# Patient Record
Sex: Female | Born: 1935 | Race: White | Hispanic: No | Smoking: Never smoker
Health system: Southern US, Community
[De-identification: ages and names within clinical notes are randomized; demographics above are authoritative.]

## PROBLEM LIST (undated history)

## (undated) DIAGNOSIS — I10 Essential (primary) hypertension: Secondary | ICD-10-CM

## (undated) DIAGNOSIS — M1990 Unspecified osteoarthritis, unspecified site: Secondary | ICD-10-CM

## (undated) DIAGNOSIS — K279 Peptic ulcer, site unspecified, unspecified as acute or chronic, without hemorrhage or perforation: Secondary | ICD-10-CM

## (undated) DIAGNOSIS — J302 Other seasonal allergic rhinitis: Secondary | ICD-10-CM

## (undated) DIAGNOSIS — M810 Age-related osteoporosis without current pathological fracture: Secondary | ICD-10-CM

## (undated) DIAGNOSIS — R569 Unspecified convulsions: Secondary | ICD-10-CM

## (undated) DIAGNOSIS — N10 Acute tubulo-interstitial nephritis: Secondary | ICD-10-CM

## (undated) DIAGNOSIS — R112 Nausea with vomiting, unspecified: Secondary | ICD-10-CM

## (undated) DIAGNOSIS — Z9889 Other specified postprocedural states: Secondary | ICD-10-CM

## (undated) DIAGNOSIS — E785 Hyperlipidemia, unspecified: Secondary | ICD-10-CM

## (undated) DIAGNOSIS — R252 Cramp and spasm: Secondary | ICD-10-CM

## (undated) DIAGNOSIS — D509 Iron deficiency anemia, unspecified: Secondary | ICD-10-CM

## (undated) DIAGNOSIS — R51 Headache: Secondary | ICD-10-CM

## (undated) DIAGNOSIS — Z9289 Personal history of other medical treatment: Secondary | ICD-10-CM

## (undated) DIAGNOSIS — D519 Vitamin B12 deficiency anemia, unspecified: Secondary | ICD-10-CM

## (undated) DIAGNOSIS — I639 Cerebral infarction, unspecified: Secondary | ICD-10-CM

## (undated) DIAGNOSIS — R519 Headache, unspecified: Secondary | ICD-10-CM

## (undated) DIAGNOSIS — E538 Deficiency of other specified B group vitamins: Secondary | ICD-10-CM

## (undated) HISTORY — PX: JOINT REPLACEMENT: SHX530

## (undated) HISTORY — PX: CATARACT EXTRACTION, BILATERAL: SHX1313

## (undated) HISTORY — DX: Hyperlipidemia, unspecified: E78.5

## (undated) HISTORY — PX: DILATION AND CURETTAGE OF UTERUS: SHX78

## (undated) HISTORY — DX: Peptic ulcer, site unspecified, unspecified as acute or chronic, without hemorrhage or perforation: K27.9

## (undated) HISTORY — DX: Deficiency of other specified B group vitamins: E53.8

## (undated) HISTORY — PX: THYROIDECTOMY, PARTIAL: SHX18

## (undated) HISTORY — DX: Unspecified osteoarthritis, unspecified site: M19.90

---

## 1997-09-30 ENCOUNTER — Other Ambulatory Visit: Admission: RE | Admit: 1997-09-30 | Discharge: 1997-09-30 | Payer: Self-pay | Admitting: Obstetrics and Gynecology

## 1999-01-11 ENCOUNTER — Other Ambulatory Visit: Admission: RE | Admit: 1999-01-11 | Discharge: 1999-01-11 | Payer: Self-pay | Admitting: Obstetrics and Gynecology

## 1999-12-26 ENCOUNTER — Encounter: Payer: Self-pay | Admitting: Obstetrics and Gynecology

## 1999-12-26 ENCOUNTER — Encounter: Admission: RE | Admit: 1999-12-26 | Discharge: 1999-12-26 | Payer: Self-pay | Admitting: Obstetrics and Gynecology

## 2000-02-21 ENCOUNTER — Other Ambulatory Visit: Admission: RE | Admit: 2000-02-21 | Discharge: 2000-02-21 | Payer: Self-pay | Admitting: Obstetrics and Gynecology

## 2001-03-05 ENCOUNTER — Other Ambulatory Visit: Admission: RE | Admit: 2001-03-05 | Discharge: 2001-03-05 | Payer: Self-pay | Admitting: Internal Medicine

## 2001-05-08 DIAGNOSIS — K279 Peptic ulcer, site unspecified, unspecified as acute or chronic, without hemorrhage or perforation: Secondary | ICD-10-CM

## 2001-05-08 HISTORY — DX: Peptic ulcer, site unspecified, unspecified as acute or chronic, without hemorrhage or perforation: K27.9

## 2002-01-16 ENCOUNTER — Inpatient Hospital Stay (HOSPITAL_COMMUNITY): Admission: EM | Admit: 2002-01-16 | Discharge: 2002-01-18 | Payer: Self-pay | Admitting: Emergency Medicine

## 2002-01-16 ENCOUNTER — Encounter: Payer: Self-pay | Admitting: Internal Medicine

## 2002-05-08 DIAGNOSIS — Z9289 Personal history of other medical treatment: Secondary | ICD-10-CM

## 2002-05-08 HISTORY — DX: Personal history of other medical treatment: Z92.89

## 2002-05-08 HISTORY — PX: TOTAL HIP ARTHROPLASTY: SHX124

## 2003-01-20 ENCOUNTER — Encounter: Payer: Self-pay | Admitting: Orthopedic Surgery

## 2003-01-26 ENCOUNTER — Inpatient Hospital Stay (HOSPITAL_COMMUNITY): Admission: RE | Admit: 2003-01-26 | Discharge: 2003-01-30 | Payer: Self-pay | Admitting: Orthopedic Surgery

## 2003-01-26 ENCOUNTER — Encounter: Payer: Self-pay | Admitting: Orthopedic Surgery

## 2004-01-18 ENCOUNTER — Other Ambulatory Visit: Admission: RE | Admit: 2004-01-18 | Discharge: 2004-01-18 | Payer: Self-pay | Admitting: Gynecology

## 2004-03-17 ENCOUNTER — Ambulatory Visit: Payer: Self-pay | Admitting: Gastroenterology

## 2004-03-18 ENCOUNTER — Ambulatory Visit: Payer: Self-pay | Admitting: Gastroenterology

## 2004-08-23 ENCOUNTER — Ambulatory Visit: Payer: Self-pay | Admitting: Internal Medicine

## 2004-08-30 ENCOUNTER — Ambulatory Visit: Payer: Self-pay | Admitting: Internal Medicine

## 2004-09-06 ENCOUNTER — Ambulatory Visit: Payer: Self-pay | Admitting: Internal Medicine

## 2005-01-26 ENCOUNTER — Encounter: Admission: RE | Admit: 2005-01-26 | Discharge: 2005-01-26 | Payer: Self-pay | Admitting: Orthopedic Surgery

## 2005-02-21 ENCOUNTER — Ambulatory Visit: Payer: Self-pay | Admitting: Internal Medicine

## 2005-11-17 ENCOUNTER — Ambulatory Visit: Payer: Self-pay | Admitting: Internal Medicine

## 2006-05-08 HISTORY — PX: ENDOMETRIAL ABLATION: SHX621

## 2006-08-09 ENCOUNTER — Other Ambulatory Visit: Admission: RE | Admit: 2006-08-09 | Discharge: 2006-08-09 | Payer: Self-pay | Admitting: Gynecology

## 2007-01-11 ENCOUNTER — Encounter: Payer: Self-pay | Admitting: Internal Medicine

## 2007-01-11 DIAGNOSIS — K250 Acute gastric ulcer with hemorrhage: Secondary | ICD-10-CM | POA: Insufficient documentation

## 2007-01-11 DIAGNOSIS — E785 Hyperlipidemia, unspecified: Secondary | ICD-10-CM

## 2007-01-11 DIAGNOSIS — D509 Iron deficiency anemia, unspecified: Secondary | ICD-10-CM | POA: Insufficient documentation

## 2007-01-11 DIAGNOSIS — K2980 Duodenitis without bleeding: Secondary | ICD-10-CM | POA: Insufficient documentation

## 2007-03-18 ENCOUNTER — Encounter: Payer: Self-pay | Admitting: Internal Medicine

## 2007-04-26 ENCOUNTER — Ambulatory Visit: Payer: Self-pay | Admitting: Internal Medicine

## 2007-04-26 DIAGNOSIS — K279 Peptic ulcer, site unspecified, unspecified as acute or chronic, without hemorrhage or perforation: Secondary | ICD-10-CM

## 2007-04-26 DIAGNOSIS — M1990 Unspecified osteoarthritis, unspecified site: Secondary | ICD-10-CM

## 2007-04-26 DIAGNOSIS — R51 Headache: Secondary | ICD-10-CM

## 2007-04-26 DIAGNOSIS — E559 Vitamin D deficiency, unspecified: Secondary | ICD-10-CM | POA: Insufficient documentation

## 2007-04-30 ENCOUNTER — Ambulatory Visit: Payer: Self-pay | Admitting: Cardiology

## 2007-04-30 LAB — CONVERTED CEMR LAB
Albumin: 3.7 g/dL (ref 3.5–5.2)
Alkaline Phosphatase: 55 units/L (ref 39–117)
BUN: 11 mg/dL (ref 6–23)
Basophils Absolute: 0 10*3/uL (ref 0.0–0.1)
Bilirubin Urine: NEGATIVE
Cholesterol: 195 mg/dL (ref 0–200)
Eosinophils Absolute: 0.3 10*3/uL (ref 0.0–0.6)
GFR calc non Af Amer: 85 mL/min
HCT: 36.9 % (ref 36.0–46.0)
HDL: 70 mg/dL (ref >39.0)
Hemoglobin, Urine: NEGATIVE
Hemoglobin: 12.8 g/dL (ref 12.0–15.0)
Ketones, ur: NEGATIVE mg/dL
LDL Cholesterol: 113 mg/dL — ABNORMAL HIGH (ref 0–99)
MCHC: 34.6 g/dL (ref 30.0–36.0)
MCV: 90.4 fL (ref 78.0–100.0)
Monocytes Absolute: 0.6 10*3/uL (ref 0.2–0.7)
Monocytes Relative: 10.5 % (ref 3.0–11.0)
Neutrophils Relative %: 63.7 % (ref 43.0–77.0)
Potassium: 4.4 meq/L (ref 3.5–5.1)
RBC: 4.08 M/uL (ref 3.87–5.11)
RDW: 12 % (ref 11.5–14.6)
Sodium: 140 meq/L (ref 135–145)
TSH: 1.89 microintl units/mL (ref 0.35–5.50)
Total CHOL/HDL Ratio: 2.8
Triglycerides: 62 mg/dL (ref 0–149)
Urobilinogen, UA: 0.2 (ref 0.0–1.0)
VLDL: 12 mg/dL (ref 0–40)
pH: 6.5 (ref 5.0–8.0)

## 2007-05-01 ENCOUNTER — Encounter: Payer: Self-pay | Admitting: Internal Medicine

## 2007-08-06 ENCOUNTER — Ambulatory Visit: Payer: Self-pay | Admitting: Internal Medicine

## 2007-08-06 DIAGNOSIS — J309 Allergic rhinitis, unspecified: Secondary | ICD-10-CM | POA: Insufficient documentation

## 2007-08-06 DIAGNOSIS — R0989 Other specified symptoms and signs involving the circulatory and respiratory systems: Secondary | ICD-10-CM | POA: Insufficient documentation

## 2007-08-22 ENCOUNTER — Ambulatory Visit: Payer: Self-pay

## 2007-10-24 ENCOUNTER — Encounter: Payer: Self-pay | Admitting: Internal Medicine

## 2007-11-07 ENCOUNTER — Ambulatory Visit (HOSPITAL_BASED_OUTPATIENT_CLINIC_OR_DEPARTMENT_OTHER): Admission: RE | Admit: 2007-11-07 | Discharge: 2007-11-07 | Payer: Self-pay | Admitting: Gynecology

## 2007-11-07 ENCOUNTER — Encounter (INDEPENDENT_AMBULATORY_CARE_PROVIDER_SITE_OTHER): Payer: Self-pay | Admitting: Gynecology

## 2007-12-18 ENCOUNTER — Telehealth: Payer: Self-pay | Admitting: Internal Medicine

## 2008-02-07 ENCOUNTER — Ambulatory Visit: Payer: Self-pay | Admitting: Internal Medicine

## 2008-02-07 DIAGNOSIS — H5710 Ocular pain, unspecified eye: Secondary | ICD-10-CM | POA: Insufficient documentation

## 2008-02-07 DIAGNOSIS — J329 Chronic sinusitis, unspecified: Secondary | ICD-10-CM

## 2008-02-25 ENCOUNTER — Encounter: Payer: Self-pay | Admitting: Internal Medicine

## 2008-04-07 ENCOUNTER — Ambulatory Visit: Payer: Self-pay | Admitting: Internal Medicine

## 2008-04-07 LAB — CONVERTED CEMR LAB
Basophils Relative: 0.4 % (ref 0.0–3.0)
Eosinophils Relative: 5.7 % — ABNORMAL HIGH (ref 0.0–5.0)
HCT: 35.7 % — ABNORMAL LOW (ref 36.0–46.0)
Hemoglobin: 12.4 g/dL (ref 12.0–15.0)
MCV: 89.7 fL (ref 78.0–100.0)
Monocytes Absolute: 0.6 10*3/uL (ref 0.1–1.0)
Monocytes Relative: 10.3 % (ref 3.0–12.0)
Neutro Abs: 3.9 10*3/uL (ref 1.4–7.7)
RBC: 3.99 M/uL (ref 3.87–5.11)
WBC: 5.9 10*3/uL (ref 4.5–10.5)

## 2008-04-08 ENCOUNTER — Ambulatory Visit: Payer: Self-pay | Admitting: Internal Medicine

## 2008-04-09 ENCOUNTER — Telehealth (INDEPENDENT_AMBULATORY_CARE_PROVIDER_SITE_OTHER): Payer: Self-pay | Admitting: *Deleted

## 2008-07-13 ENCOUNTER — Ambulatory Visit: Payer: Self-pay | Admitting: Internal Medicine

## 2008-08-06 ENCOUNTER — Encounter: Payer: Self-pay | Admitting: Internal Medicine

## 2008-09-11 ENCOUNTER — Ambulatory Visit: Payer: Self-pay | Admitting: Internal Medicine

## 2008-09-11 LAB — CONVERTED CEMR LAB
Albumin: 3.5 g/dL (ref 3.5–5.2)
Alkaline Phosphatase: 65 units/L (ref 39–117)
BUN: 15 mg/dL (ref 6–23)
Basophils Absolute: 0 10*3/uL (ref 0.0–0.1)
Basophils Relative: 0.7 % (ref 0.0–3.0)
CO2: 29 meq/L (ref 19–32)
Calcium: 9.3 mg/dL (ref 8.4–10.5)
Creatinine, Ser: 0.7 mg/dL (ref 0.4–1.2)
Eosinophils Absolute: 0.4 10*3/uL (ref 0.0–0.7)
Glucose, Bld: 87 mg/dL (ref 70–99)
Lymphocytes Relative: 17.3 % (ref 12.0–46.0)
MCHC: 34 g/dL (ref 30.0–36.0)
Neutrophils Relative %: 65.3 % (ref 43.0–77.0)
RBC: 4.14 M/uL (ref 3.87–5.11)
Sodium: 143 meq/L (ref 135–145)
TSH: 2.4 microintl units/mL (ref 0.35–5.50)
Total Protein: 6.9 g/dL (ref 6.0–8.3)
Vit D, 25-Hydroxy: 30 ng/mL (ref 30–89)
Vitamin B-12: 276 pg/mL (ref 211–911)

## 2008-09-16 ENCOUNTER — Ambulatory Visit: Payer: Self-pay | Admitting: Internal Medicine

## 2008-09-16 DIAGNOSIS — E538 Deficiency of other specified B group vitamins: Secondary | ICD-10-CM | POA: Insufficient documentation

## 2009-01-05 ENCOUNTER — Ambulatory Visit: Payer: Self-pay | Admitting: Internal Medicine

## 2009-02-08 ENCOUNTER — Ambulatory Visit: Payer: Self-pay | Admitting: Internal Medicine

## 2009-03-26 ENCOUNTER — Ambulatory Visit: Payer: Self-pay | Admitting: Internal Medicine

## 2009-03-26 LAB — CONVERTED CEMR LAB
Bilirubin, Direct: 0.1 mg/dL (ref 0.0–0.3)
Calcium: 9.4 mg/dL (ref 8.4–10.5)
Creatinine, Ser: 0.7 mg/dL (ref 0.4–1.2)
Total Bilirubin: 0.9 mg/dL (ref 0.3–1.2)
Vit D, 25-Hydroxy: 35 ng/mL (ref 30–89)

## 2009-04-07 ENCOUNTER — Ambulatory Visit: Payer: Self-pay | Admitting: Internal Medicine

## 2009-07-09 ENCOUNTER — Ambulatory Visit: Payer: Self-pay | Admitting: Internal Medicine

## 2009-07-09 DIAGNOSIS — R252 Cramp and spasm: Secondary | ICD-10-CM

## 2009-09-23 ENCOUNTER — Telehealth: Payer: Self-pay | Admitting: Internal Medicine

## 2009-10-08 ENCOUNTER — Ambulatory Visit: Payer: Self-pay | Admitting: Internal Medicine

## 2009-10-08 LAB — CONVERTED CEMR LAB
AST: 18 units/L (ref 0–37)
Albumin: 3.6 g/dL (ref 3.5–5.2)
Basophils Absolute: 0 10*3/uL (ref 0.0–0.1)
CO2: 28 meq/L (ref 19–32)
Chloride: 107 meq/L (ref 96–112)
HDL: 71 mg/dL (ref >39.00)
Hemoglobin: 11.9 g/dL — ABNORMAL LOW (ref 12.0–15.0)
LDL Cholesterol: 81 mg/dL (ref 0–99)
Lymphocytes Relative: 19.3 % (ref 12.0–46.0)
Monocytes Relative: 10.4 % (ref 3.0–12.0)
Neutro Abs: 3.2 10*3/uL (ref 1.4–7.7)
Potassium: 4.3 meq/L (ref 3.5–5.1)
RBC: 3.94 M/uL (ref 3.87–5.11)
RDW: 13.4 % (ref 11.5–14.6)
Sodium: 142 meq/L (ref 135–145)
TSH: 1.97 microintl units/mL (ref 0.35–5.50)
Total CHOL/HDL Ratio: 2
Total CK: 62 units/L (ref 7–177)
Triglycerides: 68 mg/dL (ref 0.0–149.0)

## 2009-11-09 ENCOUNTER — Ambulatory Visit: Payer: Self-pay | Admitting: Internal Medicine

## 2010-01-06 ENCOUNTER — Encounter: Payer: Self-pay | Admitting: Internal Medicine

## 2010-03-22 ENCOUNTER — Ambulatory Visit: Payer: Self-pay | Admitting: Internal Medicine

## 2010-03-22 LAB — CONVERTED CEMR LAB
Basophils Relative: 3.2 % — ABNORMAL HIGH (ref 0.0–3.0)
Eosinophils Relative: 6.5 % — ABNORMAL HIGH (ref 0.0–5.0)
GFR calc non Af Amer: 71.96 mL/min (ref >60)
Glucose, Bld: 93 mg/dL (ref 70–99)
HCT: 36.6 % (ref 36.0–46.0)
Hemoglobin: 12.4 g/dL (ref 12.0–15.0)
Lymphs Abs: 1.4 10*3/uL (ref 0.7–4.0)
Monocytes Relative: 8.9 % (ref 3.0–12.0)
Neutro Abs: 3.1 10*3/uL (ref 1.4–7.7)
Potassium: 4.4 meq/L (ref 3.5–5.1)
RBC: 4.06 M/uL (ref 3.87–5.11)
RDW: 12.8 % (ref 11.5–14.6)
Saturation Ratios: 25.5 % (ref 20.0–50.0)
Sodium: 138 meq/L (ref 135–145)
TSH: 2.37 microintl units/mL (ref 0.35–5.50)
WBC: 5.5 10*3/uL (ref 4.5–10.5)

## 2010-03-24 ENCOUNTER — Ambulatory Visit: Payer: Self-pay | Admitting: Internal Medicine

## 2010-03-25 ENCOUNTER — Ambulatory Visit: Payer: Self-pay | Admitting: Internal Medicine

## 2010-06-07 NOTE — Progress Notes (Signed)
Summary: Refill--Tramadol  Phone Note Refill Request Message from:  Fax from Pharmacy on Sep 23, 2009 10:43 AM  Refills Requested: Medication #1:  TRAMADOL HCL 50 MG  TABS 1 or2 two times a day  as needed  for pain Initial call taken by: Lucious Groves,  Sep 23, 2009 10:43 AM  Follow-up for Phone Call        ok 3 ref Follow-up by: Tresa Garter MD,  Sep 23, 2009 12:47 PM  Additional Follow-up for Phone Call Additional follow up Details #1::        done. Additional Follow-up by: Lucious Groves,  Sep 23, 2009 2:11 PM    Prescriptions: TRAMADOL HCL 50 MG  TABS (TRAMADOL HCL) 1 or2 two times a day  as needed  for pain  #60 x 3   Entered by:   Lucious Groves   Authorized by:   Tresa Garter MD   Signed by:   Lucious Groves on 09/23/2009   Method used:   Telephoned to ...       [facility] [address] (retail)       [address]       [place], [place]  [address]       Ph: [phone]       Fax: [phone]   RxID:   [id number]

## 2010-06-07 NOTE — Assessment & Plan Note (Signed)
Summary: 4 mo fu-oyu   Vital Signs:  Patient profile:   77 year old female Height:      65 inches Weight:      117 pounds BMI:     19.54 Temp:     97.6 degrees F oral Pulse rate:   76 / minute Pulse rhythm:   regular Resp:     16 per minute BP sitting:   130 / 74  (left arm) Cuff size:   regular  Vitals Entered By: Lanier Prude, CMA(AAMA) (March 24, 2010 10:14 AM) CC: 4 mo f/u Is Patient Diabetic? No   CC:  4 mo f/u.  History of Present Illness: The patient presents for a follow up of hypertension, OA, hyperlipidemia.. C/o LBP and leg pain at night   Current Medications (verified): 1)  Celebrex 100 Mg  Caps (Celecoxib) .Marland Kitchen.. 1 Once Daily Pc Prn 2)  Tramadol Hcl 50 Mg  Tabs (Tramadol Hcl) .Marland Kitchen.. 1 Or2 Two Times A Day  As Needed  For Pain 3)  Hydrocodone-Ibuprofen 7.5-200 Mg  Tabs (Hydrocodone-Ibuprofen) .... As Needed 4)  Zocor 40 Mg  Tabs (Simvastatin) .... Take 1 By Mouth Once Daily 6 Month Follow-Up Is Due in Sept. No Addtioanl Refills Until Appt 5)  Aspirin 325 Mg Tabs (Aspirin) .... 1/2    Once Daily 6)  Loratadine 10 Mg  Tabs (Loratadine) .... Once Daily 7)  Fluticasone Propionate 50 Mcg/act  Susp (Fluticasone Propionate) .... 2 Sprays Each Nostril Once Daily 8)  B-12 500 Mcg Tabs (Cyanocobalamin) .Marland Kitchen.. 1 By Mouth Qod 9)  Vitamin D3 1000 Unit  Tabs (Cholecalciferol) .... 2 Once Daily By Mouth 10)  Prevacid 30 Mg Cpdr (Lansoprazole) .... Once Daily 11)  Omega-3 1000 Mg Caps (Omega-3 Fatty Acids) .... Once Daily  Allergies (verified): No Known Drug Allergies  Past History:  Past Medical History: Last updated: 09/16/2008 Anemia-iron deficiency Hyperlipidemia Osteoarthritis   Dr Despina Hick Peptic ulcer disease 2003 Vit D def gyn Dr Nicholas Lose Allergic rhinitis Chronic sinusitis Vit B12 def  Social History: Last updated: 11/09/2009 Retired Married Never Smoked  Review of Systems  The patient denies vision loss, chest pain, syncope, and abdominal pain.     Physical Exam  General:  Well-developed,well-nourished,in no acute distress; alert,appropriate and cooperative throughout examination Nose:  External nasal examination shows no deformity or inflammation. Nasal mucosa are pink and moist without lesions or exudates. Mouth:  Oral mucosa and oropharynx without lesions or exudates.  Teeth in good repair. Neck:  No deformities, masses, or tenderness noted. Lungs:  Normal respiratory effort, chest expands symmetrically. Lungs are clear to auscultation, no crackles or wheezes. Heart:  Normal rate and regular rhythm. S1 and S2 normal without gallop, murmur, click, rub or other extra sounds. Abdomen:  Bowel sounds positive,abdomen soft and non-tender without masses, organomegaly or hernias noted. Msk:  No deformity or scoliosis noted of thoracic or lumbar spine.   Neurologic:  alert & oriented X3.   Skin:  Intact without suspicious lesions or rashes Psych:  Cognition and judgment appear intact. Alert and cooperative with normal attention span and concentration. No apparent delusions, illusions, hallucinations   Impression & Recommendations:  Problem # 1:  OSTEOARTHRITIS (ICD-715.90) Assessment Deteriorated Hold Simvastatin Her updated medication list for this problem includes:    Celebrex 100 Mg Caps (Celecoxib) .Marland Kitchen... 1 once daily pc prn    Tramadol Hcl 50 Mg Tabs (Tramadol hcl) .Marland Kitchen... 1 or2 two times a day  as needed  for pain    Hydrocodone-ibuprofen  7.5-200 Mg Tabs (Hydrocodone-ibuprofen) .Marland Kitchen... As needed    Aspirin 325 Mg Tabs (Aspirin) .Marland Kitchen... 1/2    once daily  Problem # 2:  VITAMIN B12 DEFICIENCY (ICD-266.2)  Problem # 3:  HYPERLIPIDEMIA (ICD-272.4) Assessment: Unchanged  Her updated medication list for this problem includes:    Zocor 40 Mg Tabs (Simvastatin) .Marland Kitchen... Take 1 by mouth once daily 6 month follow-up is due in sept. no addtioanl refills until appt  Problem # 4:  ANEMIA-IRON DEFICIENCY (ICD-280.9)  Her updated medication list  for this problem includes:    B-12 500 Mcg Tabs (Cyanocobalamin) .Marland Kitchen... 1 by mouth qod  Complete Medication List: 1)  Celebrex 100 Mg Caps (Celecoxib) .Marland Kitchen.. 1 once daily pc prn 2)  Tramadol Hcl 50 Mg Tabs (Tramadol hcl) .Marland Kitchen.. 1 or2 two times a day  as needed  for pain 3)  Hydrocodone-ibuprofen 7.5-200 Mg Tabs (Hydrocodone-ibuprofen) .... As needed 4)  Zocor 40 Mg Tabs (Simvastatin) .... Take 1 by mouth once daily 6 month follow-up is due in sept. no addtioanl refills until appt 5)  Aspirin 325 Mg Tabs (Aspirin) .... 1/2    once daily 6)  Loratadine 10 Mg Tabs (Loratadine) .... Once daily 7)  Fluticasone Propionate 50 Mcg/act Susp (Fluticasone propionate) .... 2 sprays each nostril once daily 8)  B-12 500 Mcg Tabs (Cyanocobalamin) .Marland Kitchen.. 1 by mouth qod 9)  Vitamin D3 1000 Unit Tabs (Cholecalciferol) .... 2 once daily by mouth 10)  Prevacid 30 Mg Cpdr (Lansoprazole) .... Once daily 11)  Omega-3 1000 Mg Caps (Omega-3 fatty acids) .... Once daily  Patient Instructions: 1)  Please schedule a follow-up appointment in 4 months well w/labs.   Orders Added: 1)  Est. Patient Level IV [91478]

## 2010-06-07 NOTE — Assessment & Plan Note (Signed)
Summary: 3 MO ROV /NWS  #   Vital Signs:  Patient profile:   77 year old female Weight:      123 pounds Temp:     97.4 degrees F oral Pulse rate:   97 / minute BP sitting:   142 / 68  (left arm)  Vitals Entered By: Tora Perches (July 09, 2009 1:13 PM) CC: f/u Is Patient Diabetic? No   CC:  f/u.  History of Present Illness: The patient presents for a follow up of hypertension, allergies, hyperlipidemia   Preventive Screening-Counseling & Management  Alcohol-Tobacco     Smoking Status: never  Current Medications (verified): 1)  Celebrex 100 Mg  Caps (Celecoxib) .Marland Kitchen.. 1 Once Daily Pc Prn 2)  Tramadol Hcl 50 Mg  Tabs (Tramadol Hcl) .Marland Kitchen.. 1 Or2 Two Times A Day  As Needed  For Pain 3)  Hydrocodone-Ibuprofen 7.5-200 Mg  Tabs (Hydrocodone-Ibuprofen) .... As Needed 4)  Zocor 40 Mg  Tabs (Simvastatin) .... Take 1 By Mouth Once Daily 6 Month Follow-Up Is Due in Sept. No Addtioanl Refills Until Appt 5)  Aspirin 325 Mg Tabs (Aspirin) .... 1/2    Once Daily 6)  Loratadine 10 Mg  Tabs (Loratadine) .... Once Daily 7)  Fluticasone Propionate 50 Mcg/act  Susp (Fluticasone Propionate) .... 2 Sprays Each Nostril Once Daily 8)  B-12 500 Mcg Tabs (Cyanocobalamin) .Marland Kitchen.. 1 By Mouth Qod 9)  Vitamin D3 1000 Unit  Tabs (Cholecalciferol) .... 2 Once Daily By Mouth 10)  Prevacid 30 Mg Cpdr (Lansoprazole) .... Once Daily 11)  Omega-3 1000 Mg Caps (Omega-3 Fatty Acids) .... Once Daily  Allergies (verified): No Known Drug Allergies  Past History:  Past Medical History: Last updated: 09/16/2008 Anemia-iron deficiency Hyperlipidemia Osteoarthritis   Dr Despina Hick Peptic ulcer disease 2003 Vit D def gyn Dr Nicholas Lose Allergic rhinitis Chronic sinusitis Vit B12 def  Family History: Last updated: 04/26/2007 Family History Hypertension  Social History: Last updated: 04/26/2007 Retired Married Never Smoked  Physical Exam  General:  Well-developed,well-nourished,in no acute distress;  alert,appropriate and cooperative throughout examination Nose:  External nasal examination shows no deformity or inflammation. Nasal mucosa are pink and moist without lesions or exudates. Mouth:  Oral mucosa and oropharynx without lesions or exudates.  Teeth in good repair. Lungs:  Normal respiratory effort, chest expands symmetrically. Lungs are clear to auscultation, no crackles or wheezes. Heart:  Normal rate and regular rhythm. S1 and S2 normal without gallop, murmur, click, rub or other extra sounds. Abdomen:  Bowel sounds positive,abdomen soft and non-tender without masses, organomegaly or hernias noted. Msk:  No deformity or scoliosis noted of thoracic or lumbar spine.   Neurologic:  alert & oriented X3.   Skin:  Intact without suspicious lesions or rashes Psych:  Cognition and judgment appear intact. Alert and cooperative with normal attention span and concentration. No apparent delusions, illusions, hallucinations   Impression & Recommendations:  Problem # 1:  VITAMIN B12 DEFICIENCY (ICD-266.2) Assessment Improved On prescription therapy   Problem # 2:  VITAMIN D DEFICIENCY (ICD-268.9) Assessment: Improved On prescription therapy   Problem # 3:  CRAMP OF LIMB (ICD-729.82) Assessment: New Hold Simvastatin  Problem # 4:  HYPERLIPIDEMIA (ICD-272.4) Assessment: Improved  Her updated medication list for this problem includes:    Zocor 40 Mg Tabs (Simvastatin) .Marland Kitchen... Take 1 by mouth once daily 6 month follow-up is due in sept. no addtioanl refills until appt  Complete Medication List: 1)  Celebrex 100 Mg Caps (Celecoxib) .Marland Kitchen.. 1 once daily  pc prn 2)  Tramadol Hcl 50 Mg Tabs (Tramadol hcl) .Marland Kitchen.. 1 or2 two times a day  as needed  for pain 3)  Hydrocodone-ibuprofen 7.5-200 Mg Tabs (Hydrocodone-ibuprofen) .... As needed 4)  Zocor 40 Mg Tabs (Simvastatin) .... Take 1 by mouth once daily 6 month follow-up is due in sept. no addtioanl refills until appt 5)  Aspirin 325 Mg Tabs (Aspirin)  .... 1/2    once daily 6)  Loratadine 10 Mg Tabs (Loratadine) .... Once daily 7)  Fluticasone Propionate 50 Mcg/act Susp (Fluticasone propionate) .... 2 sprays each nostril once daily 8)  B-12 500 Mcg Tabs (Cyanocobalamin) .Marland Kitchen.. 1 by mouth qod 9)  Vitamin D3 1000 Unit Tabs (Cholecalciferol) .... 2 once daily by mouth 10)  Prevacid 30 Mg Cpdr (Lansoprazole) .... Once daily 11)  Omega-3 1000 Mg Caps (Omega-3 fatty acids) .... Once daily  Patient Instructions: 1)  Please schedule a follow-up appointment in 3 months. 2)  BMP prior to visit, ICD-9: 3)  Hepatic Panel prior to visit, ICD-9: 4)  Lipid Panel prior to visit, ICD-9: 5)  TSH prior to visit, ICD-9: 6)  CBC w/ Diff prior to visit, ICD-9: 995.20 272.0 729.5 7)  B12 266.20  8)  CK

## 2010-06-07 NOTE — Assessment & Plan Note (Signed)
Summary: 3 MO ROV /NWS  #   Vital Signs:  Patient profile:   77 year old female Weight:      116 pounds (52.73 kg) Temp:     97.7 degrees F (36.50 degrees C) oral Pulse rate:   72 / minute Pulse rhythm:   regular Resp:     16 per minute BP sitting:   140 / 90  (left arm) Cuff size:   regular  Vitals Entered By: Lanier Prude, CMA(AAMA) (November 09, 2009 11:18 AM) CC: 3 mo Is Patient Diabetic? No Pain Assessment Patient in pain? no        CC:  3 mo.  History of Present Illness: The patient presents for a follow up of OA, hyperlipidemia, allergy, B12 def   Current Medications (verified): 1)  Celebrex 100 Mg  Caps (Celecoxib) .Marland Kitchen.. 1 Once Daily Pc Prn 2)  Tramadol Hcl 50 Mg  Tabs (Tramadol Hcl) .Marland Kitchen.. 1 Or2 Two Times A Day  As Needed  For Pain 3)  Hydrocodone-Ibuprofen 7.5-200 Mg  Tabs (Hydrocodone-Ibuprofen) .... As Needed 4)  Zocor 40 Mg  Tabs (Simvastatin) .... Take 1 By Mouth Once Daily 6 Month Follow-Up Is Due in Sept. No Addtioanl Refills Until Appt 5)  Aspirin 325 Mg Tabs (Aspirin) .... 1/2    Once Daily 6)  Loratadine 10 Mg  Tabs (Loratadine) .... Once Daily 7)  Fluticasone Propionate 50 Mcg/act  Susp (Fluticasone Propionate) .... 2 Sprays Each Nostril Once Daily 8)  B-12 500 Mcg Tabs (Cyanocobalamin) .Marland Kitchen.. 1 By Mouth Qod 9)  Vitamin D3 1000 Unit  Tabs (Cholecalciferol) .... 2 Once Daily By Mouth 10)  Prevacid 30 Mg Cpdr (Lansoprazole) .... Once Daily 11)  Omega-3 1000 Mg Caps (Omega-3 Fatty Acids) .... Once Daily  Allergies (verified): No Known Drug Allergies  Past History:  Past Medical History: Last updated: 09/16/2008 Anemia-iron deficiency Hyperlipidemia Osteoarthritis   Dr Despina Hick Peptic ulcer disease 2003 Vit D def gyn Dr Nicholas Lose Allergic rhinitis Chronic sinusitis Vit B12 def  Social History: Last updated: 11/09/2009 Retired Married Never Smoked  Social History: Reviewed history from 04/26/2007 and no changes required. Retired Married Never  Smoked  Review of Systems  The patient denies fever, weight gain, dyspnea on exertion, hemoptysis, and abdominal pain.    Physical Exam  General:  Well-developed,well-nourished,in no acute distress; alert,appropriate and cooperative throughout examination Eyes:  No corneal or conjunctival inflammation noted. EOMI. Perrla. Mouth:  Oral mucosa and oropharynx without lesions or exudates.  Teeth in good repair. Neck:  No deformities, masses, or tenderness noted. Lungs:  Normal respiratory effort, chest expands symmetrically. Lungs are clear to auscultation, no crackles or wheezes. Heart:  Normal rate and regular rhythm. S1 and S2 normal without gallop, murmur, click, rub or other extra sounds. Abdomen:  Bowel sounds positive,abdomen soft and non-tender without masses, organomegaly or hernias noted. Msk:  No deformity or scoliosis noted of thoracic or lumbar spine.   Neurologic:  alert & oriented X3.   Skin:  Intact without suspicious lesions or rashes Psych:  Cognition and judgment appear intact. Alert and cooperative with normal attention span and concentration. No apparent delusions, illusions, hallucinations   Impression & Recommendations:  Problem # 1:  HYPERLIPIDEMIA (ICD-272.4) Assessment Unchanged  Her updated medication list for this problem includes:    Zocor 40 Mg Tabs (Simvastatin) .Marland Kitchen... Take 1 by mouth once daily 6 month follow-up is due in sept. no addtioanl refills until appt  Labs Reviewed: SGOT: 18 (10/08/2009)   SGPT:  13 (10/08/2009)   HDL:71.00 (10/08/2009), 70.0 (04/26/2007)  LDL:81 (10/08/2009), 113 (04/54/0981)  Chol:166 (10/08/2009), 195 (04/26/2007)  Trig:68.0 (10/08/2009), 62 (04/26/2007)  Problem # 2:  ALLERGIC RHINITIS (ICD-477.9) Assessment: Deteriorated  Her updated medication list for this problem includes:    Loratadine 10 Mg Tabs (Loratadine) ..... Once daily    Fluticasone Propionate 50 Mcg/act Susp (Fluticasone propionate) .Marland Kitchen... 2 sprays each nostril  once daily - restart  Orders: Prescription Created Electronically [phone])  Problem # 3:  VITAMIN B12 DEFICIENCY (ICD-266.2) Assessment: Comment Only On prescription drug  therapy   Problem # 4:  VITAMIN D DEFICIENCY (ICD-268.9) Assessment: Comment Only On prescription drug  therapy   Problem # 5:  ANEMIA-IRON DEFICIENCY (ICD-280.9) Assessment: Unchanged  Her updated medication list for this problem includes:    B-12 500 Mcg Tabs (Cyanocobalamin) .Marland Kitchen... 1 by mouth qod  Complete Medication List: 1)  Celebrex 100 Mg Caps (Celecoxib) .Marland Kitchen.. 1 once daily pc prn 2)  Tramadol Hcl 50 Mg Tabs (Tramadol hcl) .Marland Kitchen.. 1 or2 two times a day  as needed  for pain 3)  Hydrocodone-ibuprofen 7.5-200 Mg Tabs (Hydrocodone-ibuprofen) .... As needed 4)  Zocor 40 Mg Tabs (Simvastatin) .... Take 1 by mouth once daily 6 month follow-up is due in sept. no addtioanl refills until appt 5)  Aspirin 325 Mg Tabs (Aspirin) .... 1/2    once daily 6)  Loratadine 10 Mg Tabs (Loratadine) .... Once daily 7)  Fluticasone Propionate 50 Mcg/act Susp (Fluticasone propionate) .... 2 sprays each nostril once daily 8)  B-12 500 Mcg Tabs (Cyanocobalamin) .Marland Kitchen.. 1 by mouth qod 9)  Vitamin D3 1000 Unit Tabs (Cholecalciferol) .... 2 once daily by mouth 10)  Prevacid 30 Mg Cpdr (Lansoprazole) .... Once daily 11)  Omega-3 1000 Mg Caps (Omega-3 fatty acids) .... Once daily  Patient Instructions: 1)  Please schedule a follow-up appointment in 4 months. 2)  BMP prior to visit, ICD-9: 3)  CBC w/ Diff prior to visit, ICD-9: 4)  Iron/TIBc 280.9 Prescriptions: FLUTICASONE PROPIONATE 50 MCG/ACT  SUSP (FLUTICASONE PROPIONATE) 2 sprays each nostril once daily  #16 Gram x 6   Entered and Authorized by:   Tresa Garter MD   Signed by:   Tresa Garter MD on 11/09/2009   Method used:   Print then Give to Patient   RxID:   [id number]

## 2010-06-07 NOTE — Assessment & Plan Note (Signed)
Summary: per stacey flu vac avp  stc  Nurse Visit   Vitals Entered By: Lamar Sprinkles, CMA (March 25, 2010 11:35 AM)  Allergies: No Known Drug Allergies  Immunizations Administered:  Influenza Vaccine # 1:    Vaccine Type: Fluvax MCR    Site: right deltoid    Mfr: Sanofi Pasteur    Dose: 0.5 ml    Route: IM    Given by: Lamar Sprinkles, CMA    Exp. Date: 11/05/2010    Lot #: [id number]    VIS given: 11/30/09 version given March 25, 2010.  Orders Added: 1)  Flu Vaccine 58yrs + MEDICARE PATIENTS [Q2039] 2)  Administration Flu vaccine - MCR [G0008]

## 2010-06-07 NOTE — Letter (Signed)
Summary: Beather Arbour MD  Beather Arbour MD   Imported By: Lennie Odor 01/18/2010 11:35:18  _____________________________________________________________________  External Attachment:    Type:   Image     Comment:   External Document

## 2010-07-20 ENCOUNTER — Other Ambulatory Visit: Payer: Self-pay

## 2010-07-22 ENCOUNTER — Encounter (INDEPENDENT_AMBULATORY_CARE_PROVIDER_SITE_OTHER): Payer: Self-pay | Admitting: *Deleted

## 2010-07-22 ENCOUNTER — Other Ambulatory Visit: Payer: Medicare Other

## 2010-07-22 ENCOUNTER — Other Ambulatory Visit: Payer: Self-pay | Admitting: Internal Medicine

## 2010-07-22 DIAGNOSIS — E785 Hyperlipidemia, unspecified: Secondary | ICD-10-CM

## 2010-07-22 DIAGNOSIS — N3000 Acute cystitis without hematuria: Secondary | ICD-10-CM

## 2010-07-22 DIAGNOSIS — Z0000 Encounter for general adult medical examination without abnormal findings: Secondary | ICD-10-CM

## 2010-07-22 DIAGNOSIS — K279 Peptic ulcer, site unspecified, unspecified as acute or chronic, without hemorrhage or perforation: Secondary | ICD-10-CM

## 2010-07-22 DIAGNOSIS — Z136 Encounter for screening for cardiovascular disorders: Secondary | ICD-10-CM

## 2010-07-22 LAB — CBC WITH DIFFERENTIAL/PLATELET
Eosinophils Relative: 8.2 % — ABNORMAL HIGH (ref 0.0–5.0)
HCT: 36 % (ref 36.0–46.0)
Hemoglobin: 12.2 g/dL (ref 12.0–15.0)
Lymphocytes Relative: 22.9 % (ref 12.0–46.0)
Lymphs Abs: 1.4 10*3/uL (ref 0.7–4.0)
Monocytes Relative: 10.8 % (ref 3.0–12.0)
Neutro Abs: 3.6 10*3/uL (ref 1.4–7.7)
WBC: 6.2 10*3/uL (ref 4.5–10.5)

## 2010-07-22 LAB — URINALYSIS, ROUTINE W REFLEX MICROSCOPIC
Bilirubin Urine: NEGATIVE
Nitrite: NEGATIVE
Specific Gravity, Urine: 1.025 (ref 1.000–1.030)
Total Protein, Urine: NEGATIVE
pH: 5.5 (ref 5.0–8.0)

## 2010-07-22 LAB — LIPID PANEL
Total CHOL/HDL Ratio: 3
Triglycerides: 63 mg/dL (ref 0.0–149.0)

## 2010-07-22 LAB — BASIC METABOLIC PANEL
CO2: 30 mEq/L (ref 19–32)
Calcium: 9.2 mg/dL (ref 8.4–10.5)
Chloride: 105 mEq/L (ref 96–112)
Creatinine, Ser: 0.7 mg/dL (ref 0.4–1.2)
Glucose, Bld: 79 mg/dL (ref 70–99)
Sodium: 139 mEq/L (ref 135–145)

## 2010-07-22 LAB — HEPATIC FUNCTION PANEL
ALT: 9 U/L (ref 0–35)
Albumin: 3.5 g/dL (ref 3.5–5.2)
Alkaline Phosphatase: 47 U/L (ref 39–117)
Bilirubin, Direct: 0.1 mg/dL (ref 0.0–0.3)
Total Protein: 6.4 g/dL (ref 6.0–8.3)

## 2010-07-27 ENCOUNTER — Ambulatory Visit (INDEPENDENT_AMBULATORY_CARE_PROVIDER_SITE_OTHER): Payer: Medicare Other | Admitting: Internal Medicine

## 2010-07-27 ENCOUNTER — Encounter: Payer: Self-pay | Admitting: Internal Medicine

## 2010-07-27 DIAGNOSIS — M1990 Unspecified osteoarthritis, unspecified site: Secondary | ICD-10-CM

## 2010-07-27 DIAGNOSIS — N3000 Acute cystitis without hematuria: Secondary | ICD-10-CM

## 2010-07-27 DIAGNOSIS — E785 Hyperlipidemia, unspecified: Secondary | ICD-10-CM

## 2010-07-27 DIAGNOSIS — E538 Deficiency of other specified B group vitamins: Secondary | ICD-10-CM

## 2010-07-27 DIAGNOSIS — K279 Peptic ulcer, site unspecified, unspecified as acute or chronic, without hemorrhage or perforation: Secondary | ICD-10-CM

## 2010-07-27 DIAGNOSIS — E782 Mixed hyperlipidemia: Secondary | ICD-10-CM

## 2010-07-27 DIAGNOSIS — R252 Cramp and spasm: Secondary | ICD-10-CM

## 2010-07-27 MED ORDER — TRAMADOL HCL 50 MG PO TABS: 50.0000 mg | ORAL_TABLET | Freq: Two times a day (BID) | ORAL | Status: DC | PRN

## 2010-07-27 MED ORDER — CIPROFLOXACIN HCL 250 MG PO TABS: 250.0000 mg | ORAL_TABLET | Freq: Two times a day (BID) | ORAL | Status: DC

## 2010-07-28 NOTE — Assessment & Plan Note (Signed)
Cont Rx 

## 2010-07-28 NOTE — Progress Notes (Signed)
  Subjective:    Patient ID: Theresa Hampton, female    DOB: [birth year], 76 y.o.   MRN: [id number]  HPI  The patient presents for a follow-up visit to check on  OA, hyperlipidemia, PUD   Review of Systems  Constitutional: Negative for appetite change and fatigue.  HENT: Negative for congestion.   Respiratory: Negative for cough.   Gastrointestinal: Negative for blood in stool.  Genitourinary: Negative for dysuria.  Neurological: Negative for facial asymmetry.  Psychiatric/Behavioral: The patient is not hyperactive.        Objective:   Physical Exam  Constitutional: She appears well-developed and well-nourished.  HENT:  Head: Normocephalic.  Eyes: Pupils are equal, round, and reactive to light. Left eye exhibits no discharge.  Pulmonary/Chest: No respiratory distress. She has no rales.  Abdominal: Soft. She exhibits no distension.  Lymphadenopathy:    She has no cervical adenopathy.  Skin: No rash noted.  Psychiatric: Her behavior is normal. Judgment normal.          Assessment & Plan:  VITAMIN B12 DEFICIENCY Cont Rx  PEPTIC ULCER DISEASE Cont Rx  OSTEOARTHRITIS Cont Rx.  HYPERLIPIDEMIA Cont Rx

## 2010-07-30 ENCOUNTER — Other Ambulatory Visit: Payer: Self-pay | Admitting: Internal Medicine

## 2010-08-01 NOTE — Telephone Encounter (Signed)
Please advise, ok for rf?

## 2010-08-03 NOTE — Telephone Encounter (Signed)
OK to fill this prescription with additional refills x3 Thank you!  

## 2010-09-20 NOTE — Op Note (Signed)
NAMECORENA, TILSON              ACCOUNT NO.:  [id number]   MEDICAL RECORD NO.:  [id number]          PATIENT TYPE:  AMB   LOCATION:  [facility]                         FACILITY:  [facility]   PHYSICIAN:  Gretta Cool, M.D. DATE OF BIRTH:  10/04/1919   DATE OF PROCEDURE:  11/07/2007  DATE OF DISCHARGE:                               OPERATIVE REPORT   PREOPERATIVE DIAGNOSIS:  Simple endometrial hyperplasia with atypia.   POSTOPERATIVE DIAGNOSIS:  Simple endometrial hyperplasia with atypia.   PROCEDURE:  1. Hysteroscopy resection of the endometrium.  2. Total plus VapoTrode ablation.   DESCRIPTION OF PROCEDURE:  Under excellent IV sedation with paracervical  block anesthesia, the cervix was grasped and dilated to accommodate the  7 mm resectoscope.  The cavity was then examined and there was no  evidence of polyp formation.  There was slight thickening of endometrium  for the patient's age on both anterior and posterior endometrial uterine  walls.  The cornual areas were first resected, then the entire  endometrial cavity was resected down into the muscle area of the uterus  -- for a depth of approximately 5 mm.  Once the entire endometrium was  excised, the cavity was then treated with the VapoTrode electrode so as  to eliminate any islands of superficial myometrium.  The cornual areas  were treated by touch technique.   At this point, the pressure was reduced and the bleeding points were  treated by cautery.  The patient was then returned to the recovery room  in excellent condition.   FLUID DEFICIT:  Approximately 200 mL.   COMPLICATIONS:  None.           ______________________________  Gretta Cool, M.D.     CWL/MEDQ  D:  11/07/2007  T:  11/07/2007  Job:  [id number]

## 2010-09-23 NOTE — H&P (Signed)
Theresa Hampton, Theresa Hampton                         ACCOUNT NO.:  [id number]   MEDICAL RECORD NO.:  [id number]                   PATIENT TYPE:   LOCATION:                                       FACILITY:   PHYSICIAN:  Georgina Quint. Plotnikov, M.D. [facility]      DATE OF BIRTH:  03-13-1920   DATE OF ADMISSION:  DATE OF DISCHARGE:                                HISTORY & PHYSICAL   CHIEF COMPLAINT:  Weak, passed out once, had black stool.   HISTORY OF PRESENT ILLNESS:  The patient is a 77 year old female who  presents with the above symptoms that developed on September 9.  She passed  out in the bathroom, hit her head a little bit on the right side.  She felt  dizzy and light-headed prior to it.  She has been nauseated for several days  without abdominal pain.  Has been feeling weak.  Admits to one black stool  two days ago.  No recurrence.  Denies chest pain.  Admits to some  palpitations.   PAST MEDICAL HISTORY:  Arthritis, osteoporosis, menopause.   ALLERGIES:  None.   PAST SURGICAL HISTORY:  Thyroidectomy, polypectomy, colonoscopy.   CURRENT MEDICATIONS:  She was on a hormone patch recently which she  discontinued.  She takes Celebrex occasionally.  Fosamax weekly.   SOCIAL HISTORY:  She is married.  Has grown children.  Does not smoke.   FAMILY HISTORY:  Father has a mild age of 23 and colon cancer.  Mother died  age of 5 with old age.   REVIEW OF SYMPTOMS:  Palpitations as above.  Negative for chest pain.  No  abdominal complaints except for one black stool.  No syncope previously.  The rest is negative.   PHYSICAL EXAMINATION:  GENERAL:  She appears mildly ill.  She is in no acute  distress.  She is pale.  VITAL SIGNS:  Blood pressure 110/74, pulse 128, temperature 99.  HEENT:  Moist mucosa.  NECK:  Supple.  No thyromegaly or bruit.  LUNGS:  Clear breath sounds.  No wheezes or rales.  HEART:  S1, S2.  No ___________ to percussion.  Tachycardic.  No gallop.  ABDOMEN:  Soft,  nontender.  No organomegaly or mass.  EXTREMITIES:  Lower extremities without edema.  NEUROLOGIC:  Cranial nerves II-XII normal.  __________ strength normal.  She  is alert and cooperative and has been depressed.  SKIN:  She has mild ecchymosis from the right cheekbone.   LABORATORIES:  EKG with sinus tachycardia, heart rate 120.   PROCEDURE:  Anoscopy.   INDICATIONS:  Melena.   PROCEDURE:  She was placed in the right decubitus position.  Digital  examination reveals dark stool which tested guaiac positive.  No masses,  some external hemorrhoids.  Anoscope was introduced without difficulty.  Upon withdrawal careful look at the  mucosa was obtained.  Some internal  hemorrhoids were observed.  No bleeding.   COMPLICATIONS:  None.  IMPRESSION:  Internal hemorrhoids, guaiac positive black stool.   ASSESSMENT/PLAN:  1. Syncope likely due to orthostatic hypotension due to probable     gastrointestinal bleeding.  Admit to telemetry.  Obtain CPKs x3, troponin     x3, EKG in the morning.  2. Melena likely due to upper gastrointestinal bleed.  She had a colonoscopy     a couple years ago.  Obtain gastrointestinal consult today.  Start on     Protonix 40 mg q.d.  Obtain CBC __________.  3. Weakness.  Plan as above.  Intravenous fluids starting with normal     saline.  4. Tachycardia.  Plan as above.                                               Georgina Quint. Plotnikov, M.D. [facility]    AVP/MEDQ  D:  01/16/2002  T:  01/16/2002  Job:  [id number]   cc:   Ulyess Mort, M.D. [facility]   Gretta Cool, M.D.  [address] [place]  [place] [address]  Fax: [phone]

## 2010-09-23 NOTE — Op Note (Signed)
NAME:  Theresa Hampton, Theresa Hampton                        ACCOUNT NO.:  [id number]   MEDICAL RECORD NO.:  [id number]                   PATIENT TYPE:  INP   LOCATION:  0008                                 FACILITY:  [facility]   PHYSICIAN:  Ollen Gross, M.D.                 DATE OF BIRTH:  [birth year]   DATE OF PROCEDURE:  01/26/2003  DATE OF DISCHARGE:                                 OPERATIVE REPORT   PREOPERATIVE DIAGNOSIS:  Osteoarthritis, right hip.   POSTOPERATIVE DIAGNOSIS:  Osteoarthritis, right hip.   PROCEDURE:  Right total hip arthroplasty.   SURGEON:  Ollen Gross, M.D.   ASSISTANT:  Alexzandrew L. Julien Girt, P.A.   ANESTHESIA:  Spinal.   ESTIMATED BLOOD LOSS:  600.   DRAINS:  Hemovac x1.   COMPLICATIONS:  None.   CONDITION:  Stable to recovery.   CLINICAL NOTE:  Theresa Hampton is an 77 year old female with end-stage  osteoarthritis of the right hip with pain refractory to nonoperative  management.  She presents now for a right total hip arthroplasty.   PROCEDURE IN DETAIL:  After the successful administration of spinal  anesthetic, the patient is placed in the left lateral decubitus position  with the right side up and held with the hip positioner.  The right lower  extremity was isolated from the perineum with plastic drapes and prepped and  draped in the usual sterile fashion.  A mini-posterolateral incision was  made with a 10 blade through subcutaneous tissue to the level of the fascia  lata, which is incised in line with the skin incision.  The sciatic nerve is  palpated and protected and the short rotators isolated off the femur.  Capsulectomy is performed and the hip is dislocated.  The center of the  femoral head is marked and a trial prosthesis is placed such that the center  of the trial head corresponds to the center of her native femoral head.  Osteotomy lines are marked on the femoral neck and osteotomy is made with an  oscillating saw.   The femur is  retracted anteriorly and an acetabular exposure obtained.  The  anterior labrum and capsule were removed and then osteophytes removed.  Reaming started with a 47, coursing in increments to the 51, then a 52 mm  Pinnacle acetabular shell is placed in anatomic position and transfixed with  two domed screws.  The trial 32 neutral liner is placed.   Femoral preparation is initiated with the canal finder and irrigation.  I  broached up to a size 1, then placed a size 2 high-offset neck on the broach  with a 3 mm +1 head.  The hip is reduced with outstanding stability, full  extension, flexion, and rotation, 70 degrees flexion and 40 degrees  adduction, 90 degrees internal rotation, and 90 degrees flexion and 70  degrees internal rotation.  I placed the right leg over the left and it  felt  as though leg lengths were equal.  The hip is dislocated, all trials  removed, and the permanent apex hole eliminator and permanent 32 mm neutral  Marathon liner were placed.  I trialled the cement restrictor size, and a 3  was most appropriate.  I placed a size 3 cement restrictor at the  appropriate depth in the femoral canal.  The canal was then thoroughly  irrigated with the pulsatile lavage and cement mixed.  Once ready for  implantation, the cement is injected into the canal and pressurized.  A size  2 Endurance Luster high-offset stem is placed matching her native  anteversion.  Once the cement is fully hardened, then we placed a 32 +1 head  and reduced the hip.  She had outstanding stability throughout just like we  had on the trial.  The wound was then copiously irrigated with antibiotic  solution and short rotators reattached to the femur with drill holes.  The  fascia lata is closed over a Hemovac drain with interrupted #1 Vicryl, the  subcu closed with #1 and 2-0 Vicryl, and  subcuticular running 4-0 Monocryl.  Incision was cleaned and dried and Steri-Strips and a bulky sterile dressing  applied.   The drain is hooked to suction.  She is placed into a knee  immobilizer, awakened, and transported to recovery in stable condition.                                               Ollen Gross, M.D.    FA/MEDQ  D:  01/26/2003  T:  01/26/2003  Job:  [id number]

## 2010-09-23 NOTE — Discharge Summary (Signed)
NAME:  Theresa Hampton, Theresa Hampton                        ACCOUNT NO.:  [id number]   MEDICAL RECORD NO.:  [id number]                   PATIENT TYPE:  INP   LOCATION:  0469                                 FACILITY:  [facility]   PHYSICIAN:  Ollen Gross, M.D.                 DATE OF BIRTH:  1919-09-07   DATE OF ADMISSION:  01/26/2003  DATE OF DISCHARGE:  01/30/2003                                 DISCHARGE SUMMARY   ADMITTING DIAGNOSES:  1. Osteoarthritis right hip.  2. History of gastrointestinal bleed secondary to ulcers.   DISCHARGE DIAGNOSES:  1. Osteoarthritis right hip status post right total hip arthroplasty.  2. Mild hypokalemia, improved.  3. Mild hyponatremia, improved.  4. Past history of gastrointestinal bleed secondary to ulcers.   PROCEDURE:  Date of surgery January 26, 2003.  Right total hip  arthroplasty.  Surgeon Ollen Gross, M.D.  Assistant Alexzandrew L.  Julien Girt, P.A.  Spinal anesthesia.  Estimated blood loss 600 mL.  Hemovac  drain x1.   BRIEF HISTORY:  An 77 year old female with end-stage osteoarthritis of the  right hip.  She has been seen and treated by Ollen Gross, M.D.  She has  been treated with nonoperative management, however, has continued to have  pain.  She is known to have end-stage arthritis.  Felt she would benefit  from undergoing surgery.  Risks and benefits discussed.  The patient  subsequently admitted to the hospital.   CONSULTS:  Rehabilitation services, Ranelle Oyster, M.D.   LABORATORY DATA:  CBC on admission:  Hemoglobin 11.5, hematocrit 33.6, white  cell count 6.1, red cell count 3.71.  Differential all within normal limits.  Postoperative H&H 11.0 and 31.9.  Last noted H&H 9.4 and 26.8.  PT/PTT on  admission were 12.9 and 37, respectively with an INR of 1.0.  Serial pro  times followed.  Last noted PT/INR 24.5 and 3.1.  Chemistry panel on  admission:  Mildly elevated glucose of 127.  Remaining chemistry panel all  within normal limits.   Serial BMETs were followed.  Sodium and potassium  dropped to 134 and 3.4, respectively.  Came back up to 136 and 4.3,  respectively.  Calcium dropped from 9.0 to 7.6.  Urinalysis on admission  cloudy, otherwise negative.  Blood group type AB+.   EKG January 20, 2003:  Normal sinus rhythm, left axis deviation.  No old  tracing to compare.  Confirmed by Francisca December, M.D.  Right hip films  January 20, 2003:  Severe right hip osteoarthritis, severe degenerative  joint disease space narrowing left hip, degenerative disk disease and  vertebral changes L5-S1, vascular calcification.  Chest x-ray two views  January 20, 2003:  No active disease.  Postoperative hip/pelvis films:  Good AP position right total hip prosthesis.   HOSPITAL COURSE:  The patient was admitted to [facility], taken to  the OR, underwent above stated procedure without  complications.  The patient  tolerated procedure well.  Later transferred to recovery room, then to the  orthopedic floor for continued postoperative care.  Vital signs were  followed.  The patient was given 24 hours of postoperative IV antibiotics in  the form of Ancef.  Placed on Coumadin for protocol.  Placed partial  weightbearing 50% to the right lower extremity.  PT and OT were consulted to  assist with gait training, ambulation, and ADLs.  Hemovac drain placed at  time of surgery was pulled on postoperative day one.  The patient underwent  a rehabilitation consult by Ranelle Oyster, M.D.  Seen and evaluated and  felt as long as she progressed well she may be able to go home.  She was  initially placed on PC analgesics for pain control, weaned over to p.o.  medications by day two.  Dressing change initiated postoperative day two.  Incision was healing well.  Laboratories were followed very closely.  The  patient actually did progress very well with physical therapy up ambulating  approximately 6 feet by day two, 20 feet later that  afternoon, 80 feet by  the following day, and 90 feet by postoperative day four.  She continued to  do well and by day four she was tolerating p.o. medications, up ambulating.  Arrangements have been made for home care.  Was discharged home.   DISCHARGE PLAN:  The patient is discharged home on January 30, 2003.   DISCHARGE DIAGNOSES:  Please see above.   DISCHARGE MEDICATIONS:  1. Colace.  2. Trinsicon.  3. Coumadin.  4. Percocet.  5. Robaxin.   DIET:  As tolerated.   FOLLOWUP:  Follow up two weeks from surgery.  Call the office for an  appointment [phone].   ACTIVITY:  Partial weightbearing 50% right lower extremity.  Total hip  arthroplasty hip precautions.  Home health PT, home health nursing through  [facility].  May start showering.   DISPOSITION:  Home.   CONDITION ON DISCHARGE:  Improved.     Alexzandrew L. Julien Girt, P.A.              Ollen Gross, M.D.    ALP/MEDQ  D:  03/04/2003  T:  03/04/2003  Job:  [id number]   cc:   Georgina Quint. Plotnikov, M.D. [facility]

## 2010-09-23 NOTE — Discharge Summary (Signed)
NAME:  Theresa Hampton, Theresa Hampton                        ACCOUNT NO.:  [id number]   MEDICAL RECORD NO.:  [id number]                   PATIENT TYPE:  INP   LOCATION:  0351                                 FACILITY:  [facility]   PHYSICIAN:  Georgina Quint. Plotnikov, M.D. [facility]      DATE OF BIRTH:  [birth year]   DATE OF ADMISSION:  01/16/2002  DATE OF DISCHARGE:  01/18/2002                                 DISCHARGE SUMMARY   FINAL DIAGNOSES:  1. Upper gastrointestinal bleeding due to problem #2.  2. Gastric ulcers plus duodenal ulcers/Helicobacter pylori positive.  3. Anemia due to problem #1.  4. Syncope due to problem #1.  5. Slightly abnormal chest x-ray, followup PA and lateral recommended.   PROCEDURE:  1. Upper endoscopy, Dr. Russella Dar, January 16, 2002.  2. Blood transfusion.   CONSULTATIONS:  GI consultation, Dr. Russella Dar.   DISCHARGE MEDICATIONS:  1. Prevpac for 10 days as directed.  2. Prevacid 30 mg one a day in the morning to start after Prevpac.  3. Chromagen start in three weeks.  4. Fosamax restart in one month.  5. Darvocet-N 100 q.i.d. p.r.n. arthritis pain.   SPECIAL INSTRUCTIONS:  Stop Celebrex.   FOLLOW UP PLANS:  1. Followup with Dr. Russella Dar in two months.  2. Dr. Posey Rea in two weeks.   HISTORY:  The patient is a 77 year old female who presents with weakness,  syncope, and melena on January 16, 2002 to the office and was admitted  from there.  For the details, please address the admission History and  Physical.   HOSPITAL COURSE:  During the course of hospitalization, the patient was seen  by Dr. Russella Dar.  She underwent endoscopy and was found to have multiple ulcers  in the stomach and duodenum.  She was started on IV Protonix.  The biopsy  came back with H. pylori positive results.  She was found to have a low  hemoglobin and she was transfused with packed red blood cells.  On the day  of discharge, her temp was 97.1, respirations 20, heart rate 83, blood  pressure 132/63.   She was feeling much better. HEENT with moist mucosa, no  pallor.  Neck supple.  Lungs clear, no wheeze or rales.  Heart with S1 & S2,  normal gallop, no murmur.  Abdomen soft, nontender, no organomegaly, no mass  felt.  Lower extremities without edema.  She is alert, oriented, and  cooperative.   LABORATORY AND ACCESSORY DATA:  H. pylori biopsy positive.  Hemoglobin 11.1  (was 8.1).  BMET normal.   Endoscopy on January 16, 2002 showed acute gastric ulcers varying 4-6 mm  in size, three prepyloric ulcers, clean-based duodenal ulcers 3 mm with  clear base, no active hemorrhage.  EKG in the office revealed sinus  tachycardia.  CT head with __________ .  Chest x-ray:  Probable bony density  overlying left anterior rib with recommended followup x-ray.  Georgina Quint. Plotnikov, M.D. [facility]    AVP/MEDQ  D:  01/18/2002  T:  01/18/2002  Job:  [id number]   cc:   Venita Lick. Pleas Koch., M.D. [facility]

## 2010-09-23 NOTE — H&P (Signed)
NAME:  Theresa Hampton, Theresa Hampton                        ACCOUNT NO.:  [id number]   MEDICAL RECORD NO.:  [id number]                   PATIENT TYPE:  INP   LOCATION:  NA                                   FACILITY:  [facility]   PHYSICIAN:  Ollen Gross, M.D.                 DATE OF BIRTH:  11/11/1919   DATE OF ADMISSION:  01/26/2003  DATE OF DISCHARGE:                                HISTORY & PHYSICAL   CHIEF COMPLAINT:  Right hip pain.   HISTORY OF PRESENT ILLNESS:  The patient is an 77 year old female whose been  seen by Dr. Lequita Halt for right hip pain. She is a previous patient of Dr. Worthy Rancher and follows up with Dr. Lequita Halt for severe pain in the right hip and  leg. It has been ongoing for several months now and progressively gotten  worse. The pain is throughout the thigh and even radiates down to the knee.  She is seen in the office and found to have some degenerative disk disease  in the low back and some spondylosis; however, on her AP and right hip  films, they do show severe end-stage arthritis with a slight protrusio  deformity. She was recommended to Dr. Lequita Halt by several friends. She is  seen in the office and felt that at this point due to her level of pain and  the significant findings on her x-rays that she would be an appropriate  candidate for hip replacement. The risks and benefits of this procedure have  been discussed with the patient and her husband and she elected to proceed  with surgery. The patient states that she has an appointment with Dr. Sonda Primes for preop evaluation, that evaluation is pending at the time of  dictation.   ALLERGIES:  No known drug allergies.   CURRENT MEDICATIONS:  1. Feagin daily.  2. Nexium daily.  3. Celebrex daily stopped prior to surgery.  4. Zocor daily.  5. Fosamax weekly.  6. Climara hormone patch weekly.  7. Aspirin daily, stop prior to surgery.  8. Complications essential women's daily vitamins, stop prior to surgery.   PAST MEDICAL HISTORY:  1. Osteoarthritis.  2. 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  3. Past history of gastrointestinal ulcers with bleed.   PAST SURGICAL HISTORY:  Partial thyroidectomy in 1958 and also she has  undergone EGD scopes x3   SOCIAL HISTORY:  Married, retired, nonsmoker, no alcohol, has two children.  Her husband will be assisting with her care after surgery. Two story home.  She states her bedroom is on the first floor.   FAMILY HISTORY:  Both parents with arthritis. She has one sister with colon  cancer and one sister with vaginal cancer, one of her sisters age 22 with a  history of stroke.   REVIEW OF SYMPTOMS:  GENERAL:  No fever, chills or night sweats. NEUROLOGIC:  No seizure,  syncope, paralysis. RESPIRATORY:  No shortness of breath,  productive cough or hemoptysis. CARDIOVASCULAR:  No chest pain, angina,  orthopnea. GI:  Past history of gastrointestinal bleed, no recent nausea,  vomiting, diarrhea or constipation. No recent blood or mucus in the stools.  GU:  No dysuria, hematuria or discharge. MUSCULOSKELETAL:  Pertinent to that  of the right hip found in the history of present illness.   PHYSICAL EXAMINATION:  VITAL SIGNS:  Pulse 84, respirations 12, blood  pressure 122/78.  GENERAL:  The patient is a 77 year old, thin female, well-nourished, well-  developed, in no acute distress. She is alert, oriented and cooperative,  very pleasant at the time of exam. Appears to be a good historian. She is  accompanied by her husband.  HEENT:  Normocephalic, atraumatic. Pupils are round and reactive. EOM's are  intact. Oropharynx is clear and neck is supple. No carotid bruits are  appreciated. She does have a thyroid scar noted at the base of the neck.  CHEST:  Clear to auscultation anterior and posterior chest walls.  HEART:  Regular rate and rhythm, no murmur, S1, S2 noted.  ABDOMEN:  Soft, flat, nontender, bowel sounds are present.  RECTAL/BREASTS/GENITALIA:  Not  done not pertinent to present illness.  EXTREMITIES:  Significant to that of the right hip. She has hip flexion of  about 85 degrees, internal rotation and external rotation are only 5 and 10  respectively. Abduction of about 20 degrees. There is pain with rotation and  flexion of the hip. Motor function is intact.   IMPRESSION:  1. Osteoarthritis right hip.  2. Past history of gastrointestinal bleed secondary to ulcers.   PLAN:  The patient will be admitted to [facility] to undergo right  total hip replacement arthroplasty. Surgery will be performed by Ollen Gross, M.D. The patient's medical physician is Georgina Quint. Plotnikov, M.D.  Dr. Posey Rea will be notified of the room number and admission and will be  consulted if needed for any medical assistance with this patient throughout  the hospital course.     Alexzandrew L. Julien Girt, P.A.              Ollen Gross, M.D.    ALP/MEDQ  D:  01/21/2003  T:  01/21/2003  Job:  [id number]   cc:   Georgina Quint. Plotnikov, M.D. [facility]

## 2010-09-23 NOTE — Assessment & Plan Note (Signed)
[facility]                             PRIMARY CARE OFFICE NOTE   NAME:Cordon, NEKAYLA HEIDER                     MRN:          [id number]  DATE:11/17/2005                            DOB:          1919-09-01    PROCEDURE:  Skin biopsy.   INDICATION:  Changing moles with irregular color and irritation.   Risks including incomplete procedure, scar formation, bleeding, infection,  others, as well as benefits were explained to the patient in detail.  She  was placed in decubitus position.  The area at the base of the right neck  was prepped with Betadine and alcohol.  Mole #1, measuring 11 mm, was  injected 0.5 mL of 2% lidocaine with epinephrine.  Shave biopsy was  obtained.  The specimen was sent to the lab (not complete).  The rest of the  lesion was destroyed with a Hyfrecator.  Tolerated well, complications none.   Mole #2 measuring 11 mm at the base of the right neck was removed in an  identical fashion.   Mole #3 measuring 12 mm at the base of the right neck was removed in  identical fashion.   Several other moles were removed from both sides.  Biopsy specimens not  sent.                                   Sonda Primes, MD   AP/MedQ  DD:  11/20/2005  DT:  11/20/2005  Job #:  [id number]

## 2010-10-26 ENCOUNTER — Ambulatory Visit: Payer: PRIVATE HEALTH INSURANCE | Admitting: Internal Medicine

## 2010-10-27 ENCOUNTER — Other Ambulatory Visit (INDEPENDENT_AMBULATORY_CARE_PROVIDER_SITE_OTHER): Payer: Medicare Other | Admitting: Internal Medicine

## 2010-10-27 ENCOUNTER — Ambulatory Visit (INDEPENDENT_AMBULATORY_CARE_PROVIDER_SITE_OTHER): Payer: Medicare Other | Admitting: Internal Medicine

## 2010-10-27 ENCOUNTER — Other Ambulatory Visit (INDEPENDENT_AMBULATORY_CARE_PROVIDER_SITE_OTHER): Payer: Medicare Other

## 2010-10-27 ENCOUNTER — Encounter: Payer: Self-pay | Admitting: Internal Medicine

## 2010-10-27 ENCOUNTER — Telehealth: Payer: Self-pay | Admitting: Internal Medicine

## 2010-10-27 DIAGNOSIS — R0989 Other specified symptoms and signs involving the circulatory and respiratory systems: Secondary | ICD-10-CM

## 2010-10-27 DIAGNOSIS — M6281 Muscle weakness (generalized): Secondary | ICD-10-CM

## 2010-10-27 DIAGNOSIS — K250 Acute gastric ulcer with hemorrhage: Secondary | ICD-10-CM

## 2010-10-27 DIAGNOSIS — E559 Vitamin D deficiency, unspecified: Secondary | ICD-10-CM

## 2010-10-27 DIAGNOSIS — M25539 Pain in unspecified wrist: Secondary | ICD-10-CM

## 2010-10-27 DIAGNOSIS — R29898 Other symptoms and signs involving the musculoskeletal system: Secondary | ICD-10-CM

## 2010-10-27 DIAGNOSIS — M1990 Unspecified osteoarthritis, unspecified site: Secondary | ICD-10-CM

## 2010-10-27 DIAGNOSIS — E782 Mixed hyperlipidemia: Secondary | ICD-10-CM

## 2010-10-27 DIAGNOSIS — M25531 Pain in right wrist: Secondary | ICD-10-CM

## 2010-10-27 DIAGNOSIS — Z0000 Encounter for general adult medical examination without abnormal findings: Secondary | ICD-10-CM

## 2010-10-27 LAB — URINALYSIS, ROUTINE W REFLEX MICROSCOPIC
Bilirubin Urine: NEGATIVE
Hgb urine dipstick: NEGATIVE
Ketones, ur: NEGATIVE
Total Protein, Urine: NEGATIVE
Urine Glucose: NEGATIVE

## 2010-10-27 LAB — HEPATIC FUNCTION PANEL
ALT: 13 U/L (ref 0–35)
AST: 17 U/L (ref 0–37)
Alkaline Phosphatase: 52 U/L (ref 39–117)
Total Bilirubin: 0.5 mg/dL (ref 0.3–1.2)

## 2010-10-27 LAB — URIC ACID: Uric Acid, Serum: 4.6 mg/dL (ref 2.4–7.0)

## 2010-10-27 LAB — LIPID PANEL
LDL Cholesterol: 103 mg/dL — ABNORMAL HIGH (ref 0–99)
Total CHOL/HDL Ratio: 3
VLDL: 10.2 mg/dL (ref 0.0–40.0)

## 2010-10-27 LAB — BASIC METABOLIC PANEL
BUN: 13 mg/dL (ref 6–23)
CO2: 28 mEq/L (ref 19–32)
Chloride: 105 mEq/L (ref 96–112)
Creatinine, Ser: 0.6 mg/dL (ref 0.4–1.2)
Glucose, Bld: 84 mg/dL (ref 70–99)

## 2010-10-27 NOTE — Progress Notes (Signed)
  Subjective:    Patient ID: Theresa Hampton, female    DOB: 1919-10-18, 77 y.o.   MRN: [id number]  HPI  The patient presents for a follow-up of  chronic OA, chronic dyslipidemia, GERD controlled with medicines C/o R wrist pain x wks over R thumb base C/o weak legs - hard to get up from chair    Review of Systems  Constitutional: Positive for fatigue. Negative for chills, activity change, appetite change and unexpected weight change.  HENT: Negative for congestion, mouth sores and sinus pressure.   Eyes: Negative for visual disturbance.  Respiratory: Negative for cough and chest tightness.   Gastrointestinal: Negative for nausea and abdominal pain.  Genitourinary: Negative for frequency, difficulty urinating and vaginal pain.  Musculoskeletal: Positive for arthralgias. Negative for back pain and gait problem.  Skin: Negative for pallor and rash.  Neurological: Negative for dizziness, tremors, weakness, numbness and headaches.  Psychiatric/Behavioral: Negative for confusion and sleep disturbance.       Objective:   Physical Exam  Constitutional: She appears well-developed and well-nourished. No distress.  HENT:  Head: Normocephalic.  Right Ear: External ear normal.  Left Ear: External ear normal.  Nose: Nose normal.  Mouth/Throat: Oropharynx is clear and moist.  Eyes: Conjunctivae are normal. Pupils are equal, round, and reactive to light. Right eye exhibits no discharge. Left eye exhibits no discharge.  Neck: Normal range of motion. Neck supple. No JVD present. No tracheal deviation present. No thyromegaly present.  Cardiovascular: Normal rate, regular rhythm and normal heart sounds.   Pulmonary/Chest: No stridor. No respiratory distress. She has no wheezes.  Abdominal: Soft. Bowel sounds are normal. She exhibits no distension and no mass. There is no tenderness. There is no rebound and no guarding.  Musculoskeletal: She exhibits no edema and no tenderness.  Lymphadenopathy:      She has no cervical adenopathy.  Neurological: She displays normal reflexes. No cranial nerve deficit. She exhibits normal muscle tone. Coordination normal.  Skin: No rash noted. No erythema.  Psychiatric: She has a normal mood and affect. Her behavior is normal. Judgment and thought content normal.          Assessment & Plan:

## 2010-10-27 NOTE — Telephone Encounter (Signed)
Stacey , please, inform the patient: labs are OK   Please, keep  next office visit appointment.   Thank you !   

## 2010-10-27 NOTE — Patient Instructions (Signed)
Hold Simvastatin 

## 2010-10-28 NOTE — Telephone Encounter (Signed)
Pt informed

## 2010-10-31 NOTE — Assessment & Plan Note (Signed)
-   Hold statin 

## 2010-10-31 NOTE — Assessment & Plan Note (Signed)
On Rx 

## 2011-01-31 ENCOUNTER — Encounter: Payer: Self-pay | Admitting: Internal Medicine

## 2011-01-31 ENCOUNTER — Ambulatory Visit (INDEPENDENT_AMBULATORY_CARE_PROVIDER_SITE_OTHER): Payer: Medicare Other | Admitting: Internal Medicine

## 2011-01-31 VITALS — BP 160/90 | HR 80 | Temp 97.5°F | Resp 16 | Wt >= 6400 oz

## 2011-01-31 DIAGNOSIS — Z0000 Encounter for general adult medical examination without abnormal findings: Secondary | ICD-10-CM

## 2011-01-31 DIAGNOSIS — Z23 Encounter for immunization: Secondary | ICD-10-CM

## 2011-01-31 DIAGNOSIS — E538 Deficiency of other specified B group vitamins: Secondary | ICD-10-CM

## 2011-01-31 DIAGNOSIS — M1990 Unspecified osteoarthritis, unspecified site: Secondary | ICD-10-CM

## 2011-01-31 DIAGNOSIS — E785 Hyperlipidemia, unspecified: Secondary | ICD-10-CM

## 2011-01-31 DIAGNOSIS — E559 Vitamin D deficiency, unspecified: Secondary | ICD-10-CM

## 2011-01-31 MED ORDER — CYANOCOBALAMIN 1000 MCG/ML IJ SOLN
1000.0000 ug | Freq: Once | INTRAMUSCULAR | Status: AC
  Administered 2011-01-31: 1000 ug via INTRAMUSCULAR

## 2011-01-31 NOTE — Assessment & Plan Note (Signed)
Re-start prescription therapy as reflected on the Med list. Risks associated with treatment noncompliance were discussed. Compliance was encouraged.   

## 2011-01-31 NOTE — Assessment & Plan Note (Signed)
Continue with current prescription therapy as reflected on the Med list.  

## 2011-01-31 NOTE — Progress Notes (Signed)
Addended by: Merrilyn Puma on: 01/31/2011 05:05 PM   Modules accepted: Orders

## 2011-01-31 NOTE — Progress Notes (Signed)
  Subjective:    Patient ID: Theresa Hampton, female    DOB: 15-May-1919, 77 y.o.   MRN: [id number]  HPI  The patient presents for a follow-up of  chronic hypertension, chronic dyslipidemia, OA controlled with medicines    Review of Systems  Constitutional: Negative for chills, activity change, appetite change, fatigue and unexpected weight change.  HENT: Negative for congestion, mouth sores and sinus pressure.   Eyes: Negative for visual disturbance.  Respiratory: Negative for cough and chest tightness.   Gastrointestinal: Negative for nausea and abdominal pain.  Genitourinary: Negative for frequency, difficulty urinating and vaginal pain.  Musculoskeletal: Positive for back pain and arthralgias. Negative for gait problem.  Skin: Negative for pallor and rash.  Neurological: Negative for dizziness, tremors, weakness, numbness and headaches.  Psychiatric/Behavioral: Negative for confusion and sleep disturbance. The patient is nervous/anxious.        Objective:   Physical Exam  Constitutional: She appears well-developed and well-nourished. No distress.  HENT:  Head: Normocephalic.  Right Ear: External ear normal.  Left Ear: External ear normal.  Nose: Nose normal.  Mouth/Throat: Oropharynx is clear and moist.  Eyes: Conjunctivae are normal. Pupils are equal, round, and reactive to light. Right eye exhibits no discharge. Left eye exhibits no discharge.  Neck: Normal range of motion. Neck supple. No JVD present. No tracheal deviation present. No thyromegaly present.  Cardiovascular: Normal rate, regular rhythm and normal heart sounds.   Pulmonary/Chest: No stridor. No respiratory distress. She has no wheezes.  Abdominal: Soft. Bowel sounds are normal. She exhibits no distension and no mass. There is no tenderness. There is no rebound and no guarding.  Musculoskeletal: She exhibits tenderness (LS spine). She exhibits no edema.  Lymphadenopathy:    She has no cervical adenopathy.    Neurological: She displays normal reflexes. No cranial nerve deficit. She exhibits normal muscle tone. Coordination normal.  Skin: No rash noted. No erythema.  Psychiatric: She has a normal mood and affect. Her behavior is normal. Judgment and thought content normal.          Assessment & Plan:

## 2011-02-02 LAB — POCT HEMOGLOBIN-HEMACUE
Hemoglobin: 12.9
Operator id: 268271

## 2011-02-08 ENCOUNTER — Other Ambulatory Visit: Payer: Self-pay | Admitting: Internal Medicine

## 2011-03-08 ENCOUNTER — Telehealth: Payer: Self-pay | Admitting: *Deleted

## 2011-03-08 NOTE — Telephone Encounter (Signed)
Pt called with c/o nausea, she is wanting something called in for nausea to [facility]. Pt states she is unable to come in for an appointment . Please Advise

## 2011-03-08 NOTE — Telephone Encounter (Signed)
Pt called again req RX to help with nausea.

## 2011-03-09 MED ORDER — PROMETHAZINE HCL 12.5 MG PO TABS: 12.5000 mg | ORAL_TABLET | Freq: Four times a day (QID) | ORAL | Status: DC | PRN

## 2011-03-09 NOTE — Telephone Encounter (Signed)
Pt.notified

## 2011-03-09 NOTE — Telephone Encounter (Signed)
Ok Promethazine prn - done Thx

## 2011-05-05 ENCOUNTER — Other Ambulatory Visit: Payer: Self-pay | Admitting: Internal Medicine

## 2011-05-10 ENCOUNTER — Other Ambulatory Visit: Payer: Self-pay | Admitting: Internal Medicine

## 2011-05-10 ENCOUNTER — Other Ambulatory Visit (INDEPENDENT_AMBULATORY_CARE_PROVIDER_SITE_OTHER): Payer: Medicare Other

## 2011-05-10 ENCOUNTER — Ambulatory Visit (INDEPENDENT_AMBULATORY_CARE_PROVIDER_SITE_OTHER): Payer: Medicare Other | Admitting: Internal Medicine

## 2011-05-10 ENCOUNTER — Encounter: Payer: Self-pay | Admitting: Internal Medicine

## 2011-05-10 VITALS — BP 130/82 | HR 92 | Temp 97.7°F | Resp 16 | Wt 115.0 lb

## 2011-05-10 DIAGNOSIS — M1990 Unspecified osteoarthritis, unspecified site: Secondary | ICD-10-CM

## 2011-05-10 DIAGNOSIS — Z23 Encounter for immunization: Secondary | ICD-10-CM

## 2011-05-10 DIAGNOSIS — E785 Hyperlipidemia, unspecified: Secondary | ICD-10-CM

## 2011-05-10 DIAGNOSIS — E559 Vitamin D deficiency, unspecified: Secondary | ICD-10-CM

## 2011-05-10 DIAGNOSIS — R29898 Other symptoms and signs involving the musculoskeletal system: Secondary | ICD-10-CM

## 2011-05-10 DIAGNOSIS — K279 Peptic ulcer, site unspecified, unspecified as acute or chronic, without hemorrhage or perforation: Secondary | ICD-10-CM | POA: Diagnosis not present

## 2011-05-10 DIAGNOSIS — M6281 Muscle weakness (generalized): Secondary | ICD-10-CM

## 2011-05-10 DIAGNOSIS — E538 Deficiency of other specified B group vitamins: Secondary | ICD-10-CM

## 2011-05-10 DIAGNOSIS — Z0000 Encounter for general adult medical examination without abnormal findings: Secondary | ICD-10-CM | POA: Diagnosis not present

## 2011-05-10 DIAGNOSIS — M25531 Pain in right wrist: Secondary | ICD-10-CM

## 2011-05-10 LAB — URINALYSIS, ROUTINE W REFLEX MICROSCOPIC
Bilirubin Urine: NEGATIVE
Hgb urine dipstick: NEGATIVE
Urine Glucose: NEGATIVE
Urobilinogen, UA: 0.2 (ref 0.0–1.0)

## 2011-05-10 LAB — TSH: TSH: 2.96 u[IU]/mL (ref 0.35–5.50)

## 2011-05-10 LAB — CBC WITH DIFFERENTIAL/PLATELET
Basophils Absolute: 0.1 10*3/uL (ref 0.0–0.1)
Eosinophils Absolute: 0.2 10*3/uL (ref 0.0–0.7)
MCHC: 33.4 g/dL (ref 30.0–36.0)
MCV: 92.6 fl (ref 78.0–100.0)
Monocytes Absolute: 0.8 10*3/uL (ref 0.1–1.0)
Neutrophils Relative %: 66.3 % (ref 43.0–77.0)
Platelets: 251 10*3/uL (ref 150.0–400.0)
RDW: 12.8 % (ref 11.5–14.6)
WBC: 7.6 10*3/uL (ref 4.5–10.5)

## 2011-05-10 LAB — LIPID PANEL
Total CHOL/HDL Ratio: 3
Triglycerides: 75 mg/dL (ref 0.0–149.0)
VLDL: 15 mg/dL (ref 0.0–40.0)

## 2011-05-10 LAB — COMPREHENSIVE METABOLIC PANEL
AST: 19 U/L (ref 0–37)
Albumin: 3.8 g/dL (ref 3.5–5.2)
Alkaline Phosphatase: 53 U/L (ref 39–117)
Potassium: 4.1 mEq/L (ref 3.5–5.1)
Sodium: 142 mEq/L (ref 135–145)
Total Bilirubin: 0.5 mg/dL (ref 0.3–1.2)
Total Protein: 7.2 g/dL (ref 6.0–8.3)

## 2011-05-10 NOTE — Assessment & Plan Note (Signed)
Continue with current prescription therapy as reflected on the Med list.  

## 2011-05-10 NOTE — Patient Instructions (Signed)
Hold simvastatin x 1-3 weeks

## 2011-05-10 NOTE — Progress Notes (Signed)
  Subjective:    Patient ID: Theresa Hampton, female    DOB: November 16, 1919, 78 y.o.   MRN: [id number]  HPI  The patient presents for a follow-up of  chronic hypertension, chronic dyslipidemia, GERD controlled with medicines    Review of Systems  Constitutional: Negative for chills, activity change, appetite change, fatigue and unexpected weight change.  HENT: Negative for congestion, mouth sores and sinus pressure.   Eyes: Negative for visual disturbance.  Respiratory: Negative for cough and chest tightness.   Gastrointestinal: Negative for nausea and abdominal pain.  Genitourinary: Negative for frequency, difficulty urinating and vaginal pain.  Musculoskeletal: Negative for back pain and gait problem.  Skin: Negative for pallor and rash.  Neurological: Negative for dizziness, tremors, weakness, numbness and headaches.  Psychiatric/Behavioral: Negative for confusion and sleep disturbance.   Wt Readings from Last 3 Encounters:  05/10/11 115 lb (52.164 kg)  01/31/11 1119 lb (507.575 kg)  10/27/10 119 lb (53.978 kg)       Objective:   Physical Exam  Constitutional: She appears well-developed. No distress.  HENT:  Head: Normocephalic.  Right Ear: External ear normal.  Left Ear: External ear normal.  Nose: Nose normal.  Mouth/Throat: Oropharynx is clear and moist.  Eyes: Conjunctivae are normal. Pupils are equal, round, and reactive to light. Right eye exhibits no discharge. Left eye exhibits no discharge.  Neck: Normal range of motion. Neck supple. No JVD present. No tracheal deviation present. No thyromegaly present.  Cardiovascular: Normal rate, regular rhythm and normal heart sounds.   Pulmonary/Chest: No stridor. No respiratory distress. She has no wheezes.  Abdominal: Soft. Bowel sounds are normal. She exhibits no distension and no mass. There is no tenderness. There is no rebound and no guarding.  Musculoskeletal: She exhibits no edema and no tenderness.  Lymphadenopathy:      She has no cervical adenopathy.  Neurological: She displays normal reflexes. No cranial nerve deficit. She exhibits normal muscle tone. Coordination normal.  Skin: No rash noted. No erythema.  Psychiatric: She has a normal mood and affect. Her behavior is normal. Judgment and thought content normal.          Assessment & Plan:

## 2011-05-10 NOTE — Assessment & Plan Note (Signed)
Hold zocor

## 2011-07-24 DIAGNOSIS — H40019 Open angle with borderline findings, low risk, unspecified eye: Secondary | ICD-10-CM | POA: Diagnosis not present

## 2011-08-03 ENCOUNTER — Ambulatory Visit (INDEPENDENT_AMBULATORY_CARE_PROVIDER_SITE_OTHER): Payer: Medicare Other | Admitting: Family Medicine

## 2011-08-03 VITALS — BP 124/71 | HR 108 | Temp 98.2°F | Resp 16 | Ht 65.5 in | Wt 116.0 lb

## 2011-08-03 DIAGNOSIS — R197 Diarrhea, unspecified: Secondary | ICD-10-CM | POA: Diagnosis not present

## 2011-08-03 DIAGNOSIS — R110 Nausea: Secondary | ICD-10-CM | POA: Diagnosis not present

## 2011-08-03 MED ORDER — SODIUM CHLORIDE 0.9 % IV SOLN
4.0000 mg | Freq: Once | INTRAVENOUS | Status: AC
  Administered 2011-08-03: 4 mg via INTRAVENOUS

## 2011-08-03 MED ORDER — PROMETHAZINE HCL 12.5 MG PO TABS: 6.2500 mg | ORAL_TABLET | Freq: Three times a day (TID) | ORAL | Status: DC | PRN

## 2011-08-03 NOTE — Progress Notes (Signed)
  Subjective:    Patient ID: Theresa Hampton, female    DOB: January 13, 1920, 78 y.o.   MRN: [id number]  HPI 78 yo female with long problem list, though primarily OA and GERD/Peptic Ulcer disease, here with GI complaints.  Very nauseated and having diarrhea since Tuesday (3 days).  Worsened last night - "unbearable".  Sips fluids and come right back up.   Chronic sinusitis - flaring again right now.  Draining down throat and worsening nausea.   No history of heart problems.     Friday husband was vomitting and weak and called 911, taken to ED.  Given nausea shots (zofran) and sent home.  Better in about 3 days.     Review of Systems Negative except as per HPI     Objective:   Physical Exam  Constitutional: Vital signs are normal. She appears well-developed and well-nourished. She is active.  Cardiovascular: Normal rate, regular rhythm, normal heart sounds and normal pulses.   Pulmonary/Chest: Effort normal and breath sounds normal.  Abdominal: Soft. Normal appearance and bowel sounds are normal. She exhibits no distension and no mass. There is no hepatosplenomegaly. There is no tenderness. There is no rigidity, no rebound, no guarding, no CVA tenderness, no tenderness at McBurney's point and negative Murphy's sign. No hernia.  Neurological: She is alert.   Minimal improvement after 1liter NS and 4 IV zofran.  Lungs clear after infusion.  Repeat 4mg  IV zofran and normal saline. of additional bag given then stopped.  Phenergan 6.25 mg po given.  Started to feel improvement         Assessment & Plan:  Viral gastro - nontoxic and stable.  Given 1.5 liters.  Can treat with phenergan 12.5, 1/2 every 8 hrs as needed.  If not improved by SUn/Mon or worsens, return

## 2011-08-15 ENCOUNTER — Encounter: Payer: Self-pay | Admitting: Internal Medicine

## 2011-08-15 ENCOUNTER — Ambulatory Visit (INDEPENDENT_AMBULATORY_CARE_PROVIDER_SITE_OTHER): Payer: Medicare Other | Admitting: Internal Medicine

## 2011-08-15 VITALS — BP 120/80 | HR 96 | Temp 98.0°F | Resp 16 | Wt 114.0 lb

## 2011-08-15 DIAGNOSIS — E538 Deficiency of other specified B group vitamins: Secondary | ICD-10-CM

## 2011-08-15 DIAGNOSIS — R5381 Other malaise: Secondary | ICD-10-CM | POA: Diagnosis not present

## 2011-08-15 DIAGNOSIS — R51 Headache: Secondary | ICD-10-CM | POA: Diagnosis not present

## 2011-08-15 DIAGNOSIS — E559 Vitamin D deficiency, unspecified: Secondary | ICD-10-CM | POA: Diagnosis not present

## 2011-08-15 DIAGNOSIS — R5383 Other fatigue: Secondary | ICD-10-CM | POA: Insufficient documentation

## 2011-08-15 NOTE — Assessment & Plan Note (Signed)
4/13 post GI virus - better

## 2011-08-15 NOTE — Assessment & Plan Note (Signed)
Continue with current prescription therapy as reflected on the Med list.  

## 2011-08-15 NOTE — Progress Notes (Signed)
Patient ID: ROSLYN ELSE, female   DOB: [birth year], 78 y.o.   MRN: [id number]  Subjective:    Patient ID: SVETLANA BAGBY, female    DOB: 1920-04-09, 78 y.o.   MRN: [id number]  HPI  The patient presents for a follow-up of  chronic hypertension, chronic dyslipidemia, GERD controlled with medicines C/o fatigue - she had a stomach virus 2 wk ago; better   Review of Systems  Constitutional: Negative for chills, activity change, appetite change, fatigue and unexpected weight change.  HENT: Negative for congestion, mouth sores and sinus pressure.   Eyes: Negative for visual disturbance.  Respiratory: Negative for cough and chest tightness.   Gastrointestinal: Negative for nausea and abdominal pain.  Genitourinary: Negative for frequency, difficulty urinating and vaginal pain.  Musculoskeletal: Negative for back pain and gait problem.  Skin: Negative for pallor and rash.  Neurological: Negative for dizziness, tremors, weakness, numbness and headaches.  Psychiatric/Behavioral: Negative for confusion and sleep disturbance.   Wt Readings from Last 3 Encounters:  08/15/11 114 lb (51.71 kg)  08/03/11 116 lb (52.617 kg)  05/10/11 115 lb (52.164 kg)   BP Readings from Last 3 Encounters:  08/15/11 120/80  08/03/11 124/71  05/10/11 130/82       Objective:   Physical Exam  Constitutional: She appears well-developed. No distress.  HENT:  Head: Normocephalic.  Right Ear: External ear normal.  Left Ear: External ear normal.  Nose: Nose normal.  Mouth/Throat: Oropharynx is clear and moist.  Eyes: Conjunctivae are normal. Pupils are equal, round, and reactive to light. Right eye exhibits no discharge. Left eye exhibits no discharge.  Neck: Normal range of motion. Neck supple. No JVD present. No tracheal deviation present. No thyromegaly present.  Cardiovascular: Normal rate, regular rhythm and normal heart sounds.   Pulmonary/Chest: No stridor. No respiratory distress. She has no  wheezes.  Abdominal: Soft. Bowel sounds are normal. She exhibits no distension and no mass. There is no tenderness. There is no rebound and no guarding.  Musculoskeletal: She exhibits no edema and no tenderness.  Lymphadenopathy:    She has no cervical adenopathy.  Neurological: She displays normal reflexes. No cranial nerve deficit. She exhibits normal muscle tone. Coordination normal.  Skin: No rash noted. No erythema.  Psychiatric: She has a normal mood and affect. Her behavior is normal. Judgment and thought content normal.   Lab Results  Component Value Date   WBC 7.6 05/10/2011   HGB 13.1 05/10/2011   HCT 39.1 05/10/2011   PLT 251.0 05/10/2011   GLUCOSE 88 05/10/2011   CHOL 229* 05/10/2011   TRIG 75.0 05/10/2011   HDL 87.60 05/10/2011   LDLDIRECT 125.4 05/10/2011   LDLCALC 103* 10/27/2010   ALT 12 05/10/2011   AST 19 05/10/2011   NA 142 05/10/2011   K 4.1 05/10/2011   CL 106 05/10/2011   CREATININE 0.6 05/10/2011   BUN 15 05/10/2011   CO2 31 05/10/2011   TSH 2.96 05/10/2011   HGBA1C 5.6 09/11/2008          Assessment & Plan:

## 2011-08-16 DIAGNOSIS — M169 Osteoarthritis of hip, unspecified: Secondary | ICD-10-CM | POA: Diagnosis not present

## 2011-08-23 DIAGNOSIS — M169 Osteoarthritis of hip, unspecified: Secondary | ICD-10-CM | POA: Diagnosis not present

## 2011-10-10 DIAGNOSIS — M169 Osteoarthritis of hip, unspecified: Secondary | ICD-10-CM | POA: Diagnosis not present

## 2011-11-15 ENCOUNTER — Ambulatory Visit: Payer: Medicare Other | Admitting: Internal Medicine

## 2011-11-17 ENCOUNTER — Encounter: Payer: Self-pay | Admitting: Internal Medicine

## 2011-11-17 ENCOUNTER — Ambulatory Visit (INDEPENDENT_AMBULATORY_CARE_PROVIDER_SITE_OTHER): Payer: Medicare Other | Admitting: Internal Medicine

## 2011-11-17 VITALS — BP 150/80 | HR 76 | Temp 97.6°F | Resp 16 | Wt 114.0 lb

## 2011-11-17 DIAGNOSIS — R5383 Other fatigue: Secondary | ICD-10-CM

## 2011-11-17 DIAGNOSIS — E785 Hyperlipidemia, unspecified: Secondary | ICD-10-CM

## 2011-11-17 DIAGNOSIS — E559 Vitamin D deficiency, unspecified: Secondary | ICD-10-CM | POA: Diagnosis not present

## 2011-11-17 DIAGNOSIS — M25552 Pain in left hip: Secondary | ICD-10-CM | POA: Insufficient documentation

## 2011-11-17 DIAGNOSIS — M25559 Pain in unspecified hip: Secondary | ICD-10-CM

## 2011-11-17 DIAGNOSIS — E538 Deficiency of other specified B group vitamins: Secondary | ICD-10-CM

## 2011-11-17 DIAGNOSIS — R5381 Other malaise: Secondary | ICD-10-CM | POA: Diagnosis not present

## 2011-11-17 NOTE — Assessment & Plan Note (Signed)
Continue with current prescription therapy as reflected on the Med list.  

## 2011-11-17 NOTE — Progress Notes (Signed)
  Subjective:    Patient ID: Theresa Hampton, female    DOB: 11/01/1919, 78 y.o.   MRN: [id number]  HPI  The patient presents for a follow-up of  chronic hypertension, chronic dyslipidemia, GERD controlled with medicines C/o L groin/hip pain   Review of Systems  Constitutional: Negative for chills, activity change, appetite change, fatigue and unexpected weight change.  HENT: Negative for congestion, mouth sores and sinus pressure.   Eyes: Negative for visual disturbance.  Respiratory: Negative for cough and chest tightness.   Gastrointestinal: Negative for nausea and abdominal pain.  Genitourinary: Negative for frequency, difficulty urinating and vaginal pain.  Musculoskeletal: Negative for back pain and gait problem.  Skin: Negative for pallor and rash.  Neurological: Negative for dizziness, tremors, weakness, numbness and headaches.  Psychiatric/Behavioral: Negative for confusion and disturbed wake/sleep cycle.   Wt Readings from Last 3 Encounters:  11/17/11 114 lb (51.71 kg)  08/15/11 114 lb (51.71 kg)  08/03/11 116 lb (52.617 kg)   BP Readings from Last 3 Encounters:  11/17/11 150/80  08/15/11 120/80  08/03/11 124/71       Objective:   Physical Exam  Constitutional: She appears well-developed. No distress.  HENT:  Head: Normocephalic.  Right Ear: External ear normal.  Left Ear: External ear normal.  Nose: Nose normal.  Mouth/Throat: Oropharynx is clear and moist.  Eyes: Conjunctivae are normal. Pupils are equal, round, and reactive to light. Right eye exhibits no discharge. Left eye exhibits no discharge.  Neck: Normal range of motion. Neck supple. No JVD present. No tracheal deviation present. No thyromegaly present.  Cardiovascular: Normal rate, regular rhythm and normal heart sounds.   Pulmonary/Chest: No stridor. No respiratory distress. She has no wheezes.  Abdominal: Soft. Bowel sounds are normal. She exhibits no distension and no mass. There is no  tenderness. There is no rebound and no guarding.  Musculoskeletal: She exhibits no edema and no tenderness.  Lymphadenopathy:    She has no cervical adenopathy.  Neurological: She displays normal reflexes. No cranial nerve deficit. She exhibits normal muscle tone. Coordination normal.  Skin: No rash noted. No erythema.  Psychiatric: She has a normal mood and affect. Her behavior is normal. Judgment and thought content normal.   Lab Results  Component Value Date   WBC 7.6 05/10/2011   HGB 13.1 05/10/2011   HCT 39.1 05/10/2011   PLT 251.0 05/10/2011   GLUCOSE 88 05/10/2011   CHOL 229* 05/10/2011   TRIG 75.0 05/10/2011   HDL 87.60 05/10/2011   LDLDIRECT 125.4 05/10/2011   LDLCALC 103* 10/27/2010   ALT 12 05/10/2011   AST 19 05/10/2011   NA 142 05/10/2011   K 4.1 05/10/2011   CL 106 05/10/2011   CREATININE 0.6 05/10/2011   BUN 15 05/10/2011   CO2 31 05/10/2011   TSH 2.96 05/10/2011   HGBA1C 5.6 09/11/2008          Assessment & Plan:

## 2011-11-17 NOTE — Assessment & Plan Note (Signed)
Resolved

## 2011-11-17 NOTE — Assessment & Plan Note (Signed)
May need a THR Getting shots

## 2011-11-21 DIAGNOSIS — M169 Osteoarthritis of hip, unspecified: Secondary | ICD-10-CM | POA: Diagnosis not present

## 2012-01-03 ENCOUNTER — Other Ambulatory Visit: Payer: Self-pay | Admitting: Internal Medicine

## 2012-01-03 NOTE — Telephone Encounter (Signed)
Please advise-last written 05/05/2011 #60 with 3 refills.

## 2012-01-23 ENCOUNTER — Other Ambulatory Visit: Payer: Self-pay | Admitting: Internal Medicine

## 2012-02-14 DIAGNOSIS — E2839 Other primary ovarian failure: Secondary | ICD-10-CM | POA: Diagnosis not present

## 2012-02-15 DIAGNOSIS — H40019 Open angle with borderline findings, low risk, unspecified eye: Secondary | ICD-10-CM | POA: Diagnosis not present

## 2012-02-16 ENCOUNTER — Encounter: Payer: Self-pay | Admitting: Internal Medicine

## 2012-02-16 ENCOUNTER — Ambulatory Visit (INDEPENDENT_AMBULATORY_CARE_PROVIDER_SITE_OTHER): Payer: Medicare Other | Admitting: Internal Medicine

## 2012-02-16 VITALS — BP 118/78 | HR 80 | Temp 97.5°F | Resp 16 | Wt 114.0 lb

## 2012-02-16 DIAGNOSIS — R252 Cramp and spasm: Secondary | ICD-10-CM

## 2012-02-16 DIAGNOSIS — D649 Anemia, unspecified: Secondary | ICD-10-CM

## 2012-02-16 DIAGNOSIS — E538 Deficiency of other specified B group vitamins: Secondary | ICD-10-CM

## 2012-02-16 DIAGNOSIS — Z23 Encounter for immunization: Secondary | ICD-10-CM

## 2012-02-16 DIAGNOSIS — E785 Hyperlipidemia, unspecified: Secondary | ICD-10-CM

## 2012-02-16 DIAGNOSIS — M1990 Unspecified osteoarthritis, unspecified site: Secondary | ICD-10-CM | POA: Diagnosis not present

## 2012-02-16 DIAGNOSIS — Z0000 Encounter for general adult medical examination without abnormal findings: Secondary | ICD-10-CM

## 2012-02-16 DIAGNOSIS — R202 Paresthesia of skin: Secondary | ICD-10-CM

## 2012-02-16 DIAGNOSIS — R209 Unspecified disturbances of skin sensation: Secondary | ICD-10-CM

## 2012-02-16 DIAGNOSIS — E559 Vitamin D deficiency, unspecified: Secondary | ICD-10-CM

## 2012-02-16 DIAGNOSIS — D509 Iron deficiency anemia, unspecified: Secondary | ICD-10-CM

## 2012-02-16 NOTE — Assessment & Plan Note (Signed)
Continue with current prescription therapy as reflected on the Med list.  

## 2012-02-16 NOTE — Assessment & Plan Note (Signed)
Monitor CBC 

## 2012-02-16 NOTE — Progress Notes (Signed)
   Subjective:    Patient ID: Theresa Hampton, female    DOB: 08/12/1919, 78 y.o.   MRN: [id number]  HPI  The patient presents for a follow-up of  chronic hypertension, chronic dyslipidemia, GERD controlled with medicines C/o L groin/hip pain - she is getting injections now   Review of Systems  Constitutional: Negative for chills, activity change, appetite change, fatigue and unexpected weight change.  HENT: Negative for congestion, mouth sores and sinus pressure.   Eyes: Negative for visual disturbance.  Respiratory: Negative for cough and chest tightness.   Gastrointestinal: Negative for nausea and abdominal pain.  Genitourinary: Negative for frequency, difficulty urinating and vaginal pain.  Musculoskeletal: Negative for back pain and gait problem.  Skin: Negative for pallor and rash.  Neurological: Negative for dizziness, tremors, weakness, numbness and headaches.  Psychiatric/Behavioral: Negative for confusion and disturbed wake/sleep cycle.   Wt Readings from Last 3 Encounters:  02/16/12 114 lb (51.71 kg)  11/17/11 114 lb (51.71 kg)  08/15/11 114 lb (51.71 kg)   BP Readings from Last 3 Encounters:  02/16/12 118/78  11/17/11 150/80  08/15/11 120/80       Objective:   Physical Exam  Constitutional: She appears well-developed. No distress.  HENT:  Head: Normocephalic.  Right Ear: External ear normal.  Left Ear: External ear normal.  Nose: Nose normal.  Mouth/Throat: Oropharynx is clear and moist.  Eyes: Conjunctivae normal are normal. Pupils are equal, round, and reactive to light. Right eye exhibits no discharge. Left eye exhibits no discharge.  Neck: Normal range of motion. Neck supple. No JVD present. No tracheal deviation present. No thyromegaly present.  Cardiovascular: Normal rate, regular rhythm and normal heart sounds.   Pulmonary/Chest: No stridor. No respiratory distress. She has no wheezes.  Abdominal: Soft. Bowel sounds are normal. She exhibits no  distension and no mass. There is no tenderness. There is no rebound and no guarding.  Musculoskeletal: She exhibits no edema and no tenderness.  Lymphadenopathy:    She has no cervical adenopathy.  Neurological: She displays normal reflexes. No cranial nerve deficit. She exhibits normal muscle tone. Coordination normal.  Skin: No rash noted. No erythema.  Psychiatric: She has a normal mood and affect. Her behavior is normal. Judgment and thought content normal.   Lab Results  Component Value Date   WBC 7.6 05/10/2011   HGB 13.1 05/10/2011   HCT 39.1 05/10/2011   PLT 251.0 05/10/2011   GLUCOSE 88 05/10/2011   CHOL 229* 05/10/2011   TRIG 75.0 05/10/2011   HDL 87.60 05/10/2011   LDLDIRECT 125.4 05/10/2011   LDLCALC 103* 10/27/2010   ALT 12 05/10/2011   AST 19 05/10/2011   NA 142 05/10/2011   K 4.1 05/10/2011   CL 106 05/10/2011   CREATININE 0.6 05/10/2011   BUN 15 05/10/2011   CO2 31 05/10/2011   TSH 2.96 05/10/2011   HGBA1C 5.6 09/11/2008          Assessment & Plan:

## 2012-02-23 ENCOUNTER — Other Ambulatory Visit: Payer: Self-pay | Admitting: Internal Medicine

## 2012-03-21 DIAGNOSIS — H409 Unspecified glaucoma: Secondary | ICD-10-CM | POA: Diagnosis not present

## 2012-03-21 DIAGNOSIS — H4011X0 Primary open-angle glaucoma, stage unspecified: Secondary | ICD-10-CM | POA: Diagnosis not present

## 2012-03-27 ENCOUNTER — Other Ambulatory Visit: Payer: Self-pay | Admitting: Internal Medicine

## 2012-05-08 DIAGNOSIS — N10 Acute tubulo-interstitial nephritis: Secondary | ICD-10-CM

## 2012-05-08 HISTORY — DX: Acute pyelonephritis: N10

## 2012-05-13 ENCOUNTER — Other Ambulatory Visit: Payer: Self-pay | Admitting: Internal Medicine

## 2012-05-20 ENCOUNTER — Other Ambulatory Visit (INDEPENDENT_AMBULATORY_CARE_PROVIDER_SITE_OTHER): Payer: Medicare Other

## 2012-05-20 DIAGNOSIS — E538 Deficiency of other specified B group vitamins: Secondary | ICD-10-CM | POA: Diagnosis not present

## 2012-05-20 DIAGNOSIS — K279 Peptic ulcer, site unspecified, unspecified as acute or chronic, without hemorrhage or perforation: Secondary | ICD-10-CM | POA: Diagnosis not present

## 2012-05-20 DIAGNOSIS — M1990 Unspecified osteoarthritis, unspecified site: Secondary | ICD-10-CM | POA: Diagnosis not present

## 2012-05-20 DIAGNOSIS — Z23 Encounter for immunization: Secondary | ICD-10-CM

## 2012-05-20 DIAGNOSIS — E559 Vitamin D deficiency, unspecified: Secondary | ICD-10-CM

## 2012-05-20 DIAGNOSIS — R202 Paresthesia of skin: Secondary | ICD-10-CM

## 2012-05-20 DIAGNOSIS — R252 Cramp and spasm: Secondary | ICD-10-CM

## 2012-05-20 DIAGNOSIS — D649 Anemia, unspecified: Secondary | ICD-10-CM

## 2012-05-20 DIAGNOSIS — E785 Hyperlipidemia, unspecified: Secondary | ICD-10-CM

## 2012-05-20 DIAGNOSIS — Z0000 Encounter for general adult medical examination without abnormal findings: Secondary | ICD-10-CM

## 2012-05-20 DIAGNOSIS — R209 Unspecified disturbances of skin sensation: Secondary | ICD-10-CM

## 2012-05-20 LAB — CBC WITH DIFFERENTIAL/PLATELET
Basophils Absolute: 0.1 10*3/uL (ref 0.0–0.1)
Basophils Relative: 0.9 % (ref 0.0–3.0)
HCT: 39.9 % (ref 36.0–46.0)
Hemoglobin: 13.2 g/dL (ref 12.0–15.0)
MCHC: 33 g/dL (ref 30.0–36.0)
MCV: 87.5 fl (ref 78.0–100.0)
RBC: 4.56 Mil/uL (ref 3.87–5.11)

## 2012-05-20 LAB — LIPID PANEL
Cholesterol: 169 mg/dL (ref 0–200)
LDL Cholesterol: 87 mg/dL (ref 0–99)

## 2012-05-20 LAB — URINALYSIS
Ketones, ur: NEGATIVE
Specific Gravity, Urine: 1.01 (ref 1.000–1.030)
Total Protein, Urine: NEGATIVE
Urine Glucose: NEGATIVE

## 2012-05-20 LAB — HEPATIC FUNCTION PANEL
ALT: 16 U/L (ref 0–35)
Alkaline Phosphatase: 55 U/L (ref 39–117)
Bilirubin, Direct: 0.1 mg/dL (ref 0.0–0.3)
Total Bilirubin: 0.6 mg/dL (ref 0.3–1.2)
Total Protein: 7 g/dL (ref 6.0–8.3)

## 2012-05-20 LAB — BASIC METABOLIC PANEL
CO2: 31 mEq/L (ref 19–32)
Chloride: 105 mEq/L (ref 96–112)
Sodium: 140 mEq/L (ref 135–145)

## 2012-05-20 LAB — IBC PANEL
Iron: 98 ug/dL (ref 42–145)
Saturation Ratios: 25.1 % (ref 20.0–50.0)

## 2012-05-20 LAB — TSH: TSH: 1.8 u[IU]/mL (ref 0.35–5.50)

## 2012-05-24 ENCOUNTER — Encounter: Payer: Self-pay | Admitting: Internal Medicine

## 2012-05-24 ENCOUNTER — Ambulatory Visit (INDEPENDENT_AMBULATORY_CARE_PROVIDER_SITE_OTHER): Payer: Medicare Other | Admitting: Internal Medicine

## 2012-05-24 VITALS — BP 138/80 | HR 80 | Temp 98.0°F | Resp 16 | Wt 115.0 lb

## 2012-05-24 DIAGNOSIS — M25552 Pain in left hip: Secondary | ICD-10-CM

## 2012-05-24 DIAGNOSIS — M25559 Pain in unspecified hip: Secondary | ICD-10-CM

## 2012-05-24 DIAGNOSIS — E559 Vitamin D deficiency, unspecified: Secondary | ICD-10-CM

## 2012-05-24 DIAGNOSIS — E785 Hyperlipidemia, unspecified: Secondary | ICD-10-CM | POA: Diagnosis not present

## 2012-05-24 DIAGNOSIS — E538 Deficiency of other specified B group vitamins: Secondary | ICD-10-CM | POA: Diagnosis not present

## 2012-05-24 DIAGNOSIS — D509 Iron deficiency anemia, unspecified: Secondary | ICD-10-CM

## 2012-05-24 NOTE — Assessment & Plan Note (Signed)
Cont w/injections 

## 2012-05-24 NOTE — Assessment & Plan Note (Signed)
Continue with current prescription therapy as reflected on the Med list.  

## 2012-05-24 NOTE — Assessment & Plan Note (Signed)
Resolved

## 2012-05-24 NOTE — Progress Notes (Signed)
   Subjective:    HPI  The patient presents for a follow-up of  chronic hypertension, chronic dyslipidemia, GERD controlled with medicines C/o L groin/hip pain - she is getting injections now - they help   Review of Systems  Constitutional: Negative for chills, activity change, appetite change, fatigue and unexpected weight change.  HENT: Negative for congestion, mouth sores and sinus pressure.   Eyes: Negative for visual disturbance.  Respiratory: Negative for cough and chest tightness.   Gastrointestinal: Negative for nausea and abdominal pain.  Genitourinary: Negative for frequency, difficulty urinating and vaginal pain.  Musculoskeletal: Negative for back pain and gait problem.  Skin: Negative for pallor and rash.  Neurological: Negative for dizziness, tremors, weakness, numbness and headaches.  Psychiatric/Behavioral: Negative for confusion and sleep disturbance.   Wt Readings from Last 3 Encounters:  05/24/12 115 lb (52.164 kg)  02/16/12 114 lb (51.71 kg)  11/17/11 114 lb (51.71 kg)   BP Readings from Last 3 Encounters:  05/24/12 138/80  02/16/12 118/78  11/17/11 150/80       Objective:   Physical Exam  Constitutional: She appears well-developed. No distress.  HENT:  Head: Normocephalic.  Right Ear: External ear normal.  Left Ear: External ear normal.  Nose: Nose normal.  Mouth/Throat: Oropharynx is clear and moist.  Eyes: Conjunctivae normal are normal. Pupils are equal, round, and reactive to light. Right eye exhibits no discharge. Left eye exhibits no discharge.  Neck: Normal range of motion. Neck supple. No JVD present. No tracheal deviation present. No thyromegaly present.  Cardiovascular: Normal rate, regular rhythm and normal heart sounds.   Pulmonary/Chest: No stridor. No respiratory distress. She has no wheezes.  Abdominal: Soft. Bowel sounds are normal. She exhibits no distension and no mass. There is no tenderness. There is no rebound and no guarding.    Musculoskeletal: She exhibits no edema and no tenderness.  Lymphadenopathy:    She has no cervical adenopathy.  Neurological: She displays normal reflexes. No cranial nerve deficit. She exhibits normal muscle tone. Coordination normal.  Skin: No rash noted. No erythema.  Psychiatric: She has a normal mood and affect. Her behavior is normal. Judgment and thought content normal.  L hip is painful w/ROM Lab Results  Component Value Date   WBC 6.9 05/20/2012   HGB 13.2 05/20/2012   HCT 39.9 05/20/2012   PLT 233.0 05/20/2012   GLUCOSE 97 05/20/2012   CHOL 169 05/20/2012   TRIG 75.0 05/20/2012   HDL 66.60 05/20/2012   LDLDIRECT 125.4 05/10/2011   LDLCALC 87 05/20/2012   ALT 16 05/20/2012   AST 23 05/20/2012   NA 140 05/20/2012   K 4.1 05/20/2012   CL 105 05/20/2012   CREATININE 0.7 05/20/2012   BUN 15 05/20/2012   CO2 31 05/20/2012   TSH 1.80 05/20/2012   HGBA1C 5.6 09/11/2008          Assessment & Plan:

## 2012-08-21 ENCOUNTER — Encounter: Payer: Self-pay | Admitting: Internal Medicine

## 2012-08-21 ENCOUNTER — Ambulatory Visit (INDEPENDENT_AMBULATORY_CARE_PROVIDER_SITE_OTHER): Payer: Medicare Other | Admitting: Internal Medicine

## 2012-08-21 VITALS — BP 142/74 | HR 72 | Temp 97.3°F | Resp 16 | Wt 117.0 lb

## 2012-08-21 DIAGNOSIS — J309 Allergic rhinitis, unspecified: Secondary | ICD-10-CM | POA: Diagnosis not present

## 2012-08-21 DIAGNOSIS — E538 Deficiency of other specified B group vitamins: Secondary | ICD-10-CM

## 2012-08-21 DIAGNOSIS — E559 Vitamin D deficiency, unspecified: Secondary | ICD-10-CM | POA: Diagnosis not present

## 2012-08-21 DIAGNOSIS — J069 Acute upper respiratory infection, unspecified: Secondary | ICD-10-CM

## 2012-08-21 DIAGNOSIS — M25552 Pain in left hip: Secondary | ICD-10-CM

## 2012-08-21 DIAGNOSIS — M25559 Pain in unspecified hip: Secondary | ICD-10-CM

## 2012-08-21 MED ORDER — HYDROCODONE-ACETAMINOPHEN 5-325 MG PO TABS: 1.0000 | ORAL_TABLET | Freq: Two times a day (BID) | ORAL | Status: DC | PRN

## 2012-08-21 MED ORDER — TRAMADOL HCL 50 MG PO TABS: 50.0000 mg | ORAL_TABLET | Freq: Two times a day (BID) | ORAL | Status: DC | PRN

## 2012-08-21 MED ORDER — FLUTICASONE PROPIONATE 50 MCG/ACT NA SUSP: 1.0000 | Freq: Every day | NASAL | Status: DC

## 2012-08-21 MED ORDER — AMOXICILLIN 500 MG PO CAPS: 500.0000 mg | ORAL_CAPSULE | Freq: Three times a day (TID) | ORAL | Status: DC

## 2012-08-21 NOTE — Assessment & Plan Note (Signed)
Continue with current prescription therapy as reflected on the Med list.  

## 2012-08-21 NOTE — Progress Notes (Signed)
   Subjective:    HPI  The patient presents for a follow-up of  chronic hypertension, chronic dyslipidemia, GERD controlled with medicines C/o L groin/hip pain - she is getting injections now - they help C/o URI x's x 2 wks  Review of Systems  Constitutional: Negative for chills, activity change, appetite change, fatigue and unexpected weight change.  HENT: Negative for congestion, mouth sores and sinus pressure.   Eyes: Negative for visual disturbance.  Respiratory: Negative for cough and chest tightness.   Gastrointestinal: Negative for nausea and abdominal pain.  Genitourinary: Negative for frequency, difficulty urinating and vaginal pain.  Musculoskeletal: Negative for back pain and gait problem.  Skin: Negative for pallor and rash.  Neurological: Negative for dizziness, tremors, weakness, numbness and headaches.  Psychiatric/Behavioral: Negative for confusion and sleep disturbance.   Wt Readings from Last 3 Encounters:  08/21/12 117 lb (53.071 kg)  05/24/12 115 lb (52.164 kg)  02/16/12 114 lb (51.71 kg)   BP Readings from Last 3 Encounters:  08/21/12 142/74  05/24/12 138/80  02/16/12 118/78       Objective:   Physical Exam  Constitutional: She appears well-developed. No distress.  HENT:  Head: Normocephalic.  Right Ear: External ear normal.  Left Ear: External ear normal.  Nose: Nose normal.  Mouth/Throat: Oropharynx is clear and moist.  Eyes: Conjunctivae are normal. Pupils are equal, round, and reactive to light. Right eye exhibits no discharge. Left eye exhibits no discharge.  Neck: Normal range of motion. Neck supple. No JVD present. No tracheal deviation present. No thyromegaly present.  Cardiovascular: Normal rate, regular rhythm and normal heart sounds.   Pulmonary/Chest: No stridor. No respiratory distress. She has no wheezes.  Abdominal: Soft. Bowel sounds are normal. She exhibits no distension and no mass. There is no tenderness. There is no rebound and  no guarding.  Musculoskeletal: She exhibits no edema and no tenderness.  Lymphadenopathy:    She has no cervical adenopathy.  Neurological: She displays normal reflexes. No cranial nerve deficit. She exhibits normal muscle tone. Coordination normal.  Skin: No rash noted. No erythema.  Psychiatric: She has a normal mood and affect. Her behavior is normal. Judgment and thought content normal.  L hip is painful w/ROM  Swollen cerv LNs, submand nodes   Lab Results  Component Value Date   WBC 6.9 05/20/2012   HGB 13.2 05/20/2012   HCT 39.9 05/20/2012   PLT 233.0 05/20/2012   GLUCOSE 97 05/20/2012   CHOL 169 05/20/2012   TRIG 75.0 05/20/2012   HDL 66.60 05/20/2012   LDLDIRECT 125.4 05/10/2011   LDLCALC 87 05/20/2012   ALT 16 05/20/2012   AST 23 05/20/2012   NA 140 05/20/2012   K 4.1 05/20/2012   CL 105 05/20/2012   CREATININE 0.7 05/20/2012   BUN 15 05/20/2012   CO2 31 05/20/2012   TSH 1.80 05/20/2012   HGBA1C 5.6 09/11/2008          Assessment & Plan:

## 2012-08-21 NOTE — Assessment & Plan Note (Signed)
Amoxicillin x 10 d Come back in a month if the glands are still swollen

## 2012-08-21 NOTE — Assessment & Plan Note (Signed)
Continue with current prescription therapy as reflected on the Med list. Inj - Dr Ethelene Hal

## 2012-08-21 NOTE — Patient Instructions (Signed)
Come back in a month if the glands are still swollen

## 2012-08-22 DIAGNOSIS — H4011X0 Primary open-angle glaucoma, stage unspecified: Secondary | ICD-10-CM | POA: Diagnosis not present

## 2012-08-22 DIAGNOSIS — H409 Unspecified glaucoma: Secondary | ICD-10-CM | POA: Diagnosis not present

## 2012-09-04 DIAGNOSIS — M169 Osteoarthritis of hip, unspecified: Secondary | ICD-10-CM | POA: Diagnosis not present

## 2012-09-23 ENCOUNTER — Telehealth: Payer: Self-pay | Admitting: Internal Medicine

## 2012-09-23 MED ORDER — CELECOXIB 100 MG PO CAPS: 100.0000 mg | ORAL_CAPSULE | Freq: Every day | ORAL | Status: DC

## 2012-09-23 NOTE — Telephone Encounter (Signed)
Done

## 2012-09-23 NOTE — Telephone Encounter (Signed)
Patient is requesting to have a script sent to [facility] pharmacy for celebrex

## 2012-10-30 ENCOUNTER — Other Ambulatory Visit: Payer: Self-pay | Admitting: Internal Medicine

## 2012-10-30 NOTE — Telephone Encounter (Signed)
Will you refill in Dr Plotnikov's absence? 

## 2012-11-20 ENCOUNTER — Encounter: Payer: Self-pay | Admitting: Internal Medicine

## 2012-11-20 ENCOUNTER — Other Ambulatory Visit: Payer: Self-pay | Admitting: Internal Medicine

## 2012-11-20 ENCOUNTER — Other Ambulatory Visit (INDEPENDENT_AMBULATORY_CARE_PROVIDER_SITE_OTHER): Payer: Medicare Other

## 2012-11-20 ENCOUNTER — Ambulatory Visit (INDEPENDENT_AMBULATORY_CARE_PROVIDER_SITE_OTHER): Payer: Medicare Other | Admitting: Internal Medicine

## 2012-11-20 VITALS — BP 140/82 | HR 80 | Temp 97.9°F | Resp 16 | Wt 114.0 lb

## 2012-11-20 DIAGNOSIS — E538 Deficiency of other specified B group vitamins: Secondary | ICD-10-CM

## 2012-11-20 DIAGNOSIS — IMO0002 Reserved for concepts with insufficient information to code with codable children: Secondary | ICD-10-CM

## 2012-11-20 DIAGNOSIS — E559 Vitamin D deficiency, unspecified: Secondary | ICD-10-CM | POA: Diagnosis not present

## 2012-11-20 DIAGNOSIS — M25559 Pain in unspecified hip: Secondary | ICD-10-CM | POA: Diagnosis not present

## 2012-11-20 DIAGNOSIS — M792 Neuralgia and neuritis, unspecified: Secondary | ICD-10-CM | POA: Insufficient documentation

## 2012-11-20 DIAGNOSIS — M25552 Pain in left hip: Secondary | ICD-10-CM

## 2012-11-20 LAB — BASIC METABOLIC PANEL
BUN: 10 mg/dL (ref 6–23)
Creatinine, Ser: 0.7 mg/dL (ref 0.4–1.2)
GFR: 89.31 mL/min (ref >60.00)

## 2012-11-20 LAB — HEPATIC FUNCTION PANEL: Total Bilirubin: 0.6 mg/dL (ref 0.3–1.2)

## 2012-11-20 MED ORDER — GABAPENTIN 100 MG PO CAPS: 100.0000 mg | ORAL_CAPSULE | Freq: Every evening | ORAL | Status: DC | PRN

## 2012-11-20 NOTE — Assessment & Plan Note (Signed)
Will try Gabapentin at hs 

## 2012-11-20 NOTE — Assessment & Plan Note (Signed)
Continue with current prescription therapy as reflected on the Med list.  

## 2012-11-20 NOTE — Progress Notes (Signed)
   Subjective:    HPI  The patient presents for a follow-up of  chronic hypertension, chronic dyslipidemia, GERD controlled with medicines C/o L groin/hip pain - she is getting injections now - they help C/o URI x's x 2 wks  Review of Systems  Constitutional: Negative for chills, activity change, appetite change, fatigue and unexpected weight change.  HENT: Negative for congestion, mouth sores and sinus pressure.   Eyes: Negative for visual disturbance.  Respiratory: Negative for cough and chest tightness.   Gastrointestinal: Negative for nausea and abdominal pain.  Genitourinary: Negative for frequency, difficulty urinating and vaginal pain.  Musculoskeletal: Negative for back pain and gait problem.  Skin: Negative for pallor and rash.  Neurological: Negative for dizziness, tremors, weakness, numbness and headaches.  Psychiatric/Behavioral: Negative for confusion and sleep disturbance.   Wt Readings from Last 3 Encounters:  11/20/12 114 lb (51.71 kg)  08/21/12 117 lb (53.071 kg)  05/24/12 115 lb (52.164 kg)   BP Readings from Last 3 Encounters:  11/20/12 150/82  08/21/12 142/74  05/24/12 138/80       Objective:   Physical Exam  Constitutional: She appears well-developed. No distress.  HENT:  Head: Normocephalic.  Right Ear: External ear normal.  Left Ear: External ear normal.  Nose: Nose normal.  Mouth/Throat: Oropharynx is clear and moist.  Eyes: Conjunctivae are normal. Pupils are equal, round, and reactive to light. Right eye exhibits no discharge. Left eye exhibits no discharge.  Neck: Normal range of motion. Neck supple. No JVD present. No tracheal deviation present. No thyromegaly present.  Cardiovascular: Normal rate, regular rhythm and normal heart sounds.   Pulmonary/Chest: No stridor. No respiratory distress. She has no wheezes.  Abdominal: Soft. Bowel sounds are normal. She exhibits no distension and no mass. There is no tenderness. There is no rebound and  no guarding.  Musculoskeletal: She exhibits no edema and no tenderness.  Lymphadenopathy:    She has no cervical adenopathy.  Neurological: She displays normal reflexes. No cranial nerve deficit. She exhibits normal muscle tone. Coordination normal.  Skin: No rash noted. No erythema.  Psychiatric: She has a normal mood and affect. Her behavior is normal. Judgment and thought content normal.  L hip is painful w/ROM  Swollen cerv LNs, submand nodes   Lab Results  Component Value Date   WBC 6.9 05/20/2012   HGB 13.2 05/20/2012   HCT 39.9 05/20/2012   PLT 233.0 05/20/2012   GLUCOSE 97 05/20/2012   CHOL 169 05/20/2012   TRIG 75.0 05/20/2012   HDL 66.60 05/20/2012   LDLDIRECT 125.4 05/10/2011   LDLCALC 87 05/20/2012   ALT 16 05/20/2012   AST 23 05/20/2012   NA 140 05/20/2012   K 4.1 05/20/2012   CL 105 05/20/2012   CREATININE 0.7 05/20/2012   BUN 15 05/20/2012   CO2 31 05/20/2012   TSH 1.80 05/20/2012   HGBA1C 5.6 09/11/2008          Assessment & Plan:

## 2012-11-20 NOTE — Assessment & Plan Note (Signed)
Continue with current prescription therapy as reflected on the Med list. Better 

## 2012-12-19 DIAGNOSIS — H409 Unspecified glaucoma: Secondary | ICD-10-CM | POA: Diagnosis not present

## 2012-12-19 DIAGNOSIS — H4011X0 Primary open-angle glaucoma, stage unspecified: Secondary | ICD-10-CM | POA: Diagnosis not present

## 2013-01-09 ENCOUNTER — Telehealth: Payer: Self-pay | Admitting: *Deleted

## 2013-01-10 ENCOUNTER — Telehealth: Payer: Self-pay | Admitting: *Deleted

## 2013-01-10 NOTE — Telephone Encounter (Signed)
Pt called states she spoke with Md yesterday in reference to increasing Celebrex from 100mg  to 200mg .  Further states increase was not sent to pharmacy.  Please advise

## 2013-01-10 NOTE — Telephone Encounter (Signed)
A user error has taken place.

## 2013-01-12 MED ORDER — CELECOXIB 200 MG PO CAPS: 200.0000 mg | ORAL_CAPSULE | Freq: Two times a day (BID) | ORAL | Status: DC

## 2013-01-12 NOTE — Telephone Encounter (Signed)
Done. Thx.

## 2013-01-13 NOTE — Telephone Encounter (Signed)
Addressed by MD

## 2013-01-17 ENCOUNTER — Other Ambulatory Visit: Payer: Self-pay | Admitting: *Deleted

## 2013-01-17 MED ORDER — CELECOXIB 200 MG PO CAPS: 200.0000 mg | ORAL_CAPSULE | Freq: Every day | ORAL | Status: DC

## 2013-02-18 ENCOUNTER — Telehealth: Payer: Self-pay | Admitting: *Deleted

## 2013-02-18 NOTE — Telephone Encounter (Signed)
OK to fill this prescription with additional refills x11 Thank you!  

## 2013-02-18 NOTE — Telephone Encounter (Signed)
Rf req for progesterone 200 mg 1 po qhs on days 1-12 of every 3rd month. # 12. Ok to Rf? Med is not on our list. Last prescribed by Theresa Hampton.

## 2013-02-18 NOTE — Telephone Encounter (Signed)
Also, Rf req for Estradiol 0.05mg  change patch weekly # 12. Ok to Rf?

## 2013-02-20 MED ORDER — PROGESTERONE MICRONIZED 200 MG PO CAPS: ORAL_CAPSULE | ORAL | Status: DC

## 2013-02-20 MED ORDER — ESTRADIOL 0.05 MG/24HR TD PTWK: 1.0000 | MEDICATED_PATCH | TRANSDERMAL | Status: DC

## 2013-02-20 NOTE — Telephone Encounter (Signed)
Done

## 2013-02-26 ENCOUNTER — Ambulatory Visit (INDEPENDENT_AMBULATORY_CARE_PROVIDER_SITE_OTHER): Payer: Medicare Other | Admitting: Internal Medicine

## 2013-02-26 ENCOUNTER — Encounter: Payer: Self-pay | Admitting: Internal Medicine

## 2013-02-26 VITALS — BP 140/76 | HR 72 | Temp 97.0°F | Resp 16 | Wt 118.0 lb

## 2013-02-26 DIAGNOSIS — E538 Deficiency of other specified B group vitamins: Secondary | ICD-10-CM

## 2013-02-26 DIAGNOSIS — Z23 Encounter for immunization: Secondary | ICD-10-CM | POA: Diagnosis not present

## 2013-02-26 DIAGNOSIS — E559 Vitamin D deficiency, unspecified: Secondary | ICD-10-CM

## 2013-02-26 DIAGNOSIS — M25559 Pain in unspecified hip: Secondary | ICD-10-CM

## 2013-02-26 DIAGNOSIS — E785 Hyperlipidemia, unspecified: Secondary | ICD-10-CM | POA: Diagnosis not present

## 2013-02-26 DIAGNOSIS — M25552 Pain in left hip: Secondary | ICD-10-CM

## 2013-02-26 MED ORDER — PROGESTERONE MICRONIZED 200 MG PO CAPS: ORAL_CAPSULE | ORAL | Status: DC

## 2013-02-26 MED ORDER — TRAMADOL HCL 50 MG PO TABS: ORAL_TABLET | ORAL | Status: DC

## 2013-02-26 MED ORDER — ESTRADIOL 0.025 MG/24HR TD PTWK: 1.0000 | MEDICATED_PATCH | TRANSDERMAL | Status: DC

## 2013-02-26 NOTE — Assessment & Plan Note (Signed)
Continue with current prescription therapy as reflected on the Med list.  

## 2013-02-26 NOTE — Assessment & Plan Note (Signed)
Resolved

## 2013-02-26 NOTE — Progress Notes (Signed)
   Subjective:    HPI  The patient presents for a follow-up of  chronic hypertension, chronic dyslipidemia, GERD controlled with medicines F/u L groin/hip pain - she is getting injections now - they help - resolved Pt wants to get off Climara patches...   Review of Systems  Constitutional: Negative for chills, activity change, appetite change, fatigue and unexpected weight change.  HENT: Negative for congestion, mouth sores and sinus pressure.   Eyes: Negative for visual disturbance.  Respiratory: Negative for cough and chest tightness.   Gastrointestinal: Negative for nausea and abdominal pain.  Genitourinary: Negative for frequency, difficulty urinating and vaginal pain.  Musculoskeletal: Negative for back pain and gait problem.  Skin: Negative for pallor and rash.  Neurological: Negative for dizziness, tremors, weakness, numbness and headaches.  Psychiatric/Behavioral: Negative for confusion and sleep disturbance.   Wt Readings from Last 3 Encounters:  02/26/13 118 lb (53.524 kg)  11/20/12 114 lb (51.71 kg)  08/21/12 117 lb (53.071 kg)   BP Readings from Last 3 Encounters:  02/26/13 140/76  11/20/12 140/82  08/21/12 142/74       Objective:   Physical Exam  Constitutional: She appears well-developed. No distress.  HENT:  Head: Normocephalic.  Right Ear: External ear normal.  Left Ear: External ear normal.  Nose: Nose normal.  Mouth/Throat: Oropharynx is clear and moist.  Eyes: Conjunctivae are normal. Pupils are equal, round, and reactive to light. Right eye exhibits no discharge. Left eye exhibits no discharge.  Neck: Normal range of motion. Neck supple. No JVD present. No tracheal deviation present. No thyromegaly present.  Cardiovascular: Normal rate, regular rhythm and normal heart sounds.   Pulmonary/Chest: No stridor. No respiratory distress. She has no wheezes.  Abdominal: Soft. Bowel sounds are normal. She exhibits no distension and no mass. There is no  tenderness. There is no rebound and no guarding.  Musculoskeletal: She exhibits no edema and no tenderness.  Lymphadenopathy:    She has no cervical adenopathy.  Neurological: She displays normal reflexes. No cranial nerve deficit. She exhibits normal muscle tone. Coordination normal.  Skin: No rash noted. No erythema.  Psychiatric: She has a normal mood and affect. Her behavior is normal. Judgment and thought content normal.  L hip is not painful w/ROM     Lab Results  Component Value Date   WBC 6.9 05/20/2012   HGB 13.2 05/20/2012   HCT 39.9 05/20/2012   PLT 233.0 05/20/2012   GLUCOSE 106* 11/20/2012   CHOL 169 05/20/2012   TRIG 75.0 05/20/2012   HDL 66.60 05/20/2012   LDLDIRECT 125.4 05/10/2011   LDLCALC 87 05/20/2012   ALT 14 11/20/2012   AST 20 11/20/2012   NA 137 11/20/2012   K 4.0 11/20/2012   CL 100 11/20/2012   CREATININE 0.7 11/20/2012   BUN 10 11/20/2012   CO2 29 11/20/2012   TSH 1.80 05/20/2012   HGBA1C 5.6 09/11/2008          Assessment & Plan:

## 2013-03-06 ENCOUNTER — Encounter: Payer: Self-pay | Admitting: Internal Medicine

## 2013-03-19 DIAGNOSIS — M169 Osteoarthritis of hip, unspecified: Secondary | ICD-10-CM | POA: Diagnosis not present

## 2013-04-07 ENCOUNTER — Ambulatory Visit (INDEPENDENT_AMBULATORY_CARE_PROVIDER_SITE_OTHER)
Admission: RE | Admit: 2013-04-07 | Discharge: 2013-04-07 | Disposition: A | Discharge status: Home / Self Care | Payer: Medicare Other | Source: Ambulatory Visit | Attending: Internal Medicine | Admitting: Internal Medicine

## 2013-04-07 ENCOUNTER — Other Ambulatory Visit (INDEPENDENT_AMBULATORY_CARE_PROVIDER_SITE_OTHER): Payer: Medicare Other

## 2013-04-07 ENCOUNTER — Ambulatory Visit (INDEPENDENT_AMBULATORY_CARE_PROVIDER_SITE_OTHER): Payer: Medicare Other | Admitting: Internal Medicine

## 2013-04-07 ENCOUNTER — Encounter: Payer: Self-pay | Admitting: Internal Medicine

## 2013-04-07 VITALS — BP 170/88 | HR 92 | Temp 98.2°F | Resp 16 | Wt 115.0 lb

## 2013-04-07 DIAGNOSIS — N10 Acute tubulo-interstitial nephritis: Secondary | ICD-10-CM

## 2013-04-07 DIAGNOSIS — R110 Nausea: Secondary | ICD-10-CM | POA: Diagnosis not present

## 2013-04-07 DIAGNOSIS — R140 Abdominal distension (gaseous): Secondary | ICD-10-CM | POA: Insufficient documentation

## 2013-04-07 DIAGNOSIS — R141 Gas pain: Secondary | ICD-10-CM | POA: Diagnosis not present

## 2013-04-07 DIAGNOSIS — R000 Tachycardia, unspecified: Secondary | ICD-10-CM | POA: Diagnosis not present

## 2013-04-07 DIAGNOSIS — R109 Unspecified abdominal pain: Secondary | ICD-10-CM | POA: Diagnosis not present

## 2013-04-07 DIAGNOSIS — R002 Palpitations: Secondary | ICD-10-CM | POA: Diagnosis not present

## 2013-04-07 LAB — CBC WITH DIFFERENTIAL/PLATELET
Basophils Absolute: 0 10*3/uL (ref 0.0–0.1)
Eosinophils Absolute: 0.2 10*3/uL (ref 0.0–0.7)
HCT: 39.7 % (ref 36.0–46.0)
Lymphs Abs: 1.6 10*3/uL (ref 0.7–4.0)
MCHC: 33.1 g/dL (ref 30.0–36.0)
MCV: 86.9 fl (ref 78.0–100.0)
Monocytes Absolute: 0.9 10*3/uL (ref 0.1–1.0)
Platelets: 257 10*3/uL (ref 150.0–400.0)
RDW: 13.3 % (ref 11.5–14.6)

## 2013-04-07 LAB — URINALYSIS, ROUTINE W REFLEX MICROSCOPIC
Nitrite: NEGATIVE
Specific Gravity, Urine: 1.025 (ref 1.000–1.030)
Total Protein, Urine: NEGATIVE
Urine Glucose: NEGATIVE
Urobilinogen, UA: 1 (ref 0.0–1.0)
pH: 5.5 (ref 5.0–8.0)

## 2013-04-07 LAB — HEPATIC FUNCTION PANEL
ALT: 11 U/L (ref 0–35)
AST: 18 U/L (ref 0–37)
Alkaline Phosphatase: 61 U/L (ref 39–117)
Bilirubin, Direct: 0.1 mg/dL (ref 0.0–0.3)
Total Bilirubin: 0.5 mg/dL (ref 0.3–1.2)

## 2013-04-07 LAB — BASIC METABOLIC PANEL
Chloride: 103 mEq/L (ref 96–112)
GFR: 79.43 mL/min (ref >60.00)
Potassium: 3.4 mEq/L — ABNORMAL LOW (ref 3.5–5.1)
Sodium: 141 mEq/L (ref 135–145)

## 2013-04-07 LAB — LIPASE: Lipase: 20 U/L (ref 11.0–59.0)

## 2013-04-07 MED ORDER — ONDANSETRON HCL 4 MG PO TABS: 4.0000 mg | ORAL_TABLET | Freq: Three times a day (TID) | ORAL | Status: DC | PRN

## 2013-04-07 MED ORDER — ESOMEPRAZOLE MAGNESIUM 20 MG PO CPDR: 20.0000 mg | DELAYED_RELEASE_CAPSULE | Freq: Two times a day (BID) | ORAL | Status: DC

## 2013-04-07 MED ORDER — CIPROFLOXACIN HCL 500 MG PO TABS: 500.0000 mg | ORAL_TABLET | Freq: Two times a day (BID) | ORAL | Status: DC

## 2013-04-07 NOTE — Assessment & Plan Note (Addendum)
Poss diverticulitis Labs Hold Simvastatin, Celebrex Stat Nexiun, Zofran

## 2013-04-07 NOTE — Patient Instructions (Addendum)
Go to ER if worse Poss diverticulitis

## 2013-04-07 NOTE — Assessment & Plan Note (Signed)
12/14 ?etiol - seems GI related Labs

## 2013-04-07 NOTE — Progress Notes (Signed)
Pre visit review using our clinic review tool, if applicable. No additional management support is needed unless otherwise documented below in the visit note. 

## 2013-04-07 NOTE — Progress Notes (Signed)
   Subjective:    HPI C/o rapid HR and elevated BP episodes x3 since last Wed. C/o nausea all the time. C/o HA. C/o gas, bloating, early satiety. No BM since Wed when she had diarrhea...  The patient presents for a follow-up of  chronic hypertension, chronic dyslipidemia, GERD controlled with medicines F/u L groin/hip pain - she is getting injections now - they help - resolved Pt wants to get off Climara patches...   Review of Systems  Constitutional: Negative for chills, activity change, appetite change, fatigue and unexpected weight change.  HENT: Negative for congestion, mouth sores and sinus pressure.   Eyes: Negative for visual disturbance.  Respiratory: Negative for cough and chest tightness.   Gastrointestinal: Negative for nausea and abdominal pain.  Genitourinary: Negative for frequency, difficulty urinating and vaginal pain.  Musculoskeletal: Negative for back pain and gait problem.  Skin: Negative for pallor and rash.  Neurological: Negative for dizziness, tremors, weakness, numbness and headaches.  Psychiatric/Behavioral: Negative for confusion and sleep disturbance.   Wt Readings from Last 3 Encounters:  04/07/13 115 lb (52.164 kg)  02/26/13 118 lb (53.524 kg)  11/20/12 114 lb (51.71 kg)   BP Readings from Last 3 Encounters:  04/07/13 170/88  02/26/13 140/76  11/20/12 140/82       Objective:   Physical Exam  Constitutional: She appears well-developed. No distress.  HENT:  Head: Normocephalic.  Right Ear: External ear normal.  Left Ear: External ear normal.  Nose: Nose normal.  Mouth/Throat: Oropharynx is clear and moist.  Eyes: Conjunctivae are normal. Pupils are equal, round, and reactive to light. Right eye exhibits no discharge. Left eye exhibits no discharge.  Neck: Normal range of motion. Neck supple. No JVD present. No tracheal deviation present. No thyromegaly present.  Cardiovascular: Normal rate, regular rhythm and normal heart sounds.    Pulmonary/Chest: No stridor. No respiratory distress. She has no wheezes.  Abdominal: Soft. Bowel sounds are normal. She exhibits no distension and no mass. There is no tenderness. There is no rebound and no guarding.  Musculoskeletal: She exhibits no edema and no tenderness.  Lymphadenopathy:    She has no cervical adenopathy.  Neurological: She displays normal reflexes. No cranial nerve deficit. She exhibits normal muscle tone. Coordination normal.  Skin: No rash noted. No erythema.  Psychiatric: She has a normal mood and affect. Her behavior is normal. Judgment and thought content normal.       Lab Results  Component Value Date   WBC 6.9 05/20/2012   HGB 13.2 05/20/2012   HCT 39.9 05/20/2012   PLT 233.0 05/20/2012   GLUCOSE 106* 11/20/2012   CHOL 169 05/20/2012   TRIG 75.0 05/20/2012   HDL 66.60 05/20/2012   LDLDIRECT 125.4 05/10/2011   LDLCALC 87 05/20/2012   ALT 14 11/20/2012   AST 20 11/20/2012   NA 137 11/20/2012   K 4.0 11/20/2012   CL 100 11/20/2012   CREATININE 0.7 11/20/2012   BUN 10 11/20/2012   CO2 29 11/20/2012   TSH 1.80 05/20/2012   HGBA1C 5.6 09/11/2008          Assessment & Plan:

## 2013-04-09 ENCOUNTER — Encounter: Payer: Self-pay | Admitting: Internal Medicine

## 2013-04-09 ENCOUNTER — Ambulatory Visit (INDEPENDENT_AMBULATORY_CARE_PROVIDER_SITE_OTHER): Payer: Medicare Other | Admitting: Internal Medicine

## 2013-04-09 VITALS — BP 172/82 | HR 76 | Temp 98.2°F | Resp 16

## 2013-04-09 DIAGNOSIS — R141 Gas pain: Secondary | ICD-10-CM

## 2013-04-09 DIAGNOSIS — N10 Acute tubulo-interstitial nephritis: Secondary | ICD-10-CM | POA: Diagnosis not present

## 2013-04-09 DIAGNOSIS — R140 Abdominal distension (gaseous): Secondary | ICD-10-CM

## 2013-04-09 DIAGNOSIS — F411 Generalized anxiety disorder: Secondary | ICD-10-CM | POA: Diagnosis not present

## 2013-04-09 DIAGNOSIS — E538 Deficiency of other specified B group vitamins: Secondary | ICD-10-CM

## 2013-04-09 DIAGNOSIS — R110 Nausea: Secondary | ICD-10-CM

## 2013-04-09 MED ORDER — CLONAZEPAM 0.25 MG PO TBDP: 0.2500 mg | ORAL_TABLET | Freq: Two times a day (BID) | ORAL | Status: DC | PRN

## 2013-04-09 NOTE — Assessment & Plan Note (Signed)
Continue with current prescription therapy as reflected on the Med list.  

## 2013-04-09 NOTE — Progress Notes (Signed)
Pre visit review using our clinic review tool, if applicable. No additional management support is needed unless otherwise documented below in the visit note. 

## 2013-04-09 NOTE — Assessment & Plan Note (Signed)
Doing a little better Cont w/Cipro

## 2013-04-09 NOTE — Assessment & Plan Note (Signed)
12/14 due to pyelonephritis Resolved

## 2013-04-09 NOTE — Assessment & Plan Note (Signed)
05/19/12 due to  pyelonephritis  Better

## 2013-04-09 NOTE — Assessment & Plan Note (Signed)
12/14 due to pyelo Clonazepam prn

## 2013-04-15 ENCOUNTER — Other Ambulatory Visit: Payer: Self-pay | Admitting: Internal Medicine

## 2013-04-15 NOTE — Assessment & Plan Note (Addendum)
Labs Abd X ray Empiric abx

## 2013-04-15 NOTE — Assessment & Plan Note (Deleted)
Cont abx

## 2013-04-16 ENCOUNTER — Other Ambulatory Visit: Payer: Self-pay | Admitting: Internal Medicine

## 2013-04-16 DIAGNOSIS — N390 Urinary tract infection, site not specified: Secondary | ICD-10-CM

## 2013-04-16 MED ORDER — ESTRADIOL 0.05 MG/24HR TD PTWK: 0.0500 mg | MEDICATED_PATCH | TRANSDERMAL | Status: DC

## 2013-04-17 ENCOUNTER — Other Ambulatory Visit (INDEPENDENT_AMBULATORY_CARE_PROVIDER_SITE_OTHER): Payer: Medicare Other

## 2013-04-17 DIAGNOSIS — N390 Urinary tract infection, site not specified: Secondary | ICD-10-CM | POA: Diagnosis not present

## 2013-04-17 LAB — URINALYSIS, ROUTINE W REFLEX MICROSCOPIC
Bilirubin Urine: NEGATIVE
Hgb urine dipstick: NEGATIVE
Nitrite: NEGATIVE
Total Protein, Urine: NEGATIVE
Urine Glucose: NEGATIVE

## 2013-04-28 ENCOUNTER — Telehealth: Payer: Self-pay

## 2013-04-28 DIAGNOSIS — N10 Acute tubulo-interstitial nephritis: Secondary | ICD-10-CM

## 2013-04-28 NOTE — Telephone Encounter (Signed)
OK. Thx

## 2013-04-28 NOTE — Telephone Encounter (Signed)
The patient called and stated she took her last antibiotic this am. She is hoping to go to the lab and drop off another urine specimen.  Pt's callback back [phone]

## 2013-04-29 ENCOUNTER — Other Ambulatory Visit (INDEPENDENT_AMBULATORY_CARE_PROVIDER_SITE_OTHER): Payer: Medicare Other

## 2013-04-29 DIAGNOSIS — N10 Acute tubulo-interstitial nephritis: Secondary | ICD-10-CM

## 2013-04-29 LAB — URINALYSIS, ROUTINE W REFLEX MICROSCOPIC
Bilirubin Urine: NEGATIVE
Ketones, ur: NEGATIVE
Nitrite: NEGATIVE
Total Protein, Urine: NEGATIVE
pH: 6.5 (ref 5.0–8.0)

## 2013-04-29 NOTE — Telephone Encounter (Signed)
UA ordered. Pt informed  

## 2013-04-30 ENCOUNTER — Other Ambulatory Visit: Payer: Self-pay | Admitting: Internal Medicine

## 2013-04-30 MED ORDER — FLUCONAZOLE 150 MG PO TABS: 150.0000 mg | ORAL_TABLET | Freq: Once | ORAL | Status: DC

## 2013-05-07 ENCOUNTER — Encounter: Payer: Self-pay | Admitting: Internal Medicine

## 2013-05-07 ENCOUNTER — Ambulatory Visit (INDEPENDENT_AMBULATORY_CARE_PROVIDER_SITE_OTHER): Payer: Medicare Other | Admitting: Internal Medicine

## 2013-05-07 VITALS — BP 158/80 | HR 80 | Temp 97.0°F | Resp 16 | Wt 118.0 lb

## 2013-05-07 DIAGNOSIS — R110 Nausea: Secondary | ICD-10-CM

## 2013-05-07 DIAGNOSIS — E538 Deficiency of other specified B group vitamins: Secondary | ICD-10-CM | POA: Diagnosis not present

## 2013-05-07 DIAGNOSIS — R141 Gas pain: Secondary | ICD-10-CM

## 2013-05-07 DIAGNOSIS — N10 Acute tubulo-interstitial nephritis: Secondary | ICD-10-CM

## 2013-05-07 DIAGNOSIS — R140 Abdominal distension (gaseous): Secondary | ICD-10-CM

## 2013-05-07 NOTE — Assessment & Plan Note (Signed)
11/14 due to UTI - resolving

## 2013-05-07 NOTE — Assessment & Plan Note (Signed)
11/14 - resolved UA

## 2013-05-07 NOTE — Progress Notes (Signed)
   Subjective:    HPI F/u UTI F/u rapid HR and elevated BP - resolved. F/u  nausea all the time - much better. No HA. F/u  gas, bloating, early satiety - resolved. No BM since Wed when she had diarrhea...  The patient presents for a follow-up of  chronic hypertension, chronic dyslipidemia, GERD controlled with medicines F/u L groin/hip pain - she is getting injections now - they help - resolved    Review of Systems  Constitutional: Negative for chills, activity change, appetite change, fatigue and unexpected weight change.  HENT: Negative for congestion, mouth sores and sinus pressure.   Eyes: Negative for visual disturbance.  Respiratory: Negative for cough and chest tightness.   Gastrointestinal: Negative for nausea and abdominal pain.  Genitourinary: Negative for frequency, difficulty urinating and vaginal pain.  Musculoskeletal: Negative for back pain and gait problem.  Skin: Negative for pallor and rash.  Neurological: Negative for dizziness, tremors, weakness, numbness and headaches.  Psychiatric/Behavioral: Negative for confusion and sleep disturbance.   Wt Readings from Last 3 Encounters:  05/07/13 118 lb (53.524 kg)  04/07/13 115 lb (52.164 kg)  02/26/13 118 lb (53.524 kg)   BP Readings from Last 3 Encounters:  05/07/13 158/80  04/09/13 172/82  04/07/13 170/88       Objective:   Physical Exam  Constitutional: She appears well-developed. No distress.  HENT:  Head: Normocephalic.  Right Ear: External ear normal.  Left Ear: External ear normal.  Nose: Nose normal.  Mouth/Throat: Oropharynx is clear and moist.  Eyes: Conjunctivae are normal. Pupils are equal, round, and reactive to light. Right eye exhibits no discharge. Left eye exhibits no discharge.  Neck: Normal range of motion. Neck supple. No JVD present. No tracheal deviation present. No thyromegaly present.  Cardiovascular: Normal rate, regular rhythm and normal heart sounds.   Pulmonary/Chest: No  stridor. No respiratory distress. She has no wheezes.  Abdominal: Soft. Bowel sounds are normal. She exhibits no distension and no mass. There is no tenderness. There is no rebound and no guarding.  Musculoskeletal: She exhibits no edema and no tenderness.  Lymphadenopathy:    She has no cervical adenopathy.  Neurological: She displays normal reflexes. No cranial nerve deficit. She exhibits normal muscle tone. Coordination normal.  Skin: No rash noted. No erythema.  Psychiatric: She has a normal mood and affect. Her behavior is normal. Judgment and thought content normal.       Lab Results  Component Value Date   WBC 9.2 04/07/2013   HGB 13.1 04/07/2013   HCT 39.7 04/07/2013   PLT 257.0 04/07/2013   GLUCOSE 122* 04/07/2013   CHOL 169 05/20/2012   TRIG 75.0 05/20/2012   HDL 66.60 05/20/2012   LDLDIRECT 125.4 05/10/2011   LDLCALC 87 05/20/2012   ALT 11 04/07/2013   AST 18 04/07/2013   NA 141 04/07/2013   K 3.4* 04/07/2013   CL 103 04/07/2013   CREATININE 0.7 04/07/2013   BUN 12 04/07/2013   CO2 32 04/07/2013   TSH 2.13 04/07/2013   HGBA1C 5.6 09/11/2008          Assessment & Plan:

## 2013-05-07 NOTE — Assessment & Plan Note (Signed)
Continue with current prescription therapy as reflected on the Med list.  

## 2013-05-07 NOTE — Progress Notes (Signed)
Pre visit review using our clinic review tool, if applicable. No additional management support is needed unless otherwise documented below in the visit note. 

## 2013-05-07 NOTE — Assessment & Plan Note (Signed)
11/14 due to UTI - resolved

## 2013-06-02 ENCOUNTER — Ambulatory Visit (INDEPENDENT_AMBULATORY_CARE_PROVIDER_SITE_OTHER): Payer: Medicare Other

## 2013-06-02 DIAGNOSIS — R140 Abdominal distension (gaseous): Secondary | ICD-10-CM

## 2013-06-02 DIAGNOSIS — R110 Nausea: Secondary | ICD-10-CM | POA: Diagnosis not present

## 2013-06-02 DIAGNOSIS — R141 Gas pain: Secondary | ICD-10-CM

## 2013-06-02 DIAGNOSIS — N10 Acute tubulo-interstitial nephritis: Secondary | ICD-10-CM | POA: Diagnosis not present

## 2013-06-02 DIAGNOSIS — R143 Flatulence: Secondary | ICD-10-CM

## 2013-06-02 DIAGNOSIS — R252 Cramp and spasm: Secondary | ICD-10-CM

## 2013-06-02 DIAGNOSIS — R142 Eructation: Secondary | ICD-10-CM

## 2013-06-02 DIAGNOSIS — E538 Deficiency of other specified B group vitamins: Secondary | ICD-10-CM

## 2013-06-02 LAB — CBC WITH DIFFERENTIAL/PLATELET
BASOS ABS: 0 10*3/uL (ref 0.0–0.1)
Basophils Relative: 0.5 % (ref 0.0–3.0)
Eosinophils Absolute: 0.5 10*3/uL (ref 0.0–0.7)
Eosinophils Relative: 5.7 % — ABNORMAL HIGH (ref 0.0–5.0)
HEMATOCRIT: 38.7 % (ref 36.0–46.0)
HEMOGLOBIN: 13 g/dL (ref 12.0–15.0)
LYMPHS ABS: 1.5 10*3/uL (ref 0.7–4.0)
Lymphocytes Relative: 19.2 % (ref 12.0–46.0)
MCHC: 33.7 g/dL (ref 30.0–36.0)
MCV: 85.8 fl (ref 78.0–100.0)
MONO ABS: 0.6 10*3/uL (ref 0.1–1.0)
Monocytes Relative: 7.5 % (ref 3.0–12.0)
NEUTROS ABS: 5.3 10*3/uL (ref 1.4–7.7)
Neutrophils Relative %: 67.1 % (ref 43.0–77.0)
Platelets: 256 10*3/uL (ref 150.0–400.0)
RBC: 4.51 Mil/uL (ref 3.87–5.11)
RDW: 12.9 % (ref 11.5–14.6)
WBC: 7.9 10*3/uL (ref 4.5–10.5)

## 2013-06-02 LAB — URINALYSIS, ROUTINE W REFLEX MICROSCOPIC
Bilirubin Urine: NEGATIVE
HGB URINE DIPSTICK: NEGATIVE
Ketones, ur: NEGATIVE
NITRITE: NEGATIVE
SPECIFIC GRAVITY, URINE: 1.025 (ref 1.000–1.030)
Total Protein, Urine: NEGATIVE
UROBILINOGEN UA: 0.2 (ref 0.0–1.0)
Urine Glucose: NEGATIVE
pH: 5.5 (ref 5.0–8.0)

## 2013-06-02 LAB — HEPATIC FUNCTION PANEL
ALT: 9 U/L (ref 0–35)
AST: 19 U/L (ref 0–37)
Albumin: 3.6 g/dL (ref 3.5–5.2)
Alkaline Phosphatase: 66 U/L (ref 39–117)
BILIRUBIN TOTAL: 0.6 mg/dL (ref 0.3–1.2)
Bilirubin, Direct: 0.1 mg/dL (ref 0.0–0.3)
Total Protein: 7.3 g/dL (ref 6.0–8.3)

## 2013-06-02 LAB — BASIC METABOLIC PANEL
BUN: 11 mg/dL (ref 6–23)
CALCIUM: 9.4 mg/dL (ref 8.4–10.5)
CHLORIDE: 104 meq/L (ref 96–112)
CO2: 30 mEq/L (ref 19–32)
CREATININE: 0.7 mg/dL (ref 0.4–1.2)
GFR: 87.67 mL/min (ref >60.00)
Glucose, Bld: 90 mg/dL (ref 70–99)
Potassium: 4 mEq/L (ref 3.5–5.1)
Sodium: 140 mEq/L (ref 135–145)

## 2013-06-03 LAB — BASIC METABOLIC PANEL WITH GFR
BUN: 11 mg/dL (ref 6–23)
CO2: 25 meq/L (ref 19–32)
Calcium: 9.7 mg/dL (ref 8.4–10.5)
Chloride: 102 mEq/L (ref 96–112)
Creat: 0.82 mg/dL (ref 0.50–1.10)
GFR, EST AFRICAN AMERICAN: 72 mL/min
GFR, Est Non African American: 63 mL/min
GLUCOSE: 85 mg/dL (ref 70–99)
POTASSIUM: 4.3 meq/L (ref 3.5–5.3)
SODIUM: 140 meq/L (ref 135–145)

## 2013-06-04 ENCOUNTER — Ambulatory Visit (INDEPENDENT_AMBULATORY_CARE_PROVIDER_SITE_OTHER): Payer: Medicare Other | Admitting: Internal Medicine

## 2013-06-04 ENCOUNTER — Encounter: Payer: Self-pay | Admitting: Internal Medicine

## 2013-06-04 VITALS — BP 128/72 | HR 76 | Temp 96.9°F | Resp 16 | Wt 120.0 lb

## 2013-06-04 DIAGNOSIS — Z23 Encounter for immunization: Secondary | ICD-10-CM

## 2013-06-04 DIAGNOSIS — M1990 Unspecified osteoarthritis, unspecified site: Secondary | ICD-10-CM

## 2013-06-04 DIAGNOSIS — M25559 Pain in unspecified hip: Secondary | ICD-10-CM

## 2013-06-04 DIAGNOSIS — N10 Acute tubulo-interstitial nephritis: Secondary | ICD-10-CM | POA: Diagnosis not present

## 2013-06-04 DIAGNOSIS — M25552 Pain in left hip: Secondary | ICD-10-CM

## 2013-06-04 DIAGNOSIS — E538 Deficiency of other specified B group vitamins: Secondary | ICD-10-CM

## 2013-06-04 MED ORDER — CELECOXIB 200 MG PO CAPS: 200.0000 mg | ORAL_CAPSULE | Freq: Every day | ORAL | Status: DC

## 2013-06-04 NOTE — Assessment & Plan Note (Signed)
Resolved

## 2013-06-04 NOTE — Progress Notes (Signed)
Pre visit review using our clinic review tool, if applicable. No additional management support is needed unless otherwise documented below in the visit note. 

## 2013-06-04 NOTE — Assessment & Plan Note (Signed)
Continue with current prescription therapy as reflected on the Med list. Another hip inj is pending

## 2013-06-04 NOTE — Assessment & Plan Note (Signed)
Continue with current prescription therapy as reflected on the Med list.  

## 2013-06-04 NOTE — Assessment & Plan Note (Signed)
Generic Celebrex 200 mg qd

## 2013-06-04 NOTE — Progress Notes (Signed)
   Subjective:    HPI  F/u UTI  F/u rapid HR and elevated BP - resolved. F/u  nausea all the time - much better. No HA. F/u  gas, bloating, early satiety - resolved. No BM since Wed when she had diarrhea...  The patient presents for a follow-up of  chronic hypertension, chronic dyslipidemia, GERD controlled with medicines F/u L groin/hip pain - she is getting injections now - they help - resolved    Review of Systems  Constitutional: Negative for chills, activity change, appetite change, fatigue and unexpected weight change.  HENT: Negative for congestion, mouth sores and sinus pressure.   Eyes: Negative for visual disturbance.  Respiratory: Negative for cough and chest tightness.   Gastrointestinal: Negative for nausea and abdominal pain.  Genitourinary: Negative for frequency, difficulty urinating and vaginal pain.  Musculoskeletal: Negative for back pain and gait problem.  Skin: Negative for pallor and rash.  Neurological: Negative for dizziness, tremors, weakness, numbness and headaches.  Psychiatric/Behavioral: Negative for confusion and sleep disturbance.   Wt Readings from Last 3 Encounters:  06/04/13 120 lb (54.432 kg)  05/07/13 118 lb (53.524 kg)  04/07/13 115 lb (52.164 kg)   BP Readings from Last 3 Encounters:  06/04/13 128/72  05/07/13 158/80  04/09/13 172/82       Objective:   Physical Exam  Constitutional: She appears well-developed. No distress.  HENT:  Head: Normocephalic.  Right Ear: External ear normal.  Left Ear: External ear normal.  Nose: Nose normal.  Mouth/Throat: Oropharynx is clear and moist.  Eyes: Conjunctivae are normal. Pupils are equal, round, and reactive to light. Right eye exhibits no discharge. Left eye exhibits no discharge.  Neck: Normal range of motion. Neck supple. No JVD present. No tracheal deviation present. No thyromegaly present.  Cardiovascular: Normal rate, regular rhythm and normal heart sounds.   Pulmonary/Chest: No  stridor. No respiratory distress. She has no wheezes.  Abdominal: Soft. Bowel sounds are normal. She exhibits no distension and no mass. There is no tenderness. There is no rebound and no guarding.  Musculoskeletal: She exhibits no edema and no tenderness.  Lymphadenopathy:    She has no cervical adenopathy.  Neurological: She displays normal reflexes. No cranial nerve deficit. She exhibits normal muscle tone. Coordination normal.  Skin: No rash noted. No erythema.  Psychiatric: She has a normal mood and affect. Her behavior is normal. Judgment and thought content normal.       Lab Results  Component Value Date   WBC 7.9 06/02/2013   HGB 13.0 06/02/2013   HCT 38.7 06/02/2013   PLT 256.0 06/02/2013   GLUCOSE 85 06/02/2013   GLUCOSE 90 06/02/2013   CHOL 169 05/20/2012   TRIG 75.0 05/20/2012   HDL 66.60 05/20/2012   LDLDIRECT 125.4 05/10/2011   LDLCALC 87 05/20/2012   ALT 9 06/02/2013   AST 19 06/02/2013   NA 140 06/02/2013   NA 140 06/02/2013   K 4.3 06/02/2013   K 4.0 06/02/2013   CL 102 06/02/2013   CL 104 06/02/2013   CREATININE 0.82 06/02/2013   CREATININE 0.7 06/02/2013   BUN 11 06/02/2013   BUN 11 06/02/2013   CO2 25 06/02/2013   CO2 30 06/02/2013   TSH 2.13 04/07/2013   HGBA1C 5.6 09/11/2008          Assessment & Plan:

## 2013-06-10 DIAGNOSIS — M1610 Unilateral primary osteoarthritis, unspecified hip: Secondary | ICD-10-CM | POA: Diagnosis not present

## 2013-06-10 DIAGNOSIS — M169 Osteoarthritis of hip, unspecified: Secondary | ICD-10-CM | POA: Diagnosis not present

## 2013-09-03 ENCOUNTER — Ambulatory Visit: Payer: Medicare Other | Admitting: Internal Medicine

## 2013-09-05 ENCOUNTER — Other Ambulatory Visit: Payer: Self-pay | Admitting: *Deleted

## 2013-09-05 MED ORDER — PROGESTERONE MICRONIZED 200 MG PO CAPS: ORAL_CAPSULE | ORAL | Status: DC

## 2013-09-19 ENCOUNTER — Telehealth: Payer: Self-pay | Admitting: *Deleted

## 2013-09-19 MED ORDER — TRAMADOL HCL 50 MG PO TABS: ORAL_TABLET | ORAL | Status: DC

## 2013-09-19 NOTE — Telephone Encounter (Signed)
Rf req for Tramadol 50 mg 1-2 po bid prn pain. # 60. Last filled 07/02/13. Ok to Rf?

## 2013-09-19 NOTE — Telephone Encounter (Signed)
OK to fill this prescription with additional refills x1 Thank you!  

## 2013-09-19 NOTE — Telephone Encounter (Signed)
Done

## 2013-12-19 ENCOUNTER — Telehealth: Payer: Self-pay | Admitting: *Deleted

## 2013-12-19 NOTE — Telephone Encounter (Signed)
Ok to ref [employer]

## 2013-12-19 NOTE — Telephone Encounter (Signed)
Rf req Clobetasol 0.05% apply topically sparingly up to 2 weeks. This med is not in EMR. Ok to Rf?

## 2013-12-22 MED ORDER — CLOBETASOL PROPIONATE 0.05 % EX CREA: 1.0000 "application " | TOPICAL_CREAM | Freq: Two times a day (BID) | CUTANEOUS | Status: DC

## 2013-12-22 NOTE — Telephone Encounter (Signed)
Done

## 2013-12-29 ENCOUNTER — Other Ambulatory Visit: Payer: Self-pay | Admitting: Internal Medicine

## 2013-12-31 ENCOUNTER — Encounter: Payer: Self-pay | Admitting: Internal Medicine

## 2013-12-31 ENCOUNTER — Other Ambulatory Visit (INDEPENDENT_AMBULATORY_CARE_PROVIDER_SITE_OTHER): Payer: Medicare Other

## 2013-12-31 ENCOUNTER — Ambulatory Visit (INDEPENDENT_AMBULATORY_CARE_PROVIDER_SITE_OTHER): Payer: Medicare Other | Admitting: Internal Medicine

## 2013-12-31 VITALS — BP 124/70 | HR 95 | Temp 98.1°F | Resp 12 | Wt 118.0 lb

## 2013-12-31 DIAGNOSIS — E538 Deficiency of other specified B group vitamins: Secondary | ICD-10-CM | POA: Diagnosis not present

## 2013-12-31 DIAGNOSIS — Z23 Encounter for immunization: Secondary | ICD-10-CM | POA: Diagnosis not present

## 2013-12-31 DIAGNOSIS — M25559 Pain in unspecified hip: Secondary | ICD-10-CM

## 2013-12-31 DIAGNOSIS — E785 Hyperlipidemia, unspecified: Secondary | ICD-10-CM | POA: Diagnosis not present

## 2013-12-31 DIAGNOSIS — E559 Vitamin D deficiency, unspecified: Secondary | ICD-10-CM

## 2013-12-31 DIAGNOSIS — R29898 Other symptoms and signs involving the musculoskeletal system: Secondary | ICD-10-CM

## 2013-12-31 DIAGNOSIS — J309 Allergic rhinitis, unspecified: Secondary | ICD-10-CM

## 2013-12-31 DIAGNOSIS — M25552 Pain in left hip: Secondary | ICD-10-CM

## 2013-12-31 LAB — HEPATIC FUNCTION PANEL
ALBUMIN: 3.6 g/dL (ref 3.5–5.2)
ALT: 10 U/L (ref 0–35)
AST: 16 U/L (ref 0–37)
Alkaline Phosphatase: 74 U/L (ref 39–117)
BILIRUBIN TOTAL: 0.5 mg/dL (ref 0.2–1.2)
Bilirubin, Direct: 0.1 mg/dL (ref 0.0–0.3)
Total Protein: 7.4 g/dL (ref 6.0–8.3)

## 2013-12-31 LAB — BASIC METABOLIC PANEL
BUN: 17 mg/dL (ref 6–23)
CO2: 27 mEq/L (ref 19–32)
Calcium: 9.3 mg/dL (ref 8.4–10.5)
Chloride: 104 mEq/L (ref 96–112)
Creatinine, Ser: 0.7 mg/dL (ref 0.4–1.2)
GFR: 84.63 mL/min (ref >60.00)
GLUCOSE: 97 mg/dL (ref 70–99)
Potassium: 4.1 mEq/L (ref 3.5–5.1)
Sodium: 139 mEq/L (ref 135–145)

## 2013-12-31 MED ORDER — GABAPENTIN 100 MG PO CAPS: ORAL_CAPSULE | ORAL | Status: DC

## 2013-12-31 MED ORDER — TRAMADOL HCL 50 MG PO TABS: ORAL_TABLET | ORAL | Status: DC

## 2013-12-31 NOTE — Assessment & Plan Note (Signed)
No change 

## 2013-12-31 NOTE — Progress Notes (Signed)
   Subjective:    HPI   F/u rapid HR and elevated BP - resolved. F/u  nausea all the time - much better. No HA. F/u  gas, bloating, early satiety - resolved. No BM since Wed when she had diarrhea...  The patient presents for a follow-up of  chronic hypertension, chronic dyslipidemia, GERD controlled with medicines.  F/u L groin/hip pain - she is getting injections now - they help - resolved.    Review of Systems  Constitutional: Negative for chills, activity change, appetite change, fatigue and unexpected weight change.  HENT: Negative for congestion, mouth sores and sinus pressure.   Eyes: Negative for visual disturbance.  Respiratory: Negative for cough and chest tightness.   Gastrointestinal: Negative for nausea and abdominal pain.  Genitourinary: Negative for frequency, difficulty urinating and vaginal pain.  Musculoskeletal: Negative for back pain and gait problem.  Skin: Negative for pallor and rash.  Neurological: Negative for dizziness, tremors, weakness, numbness and headaches.  Psychiatric/Behavioral: Negative for confusion and sleep disturbance.   Wt Readings from Last 3 Encounters:  12/31/13 118 lb (53.524 kg)  06/04/13 120 lb (54.432 kg)  05/07/13 118 lb (53.524 kg)   BP Readings from Last 3 Encounters:  12/31/13 124/70  06/04/13 128/72  05/07/13 158/80       Objective:   Physical Exam  Constitutional: She appears well-developed. No distress.  HENT:  Head: Normocephalic.  Right Ear: External ear normal.  Left Ear: External ear normal.  Nose: Nose normal.  Mouth/Throat: Oropharynx is clear and moist.  Eyes: Conjunctivae are normal. Pupils are equal, round, and reactive to light. Right eye exhibits no discharge. Left eye exhibits no discharge.  Neck: Normal range of motion. Neck supple. No JVD present. No tracheal deviation present. No thyromegaly present.  Cardiovascular: Normal rate, regular rhythm and normal heart sounds.   Pulmonary/Chest: No stridor.  No respiratory distress. She has no wheezes.  Abdominal: Soft. Bowel sounds are normal. She exhibits no distension and no mass. There is no tenderness. There is no rebound and no guarding.  Musculoskeletal: She exhibits no edema and no tenderness.  Lymphadenopathy:    She has no cervical adenopathy.  Neurological: She displays normal reflexes. No cranial nerve deficit. She exhibits normal muscle tone. Coordination normal.  Skin: No rash noted. No erythema.  Psychiatric: She has a normal mood and affect. Her behavior is normal. Judgment and thought content normal.       Lab Results  Component Value Date   WBC 7.9 06/02/2013   HGB 13.0 06/02/2013   HCT 38.7 06/02/2013   PLT 256.0 06/02/2013   GLUCOSE 85 06/02/2013   GLUCOSE 90 06/02/2013   CHOL 169 05/20/2012   TRIG 75.0 05/20/2012   HDL 66.60 05/20/2012   LDLDIRECT 125.4 05/10/2011   LDLCALC 87 05/20/2012   ALT 9 06/02/2013   AST 19 06/02/2013   NA 140 06/02/2013   NA 140 06/02/2013   K 4.3 06/02/2013   K 4.0 06/02/2013   CL 102 06/02/2013   CL 104 06/02/2013   CREATININE 0.82 06/02/2013   CREATININE 0.7 06/02/2013   BUN 11 06/02/2013   BUN 11 06/02/2013   CO2 25 06/02/2013   CO2 30 06/02/2013   TSH 2.13 04/07/2013   HGBA1C 5.6 09/11/2008          Assessment & Plan:

## 2013-12-31 NOTE — Assessment & Plan Note (Signed)
Continue with current prescription therapy as reflected on the Med list.  

## 2013-12-31 NOTE — Progress Notes (Signed)
Pre visit review using our clinic review tool, if applicable. No additional management support is needed unless otherwise documented below in the visit note. 

## 2013-12-31 NOTE — Assessment & Plan Note (Signed)
Tramadol prn F/u w/Dr Wynelle Link

## 2014-01-05 ENCOUNTER — Other Ambulatory Visit: Payer: Self-pay

## 2014-01-05 MED ORDER — TRAMADOL HCL 50 MG PO TABS: ORAL_TABLET | ORAL | Status: DC

## 2014-01-28 ENCOUNTER — Other Ambulatory Visit: Payer: Self-pay | Admitting: Internal Medicine

## 2014-02-23 ENCOUNTER — Telehealth: Payer: Self-pay | Admitting: *Deleted

## 2014-02-23 MED ORDER — CLONAZEPAM 0.25 MG PO TBDP: 0.2500 mg | ORAL_TABLET | Freq: Two times a day (BID) | ORAL | Status: DC | PRN

## 2014-02-23 NOTE — Telephone Encounter (Signed)
Done

## 2014-02-23 NOTE — Telephone Encounter (Signed)
Rf req for Clonazepam 0.25 mg 1 po bid prn. # 60. Last filled 08/15/13. Ok to Rf in PCP absence?

## 2014-02-23 NOTE — Telephone Encounter (Signed)
verbal ok to phone in #60, no refills  Ok to refill #60, no other refill (per protocol covering for absent PCP) -

## 2014-02-24 ENCOUNTER — Telehealth: Payer: Self-pay | Admitting: *Deleted

## 2014-02-24 NOTE — Telephone Encounter (Signed)
Pharmacy is wanting your to return their call on rx that was called in for her clonazepam.../lmb

## 2014-02-24 NOTE — Telephone Encounter (Signed)
I called pharmacy-- they need to clarify if Clonazepam is ODT. I advised yes per EMR.

## 2014-02-25 ENCOUNTER — Other Ambulatory Visit: Payer: Self-pay

## 2014-02-25 ENCOUNTER — Other Ambulatory Visit: Payer: Self-pay | Admitting: Internal Medicine

## 2014-02-25 MED ORDER — CLONAZEPAM 0.25 MG PO TBDP: 0.2500 mg | ORAL_TABLET | Freq: Two times a day (BID) | ORAL | Status: DC | PRN

## 2014-04-06 ENCOUNTER — Other Ambulatory Visit (INDEPENDENT_AMBULATORY_CARE_PROVIDER_SITE_OTHER): Payer: Medicare Other

## 2014-04-06 ENCOUNTER — Other Ambulatory Visit: Payer: Self-pay | Admitting: *Deleted

## 2014-04-06 DIAGNOSIS — E785 Hyperlipidemia, unspecified: Secondary | ICD-10-CM | POA: Diagnosis not present

## 2014-04-06 LAB — COMPREHENSIVE METABOLIC PANEL
ALT: 11 U/L (ref 0–35)
AST: 17 U/L (ref 0–37)
Albumin: 3.4 g/dL — ABNORMAL LOW (ref 3.5–5.2)
Alkaline Phosphatase: 66 U/L (ref 39–117)
BUN: 15 mg/dL (ref 6–23)
CALCIUM: 9.1 mg/dL (ref 8.4–10.5)
CHLORIDE: 103 meq/L (ref 96–112)
CO2: 27 meq/L (ref 19–32)
Creatinine, Ser: 0.6 mg/dL (ref 0.4–1.2)
GFR: 95.7 mL/min (ref >60.00)
Glucose, Bld: 96 mg/dL (ref 70–99)
Potassium: 3.7 mEq/L (ref 3.5–5.1)
SODIUM: 137 meq/L (ref 135–145)
TOTAL PROTEIN: 6.8 g/dL (ref 6.0–8.3)
Total Bilirubin: 0.6 mg/dL (ref 0.2–1.2)

## 2014-04-07 ENCOUNTER — Ambulatory Visit (INDEPENDENT_AMBULATORY_CARE_PROVIDER_SITE_OTHER): Payer: Medicare Other | Admitting: Internal Medicine

## 2014-04-07 ENCOUNTER — Encounter: Payer: Self-pay | Admitting: Internal Medicine

## 2014-04-07 VITALS — BP 152/80 | HR 99 | Temp 98.0°F | Wt 119.0 lb

## 2014-04-07 DIAGNOSIS — E538 Deficiency of other specified B group vitamins: Secondary | ICD-10-CM | POA: Diagnosis not present

## 2014-04-07 DIAGNOSIS — E559 Vitamin D deficiency, unspecified: Secondary | ICD-10-CM | POA: Diagnosis not present

## 2014-04-07 DIAGNOSIS — N959 Unspecified menopausal and perimenopausal disorder: Secondary | ICD-10-CM | POA: Insufficient documentation

## 2014-04-07 DIAGNOSIS — M25552 Pain in left hip: Secondary | ICD-10-CM | POA: Diagnosis not present

## 2014-04-07 MED ORDER — PROGESTERONE MICRONIZED 200 MG PO CAPS: ORAL_CAPSULE | ORAL | Status: DC

## 2014-04-07 MED ORDER — ESTRADIOL 0.05 MG/24HR TD PTWK: 0.0500 mg | MEDICATED_PATCH | TRANSDERMAL | Status: DC

## 2014-04-07 NOTE — Assessment & Plan Note (Signed)
HRT risks were discussed Continue with current prescription therapy as reflected on the Med list per pt's wish

## 2014-04-07 NOTE — Progress Notes (Signed)
   Subjective:    HPI   F/u rapid HR and elevated BP - resolved. F/u  nausea all the time - much better. No HA. F/u  gas, bloating, early satiety - resolved. No BM since Wed when she had diarrhea...  The patient presents for a follow-up of  chronic hypertension, chronic dyslipidemia, GERD controlled with medicines.  F/u L groin/hip pain - she is getting injections now - they help - resolved.  F/u on HRT: HRT risks were discussed    Review of Systems  Constitutional: Negative for chills, activity change, appetite change, fatigue and unexpected weight change.  HENT: Negative for congestion, mouth sores and sinus pressure.   Eyes: Negative for visual disturbance.  Respiratory: Negative for cough and chest tightness.   Gastrointestinal: Negative for nausea and abdominal pain.  Genitourinary: Negative for frequency, difficulty urinating and vaginal pain.  Musculoskeletal: Negative for back pain and gait problem.  Skin: Negative for pallor and rash.  Neurological: Negative for dizziness, tremors, weakness, numbness and headaches.  Psychiatric/Behavioral: Negative for confusion and sleep disturbance.   Wt Readings from Last 3 Encounters:  04/07/14 119 lb (53.978 kg)  12/31/13 118 lb (53.524 kg)  06/04/13 120 lb (54.432 kg)   BP Readings from Last 3 Encounters:  04/07/14 152/80  12/31/13 124/70  06/04/13 128/72       Objective:   Physical Exam  Constitutional: She appears well-developed. No distress.  HENT:  Head: Normocephalic.  Right Ear: External ear normal.  Left Ear: External ear normal.  Nose: Nose normal.  Mouth/Throat: Oropharynx is clear and moist.  Eyes: Conjunctivae are normal. Pupils are equal, round, and reactive to light. Right eye exhibits no discharge. Left eye exhibits no discharge.  Neck: Normal range of motion. Neck supple. No JVD present. No tracheal deviation present. No thyromegaly present.  Cardiovascular: Normal rate, regular rhythm and normal heart  sounds.   Pulmonary/Chest: No stridor. No respiratory distress. She has no wheezes.  Abdominal: Soft. Bowel sounds are normal. She exhibits no distension and no mass. There is no tenderness. There is no rebound and no guarding.  Musculoskeletal: She exhibits no edema or tenderness.  Lymphadenopathy:    She has no cervical adenopathy.  Neurological: She displays normal reflexes. No cranial nerve deficit. She exhibits normal muscle tone. Coordination normal.  Skin: No rash noted. No erythema.  Psychiatric: She has a normal mood and affect. Her behavior is normal. Judgment and thought content normal.       Lab Results  Component Value Date   WBC 7.9 06/02/2013   HGB 13.0 06/02/2013   HCT 38.7 06/02/2013   PLT 256.0 06/02/2013   GLUCOSE 96 04/06/2014   CHOL 169 05/20/2012   TRIG 75.0 05/20/2012   HDL 66.60 05/20/2012   LDLDIRECT 125.4 05/10/2011   LDLCALC 87 05/20/2012   ALT 11 04/06/2014   AST 17 04/06/2014   NA 137 04/06/2014   K 3.7 04/06/2014   CL 103 04/06/2014   CREATININE 0.6 04/06/2014   BUN 15 04/06/2014   CO2 27 04/06/2014   TSH 2.13 04/07/2013   HGBA1C 5.6 09/11/2008          Assessment & Plan:

## 2014-04-07 NOTE — Assessment & Plan Note (Addendum)
Chronic  Continue with current prescription therapy as reflected on the Med list.  

## 2014-04-07 NOTE — Assessment & Plan Note (Signed)
Pt is contemplating THR - discussed... Continue with current prn prescription therapy as reflected on the Med list.   Potential benefits of a long term opioids use as well as potential risks (i.e. addiction risk, apnea etc) and complications (i.e. Somnolence, constipation and others) were explained to the patient and were aknowledged.

## 2014-04-07 NOTE — Assessment & Plan Note (Signed)
Continue with current prescription therapy as reflected on the Med list.  

## 2014-04-07 NOTE — Progress Notes (Signed)
Pre visit review using our clinic review tool, if applicable. No additional management support is needed unless otherwise documented below in the visit note. 

## 2014-06-04 ENCOUNTER — Other Ambulatory Visit (INDEPENDENT_AMBULATORY_CARE_PROVIDER_SITE_OTHER): Payer: Medicare Other

## 2014-06-04 ENCOUNTER — Encounter: Payer: Self-pay | Admitting: Internal Medicine

## 2014-06-04 ENCOUNTER — Ambulatory Visit (INDEPENDENT_AMBULATORY_CARE_PROVIDER_SITE_OTHER): Payer: Medicare Other | Admitting: Internal Medicine

## 2014-06-04 VITALS — BP 158/82 | HR 104 | Temp 98.0°F | Ht 65.0 in | Wt 111.0 lb

## 2014-06-04 DIAGNOSIS — R030 Elevated blood-pressure reading, without diagnosis of hypertension: Secondary | ICD-10-CM

## 2014-06-04 DIAGNOSIS — Z634 Disappearance and death of family member: Secondary | ICD-10-CM

## 2014-06-04 DIAGNOSIS — R000 Tachycardia, unspecified: Secondary | ICD-10-CM

## 2014-06-04 DIAGNOSIS — F4323 Adjustment disorder with mixed anxiety and depressed mood: Secondary | ICD-10-CM

## 2014-06-04 LAB — BASIC METABOLIC PANEL
BUN: 10 mg/dL (ref 6–23)
CO2: 28 mEq/L (ref 19–32)
Calcium: 9.5 mg/dL (ref 8.4–10.5)
Chloride: 102 mEq/L (ref 96–112)
Creatinine, Ser: 0.65 mg/dL (ref 0.40–1.20)
GFR: 90.59 mL/min (ref >60.00)
Glucose, Bld: 119 mg/dL — ABNORMAL HIGH (ref 70–99)
POTASSIUM: 3.4 meq/L — AB (ref 3.5–5.1)
Sodium: 139 mEq/L (ref 135–145)

## 2014-06-04 LAB — CBC WITH DIFFERENTIAL/PLATELET
Basophils Absolute: 0 10*3/uL (ref 0.0–0.1)
Basophils Relative: 0.2 % (ref 0.0–3.0)
EOS ABS: 0.1 10*3/uL (ref 0.0–0.7)
Eosinophils Relative: 1.7 % (ref 0.0–5.0)
HCT: 38.5 % (ref 36.0–46.0)
Hemoglobin: 12.8 g/dL (ref 12.0–15.0)
Lymphocytes Relative: 13.3 % (ref 12.0–46.0)
Lymphs Abs: 0.9 10*3/uL (ref 0.7–4.0)
MCHC: 33.1 g/dL (ref 30.0–36.0)
MCV: 86.5 fl (ref 78.0–100.0)
MONO ABS: 0.5 10*3/uL (ref 0.1–1.0)
Monocytes Relative: 7.9 % (ref 3.0–12.0)
NEUTROS PCT: 76.9 % (ref 43.0–77.0)
Neutro Abs: 5.3 10*3/uL (ref 1.4–7.7)
Platelets: 300 10*3/uL (ref 150.0–400.0)
RBC: 4.46 Mil/uL (ref 3.87–5.11)
RDW: 13.7 % (ref 11.5–15.5)
WBC: 6.9 10*3/uL (ref 4.0–10.5)

## 2014-06-04 LAB — MAGNESIUM: Magnesium: 1.8 mg/dL (ref 1.5–2.5)

## 2014-06-04 MED ORDER — METOPROLOL TARTRATE 25 MG PO TABS: 25.0000 mg | ORAL_TABLET | Freq: Two times a day (BID) | ORAL | Status: DC

## 2014-06-04 MED ORDER — SERTRALINE HCL 50 MG PO TABS: ORAL_TABLET | ORAL | Status: DC

## 2014-06-04 NOTE — Progress Notes (Signed)
Pre visit review using our clinic review tool, if applicable. No additional management support is needed unless otherwise documented below in the visit note. 

## 2014-06-04 NOTE — Progress Notes (Signed)
   Subjective:    Patient ID: Theresa Hampton, female    DOB: [birth year], 81 y.o.   MRN: [id number]  HPI  Her husband of 54 years had a massive heart attack 05/07/14; he lived slightly over a week. Since then she's not been sleeping well and describes being jittery. Appetite is poor. Sleep is fair. She's been using Benadryl as well as clonazepam.  Blood pressure at home was measured as 176/80. She's had some diffuse nonspecific headache which is really a chronic, recurrent issue.  She has no other cardiopulmonary symptoms.  Review of Systems   Chest pain, palpitations, tachycardia, exertional dyspnea, paroxysmal nocturnal dyspnea, claudication or edema are absent.      Objective:   Physical Exam  Pertinent or positive findings include: She appears dramatically younger than stated age Decreased auditory acuity. Thyroid is small. She has an irregular, irregular rapid rhythm. Deep tendon reflexes are 1/2+.  General appearance :adequately nourished; in no distress. Eyes: No conjunctival inflammation or scleral icterus is present. Oral exam: Dental hygiene is good. Lips and gums are healthy appearing.There is no oropharyngeal erythema or exudate noted.  Heart:   S1 and S2 normal without gallop, murmur, click, rub or other extra sounds   Lungs:Chest clear to auscultation; no wheezes, rhonchi,rales ,or rubs present.No increased work of breathing.  Abdomen: bowel sounds normal, soft and non-tender without masses, organomegaly or hernias noted.  No guarding or rebound.  Vascular : all pulses equal ; no bruits present. Skin:Warm & dry.  Intact without suspicious lesions or rashes ; no jaundice or tenting. No onycholysis Lymphatic: No lymphadenopathy is noted about the head, neck, axilla Neuro: Strength, tone normal. No tremor       Assessment & Plan:  1 tachyarrhythmia; PACs on EKG  #2 dramatic exogenous stress, death of her husband of over 7 decades #3 elevated BP  Plan: See  orders and recommendations

## 2014-06-04 NOTE — Patient Instructions (Addendum)
To prevent sleep dysfunction follow these instructions for sleep hygiene. Do not read, watch TV, or eat in bed. Do not get into bed until you are ready to turn off the light &  to go to sleep. Do not ingest stimulants ( decongestants, diet pills, nicotine, caffeine) after the evening meal.Do not take daytime naps.Cardiovascular exercise, this can be as simple a program as walking, is recommended 30-45 minutes 3-4 times per week. If you're not exercising you should take 6-8 weeks to build up to this level.  To prevent palpitations or premature beats, avoid stimulants such as decongestants, diet pills, nicotine, or caffeine (coffee, tea, cola, or chocolate) to excess.

## 2014-06-05 ENCOUNTER — Ambulatory Visit: Payer: Medicare Other | Admitting: Family

## 2014-06-05 ENCOUNTER — Other Ambulatory Visit: Payer: Self-pay | Admitting: Internal Medicine

## 2014-06-05 DIAGNOSIS — E876 Hypokalemia: Secondary | ICD-10-CM

## 2014-06-05 LAB — TSH: TSH: 2.72 u[IU]/mL (ref 0.35–4.50)

## 2014-06-05 LAB — T4, FREE: FREE T4: 1.01 ng/dL (ref 0.60–1.60)

## 2014-06-05 LAB — T3, FREE: T3, Free: 3.1 pg/mL (ref 2.3–4.2)

## 2014-06-06 ENCOUNTER — Emergency Department (HOSPITAL_COMMUNITY): Payer: Medicare Other

## 2014-06-06 ENCOUNTER — Emergency Department (HOSPITAL_COMMUNITY)
Admission: EM | Admit: 2014-06-06 | Discharge: 2014-06-06 | Disposition: A | Discharge status: Home / Self Care | Payer: Medicare Other | Attending: Emergency Medicine | Admitting: Emergency Medicine

## 2014-06-06 ENCOUNTER — Encounter (HOSPITAL_COMMUNITY): Payer: Self-pay | Admitting: Family Medicine

## 2014-06-06 DIAGNOSIS — Z7951 Long term (current) use of inhaled steroids: Secondary | ICD-10-CM | POA: Diagnosis not present

## 2014-06-06 DIAGNOSIS — Z7982 Long term (current) use of aspirin: Secondary | ICD-10-CM | POA: Diagnosis not present

## 2014-06-06 DIAGNOSIS — R002 Palpitations: Secondary | ICD-10-CM | POA: Insufficient documentation

## 2014-06-06 DIAGNOSIS — Z79899 Other long term (current) drug therapy: Secondary | ICD-10-CM | POA: Insufficient documentation

## 2014-06-06 DIAGNOSIS — Z8711 Personal history of peptic ulcer disease: Secondary | ICD-10-CM | POA: Insufficient documentation

## 2014-06-06 DIAGNOSIS — F419 Anxiety disorder, unspecified: Secondary | ICD-10-CM

## 2014-06-06 DIAGNOSIS — M1990 Unspecified osteoarthritis, unspecified site: Secondary | ICD-10-CM | POA: Diagnosis not present

## 2014-06-06 DIAGNOSIS — E559 Vitamin D deficiency, unspecified: Secondary | ICD-10-CM | POA: Insufficient documentation

## 2014-06-06 DIAGNOSIS — E785 Hyperlipidemia, unspecified: Secondary | ICD-10-CM | POA: Insufficient documentation

## 2014-06-06 DIAGNOSIS — Z862 Personal history of diseases of the blood and blood-forming organs and certain disorders involving the immune mechanism: Secondary | ICD-10-CM | POA: Diagnosis not present

## 2014-06-06 DIAGNOSIS — N189 Chronic kidney disease, unspecified: Secondary | ICD-10-CM | POA: Insufficient documentation

## 2014-06-06 DIAGNOSIS — R030 Elevated blood-pressure reading, without diagnosis of hypertension: Secondary | ICD-10-CM | POA: Diagnosis not present

## 2014-06-06 DIAGNOSIS — E538 Deficiency of other specified B group vitamins: Secondary | ICD-10-CM | POA: Insufficient documentation

## 2014-06-06 DIAGNOSIS — R079 Chest pain, unspecified: Secondary | ICD-10-CM | POA: Diagnosis not present

## 2014-06-06 LAB — BASIC METABOLIC PANEL
Anion gap: 5 (ref 5–15)
BUN: 9 mg/dL (ref 6–23)
CALCIUM: 9 mg/dL (ref 8.4–10.5)
CHLORIDE: 102 mmol/L (ref 96–112)
CO2: 31 mmol/L (ref 19–32)
Creatinine, Ser: 0.66 mg/dL (ref 0.50–1.10)
GFR calc Af Amer: 86 mL/min — ABNORMAL LOW (ref >90)
GFR, EST NON AFRICAN AMERICAN: 74 mL/min — AB (ref >90)
Glucose, Bld: 113 mg/dL — ABNORMAL HIGH (ref 70–99)
POTASSIUM: 3.3 mmol/L — AB (ref 3.5–5.1)
Sodium: 138 mmol/L (ref 135–145)

## 2014-06-06 LAB — CBC WITH DIFFERENTIAL/PLATELET
Basophils Absolute: 0 10*3/uL (ref 0.0–0.1)
Basophils Relative: 0 % (ref 0–1)
EOS PCT: 1 % (ref 0–5)
Eosinophils Absolute: 0 10*3/uL (ref 0.0–0.7)
HEMATOCRIT: 36.4 % (ref 36.0–46.0)
Hemoglobin: 12 g/dL (ref 12.0–15.0)
Lymphocytes Relative: 11 % — ABNORMAL LOW (ref 12–46)
Lymphs Abs: 0.7 10*3/uL (ref 0.7–4.0)
MCH: 28.6 pg (ref 26.0–34.0)
MCHC: 33 g/dL (ref 30.0–36.0)
MCV: 86.7 fL (ref 78.0–100.0)
MONOS PCT: 7 % (ref 3–12)
Monocytes Absolute: 0.5 10*3/uL (ref 0.1–1.0)
Neutro Abs: 5.7 10*3/uL (ref 1.7–7.7)
Neutrophils Relative %: 81 % — ABNORMAL HIGH (ref 43–77)
PLATELETS: 280 10*3/uL (ref 150–400)
RBC: 4.2 MIL/uL (ref 3.87–5.11)
RDW: 13.2 % (ref 11.5–15.5)
WBC: 7 10*3/uL (ref 4.0–10.5)

## 2014-06-06 LAB — TROPONIN I

## 2014-06-06 MED ORDER — LORAZEPAM 1 MG PO TABS
0.5000 mg | ORAL_TABLET | Freq: Once | ORAL | Status: AC
  Administered 2014-06-06: 0.5 mg via ORAL
  Filled 2014-06-06: qty 1

## 2014-06-06 MED ORDER — NITROGLYCERIN 2 % TD OINT
1.0000 [in_us] | TOPICAL_OINTMENT | Freq: Once | TRANSDERMAL | Status: AC
  Administered 2014-06-06: 1 [in_us] via TOPICAL
  Filled 2014-06-06: qty 1

## 2014-06-06 MED ORDER — ONDANSETRON HCL 4 MG/2ML IJ SOLN
4.0000 mg | Freq: Once | INTRAMUSCULAR | Status: AC
  Administered 2014-06-06: 4 mg via INTRAVENOUS
  Filled 2014-06-06: qty 2

## 2014-06-06 MED ORDER — POTASSIUM CHLORIDE CRYS ER 20 MEQ PO TBCR
20.0000 meq | EXTENDED_RELEASE_TABLET | Freq: Once | ORAL | Status: AC
  Administered 2014-06-06: 20 meq via ORAL
  Filled 2014-06-06: qty 1

## 2014-06-06 NOTE — Discharge Instructions (Signed)
Continue to take your Clonazepam (klonopin) and metoprolol as directed.  Check your blood pressure one to two times daily and write down the number for your family doctor.  Please get rechecked if you have chest pain, or new/worsening symptoms.  Your potassium was low today.  Please have this rechecked by your family doctor.     Palpitations A palpitation is the feeling that your heartbeat is irregular or is faster than normal. It may feel like your heart is fluttering or skipping a beat. Palpitations are usually not a serious problem. However, in some cases, you may need further medical evaluation. CAUSES  Palpitations can be caused by:  Smoking.  Caffeine or other stimulants, such as diet pills or energy drinks.  Alcohol.  Stress and anxiety.  Strenuous physical activity.  Fatigue.  Certain medicines.  Heart disease, especially if you have a history of irregular heart rhythms (arrhythmias), such as atrial fibrillation, atrial flutter, or supraventricular tachycardia.  An improperly working pacemaker or defibrillator. DIAGNOSIS  To find the cause of your palpitations, your health care provider will take your medical history and perform a physical exam. Your health care provider may also have you take a test called an ambulatory electrocardiogram (ECG). An ECG records your heartbeat patterns over a 24-hour period. You may also have other tests, such as:  Transthoracic echocardiogram (TTE). During echocardiography, sound waves are used to evaluate how blood flows through your heart.  Transesophageal echocardiogram (TEE).  Cardiac monitoring. This allows your health care provider to monitor your heart rate and rhythm in real time.  Holter monitor. This is a portable device that records your heartbeat and can help diagnose heart arrhythmias. It allows your health care provider to track your heart activity for several days, if needed.  Stress tests by exercise or by giving medicine  that makes the heart beat faster. TREATMENT  Treatment of palpitations depends on the cause of your symptoms and can vary greatly. Most cases of palpitations do not require any treatment other than time, relaxation, and monitoring your symptoms. Other causes, such as atrial fibrillation, atrial flutter, or supraventricular tachycardia, usually require further treatment. HOME CARE INSTRUCTIONS   Avoid:  Caffeinated coffee, tea, soft drinks, diet pills, and energy drinks.  Chocolate.  Alcohol.  Stop smoking if you smoke.  Reduce your stress and anxiety. Things that can help you relax include:  A method of controlling things in your body, such as your heartbeats, with your mind (biofeedback).  Yoga.  Meditation.  Physical activity such as swimming, jogging, or walking.  Get plenty of rest and sleep. SEEK MEDICAL CARE IF:   You continue to have a fast or irregular heartbeat beyond 24 hours.  Your palpitations occur more often. SEEK IMMEDIATE MEDICAL CARE IF:  You have chest pain or shortness of breath.  You have a severe headache.  You feel dizzy or you faint. MAKE SURE YOU:  Understand these instructions.  Will watch your condition.  Will get help right away if you are not doing well or get worse. Document Released: 04/21/2000 Document Revised: 04/29/2013 Document Reviewed: 06/23/2011 [facility] Patient Information 2015 [place], [place]. This information is not intended to replace advice given to you by your health care provider. Make sure you discuss any questions you have with your health care provider.

## 2014-06-06 NOTE — ED Provider Notes (Signed)
CSN: [id number]     Arrival date & time 06/06/14  4098 History   First MD Initiated Contact with Patient 06/06/14 [phone]     Chief Complaint  Patient presents with  . Chest Pain     Patient is a 81 y.o. female presenting with chest pain. The history is provided by the patient. No language interpreter was used.  Chest Pain  Ms. Murray presents for evaluation of palpitations and chest discomfort. Her symptoms started about 3 weeks ago when her husband passed away. She reports feeling jittery and anxious and that her heart is beating irregularly. Her symptoms are moderate and waxing and waning. She denies any chest pain but feels uncomfortable from the palpitations. She saw her primary care physician and was started on Klonopin, which helped her symptoms significantly. When she went back she was told to stop the Klonopin and was started on Zoloft. She was also started on metoprolol for high blood pressure. She states her blood pressure has been running higher since he passed away, up to 478G and 956O systolic. She denies any fevers, cough, shortness of breath, diaphoresis, nausea, vomiting, leg swelling. She has no previous history of high blood pressure or diabetes.  Past Medical History  Diagnosis Date  . Anemia     iron deficiency  . Hyperlipidemia   . Osteoarthritis     Dr. Maureen Ralphs  . Peptic ulcer disease 2003  . Vitamin D deficiency   . Allergic rhinitis   . Chronic sinusitis   . Vitamin B12 deficiency   . Chronic kidney disease     Pyelo 2014   History reviewed. No pertinent past surgical history. Family History  Problem Relation Age of Onset  . Hypertension    . Arthritis Mother    History  Substance Use Topics  . Smoking status: Never Smoker   . Smokeless tobacco: Not on file  . Alcohol Use: No   OB History    No data available     Review of Systems  Cardiovascular: Positive for chest pain.  All other systems reviewed and are negative.     Allergies  Lidocaine  hcl  Home Medications   Prior to Admission medications   Medication Sig Start Date End Date Taking? Authorizing Provider  aspirin 325 MG EC tablet Take 325 mg by mouth daily. Take 1/2 daily     Historical Provider, MD  celecoxib (CELEBREX) 200 MG capsule Take 1 capsule (200 mg total) by mouth daily. 06/04/13   Aleksei Plotnikov V, MD  Cholecalciferol (VITAMIN D3) 1000 UNITS tablet Take 2,000 Units by mouth daily.      Historical Provider, MD  ciprofloxacin (CIPRO) 500 MG tablet Take 1 tablet (500 mg total) by mouth 2 (two) times daily. 04/07/13   Aleksei Plotnikov V, MD  clobetasol cream (TEMOVATE) 1.30 % Apply 1 application topically 2 (two) times daily. Use sparingly. 12/22/13   Aleksei Plotnikov V, MD  clonazePAM (KLONOPIN) 0.25 MG disintegrating tablet Take 1 tablet (0.25 mg total) by mouth 2 (two) times daily as needed (anxiety). 02/25/14   Aleksei Plotnikov V, MD  esomeprazole (NEXIUM 24HR) 20 MG capsule Take 1 capsule (20 mg total) by mouth 2 (two) times daily before a meal. 04/07/13   Aleksei Plotnikov V, MD  estradiol (CLIMARA - DOSED IN MG/24 HR) 0.05 mg/24hr patch Place 1 patch (0.05 mg total) onto the skin once a week. 04/07/14   Aleksei Plotnikov V, MD  fluconazole (DIFLUCAN) 150 MG tablet Take 1 tablet (150 mg  total) by mouth once. 04/30/13   Aleksei Plotnikov V, MD  fluticasone (FLONASE) 50 MCG/ACT nasal spray Place 1 spray into the nose daily. 08/21/12   Aleksei Plotnikov V, MD  gabapentin (NEURONTIN) 100 MG capsule TAKE 1 OR 2 CAPSULES BY MOUTH AT BEDTIME AS NEEDED FOR NEUROPATHY/BURNING LEGS 12/31/13   Aleksei Plotnikov V, MD  lansoprazole (PREVACID) 30 MG capsule Take 30 mg by mouth daily.      Historical Provider, MD  loratadine (CLARITIN) 10 MG tablet Take 10 mg by mouth daily.      Historical Provider, MD  metoprolol tartrate (LOPRESSOR) 25 MG tablet Take 1 tablet (25 mg total) by mouth 2 (two) times daily. 06/04/14   Hendricks Limes, MD  ondansetron (ZOFRAN) 4 MG tablet TAKE 1  TABLET BY MOUTH EVERY 8 HOURS AS NEEDED 04/15/13   Aleksei Plotnikov V, MD  progesterone (PROMETRIUM) 200 MG capsule TAKE 1 CAPSULE BY MOUTH AT BEDTIME ON DAYS 1-12 OF EVERY 3RD MONTH 04/07/14   Aleksei Plotnikov V, MD  promethazine (PHENERGAN) 12.5 MG tablet Take 6.25 mg by mouth every 8 (eight) hours as needed. 08/03/11 05/24/12  Elige Radon, MD  sertraline (ZOLOFT) 50 MG tablet 1/2 daily 06/04/14   Hendricks Limes, MD  simvastatin (ZOCOR) 40 MG tablet Take 40 mg by mouth daily.      Historical Provider, MD  traMADol (ULTRAM) 50 MG tablet TAKE 1 TO 2 TABLETS BY MOUTH TWICE A DAY AS NEEDED FOR PAIN 01/05/14   Cassandria Anger, MD  vitamin B-12 (CYANOCOBALAMIN) 500 MCG tablet Take 500 mcg by mouth daily.      Historical Provider, MD   BP 183/78 mmHg  Pulse 87  Temp(Src) 97.8 F (36.6 C)  Resp 18  SpO2 99% Physical Exam  Constitutional: She is oriented to person, place, and time. She appears well-developed and well-nourished.  HENT:  Head: Normocephalic and atraumatic.  Cardiovascular: Normal rate and regular rhythm.   Faint SEM at LSB  Pulmonary/Chest: Effort normal and breath sounds normal. No respiratory distress.  Abdominal: Soft. There is no tenderness. There is no rebound and no guarding.  Musculoskeletal: She exhibits no edema or tenderness.  Neurological: She is alert and oriented to person, place, and time.  Skin: Skin is warm and dry.  Psychiatric: Her behavior is normal.  anxious  Nursing note and vitals reviewed.   ED Course  Procedures (including critical care time) Labs Review Labs Reviewed  BASIC METABOLIC PANEL - Abnormal; Notable for the following:    Potassium 3.3 (*)    Glucose, Bld 113 (*)    GFR calc non Af Amer 74 (*)    GFR calc Af Amer 86 (*)    All other components within normal limits  CBC WITH DIFFERENTIAL/PLATELET - Abnormal; Notable for the following:    Neutrophils Relative % 81 (*)    Lymphocytes Relative 11 (*)    All other components within  normal limits  TROPONIN I    Imaging Review Dg Chest 2 View  06/06/2014   CLINICAL DATA:  Chest pain.  EXAM: CHEST  2 VIEW  COMPARISON:  November 07, 2007.  FINDINGS: The heart size and mediastinal contours are within normal limits. Both lungs are clear. No pneumothorax or pleural effusion is noted. The visualized skeletal structures are unremarkable.  IMPRESSION: No acute cardiopulmonary abnormality seen.   Electronically Signed   By: Sabino Dick M.D.   On: 06/06/2014 07:42     EKG Interpretation   Date/Time:  Saturday June 06 2014 06:44:43 EST Ventricular Rate:  86 PR Interval:  143 QRS Duration: 89 QT Interval:  384 QTC Calculation: 459 R Axis:   -52 Text Interpretation:  Sinus rhythm Supraventricular bigeminy Left anterior  fascicular block Abnormal R-wave progression, early transition When  compared with ECG of 01/20/2003, Premature atrial complexes are now Present  Confirmed by [facility]  MD, DAVID ([id number]) on 06/06/2014 6:48:13 AM      MDM   Final diagnoses:  Palpitations  Anxiety     Patient here for palpitations after losing her husband to MI 3 weeks ago. Patient reports that the Klonopin she was previously taking helped her palpitations but she was told to stop this and was started on Zoloft 2 days ago. She reports that her blood pressures have been high at home from 170s to 180s. On review of her office visits in the computer her blood pressure has been running 150s to 170s for at least a year. Clinical picture is not consistent with hypertensive urgency or ACS. Repeat EKG from her visit to PCP a few days ago and she had PACs on that EKG. She has no ectopy in the emergency department currently. Discussed with patient restarting the Klonopin and continuing her blood pressure medications. Clinical picture is not consistent with PE or CHF. Discussed PCP follow-up as well as return precautions.   Quintella Reichert, MD 06/06/14 [phone]

## 2014-06-06 NOTE — ED Notes (Signed)
Per EMS, pt woke up this am feeling jittery and having chest pain. sts recent episode of the same and husband died 3 weeks ago. Denies chest pain currently. Pt slight nausea.

## 2014-06-08 ENCOUNTER — Telehealth: Payer: Self-pay

## 2014-06-08 NOTE — Telephone Encounter (Signed)
-----   Message from Hendricks Limes, MD sent at 06/05/2014  2:54 PM EST ----- Please add A1c (R73.9)

## 2014-06-08 NOTE — Telephone Encounter (Signed)
Request for add on has been faxed to lab 

## 2014-06-08 NOTE — Telephone Encounter (Signed)
Could not add diabetes screening test; it can be checked @ next OV with her PCP

## 2014-06-08 NOTE — Telephone Encounter (Signed)
Phone call from [facility] with [place] lab. A1c can not be added on as the tube used to collect patient's blood can not be found. Patient will need to return to have lab draw her blood again if a1c is to be added.

## 2014-06-09 ENCOUNTER — Ambulatory Visit (INDEPENDENT_AMBULATORY_CARE_PROVIDER_SITE_OTHER): Payer: Medicare Other | Admitting: Internal Medicine

## 2014-06-09 ENCOUNTER — Encounter: Payer: Self-pay | Admitting: Internal Medicine

## 2014-06-09 VITALS — BP 164/82 | HR 50 | Temp 97.4°F | Wt 110.0 lb

## 2014-06-09 DIAGNOSIS — F4321 Adjustment disorder with depressed mood: Secondary | ICD-10-CM

## 2014-06-09 DIAGNOSIS — I10 Essential (primary) hypertension: Secondary | ICD-10-CM | POA: Diagnosis not present

## 2014-06-09 DIAGNOSIS — R000 Tachycardia, unspecified: Secondary | ICD-10-CM

## 2014-06-09 DIAGNOSIS — E538 Deficiency of other specified B group vitamins: Secondary | ICD-10-CM | POA: Diagnosis not present

## 2014-06-09 DIAGNOSIS — M25552 Pain in left hip: Secondary | ICD-10-CM

## 2014-06-09 MED ORDER — HYDROCODONE-ACETAMINOPHEN 5-325 MG PO TABS: 1.0000 | ORAL_TABLET | Freq: Two times a day (BID) | ORAL | Status: DC | PRN

## 2014-06-09 MED ORDER — MIRTAZAPINE 15 MG PO TABS
ORAL_TABLET | ORAL | Status: DC
Start: 2014-06-09 — End: 2015-01-08

## 2014-06-09 NOTE — Assessment & Plan Note (Signed)
Continue with current prescription therapy as reflected on the Med list.  

## 2014-06-09 NOTE — Progress Notes (Signed)
Subjective:    HPI   F/u ER visit on:  "Patient here for palpitations after losing her husband to MI 3 weeks ago. Patient reports that the Klonopin she was previously taking helped her palpitations but she was told to stop this and was started on Zoloft 2 days ago. She reports that her blood pressures have been high at home from 170s to 180s. On review of her office visits in the computer her blood pressure has been running 150s to 170s for at least a year. Clinical picture is not consistent with hypertensive urgency or ACS. Repeat EKG from her visit to PCP a few days ago and she had PACs on that EKG. She has no ectopy in the emergency department currently. Discussed with patient restarting the Klonopin and continuing her blood pressure medications. Clinical picture is not consistent with PE or CHF. Discussed PCP follow-up as well as return precautions.   Quintella Reichert, MD 06/06/14 1033"  F/u rapid HR and elevated BP - resolved. F/u  nausea all the time - much better. No HA. F/u  gas, bloating, early satiety - resolved. No BM since Wed when she had diarrhea...  The patient presents for a follow-up of  chronic hypertension, chronic dyslipidemia, GERD controlled with medicines.  F/u L groin/hip pain - she is getting injections now - they help - resolved.  F/u on HRT: HRT risks were discussed  BP 164/82 mmHg  Pulse 50  Temp(Src) 97.4 F (36.3 C) (Oral)  Wt 110 lb (49.896 kg)  SpO2 94%   Review of Systems  Constitutional: Negative for chills, activity change, appetite change, fatigue and unexpected weight change.  HENT: Negative for congestion, mouth sores and sinus pressure.   Eyes: Negative for visual disturbance.  Respiratory: Negative for cough and chest tightness.   Gastrointestinal: Negative for nausea and abdominal pain.  Genitourinary: Negative for frequency, difficulty urinating and vaginal pain.  Musculoskeletal: Negative for back pain and gait problem.  Skin:  Negative for pallor and rash.  Neurological: Negative for dizziness, tremors, weakness, numbness and headaches.  Psychiatric/Behavioral: Negative for suicidal ideas, confusion and sleep disturbance. The patient is nervous/anxious.    Wt Readings from Last 3 Encounters:  06/09/14 110 lb (49.896 kg)  06/04/14 111 lb (50.349 kg)  04/07/14 119 lb (53.978 kg)   BP Readings from Last 3 Encounters:  06/09/14 164/82  06/06/14 133/61  06/04/14 158/82       Objective:   Physical Exam  Constitutional: She appears well-developed. No distress.  HENT:  Head: Normocephalic.  Right Ear: External ear normal.  Left Ear: External ear normal.  Nose: Nose normal.  Mouth/Throat: Oropharynx is clear and moist.  Eyes: Conjunctivae are normal. Pupils are equal, round, and reactive to light. Right eye exhibits no discharge. Left eye exhibits no discharge.  Neck: Normal range of motion. Neck supple. No JVD present. No tracheal deviation present. No thyromegaly present.  Cardiovascular: Normal rate, regular rhythm and normal heart sounds.   Pulmonary/Chest: No stridor. No respiratory distress. She has no wheezes.  Abdominal: Soft. Bowel sounds are normal. She exhibits no distension and no mass. There is no tenderness. There is no rebound and no guarding.  Musculoskeletal: She exhibits no edema or tenderness.  Lymphadenopathy:    She has no cervical adenopathy.  Neurological: She displays normal reflexes. No cranial nerve deficit. She exhibits normal muscle tone. Coordination normal.  Skin: No rash noted. No erythema.  Psychiatric: Her behavior is normal. Judgment and thought content normal.  Depressed, tearful     Lab Results  Component Value Date   WBC 7.0 06/06/2014   HGB 12.0 06/06/2014   HCT 36.4 06/06/2014   PLT 280 06/06/2014   GLUCOSE 113* 06/06/2014   CHOL 169 05/20/2012   TRIG 75.0 05/20/2012   HDL 66.60 05/20/2012   LDLDIRECT 125.4 05/10/2011   LDLCALC 87 05/20/2012   ALT 11  04/06/2014   AST 17 04/06/2014   NA 138 06/06/2014   K 3.3* 06/06/2014   CL 102 06/06/2014   CREATININE 0.66 06/06/2014   BUN 9 06/06/2014   CO2 31 06/06/2014   TSH 2.72 06/04/2014   HGBA1C 5.6 09/11/2008          Assessment & Plan:

## 2014-06-09 NOTE — Patient Instructions (Signed)
Stop Sertaline

## 2014-06-09 NOTE — Assessment & Plan Note (Signed)
Norco 5/325 bid prn  Potential benefits of a long term opioids use as well as potential risks (i.e. addiction risk, apnea etc) and complications (i.e. Somnolence, constipation and others) were explained to the patient and were aknowledged.

## 2014-06-09 NOTE — Assessment & Plan Note (Signed)
Cont w/a beta-blocker Treat anxiety prn

## 2014-06-09 NOTE — Assessment & Plan Note (Signed)
Discussed. She is very depressed, anxious. Start Remeron, d/c Zoloft Clonazepam prn anxiety

## 2014-06-09 NOTE — Progress Notes (Signed)
Pre visit review using our clinic review tool, if applicable. No additional management support is needed unless otherwise documented below in the visit note. 

## 2014-06-22 ENCOUNTER — Ambulatory Visit: Payer: Medicare Other | Admitting: Internal Medicine

## 2014-07-10 ENCOUNTER — Ambulatory Visit (INDEPENDENT_AMBULATORY_CARE_PROVIDER_SITE_OTHER): Payer: Medicare Other | Admitting: Internal Medicine

## 2014-07-10 ENCOUNTER — Encounter: Payer: Self-pay | Admitting: Internal Medicine

## 2014-07-10 VITALS — BP 160/80 | HR 99 | Temp 98.5°F | Wt 110.0 lb

## 2014-07-10 DIAGNOSIS — M150 Primary generalized (osteo)arthritis: Secondary | ICD-10-CM | POA: Diagnosis not present

## 2014-07-10 DIAGNOSIS — I10 Essential (primary) hypertension: Secondary | ICD-10-CM | POA: Diagnosis not present

## 2014-07-10 DIAGNOSIS — F4321 Adjustment disorder with depressed mood: Secondary | ICD-10-CM | POA: Diagnosis not present

## 2014-07-10 DIAGNOSIS — E538 Deficiency of other specified B group vitamins: Secondary | ICD-10-CM

## 2014-07-10 DIAGNOSIS — M159 Polyosteoarthritis, unspecified: Secondary | ICD-10-CM

## 2014-07-10 NOTE — Progress Notes (Signed)
Pre visit review using our clinic review tool, if applicable. No additional management support is needed unless otherwise documented below in the visit note. 

## 2014-07-10 NOTE — Assessment & Plan Note (Signed)
Cont B12 Rx

## 2014-07-10 NOTE — Progress Notes (Signed)
   Subjective:    HPI     F/u rapid HR and elevated BP - resolved. F/u  nausea all the time - much better. No HA. F/u  gas, bloating, early satiety - resolved. No BM since Wed when she had diarrhea...  The patient presents for a follow-up of  chronic hypertension, chronic dyslipidemia, GERD controlled with medicines. Dr Wynelle Link writes Norco 7.5/325 Rx  F/u L groin/hip pain - she is getting injections now - they help - resolved.  F/u on HRT: HRT risks were discussed  BP 160/80 mmHg  Pulse 99  Temp(Src) 98.5 F (36.9 C) (Oral)  Wt 110 lb (49.896 kg)  SpO2 94%   Review of Systems  Constitutional: Negative for chills, activity change, appetite change, fatigue and unexpected weight change.  HENT: Negative for congestion, mouth sores and sinus pressure.   Eyes: Negative for visual disturbance.  Respiratory: Negative for cough and chest tightness.   Gastrointestinal: Negative for nausea and abdominal pain.  Genitourinary: Negative for frequency, difficulty urinating and vaginal pain.  Musculoskeletal: Negative for back pain and gait problem.  Skin: Negative for pallor and rash.  Neurological: Negative for dizziness, tremors, weakness, numbness and headaches.  Psychiatric/Behavioral: Negative for suicidal ideas, confusion and sleep disturbance. The patient is nervous/anxious.    Wt Readings from Last 3 Encounters:  07/10/14 110 lb (49.896 kg)  06/09/14 110 lb (49.896 kg)  06/04/14 111 lb (50.349 kg)   BP Readings from Last 3 Encounters:  07/10/14 160/80  06/09/14 164/82  06/06/14 133/61       Objective:   Physical Exam  Constitutional: She appears well-developed. No distress.  HENT:  Head: Normocephalic.  Right Ear: External ear normal.  Left Ear: External ear normal.  Nose: Nose normal.  Mouth/Throat: Oropharynx is clear and moist.  Eyes: Conjunctivae are normal. Pupils are equal, round, and reactive to light. Right eye exhibits no discharge. Left eye exhibits no  discharge.  Neck: Normal range of motion. Neck supple. No JVD present. No tracheal deviation present. No thyromegaly present.  Cardiovascular: Normal rate, regular rhythm and normal heart sounds.   Pulmonary/Chest: No stridor. No respiratory distress. She has no wheezes.  Abdominal: Soft. Bowel sounds are normal. She exhibits no distension and no mass. There is no tenderness. There is no rebound and no guarding.  Musculoskeletal: She exhibits no edema or tenderness.  Lymphadenopathy:    She has no cervical adenopathy.  Neurological: She displays normal reflexes. No cranial nerve deficit. She exhibits normal muscle tone. Coordination normal.  Skin: No rash noted. No erythema.  Psychiatric: Her behavior is normal. Judgment and thought content normal.  Depressed, tearful     Lab Results  Component Value Date   WBC 7.0 06/06/2014   HGB 12.0 06/06/2014   HCT 36.4 06/06/2014   PLT 280 06/06/2014   GLUCOSE 113* 06/06/2014   CHOL 169 05/20/2012   TRIG 75.0 05/20/2012   HDL 66.60 05/20/2012   LDLDIRECT 125.4 05/10/2011   LDLCALC 87 05/20/2012   ALT 11 04/06/2014   AST 17 04/06/2014   NA 138 06/06/2014   K 3.3* 06/06/2014   CL 102 06/06/2014   CREATININE 0.66 06/06/2014   BUN 9 06/06/2014   CO2 31 06/06/2014   TSH 2.72 06/04/2014   HGBA1C 5.6 09/11/2008          Assessment & Plan:  Patient ID: Theresa Hampton, female   DOB: 1919/09/18, 81 y.o.   MRN: [id number]

## 2014-07-10 NOTE — Assessment & Plan Note (Signed)
Cont w/Lopressor 

## 2014-07-10 NOTE — Assessment & Plan Note (Signed)
Coping a little better Discussed w/pt and Lelon Frohlich

## 2014-07-10 NOTE — Assessment & Plan Note (Signed)
OA  On Celebrex. Potential benefits of a long term NSAID use as well as potential risks  and complications were explained to the patient and were aknowledged.  On Norco per Dr Wynelle Link  Potential benefits of a long term opioids use as well as potential risks (i.e. addiction risk, apnea etc) and complications (i.e. Somnolence, constipation and others) were explained to the patient and were aknowledged.

## 2014-07-13 ENCOUNTER — Other Ambulatory Visit: Payer: Self-pay | Admitting: Internal Medicine

## 2014-08-12 DIAGNOSIS — F4320 Adjustment disorder, unspecified: Secondary | ICD-10-CM

## 2014-09-17 ENCOUNTER — Other Ambulatory Visit: Payer: Self-pay | Admitting: Internal Medicine

## 2014-09-28 DIAGNOSIS — M1612 Unilateral primary osteoarthritis, left hip: Secondary | ICD-10-CM | POA: Diagnosis not present

## 2014-10-08 ENCOUNTER — Telehealth: Payer: Self-pay | Admitting: Internal Medicine

## 2014-10-08 NOTE — Telephone Encounter (Signed)
Requesting refill for clonazePAM (KLONOPIN) 0.25 MG disintegrating tablet [[id number]]

## 2014-10-09 MED ORDER — CLONAZEPAM 0.25 MG PO TBDP: 0.2500 mg | ORAL_TABLET | Freq: Two times a day (BID) | ORAL | Status: DC | PRN

## 2014-10-09 NOTE — Telephone Encounter (Signed)
OK to fill this prescription with additional refills x5 Thank you!  

## 2014-10-09 NOTE — Telephone Encounter (Signed)
Done

## 2014-10-12 ENCOUNTER — Telehealth: Payer: Self-pay

## 2014-10-12 NOTE — Telephone Encounter (Signed)
Error

## 2014-10-14 ENCOUNTER — Ambulatory Visit (INDEPENDENT_AMBULATORY_CARE_PROVIDER_SITE_OTHER): Payer: Medicare Other | Admitting: Internal Medicine

## 2014-10-14 ENCOUNTER — Encounter: Payer: Self-pay | Admitting: Internal Medicine

## 2014-10-14 ENCOUNTER — Other Ambulatory Visit (INDEPENDENT_AMBULATORY_CARE_PROVIDER_SITE_OTHER): Payer: Medicare Other

## 2014-10-14 VITALS — BP 118/88 | HR 91 | Wt 111.0 lb

## 2014-10-14 DIAGNOSIS — E538 Deficiency of other specified B group vitamins: Secondary | ICD-10-CM

## 2014-10-14 DIAGNOSIS — E559 Vitamin D deficiency, unspecified: Secondary | ICD-10-CM

## 2014-10-14 DIAGNOSIS — I10 Essential (primary) hypertension: Secondary | ICD-10-CM | POA: Diagnosis not present

## 2014-10-14 DIAGNOSIS — M25552 Pain in left hip: Secondary | ICD-10-CM | POA: Diagnosis not present

## 2014-10-14 LAB — CBC WITH DIFFERENTIAL/PLATELET
BASOS ABS: 0 10*3/uL (ref 0.0–0.1)
BASOS PCT: 0.4 % (ref 0.0–3.0)
Eosinophils Absolute: 0.3 10*3/uL (ref 0.0–0.7)
Eosinophils Relative: 3.7 % (ref 0.0–5.0)
HCT: 36.8 % (ref 36.0–46.0)
HEMOGLOBIN: 12.1 g/dL (ref 12.0–15.0)
LYMPHS ABS: 1.8 10*3/uL (ref 0.7–4.0)
LYMPHS PCT: 21.7 % (ref 12.0–46.0)
MCHC: 33.1 g/dL (ref 30.0–36.0)
MCV: 86.4 fl (ref 78.0–100.0)
MONOS PCT: 7.9 % (ref 3.0–12.0)
Monocytes Absolute: 0.7 10*3/uL (ref 0.1–1.0)
Neutro Abs: 5.5 10*3/uL (ref 1.4–7.7)
Neutrophils Relative %: 66.3 % (ref 43.0–77.0)
Platelets: 279 10*3/uL (ref 150.0–400.0)
RBC: 4.25 Mil/uL (ref 3.87–5.11)
RDW: 13.2 % (ref 11.5–15.5)
WBC: 8.3 10*3/uL (ref 4.0–10.5)

## 2014-10-14 LAB — BASIC METABOLIC PANEL
BUN: 16 mg/dL (ref 6–23)
CHLORIDE: 104 meq/L (ref 96–112)
CO2: 28 mEq/L (ref 19–32)
Calcium: 9.5 mg/dL (ref 8.4–10.5)
Creatinine, Ser: 0.72 mg/dL (ref 0.40–1.20)
GFR: 80.44 mL/min (ref >60.00)
GLUCOSE: 97 mg/dL (ref 70–99)
Potassium: 3.8 mEq/L (ref 3.5–5.1)
Sodium: 137 mEq/L (ref 135–145)

## 2014-10-14 LAB — VITAMIN D 25 HYDROXY (VIT D DEFICIENCY, FRACTURES): VITD: 30.38 ng/mL (ref 30.00–100.00)

## 2014-10-14 MED ORDER — GABAPENTIN 100 MG PO CAPS: 100.0000 mg | ORAL_CAPSULE | Freq: Every day | ORAL | Status: DC

## 2014-10-14 NOTE — Assessment & Plan Note (Signed)
On Vit D 

## 2014-10-14 NOTE — Assessment & Plan Note (Signed)
6/16 Pt is contemplating L THR - high risk due to age

## 2014-10-14 NOTE — Assessment & Plan Note (Signed)
On B12 

## 2014-10-14 NOTE — Progress Notes (Signed)
Pre visit review using our clinic review tool, if applicable. No additional management support is needed unless otherwise documented below in the visit note. 

## 2014-10-14 NOTE — Progress Notes (Signed)
   Subjective:    HPI     F/u rapid HR and elevated BP - resolved. F/u  nausea all the time - much better. No HA. F/u  gas, bloating, early satiety - resolved. No BM since Wed when she had diarrhea...  The patient presents for a follow-up of  chronic hypertension, chronic dyslipidemia, GERD controlled with medicines. Dr Wynelle Link writes Norco 7.5/325 Rx  F/u L groin/hip pain - she is getting injections now - they help - resolved. C/o a lot of hip pain.  F/u on HRT: HRT risks were discussed  BP 118/88 mmHg  Pulse 91  Wt 111 lb (50.349 kg)  SpO2 93%   Review of Systems  Constitutional: Negative for chills, activity change, appetite change, fatigue and unexpected weight change.  HENT: Negative for congestion, mouth sores and sinus pressure.   Eyes: Negative for visual disturbance.  Respiratory: Negative for cough and chest tightness.   Gastrointestinal: Negative for nausea and abdominal pain.  Genitourinary: Negative for frequency, difficulty urinating and vaginal pain.  Musculoskeletal: Negative for back pain and gait problem.  Skin: Negative for pallor and rash.  Neurological: Negative for dizziness, tremors, weakness, numbness and headaches.  Psychiatric/Behavioral: Negative for suicidal ideas, confusion and sleep disturbance. The patient is nervous/anxious.    Wt Readings from Last 3 Encounters:  10/14/14 111 lb (50.349 kg)  07/10/14 110 lb (49.896 kg)  06/09/14 110 lb (49.896 kg)   BP Readings from Last 3 Encounters:  10/14/14 118/88  07/10/14 160/80  06/09/14 164/82       Objective:   Physical Exam  Constitutional: She appears well-developed. No distress.  HENT:  Head: Normocephalic.  Right Ear: External ear normal.  Left Ear: External ear normal.  Nose: Nose normal.  Mouth/Throat: Oropharynx is clear and moist.  Eyes: Conjunctivae are normal. Pupils are equal, round, and reactive to light. Right eye exhibits no discharge. Left eye exhibits no discharge.   Neck: Normal range of motion. Neck supple. No JVD present. No tracheal deviation present. No thyromegaly present.  Cardiovascular: Normal rate, regular rhythm and normal heart sounds.   Pulmonary/Chest: No stridor. No respiratory distress. She has no wheezes.  Abdominal: Soft. Bowel sounds are normal. She exhibits no distension and no mass. There is no tenderness. There is no rebound and no guarding.  Musculoskeletal: She exhibits no edema or tenderness.  Lymphadenopathy:    She has no cervical adenopathy.  Neurological: She displays normal reflexes. No cranial nerve deficit. She exhibits normal muscle tone. Coordination normal.  Skin: No rash noted. No erythema.  Psychiatric: Her behavior is normal. Judgment and thought content normal.  Less depressed, not tearful     Lab Results  Component Value Date   WBC 7.0 06/06/2014   HGB 12.0 06/06/2014   HCT 36.4 06/06/2014   PLT 280 06/06/2014   GLUCOSE 113* 06/06/2014   CHOL 169 05/20/2012   TRIG 75.0 05/20/2012   HDL 66.60 05/20/2012   LDLDIRECT 125.4 05/10/2011   LDLCALC 87 05/20/2012   ALT 11 04/06/2014   AST 17 04/06/2014   NA 138 06/06/2014   K 3.3* 06/06/2014   CL 102 06/06/2014   CREATININE 0.66 06/06/2014   BUN 9 06/06/2014   CO2 31 06/06/2014   TSH 2.72 06/04/2014   HGBA1C 5.6 09/11/2008          Assessment & Plan:

## 2014-10-14 NOTE — Assessment & Plan Note (Signed)
On Lopressor 

## 2014-10-22 ENCOUNTER — Telehealth: Payer: Self-pay | Admitting: Internal Medicine

## 2014-12-15 ENCOUNTER — Encounter: Payer: Self-pay | Admitting: Internal Medicine

## 2014-12-15 ENCOUNTER — Ambulatory Visit (INDEPENDENT_AMBULATORY_CARE_PROVIDER_SITE_OTHER): Payer: Medicare Other | Admitting: Internal Medicine

## 2014-12-15 VITALS — BP 138/68 | HR 65 | Wt 112.0 lb

## 2014-12-15 DIAGNOSIS — Z01810 Encounter for preprocedural cardiovascular examination: Secondary | ICD-10-CM | POA: Diagnosis not present

## 2014-12-15 DIAGNOSIS — R000 Tachycardia, unspecified: Secondary | ICD-10-CM

## 2014-12-15 DIAGNOSIS — Z01818 Encounter for other preprocedural examination: Secondary | ICD-10-CM

## 2014-12-15 DIAGNOSIS — R0989 Other specified symptoms and signs involving the circulatory and respiratory systems: Secondary | ICD-10-CM | POA: Diagnosis not present

## 2014-12-15 DIAGNOSIS — R0600 Dyspnea, unspecified: Secondary | ICD-10-CM

## 2014-12-15 DIAGNOSIS — Z23 Encounter for immunization: Secondary | ICD-10-CM

## 2014-12-15 NOTE — Progress Notes (Signed)
Pre visit review using our clinic review tool, if applicable. No additional management support is needed unless otherwise documented below in the visit note. 

## 2014-12-15 NOTE — Progress Notes (Signed)
Subjective:  Patient ID: Theresa Hampton, female    DOB: 08-Jul-1919  Age: 81 y.o. MRN: [id number]  CC: No chief complaint on file.   HPI  IM consult  Req by Dr Larose Kells presents for pre-op clearance (L THR 01/06/15).  Hx: F/u HTN, wt loss, grief, OA - stable  Past Medical History  Diagnosis Date  . Anemia     iron deficiency  . Hyperlipidemia   . Osteoarthritis     Dr. Maureen Ralphs  . Peptic ulcer disease 2003  . Vitamin D deficiency   . Allergic rhinitis   . Chronic sinusitis   . Vitamin B12 deficiency   . Chronic kidney disease     Pyelo 2014   No past surgical history on file.  reports that she has never smoked. She does not have any smokeless tobacco history on file. She reports that she does not drink alcohol or use illicit drugs. family history includes Arthritis in her mother; Hypertension in an other family member. Allergies  Allergen Reactions  . Lidocaine Hcl      Outpatient Prescriptions Prior to Visit  Medication Sig Dispense Refill  . aspirin 325 MG EC tablet Take 325 mg by mouth daily. Take 1/2 daily     . celecoxib (CELEBREX) 200 MG capsule TAKE 1 CAPSULE BY MOUTH DAILY 30 capsule 5  . Cholecalciferol (VITAMIN D3) 1000 UNITS tablet Take 2,000 Units by mouth daily.      . clobetasol cream (TEMOVATE) 4.03 % Apply 1 application topically 2 (two) times daily. Use sparingly. 30 g 2  . clonazePAM (KLONOPIN) 0.25 MG disintegrating tablet Take 1 tablet (0.25 mg total) by mouth 2 (two) times daily as needed (anxiety). 60 tablet 5  . estradiol (CLIMARA - DOSED IN MG/24 HR) 0.05 mg/24hr patch PLACE 1 NEW PATCH ONTO THE SKIN ONCE A WEEK 12 patch 3  . fluticasone (FLONASE) 50 MCG/ACT nasal spray Place 1 spray into the nose daily. 16 g 3  . gabapentin (NEURONTIN) 100 MG capsule Take 1 capsule (100 mg total) by mouth at bedtime. 90 capsule 3  . HYDROcodone-acetaminophen (NORCO/VICODIN) 5-325 MG per tablet Take 1 tablet by mouth 2 (two) times daily as  needed for moderate pain. 60 tablet 0  . metoprolol tartrate (LOPRESSOR) 25 MG tablet TAKE 1 TABLET BY MOUTH TWICE A DAY 60 tablet 2  . mirtazapine (REMERON) 15 MG tablet 1 po before dinner 4-5 pm 30 tablet 5  . Omega-3 Fatty Acids (OMEGA-3 FISH OIL PO) Take 1 capsule by mouth daily.    . ondansetron (ZOFRAN) 4 MG tablet TAKE 1 TABLET BY MOUTH EVERY 8 HOURS AS NEEDED 20 tablet 0  . progesterone (PROMETRIUM) 200 MG capsule TAKE 1 CAPSULE BY MOUTH AT BEDTIME ON DAYS 1-12 OF EVERY 3RD MONTH 12 capsule 3  . vitamin B-12 (CYANOCOBALAMIN) 500 MCG tablet Take 500 mcg by mouth daily.       No facility-administered medications prior to visit.   Past Medical History  Diagnosis Date  . Anemia     iron deficiency  . Hyperlipidemia   . Osteoarthritis     Dr. Maureen Ralphs  . Peptic ulcer disease 2003  . Vitamin D deficiency   . Allergic rhinitis   . Chronic sinusitis   . Vitamin B12 deficiency   . Chronic kidney disease     Pyelo 2014   No past surgical history on file.  reports that she has never smoked. She does not have any smokeless  tobacco history on file. She reports that she does not drink alcohol or use illicit drugs. family history includes Arthritis in her mother; Hypertension in an other family member. Allergies  Allergen Reactions  . Lidocaine Hcl     ROS Review of Systems  Constitutional: Negative for chills, activity change, appetite change, fatigue and unexpected weight change.  HENT: Negative for congestion, mouth sores, postnasal drip, sinus pressure and voice change.   Eyes: Negative for visual disturbance.  Respiratory: Positive for shortness of breath. Negative for cough, chest tightness and stridor.   Cardiovascular: Negative for chest pain, palpitations and leg swelling.  Gastrointestinal: Negative for nausea, vomiting, abdominal pain and blood in stool.  Genitourinary: Negative for frequency, difficulty urinating and vaginal pain.  Musculoskeletal: Positive for back pain,  arthralgias and gait problem.  Skin: Negative for pallor and rash.  Neurological: Negative for dizziness, tremors, weakness, numbness and headaches.  Psychiatric/Behavioral: Positive for decreased concentration. Negative for suicidal ideas, confusion, sleep disturbance, dysphoric mood and agitation. The patient is nervous/anxious. The patient is not hyperactive.     Objective:  BP 138/68 mmHg  Pulse 65  Wt 112 lb (50.803 kg)  SpO2 96%  BP Readings from Last 3 Encounters:  12/15/14 138/68  10/14/14 118/88  07/10/14 160/80    Wt Readings from Last 3 Encounters:  12/15/14 112 lb (50.803 kg)  10/14/14 111 lb (50.349 kg)  07/10/14 110 lb (49.896 kg)    Physical Exam  Constitutional: She appears well-developed. No distress.  Thin  HENT:  Head: Normocephalic.  Right Ear: External ear normal.  Left Ear: External ear normal.  Nose: Nose normal.  Mouth/Throat: Oropharynx is clear and moist.  Eyes: Conjunctivae are normal. Pupils are equal, round, and reactive to light. Right eye exhibits no discharge. Left eye exhibits no discharge.  Neck: Normal range of motion. Neck supple. No JVD present. No tracheal deviation present. No thyromegaly present.  Cardiovascular: Normal rate, regular rhythm and normal heart sounds.   Pulmonary/Chest: No stridor. No respiratory distress. She has no wheezes.  Abdominal: Soft. Bowel sounds are normal. She exhibits no distension and no mass. There is no tenderness. There is no rebound and no guarding.  Musculoskeletal: She exhibits tenderness. She exhibits no edema.  Lymphadenopathy:    She has no cervical adenopathy.  Neurological: She displays normal reflexes. No cranial nerve deficit. She exhibits normal muscle tone. Coordination abnormal.  Skin: No rash noted. No erythema.  Psychiatric: She has a normal mood and affect. Her behavior is normal. Judgment and thought content normal.  L car bruit EKG ok  Lab Results  Component Value Date   WBC 8.3  10/14/2014   HGB 12.1 10/14/2014   HCT 36.8 10/14/2014   PLT 279.0 10/14/2014   GLUCOSE 97 10/14/2014   CHOL 169 05/20/2012   TRIG 75.0 05/20/2012   HDL 66.60 05/20/2012   LDLDIRECT 125.4 05/10/2011   LDLCALC 87 05/20/2012   ALT 11 04/06/2014   AST 17 04/06/2014   NA 137 10/14/2014   K 3.8 10/14/2014   CL 104 10/14/2014   CREATININE 0.72 10/14/2014   BUN 16 10/14/2014   CO2 28 10/14/2014   TSH 2.72 06/04/2014   HGBA1C 5.6 09/11/2008    Dg Chest 2 View  06/06/2014   CLINICAL DATA:  Chest pain.  EXAM: CHEST  2 VIEW  COMPARISON:  November 07, 2007.  FINDINGS: The heart size and mediastinal contours are within normal limits. Both lungs are clear. No pneumothorax or pleural effusion is  noted. The visualized skeletal structures are unremarkable.  IMPRESSION: No acute cardiopulmonary abnormality seen.   Electronically Signed   By: Sabino Dick M.D.   On: 06/06/2014 07:42    Assessment & Plan:   There are no diagnoses linked to this encounter. I am having Ms. Wortley maintain her aspirin, vitamin B-12, cholecalciferol, fluticasone, ondansetron, clobetasol cream, progesterone, Omega-3 Fatty Acids (OMEGA-3 FISH OIL PO), HYDROcodone-acetaminophen, mirtazapine, celecoxib, estradiol, metoprolol tartrate, clonazePAM, and gabapentin.  No orders of the defined types were placed in this encounter.     Follow-up: No Follow-up on file.  Walker Kehr, MD

## 2014-12-16 ENCOUNTER — Encounter: Payer: Self-pay | Admitting: Internal Medicine

## 2014-12-16 DIAGNOSIS — Z01818 Encounter for other preprocedural examination: Secondary | ICD-10-CM | POA: Insufficient documentation

## 2014-12-16 NOTE — Assessment & Plan Note (Signed)
The pt is clear for surgery assuming that her pre-op Tests/labs are acceptable. I ordered cardiac ECHO and Carotid doppler US. Theresa Hampton is a high risk for peri-operative complications due to her age. Thank you!

## 2014-12-16 NOTE — Assessment & Plan Note (Signed)
US

## 2014-12-16 NOTE — Assessment & Plan Note (Signed)
Better Heart murmur ECHO Pulse Readings from Last 3 Encounters:  12/15/14 65  10/14/14 91  07/10/14 99

## 2014-12-21 ENCOUNTER — Other Ambulatory Visit (HOSPITAL_COMMUNITY): Payer: Self-pay | Admitting: Internal Medicine

## 2014-12-21 DIAGNOSIS — R0989 Other specified symptoms and signs involving the circulatory and respiratory systems: Secondary | ICD-10-CM

## 2014-12-21 DIAGNOSIS — Z01818 Encounter for other preprocedural examination: Secondary | ICD-10-CM

## 2014-12-22 ENCOUNTER — Other Ambulatory Visit: Payer: Self-pay

## 2014-12-22 ENCOUNTER — Ambulatory Visit (HOSPITAL_COMMUNITY): Payer: Medicare Other | Attending: Cardiovascular Disease

## 2014-12-22 ENCOUNTER — Inpatient Hospital Stay (HOSPITAL_COMMUNITY): Admission: RE | Admit: 2014-12-22 | Payer: Medicare Other | Source: Ambulatory Visit

## 2014-12-22 DIAGNOSIS — I517 Cardiomegaly: Secondary | ICD-10-CM | POA: Diagnosis not present

## 2014-12-22 DIAGNOSIS — E785 Hyperlipidemia, unspecified: Secondary | ICD-10-CM | POA: Insufficient documentation

## 2014-12-22 DIAGNOSIS — I358 Other nonrheumatic aortic valve disorders: Secondary | ICD-10-CM | POA: Diagnosis not present

## 2014-12-22 DIAGNOSIS — R0600 Dyspnea, unspecified: Secondary | ICD-10-CM

## 2014-12-22 DIAGNOSIS — I071 Rheumatic tricuspid insufficiency: Secondary | ICD-10-CM | POA: Insufficient documentation

## 2014-12-22 DIAGNOSIS — R000 Tachycardia, unspecified: Secondary | ICD-10-CM | POA: Diagnosis not present

## 2014-12-23 ENCOUNTER — Ambulatory Visit (HOSPITAL_COMMUNITY)
Admission: RE | Admit: 2014-12-23 | Discharge: 2014-12-23 | Disposition: A | Discharge status: Home / Self Care | Payer: Medicare Other | Source: Ambulatory Visit | Attending: Internal Medicine | Admitting: Internal Medicine

## 2014-12-23 ENCOUNTER — Other Ambulatory Visit: Payer: Self-pay | Admitting: Internal Medicine

## 2014-12-23 DIAGNOSIS — R0989 Other specified symptoms and signs involving the circulatory and respiratory systems: Secondary | ICD-10-CM

## 2014-12-23 DIAGNOSIS — I6523 Occlusion and stenosis of bilateral carotid arteries: Secondary | ICD-10-CM | POA: Diagnosis not present

## 2014-12-23 DIAGNOSIS — Z01818 Encounter for other preprocedural examination: Secondary | ICD-10-CM | POA: Diagnosis not present

## 2014-12-23 MED ORDER — AMLODIPINE BESYLATE 2.5 MG PO TABS: 2.5000 mg | ORAL_TABLET | Freq: Every day | ORAL | Status: DC

## 2014-12-28 ENCOUNTER — Other Ambulatory Visit: Payer: Self-pay | Admitting: Surgical

## 2014-12-28 MED ORDER — DEXAMETHASONE SODIUM PHOSPHATE 10 MG/ML IJ SOLN
10.0000 mg | Freq: Once | INTRAMUSCULAR | Status: AC
  Administered 2015-01-06: 10 mg via INTRAVENOUS

## 2014-12-29 NOTE — Patient Instructions (Addendum)
YOUR PROCEDURE IS SCHEDULED ON :  01/06/15  REPORT TO [place] MAIN ENTRANCE FOLLOW SIGNS TO EAST ELEVATOR - GO TO 3rd FLOOR CHECK IN AT 3 EAST NURSES STATION (SHORT STAY) AT:  7:30 AM  CALL THIS NUMBER IF YOU HAVE PROBLEMS THE MORNING OF SURGERY [phone]  REMEMBER:ONLY 1 PER PERSON MAY GO TO SHORT STAY WITH YOU TO GET READY THE MORNING OF YOUR SURGERY  DO NOT EAT FOOD OR DRINK LIQUIDS AFTER MIDNIGHT  TAKE THESE MEDICINES THE MORNING OF SURGERY: METOPROLOL / AMLODIPINE / MAY TAKE HYDROCODONE IF NEEDED / MAY TAKE CLONAZEPAM IF NEEDED  YOU MAY NOT HAVE ANY METAL ON YOUR BODY INCLUDING HAIR PINS AND PIERCING'S. DO NOT WEAR JEWELRY, MAKEUP, LOTIONS, POWDERS OR PERFUMES. DO NOT WEAR NAIL POLISH. DO NOT SHAVE 48 HRS PRIOR TO SURGERY. MEN MAY SHAVE FACE AND NECK.  DO NOT [facility] IS NOT RESPONSIBLE FOR VALUABLES.  CONTACTS, DENTURES OR PARTIALS MAY NOT BE WORN TO SURGERY. LEAVE SUITCASE IN CAR. CAN BE BROUGHT TO ROOM AFTER SURGERY.  PATIENTS DISCHARGED THE DAY OF SURGERY WILL NOT BE ALLOWED TO DRIVE HOME.  PLEASE READ OVER THE FOLLOWING INSTRUCTION SHEETS _________________________________________________________________________________                                          [place] - PREPARING FOR SURGERY  Before surgery, you can play an important role.  Because skin is not sterile, your skin needs to be as free of germs as possible.  You can reduce the number of germs on your skin by washing with CHG (chlorahexidine gluconate) soap before surgery.  CHG is an antiseptic cleaner which kills germs and bonds with the skin to continue killing germs even after washing. Please DO NOT use if you have an allergy to CHG or antibacterial soaps.  If your skin becomes reddened/irritated stop using the CHG and inform your nurse when you arrive at Short Stay. Do not shave (including legs and underarms) for at least 48 hours prior to the first  CHG shower.  You may shave your face. Please follow these instructions carefully:   1.  Shower with CHG Soap the night before surgery and the  morning of Surgery.   2.  If you choose to wash your hair, wash your hair first as usual with your  normal  Shampoo.   3.  After you shampoo, rinse your hair and body thoroughly to remove the  shampoo.                                         4.  Use CHG as you would any other liquid soap.  You can apply chg directly  to the skin and wash . Gently wash with scrungie or clean wascloth    5.  Apply the CHG Soap to your body ONLY FROM THE NECK DOWN.   Do not use on open                           Wound or open sores. Avoid contact with eyes, ears mouth and genitals (private parts).  Genitals (private parts) with your normal soap.              6.  Wash thoroughly, paying special attention to the area where your surgery  will be performed.   7.  Thoroughly rinse your body with warm water from the neck down.   8.  DO NOT shower/wash with your normal soap after using and rinsing off  the CHG Soap .                9.  Pat yourself dry with a clean towel.             10.  Wear clean night clothes to bed after shower             11.  Place clean sheets on your bed the night of your first shower and do not  sleep with pets.  Day of Surgery : Do not apply any lotions/deodorants the morning of surgery.  Please wear clean clothes to the hospital/surgery center.  FAILURE TO FOLLOW THESE INSTRUCTIONS MAY RESULT IN THE CANCELLATION OF YOUR SURGERY    PATIENT SIGNATURE_________________________________  ______________________________________________________________________     Theresa Hampton  An incentive spirometer is a tool that can help keep your lungs clear and active. This tool measures how well you are filling your lungs with each breath. Taking long deep breaths may help reverse or decrease the chance of developing  breathing (pulmonary) problems (especially infection) following:  A long period of time when you are unable to move or be active. BEFORE THE PROCEDURE   If the spirometer includes an indicator to show your best effort, your nurse or respiratory therapist will set it to a desired goal.  If possible, sit up straight or lean slightly forward. Try not to slouch.  Hold the incentive spirometer in an upright position. INSTRUCTIONS FOR USE   Sit on the edge of your bed if possible, or sit up as far as you can in bed or on a chair.  Hold the incentive spirometer in an upright position.  Breathe out normally.  Place the mouthpiece in your mouth and seal your lips tightly around it.  Breathe in slowly and as deeply as possible, raising the piston or the ball toward the top of the column.  Hold your breath for 3-5 seconds or for as long as possible. Allow the piston or ball to fall to the bottom of the column.  Remove the mouthpiece from your mouth and breathe out normally.  Rest for a few seconds and repeat Steps 1 through 7 at least 10 times every 1-2 hours when you are awake. Take your time and take a few normal breaths between deep breaths.  The spirometer may include an indicator to show your best effort. Use the indicator as a goal to work toward during each repetition.  After each set of 10 deep breaths, practice coughing to be sure your lungs are clear. If you have an incision (the cut made at the time of surgery), support your incision when coughing by placing a pillow or rolled up towels firmly against it. Once you are able to get out of bed, walk around indoors and cough well. You may stop using the incentive spirometer when instructed by your caregiver.  RISKS AND COMPLICATIONS  Take your time so you do not get dizzy or light-headed.  If you are in pain, you may need to take or ask for pain medication before doing incentive spirometry. It is  harder to take a deep breath if you  are having pain. AFTER USE  Rest and breathe slowly and easily.  It can be helpful to keep track of a log of your progress. Your caregiver can provide you with a simple table to help with this. If you are using the spirometer at home, follow these instructions: [place] IF:   You are having difficultly using the spirometer.  You have trouble using the spirometer as often as instructed.  Your pain medication is not giving enough relief while using the spirometer.  You develop fever of 100.5 F (38.1 C) or higher. SEEK IMMEDIATE MEDICAL CARE IF:   You cough up bloody sputum that had not been present before.  You develop fever of 102 F (38.9 C) or greater.  You develop worsening pain at or near the incision site. MAKE SURE YOU:   Understand these instructions.  Will watch your condition.  Will get help right away if you are not doing well or get worse. Document Released: 09/04/2006 Document Revised: 07/17/2011 Document Reviewed: 11/05/2006 ExitCare Patient Information 2014 Exit[employer], [place].   ________________________________________________________________________  WHAT IS A BLOOD TRANSFUSION? Blood Transfusion Information  A transfusion is the replacement of blood or some of its parts. Blood is made up of multiple cells which provide different functions.  Red blood cells carry oxygen and are used for blood loss replacement.  White blood cells fight against infection.  Platelets control bleeding.  Plasma helps clot blood.  Other blood products are available for specialized needs, such as hemophilia or other clotting disorders. BEFORE THE TRANSFUSION  Who gives blood for transfusions?   Healthy volunteers who are fully evaluated to make sure their blood is safe. This is blood bank blood. Transfusion therapy is the safest it has ever been in the practice of medicine. Before blood is taken from a donor, a complete history is taken to make sure that person has  no history of diseases nor engages in risky social behavior (examples are intravenous drug use or sexual activity with multiple partners). The donor's travel history is screened to minimize risk of transmitting infections, such as malaria. The donated blood is tested for signs of infectious diseases, such as HIV and hepatitis. The blood is then tested to be sure it is compatible with you in order to minimize the chance of a transfusion reaction. If you or a relative donates blood, this is often done in anticipation of surgery and is not appropriate for emergency situations. It takes many days to process the donated blood. RISKS AND COMPLICATIONS Although transfusion therapy is very safe and saves many lives, the main dangers of transfusion include:   Getting an infectious disease.  Developing a transfusion reaction. This is an allergic reaction to something in the blood you were given. Every precaution is taken to prevent this. The decision to have a blood transfusion has been considered carefully by your caregiver before blood is given. Blood is not given unless the benefits outweigh the risks. AFTER THE TRANSFUSION  Right after receiving a blood transfusion, you will usually feel much better and more energetic. This is especially true if your red blood cells have gotten low (anemic). The transfusion raises the level of the red blood cells which carry oxygen, and this usually causes an energy increase.  The nurse administering the transfusion will monitor you carefully for complications. HOME CARE INSTRUCTIONS  No special instructions are needed after a transfusion. You may find your energy is better. Speak  with your caregiver about any limitations on activity for underlying diseases you may have. SEEK MEDICAL CARE IF:   Your condition is not improving after your transfusion.  You develop redness or irritation at the intravenous (IV) site. SEEK IMMEDIATE MEDICAL CARE IF:  Any of the following  symptoms occur over the next 12 hours:  Shaking chills.  You have a temperature by mouth above 102 F (38.9 C), not controlled by medicine.  Chest, back, or muscle pain.  People around you feel you are not acting correctly or are confused.  Shortness of breath or difficulty breathing.  Dizziness and fainting.  You get a rash or develop hives.  You have a decrease in urine output.  Your urine turns a dark color or changes to pink, red, or brown. Any of the following symptoms occur over the next 10 days:  You have a temperature by mouth above 102 F (38.9 C), not controlled by medicine.  Shortness of breath.  Weakness after normal activity.  The white part of the eye turns yellow (jaundice).  You have a decrease in the amount of urine or are urinating less often.  Your urine turns a dark color or changes to pink, red, or brown. Document Released: 04/21/2000 Document Revised: 07/17/2011 Document Reviewed: 12/09/2007 [facility] Patient Information 2014 [place], [place].  _______________________________________________________________________

## 2014-12-30 ENCOUNTER — Encounter (HOSPITAL_COMMUNITY): Payer: Self-pay

## 2014-12-30 ENCOUNTER — Encounter (HOSPITAL_COMMUNITY)
Admission: RE | Admit: 2014-12-30 | Discharge: 2014-12-30 | Disposition: A | Discharge status: Home / Self Care | Payer: Medicare Other | Source: Ambulatory Visit | Attending: Orthopedic Surgery | Admitting: Orthopedic Surgery

## 2014-12-30 DIAGNOSIS — E559 Vitamin D deficiency, unspecified: Secondary | ICD-10-CM | POA: Diagnosis not present

## 2014-12-30 DIAGNOSIS — R569 Unspecified convulsions: Secondary | ICD-10-CM | POA: Diagnosis not present

## 2014-12-30 DIAGNOSIS — Z01812 Encounter for preprocedural laboratory examination: Secondary | ICD-10-CM | POA: Diagnosis not present

## 2014-12-30 DIAGNOSIS — E538 Deficiency of other specified B group vitamins: Secondary | ICD-10-CM | POA: Diagnosis not present

## 2014-12-30 DIAGNOSIS — K279 Peptic ulcer, site unspecified, unspecified as acute or chronic, without hemorrhage or perforation: Secondary | ICD-10-CM | POA: Insufficient documentation

## 2014-12-30 DIAGNOSIS — R252 Cramp and spasm: Secondary | ICD-10-CM | POA: Insufficient documentation

## 2014-12-30 DIAGNOSIS — D509 Iron deficiency anemia, unspecified: Secondary | ICD-10-CM | POA: Insufficient documentation

## 2014-12-30 DIAGNOSIS — N189 Chronic kidney disease, unspecified: Secondary | ICD-10-CM | POA: Insufficient documentation

## 2014-12-30 DIAGNOSIS — M1990 Unspecified osteoarthritis, unspecified site: Secondary | ICD-10-CM | POA: Diagnosis not present

## 2014-12-30 DIAGNOSIS — E785 Hyperlipidemia, unspecified: Secondary | ICD-10-CM | POA: Diagnosis not present

## 2014-12-30 DIAGNOSIS — I10 Essential (primary) hypertension: Secondary | ICD-10-CM | POA: Insufficient documentation

## 2014-12-30 HISTORY — DX: Cramp and spasm: R25.2

## 2014-12-30 HISTORY — DX: Essential (primary) hypertension: I10

## 2014-12-30 HISTORY — DX: Unspecified convulsions: R56.9

## 2014-12-30 HISTORY — DX: Personal history of other medical treatment: Z92.89

## 2014-12-30 HISTORY — DX: Other specified postprocedural states: Z98.890

## 2014-12-30 HISTORY — DX: Age-related osteoporosis without current pathological fracture: M81.0

## 2014-12-30 HISTORY — DX: Other specified postprocedural states: R11.2

## 2014-12-30 LAB — COMPREHENSIVE METABOLIC PANEL
ALT: 12 U/L — ABNORMAL LOW (ref 14–54)
AST: 18 U/L (ref 15–41)
Albumin: 3.8 g/dL (ref 3.5–5.0)
Alkaline Phosphatase: 79 U/L (ref 38–126)
Anion gap: 8 (ref 5–15)
BUN: 17 mg/dL (ref 6–20)
CO2: 29 mmol/L (ref 22–32)
Calcium: 9.6 mg/dL (ref 8.9–10.3)
Chloride: 104 mmol/L (ref 101–111)
Creatinine, Ser: 0.85 mg/dL (ref 0.44–1.00)
GFR calc Af Amer: >60 mL/min (ref >60)
GFR calc non Af Amer: 58 mL/min — ABNORMAL LOW (ref >60)
Glucose, Bld: 104 mg/dL — ABNORMAL HIGH (ref 65–99)
Potassium: 4.3 mmol/L (ref 3.5–5.1)
Sodium: 141 mmol/L (ref 135–145)
Total Bilirubin: 0.4 mg/dL (ref 0.3–1.2)
Total Protein: 7.6 g/dL (ref 6.5–8.1)

## 2014-12-30 LAB — CBC WITH DIFFERENTIAL/PLATELET
Basophils Absolute: 0 10*3/uL (ref 0.0–0.1)
Basophils Relative: 1 % (ref 0–1)
Eosinophils Absolute: 0.4 10*3/uL (ref 0.0–0.7)
Eosinophils Relative: 6 % — ABNORMAL HIGH (ref 0–5)
HCT: 36.5 % (ref 36.0–46.0)
Hemoglobin: 11.6 g/dL — ABNORMAL LOW (ref 12.0–15.0)
Lymphocytes Relative: 23 % (ref 12–46)
Lymphs Abs: 1.5 10*3/uL (ref 0.7–4.0)
MCH: 28.4 pg (ref 26.0–34.0)
MCHC: 31.8 g/dL (ref 30.0–36.0)
MCV: 89.5 fL (ref 78.0–100.0)
Monocytes Absolute: 0.6 10*3/uL (ref 0.1–1.0)
Monocytes Relative: 10 % (ref 3–12)
Neutro Abs: 4.1 10*3/uL (ref 1.7–7.7)
Neutrophils Relative %: 60 % (ref 43–77)
Platelets: 262 10*3/uL (ref 150–400)
RBC: 4.08 MIL/uL (ref 3.87–5.11)
RDW: 12.6 % (ref 11.5–15.5)
WBC: 6.8 10*3/uL (ref 4.0–10.5)

## 2014-12-30 LAB — ABO/RH: ABO/RH(D): AB POS

## 2014-12-30 LAB — URINALYSIS, ROUTINE W REFLEX MICROSCOPIC
Bilirubin Urine: NEGATIVE
Glucose, UA: NEGATIVE mg/dL
Hgb urine dipstick: NEGATIVE
Ketones, ur: NEGATIVE mg/dL
Nitrite: NEGATIVE
Protein, ur: NEGATIVE mg/dL
Specific Gravity, Urine: 1.022 (ref 1.005–1.030)
Urobilinogen, UA: 0.2 mg/dL (ref 0.0–1.0)
pH: 5.5 (ref 5.0–8.0)

## 2014-12-30 LAB — SURGICAL PCR SCREEN
MRSA, PCR: NEGATIVE
Staphylococcus aureus: NEGATIVE

## 2014-12-30 LAB — PROTIME-INR
INR: 1.07 (ref 0.00–1.49)
Prothrombin Time: 14.1 seconds (ref 11.6–15.2)

## 2014-12-30 LAB — URINE MICROSCOPIC-ADD ON

## 2015-01-03 ENCOUNTER — Ambulatory Visit: Payer: Self-pay | Admitting: Orthopedic Surgery

## 2015-01-03 NOTE — H&P (Signed)
Theresa Hampton DOB: 1919-10-10 Married / Language: English / Race: White Female Date of Admission:  01/06/2015 CC:  Left Hip Pain History of Present Illness  The patient is a 81 year old female who comes in for a preoperative History and Physical. The patient is scheduled for a left total hip arthroplasty (aneterior) to be performed by Dr. Dione Plover. Aluisio, MD at [facility] on 01/06/2015. The patient is a 81 year old female who presents today for follow up of their hip. The patient is being followed for their left hip pain and osteoarthritis. They are 3 month(s) out from intra-articular injection with Dr. Nelva Bush. Symptoms reported  include: pain, aching and difficulty ambulating. The patient feels that they are doing poorly. Current treatment includes: pain medications. The following medication has been used for pain control: Hydrocodone. The patient has reported improvement of their symptoms with: Cortisone injections (She reports minimal relief with the last injection. Her husband passed away in 06/23/22, and she has been doing a lot more around the house on her own.). She said her hip has gotten progressively worse over the time. She said that is the only thing holding her back. She is extremely active except for the hip. It is limiting what she can and cannot do. She does have pain at rest as well as with activity. She said she has to do more activity since her husband has passed away. She said that she only takes one blood pressure pill and her blood pressure is well controlled. She does not have any other medical issues at all. Risks and benefits of the surgery have been discussed with the patient and they elect to proceed with surgery.  There are on active contraindications to upcoming procedure such as ongoing infection or progressive neurological disease.\  Problem List/Past Medical Osteoarthritis, Hip (715.35) Primary osteoarthritis of left hip  (M16.12) Osteoarthritis Osteoporosis Rheumatoid Arthritis Impaired Hearing Tinnitus Cataract Hypertension Menopause Measles Mumps Rubella  Allergies Xylocaine *LOCAL ANESTHETICS-Parenteral* Caused seizure  Family History Cerebrovascular Accident Sister. sister Osteoarthritis mother  Social History Pain Contract no Tobacco use Never smoker. never smoker Alcohol use never consumed alcohol Children 2 Copy of Drug/Alcohol Rehab (Previously) no Living situation Lives alone. Marital status Widowed. 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 CeleBREX (200MG  Capsule, Oral as needed) Active. Aspirin EC (325MG  Tablet DR, Oral) Active. Lumigan (0.03% Solution, Ophthalmic) Active. Nitrostat (0.4MG  Tab Sublingual, Sublingual as needed) Active. Omega 3 (Oral) Specific dose unknown - Active. MiraLax (Oral as needed) Active. Multivitamins (Oral) Active. Vitamin D Active. temovate 0.05% cream Active. Estradiol Patch 0.05/24hr  Active. Flonase Spray Active. Gabapentin 100 mg Active. Metoprolol 25 mg Active. Remeron 15 mg Active. Zofran 4 mg Active. Progesterone 200mg  Active. Vitamin B-12 500 mcg Active.  Past Surgical History Thyroidectomy; Subtotal Hip Fracture and Surgery right THA 2004 Uterine Surgery 1980's  Review of Systems General Present- Fatigue and Night Sweats. Not Present- Chills, Fever, Memory Loss, Weight Gain and Weight Loss. Skin Not Present- Eczema, Hives, Itching, Lesions and Rash. HEENT Present- Headache, Hearing Loss and Tinnitus. Not Present- Dentures, Double Vision and Visual Loss. Respiratory Not Present- Allergies, Chronic  Cough, Coughing up blood, Shortness of breath at rest and Shortness of breath with exertion. Cardiovascular Not Present- Chest Pain, Difficulty Breathing Lying Down,  Murmur, Palpitations, Racing/skipping heartbeats and Swelling. Gastrointestinal Present- Nausea. Not Present- Abdominal Pain, Bloody Stool, Constipation, Diarrhea, Difficulty Swallowing, Heartburn, Jaundice, Loss of appetitie and Vomiting. Female Genitourinary Present- Urinating at Night. Not Present- Blood in Urine, Discharge, Flank Pain, Incontinence, Painful Urination, Urgency, Urinary frequency, Urinary Retention and Weak urinary stream. Musculoskeletal Present- Joint Pain, Morning Stiffness and Muscle Weakness. Not Present- Back Pain, Joint Swelling, Muscle Pain and Spasms. Neurological Not Present- Blackout spells, Difficulty with balance, Dizziness, Paralysis, Tremor and Weakness. Psychiatric Not Present- Insomnia.  Vitals Weight: 112 lb Height: 65in Weight was reported by patient. Height was reported by patient. Body Surface Area: 1.55 m Body Mass Index: 18.64 kg/m  BP: 102/62 (Sitting, Right Arm, Standard)  Physical Exam General Mental Status -Alert, cooperative and good historian. General Appearance-pleasant, Not in acute distress. Orientation-Oriented X3. Build & Nutrition-Well nourished and Well developed. Note: thin petite frame  Head and Neck Head-normocephalic, atraumatic . Neck Global Assessment - supple, no bruit auscultated on the right, no bruit auscultated on the left. Note: lower partial denture   Eye Pupil - Bilateral-Regular and Round. Motion - Bilateral-EOMI.  Chest and Lung Exam Auscultation Breath sounds - clear at anterior chest wall and clear at posterior chest wall. Adventitious sounds - No Adventitious sounds.  Cardiovascular Auscultation Rhythm - Regular rate and rhythm. Heart Sounds - S1 WNL and S2 WNL. Murmurs & Other Heart Sounds - Auscultation of the heart  reveals - No Murmurs.  Abdomen Palpation/Percussion Tenderness - Abdomen is non-tender to palpation. Rigidity (guarding) - Abdomen is soft. Auscultation Auscultation of the abdomen reveals - Bowel sounds normal.  Female Genitourinary Note: Not done, not pertinent to present illness   Musculoskeletal Note: She is alert and oriented, in no apparent distress. Her right hip has normal range of motion with no discomfort. Left hip, flexion 90, no internal rotation, about 10 external rotation, 10 to 20 of abduction. There is slight tenderness over the greater trochanter.  RADIOGRAPHS Her radiographs AP, pelvis, and lateral left hip, severe end-stage arthritis of the left hip, bone on bone with subchondral cystic formation and some erosion of the femoral head.   Assessment & Plan Primary osteoarthritis of left hip (M16.12) Note:Surgical Plans: Left Total Hip Replacement - Anterior Approach  Disposition: Home versus SNF  PCP: Dr.Plotnikov - Workup is pending at time of H&P  IV TXA  Signed electronically by Joelene Millin, III PA-C

## 2015-01-06 ENCOUNTER — Encounter (HOSPITAL_COMMUNITY): Payer: Self-pay | Admitting: *Deleted

## 2015-01-06 ENCOUNTER — Inpatient Hospital Stay (HOSPITAL_COMMUNITY): Payer: Medicare Other

## 2015-01-06 ENCOUNTER — Inpatient Hospital Stay (HOSPITAL_COMMUNITY): Payer: Medicare Other | Admitting: Anesthesiology

## 2015-01-06 ENCOUNTER — Inpatient Hospital Stay (HOSPITAL_COMMUNITY)
Admission: RE | Admit: 2015-01-06 | Discharge: 2015-01-08 | DRG: 470 | Disposition: A | Discharge status: Home Health | Payer: Medicare Other | Source: Ambulatory Visit | Attending: Orthopedic Surgery | Admitting: Orthopedic Surgery

## 2015-01-06 ENCOUNTER — Encounter (HOSPITAL_COMMUNITY)
Admission: RE | Disposition: A | Discharge status: Home Health | Payer: Self-pay | Source: Ambulatory Visit | Attending: Orthopedic Surgery

## 2015-01-06 DIAGNOSIS — E538 Deficiency of other specified B group vitamins: Secondary | ICD-10-CM | POA: Diagnosis present

## 2015-01-06 DIAGNOSIS — J329 Chronic sinusitis, unspecified: Secondary | ICD-10-CM | POA: Diagnosis present

## 2015-01-06 DIAGNOSIS — E785 Hyperlipidemia, unspecified: Secondary | ICD-10-CM | POA: Diagnosis present

## 2015-01-06 DIAGNOSIS — H9190 Unspecified hearing loss, unspecified ear: Secondary | ICD-10-CM | POA: Diagnosis present

## 2015-01-06 DIAGNOSIS — Z471 Aftercare following joint replacement surgery: Secondary | ICD-10-CM | POA: Diagnosis not present

## 2015-01-06 DIAGNOSIS — N189 Chronic kidney disease, unspecified: Secondary | ICD-10-CM | POA: Diagnosis present

## 2015-01-06 DIAGNOSIS — I129 Hypertensive chronic kidney disease with stage 1 through stage 4 chronic kidney disease, or unspecified chronic kidney disease: Secondary | ICD-10-CM | POA: Diagnosis present

## 2015-01-06 DIAGNOSIS — D509 Iron deficiency anemia, unspecified: Secondary | ICD-10-CM | POA: Diagnosis present

## 2015-01-06 DIAGNOSIS — J309 Allergic rhinitis, unspecified: Secondary | ICD-10-CM | POA: Diagnosis present

## 2015-01-06 DIAGNOSIS — E559 Vitamin D deficiency, unspecified: Secondary | ICD-10-CM | POA: Diagnosis present

## 2015-01-06 DIAGNOSIS — M069 Rheumatoid arthritis, unspecified: Secondary | ICD-10-CM | POA: Diagnosis present

## 2015-01-06 DIAGNOSIS — M810 Age-related osteoporosis without current pathological fracture: Secondary | ICD-10-CM | POA: Diagnosis present

## 2015-01-06 DIAGNOSIS — M169 Osteoarthritis of hip, unspecified: Secondary | ICD-10-CM | POA: Diagnosis not present

## 2015-01-06 DIAGNOSIS — Z96642 Presence of left artificial hip joint: Secondary | ICD-10-CM | POA: Diagnosis not present

## 2015-01-06 DIAGNOSIS — K5900 Constipation, unspecified: Secondary | ICD-10-CM | POA: Diagnosis not present

## 2015-01-06 DIAGNOSIS — I10 Essential (primary) hypertension: Secondary | ICD-10-CM | POA: Diagnosis present

## 2015-01-06 DIAGNOSIS — Z96649 Presence of unspecified artificial hip joint: Secondary | ICD-10-CM

## 2015-01-06 DIAGNOSIS — M1612 Unilateral primary osteoarthritis, left hip: Secondary | ICD-10-CM

## 2015-01-06 HISTORY — PX: TOTAL HIP ARTHROPLASTY: SHX124

## 2015-01-06 LAB — TYPE AND SCREEN
ABO/RH(D): AB POS
Antibody Screen: NEGATIVE

## 2015-01-06 LAB — APTT: APTT: 31 s (ref 24–37)

## 2015-01-06 SURGERY — ARTHROPLASTY, HIP, TOTAL, ANTERIOR APPROACH
Anesthesia: Spinal | Site: Hip | Laterality: Left

## 2015-01-06 MED ORDER — PHENOL 1.4 % MT LIQD: 1.0000 | OROMUCOSAL | Status: DC | PRN

## 2015-01-06 MED ORDER — ONDANSETRON HCL 4 MG/2ML IJ SOLN: 4.0000 mg | Freq: Four times a day (QID) | INTRAMUSCULAR | Status: DC | PRN

## 2015-01-06 MED ORDER — FLEET ENEMA 7-19 GM/118ML RE ENEM: 1.0000 | ENEMA | Freq: Once | RECTAL | Status: DC | PRN

## 2015-01-06 MED ORDER — AMLODIPINE BESYLATE 2.5 MG PO TABS
2.5000 mg | ORAL_TABLET | Freq: Every day | ORAL | Status: DC
  Administered 2015-01-07 – 2015-01-08 (×2): 2.5 mg via ORAL
  Filled 2015-01-06 (×2): qty 1

## 2015-01-06 MED ORDER — LIDOCAINE HCL (CARDIAC) 20 MG/ML IV SOLN
INTRAVENOUS | Status: DC | PRN
  Administered 2015-01-06: 50 mg via INTRAVENOUS

## 2015-01-06 MED ORDER — SODIUM CHLORIDE 0.9 % IV SOLN
INTRAVENOUS | Status: DC
  Administered 2015-01-06: 17:00:00 via INTRAVENOUS

## 2015-01-06 MED ORDER — TRANEXAMIC ACID 1000 MG/10ML IV SOLN
1000.0000 mg | INTRAVENOUS | Status: AC
  Administered 2015-01-06: 1000 mg via INTRAVENOUS
  Filled 2015-01-06: qty 10

## 2015-01-06 MED ORDER — FLUTICASONE PROPIONATE 50 MCG/ACT NA SUSP
1.0000 | Freq: Every day | NASAL | Status: DC
  Administered 2015-01-07: 1 via NASAL
  Filled 2015-01-06: qty 16

## 2015-01-06 MED ORDER — ACETAMINOPHEN 650 MG RE SUPP: 650.0000 mg | Freq: Four times a day (QID) | RECTAL | Status: DC | PRN

## 2015-01-06 MED ORDER — METOPROLOL TARTRATE 25 MG PO TABS
25.0000 mg | ORAL_TABLET | Freq: Two times a day (BID) | ORAL | Status: DC
  Administered 2015-01-07 – 2015-01-08 (×3): 25 mg via ORAL
  Filled 2015-01-06 (×5): qty 1

## 2015-01-06 MED ORDER — METHOCARBAMOL 1000 MG/10ML IJ SOLN
500.0000 mg | Freq: Four times a day (QID) | INTRAVENOUS | Status: DC | PRN
  Administered 2015-01-06: 500 mg via INTRAVENOUS
  Filled 2015-01-06 (×2): qty 5

## 2015-01-06 MED ORDER — GABAPENTIN 100 MG PO CAPS
100.0000 mg | ORAL_CAPSULE | Freq: Every day | ORAL | Status: DC
  Administered 2015-01-06 – 2015-01-07 (×2): 100 mg via ORAL
  Filled 2015-01-06 (×3): qty 1

## 2015-01-06 MED ORDER — METOCLOPRAMIDE HCL 10 MG PO TABS: 5.0000 mg | ORAL_TABLET | Freq: Three times a day (TID) | ORAL | Status: DC | PRN

## 2015-01-06 MED ORDER — CEFAZOLIN SODIUM-DEXTROSE 2-3 GM-% IV SOLR
INTRAVENOUS | Status: AC
  Filled 2015-01-06: qty 50

## 2015-01-06 MED ORDER — EPHEDRINE SULFATE 50 MG/ML IJ SOLN
INTRAMUSCULAR | Status: AC
  Filled 2015-01-06: qty 1

## 2015-01-06 MED ORDER — BUPIVACAINE HCL (PF) 0.25 % IJ SOLN
INTRAMUSCULAR | Status: AC
  Filled 2015-01-06: qty 30

## 2015-01-06 MED ORDER — ACETAMINOPHEN 500 MG PO TABS
1000.0000 mg | ORAL_TABLET | Freq: Four times a day (QID) | ORAL | Status: AC
  Administered 2015-01-06 – 2015-01-07 (×2): 1000 mg via ORAL
  Filled 2015-01-06 (×3): qty 2

## 2015-01-06 MED ORDER — MENTHOL 3 MG MT LOZG: 1.0000 | LOZENGE | OROMUCOSAL | Status: DC | PRN

## 2015-01-06 MED ORDER — TRAMADOL HCL 50 MG PO TABS
50.0000 mg | ORAL_TABLET | Freq: Four times a day (QID) | ORAL | Status: DC | PRN
  Administered 2015-01-06: 50 mg via ORAL
  Filled 2015-01-06 (×2): qty 1

## 2015-01-06 MED ORDER — MORPHINE SULFATE (PF) 2 MG/ML IV SOLN
INTRAVENOUS | Status: AC
  Filled 2015-01-06: qty 1

## 2015-01-06 MED ORDER — BUPIVACAINE HCL (PF) 0.25 % IJ SOLN
INTRAMUSCULAR | Status: DC | PRN
  Administered 2015-01-06: 20 mL

## 2015-01-06 MED ORDER — ONDANSETRON HCL 4 MG/2ML IJ SOLN
INTRAMUSCULAR | Status: DC | PRN
  Administered 2015-01-06: 4 mg via INTRAVENOUS

## 2015-01-06 MED ORDER — CHLORHEXIDINE GLUCONATE 4 % EX LIQD: 60.0000 mL | Freq: Once | CUTANEOUS | Status: DC

## 2015-01-06 MED ORDER — OXYCODONE HCL 5 MG PO TABS
5.0000 mg | ORAL_TABLET | ORAL | Status: DC | PRN
  Administered 2015-01-07 – 2015-01-08 (×6): 5 mg via ORAL
  Filled 2015-01-06 (×6): qty 1

## 2015-01-06 MED ORDER — BUPIVACAINE HCL (PF) 0.75 % IJ SOLN
INTRAMUSCULAR | Status: DC | PRN
  Administered 2015-01-06: 12 mg via INTRATHECAL

## 2015-01-06 MED ORDER — MEPERIDINE HCL 50 MG/ML IJ SOLN
INTRAMUSCULAR | Status: AC
  Filled 2015-01-06: qty 1

## 2015-01-06 MED ORDER — LACTATED RINGERS IV SOLN
INTRAVENOUS | Status: DC
  Administered 2015-01-06: 12:00:00 via INTRAVENOUS
  Administered 2015-01-06: 1000 mL via INTRAVENOUS

## 2015-01-06 MED ORDER — CLONAZEPAM 0.25 MG PO TBDP: 0.2500 mg | ORAL_TABLET | Freq: Two times a day (BID) | ORAL | Status: DC | PRN

## 2015-01-06 MED ORDER — POLYETHYLENE GLYCOL 3350 17 G PO PACK
17.0000 g | PACK | Freq: Every day | ORAL | Status: DC | PRN
  Administered 2015-01-08: 17 g via ORAL
  Filled 2015-01-06: qty 1

## 2015-01-06 MED ORDER — ONDANSETRON HCL 4 MG PO TABS: 4.0000 mg | ORAL_TABLET | Freq: Three times a day (TID) | ORAL | Status: DC | PRN

## 2015-01-06 MED ORDER — ONDANSETRON HCL 4 MG PO TABS: 4.0000 mg | ORAL_TABLET | Freq: Four times a day (QID) | ORAL | Status: DC | PRN

## 2015-01-06 MED ORDER — MIRTAZAPINE 15 MG PO TABS
15.0000 mg | ORAL_TABLET | Freq: Every evening | ORAL | Status: DC
  Administered 2015-01-07: 15 mg via ORAL
  Filled 2015-01-06 (×3): qty 1

## 2015-01-06 MED ORDER — MORPHINE SULFATE (PF) 2 MG/ML IV SOLN
1.0000 mg | INTRAVENOUS | Status: DC | PRN
  Administered 2015-01-06: 1 mg via INTRAVENOUS

## 2015-01-06 MED ORDER — CEFAZOLIN SODIUM-DEXTROSE 2-3 GM-% IV SOLR
2.0000 g | INTRAVENOUS | Status: AC
  Administered 2015-01-06: 2 g via INTRAVENOUS

## 2015-01-06 MED ORDER — DOCUSATE SODIUM 100 MG PO CAPS
100.0000 mg | ORAL_CAPSULE | Freq: Two times a day (BID) | ORAL | Status: DC
  Administered 2015-01-06 – 2015-01-08 (×4): 100 mg via ORAL

## 2015-01-06 MED ORDER — HYDROMORPHONE HCL 1 MG/ML IJ SOLN
INTRAMUSCULAR | Status: AC
  Filled 2015-01-06: qty 1

## 2015-01-06 MED ORDER — CEFAZOLIN SODIUM 1-5 GM-% IV SOLN
1.0000 g | Freq: Four times a day (QID) | INTRAVENOUS | Status: AC
  Administered 2015-01-06 (×2): 1 g via INTRAVENOUS
  Filled 2015-01-06 (×2): qty 50

## 2015-01-06 MED ORDER — BISACODYL 10 MG RE SUPP: 10.0000 mg | Freq: Every day | RECTAL | Status: DC | PRN

## 2015-01-06 MED ORDER — FENTANYL CITRATE (PF) 100 MCG/2ML IJ SOLN
INTRAMUSCULAR | Status: AC
Start: 2015-01-06 — End: 2015-01-06
  Filled 2015-01-06: qty 4

## 2015-01-06 MED ORDER — CEFAZOLIN SODIUM-DEXTROSE 2-3 GM-% IV SOLR
2.0000 g | Freq: Four times a day (QID) | INTRAVENOUS | Status: DC
  Filled 2015-01-06 (×2): qty 50

## 2015-01-06 MED ORDER — PROPOFOL 10 MG/ML IV BOLUS
INTRAVENOUS | Status: AC
  Filled 2015-01-06: qty 20

## 2015-01-06 MED ORDER — DEXAMETHASONE SODIUM PHOSPHATE 10 MG/ML IJ SOLN
INTRAMUSCULAR | Status: AC
  Filled 2015-01-06: qty 1

## 2015-01-06 MED ORDER — HYDROMORPHONE HCL 1 MG/ML IJ SOLN
0.2500 mg | INTRAMUSCULAR | Status: DC | PRN
  Administered 2015-01-06 (×2): 0.5 mg via INTRAVENOUS

## 2015-01-06 MED ORDER — MEPERIDINE HCL 50 MG/ML IJ SOLN
6.2500 mg | INTRAMUSCULAR | Status: DC | PRN
  Administered 2015-01-06: 12.5 mg via INTRAVENOUS

## 2015-01-06 MED ORDER — ACETAMINOPHEN 325 MG PO TABS
650.0000 mg | ORAL_TABLET | Freq: Four times a day (QID) | ORAL | Status: DC | PRN
  Administered 2015-01-07 – 2015-01-08 (×2): 650 mg via ORAL
  Filled 2015-01-06 (×2): qty 2

## 2015-01-06 MED ORDER — METOCLOPRAMIDE HCL 5 MG/ML IJ SOLN: 5.0000 mg | Freq: Three times a day (TID) | INTRAMUSCULAR | Status: DC | PRN

## 2015-01-06 MED ORDER — PROPOFOL INFUSION 10 MG/ML OPTIME
INTRAVENOUS | Status: DC | PRN
  Administered 2015-01-06: 50 ug/kg/min via INTRAVENOUS

## 2015-01-06 MED ORDER — MIDAZOLAM HCL 2 MG/2ML IJ SOLN
INTRAMUSCULAR | Status: AC
Start: 2015-01-06 — End: 2015-01-06
  Filled 2015-01-06: qty 4

## 2015-01-06 MED ORDER — DEXAMETHASONE SODIUM PHOSPHATE 10 MG/ML IJ SOLN
10.0000 mg | Freq: Once | INTRAMUSCULAR | Status: AC
  Administered 2015-01-07: 10 mg via INTRAVENOUS
  Filled 2015-01-06: qty 1

## 2015-01-06 MED ORDER — RIVAROXABAN 10 MG PO TABS
10.0000 mg | ORAL_TABLET | Freq: Every day | ORAL | Status: DC
  Administered 2015-01-07 – 2015-01-08 (×2): 10 mg via ORAL
  Filled 2015-01-06 (×3): qty 1

## 2015-01-06 MED ORDER — EPHEDRINE SULFATE 50 MG/ML IJ SOLN
INTRAMUSCULAR | Status: DC | PRN
  Administered 2015-01-06: 5 mg via INTRAVENOUS
  Administered 2015-01-06: 10 mg via INTRAVENOUS

## 2015-01-06 MED ORDER — METHOCARBAMOL 500 MG PO TABS
500.0000 mg | ORAL_TABLET | Freq: Four times a day (QID) | ORAL | Status: DC | PRN
  Administered 2015-01-07: 500 mg via ORAL
  Filled 2015-01-06: qty 1

## 2015-01-06 MED ORDER — LACTATED RINGERS IV SOLN: INTRAVENOUS | Status: DC

## 2015-01-06 MED ORDER — FENTANYL CITRATE (PF) 100 MCG/2ML IJ SOLN
INTRAMUSCULAR | Status: DC | PRN
  Administered 2015-01-06: 25 ug via INTRAVENOUS

## 2015-01-06 MED ORDER — ONDANSETRON HCL 4 MG/2ML IJ SOLN
INTRAMUSCULAR | Status: AC
  Filled 2015-01-06: qty 2

## 2015-01-06 MED ORDER — SODIUM CHLORIDE 0.9 % IV SOLN: INTRAVENOUS | Status: DC

## 2015-01-06 MED ORDER — DIPHENHYDRAMINE HCL 12.5 MG/5ML PO ELIX: 12.5000 mg | ORAL_SOLUTION | ORAL | Status: DC | PRN

## 2015-01-06 SURGICAL SUPPLY — 43 items
BAG DECANTER FOR FLEXI CONT (MISCELLANEOUS) ×3 IMPLANT
BAG ZIPLOCK 12X15 (MISCELLANEOUS) IMPLANT
BLADE EXTENDED COATED 6.5IN (ELECTRODE) ×3 IMPLANT
BLADE SAG 18X100X1.27 (BLADE) ×3 IMPLANT
CAPT HIP TOTAL 2 ×3 IMPLANT
CLOSURE WOUND 1/2 X4 (GAUZE/BANDAGES/DRESSINGS) ×1
COVER PERINEAL POST (MISCELLANEOUS) ×3 IMPLANT
DECANTER SPIKE VIAL GLASS SM (MISCELLANEOUS) ×3 IMPLANT
DRAPE C-ARM 42X120 X-RAY (DRAPES) ×3 IMPLANT
DRAPE STERI IOBAN 125X83 (DRAPES) ×3 IMPLANT
DRAPE U-SHAPE 47X51 STRL (DRAPES) ×9 IMPLANT
DRSG ADAPTIC 3X8 NADH LF (GAUZE/BANDAGES/DRESSINGS) ×3 IMPLANT
DRSG MEPILEX BORDER 4X4 (GAUZE/BANDAGES/DRESSINGS) ×3 IMPLANT
DRSG MEPILEX BORDER 4X8 (GAUZE/BANDAGES/DRESSINGS) ×3 IMPLANT
DURAPREP 26ML APPLICATOR (WOUND CARE) ×3 IMPLANT
ELECT REM PT RETURN 9FT ADLT (ELECTROSURGICAL) ×3
ELECTRODE REM PT RTRN 9FT ADLT (ELECTROSURGICAL) ×1 IMPLANT
EVACUATOR 1/8 PVC DRAIN (DRAIN) ×3 IMPLANT
FACESHIELD WRAPAROUND (MASK) ×12 IMPLANT
GLOVE BIO SURGEON STRL SZ7.5 (GLOVE) ×3 IMPLANT
GLOVE BIO SURGEON STRL SZ8 (GLOVE) ×3 IMPLANT
GLOVE BIOGEL PI IND STRL 8 (GLOVE) ×2 IMPLANT
GLOVE BIOGEL PI INDICATOR 8 (GLOVE) ×4
GOWN STRL REUS W/TWL LRG LVL3 (GOWN DISPOSABLE) ×3 IMPLANT
GOWN STRL REUS W/TWL XL LVL3 (GOWN DISPOSABLE) ×3 IMPLANT
KIT BASIN OR (CUSTOM PROCEDURE TRAY) ×3 IMPLANT
NDL SAFETY ECLIPSE 18X1.5 (NEEDLE) ×1 IMPLANT
NEEDLE HYPO 18GX1.5 SHARP (NEEDLE) ×2
PACK TOTAL JOINT (CUSTOM PROCEDURE TRAY) ×3 IMPLANT
PEN SKIN MARKING BROAD (MISCELLANEOUS) ×3 IMPLANT
STRIP CLOSURE SKIN 1/2X4 (GAUZE/BANDAGES/DRESSINGS) ×2 IMPLANT
SUT ETHIBOND NAB CT1 #1 30IN (SUTURE) ×3 IMPLANT
SUT MNCRL AB 4-0 PS2 18 (SUTURE) ×3 IMPLANT
SUT VIC AB 2-0 CT1 27 (SUTURE) ×4
SUT VIC AB 2-0 CT1 TAPERPNT 27 (SUTURE) ×2 IMPLANT
SUT VLOC 180 0 24IN GS25 (SUTURE) IMPLANT
SYR 30ML LL (SYRINGE) ×3 IMPLANT
SYR 50ML LL SCALE MARK (SYRINGE) IMPLANT
TOWEL OR 17X26 10 PK STRL BLUE (TOWEL DISPOSABLE) ×3 IMPLANT
TOWEL OR NON WOVEN STRL DISP B (DISPOSABLE) IMPLANT
TRAY FOLEY W/METER SILVER 14FR (SET/KITS/TRAYS/PACK) ×3 IMPLANT
TRAY FOLEY W/METER SILVER 16FR (SET/KITS/TRAYS/PACK) IMPLANT
YANKAUER SUCT BULB TIP 10FT TU (MISCELLANEOUS) ×3 IMPLANT

## 2015-01-06 NOTE — Op Note (Signed)
OPERATIVE REPORT  PREOPERATIVE DIAGNOSIS: Osteoarthritis of the Left hip.   POSTOPERATIVE DIAGNOSIS: Osteoarthritis of the Left  hip.   PROCEDURE: Left total hip arthroplasty, anterior approach.   SURGEON: Gaynelle Arabian, MD   ASSISTANT: Arlee Muslim, Pa-C  ANESTHESIA:  Spinal  ESTIMATED BLOOD LOSS:-* No blood loss amount entered *    DRAINS: Hemovac x1.   COMPLICATIONS: None   CONDITION: PACU - hemodynamically stable.   BRIEF CLINICAL NOTE: Theresa Hampton is a 81 y.o. female who has advanced end-  stage arthritis of her Left  hip with progressively worsening pain and  dysfunction.The patient has failed nonoperative management and presents for  total hip arthroplasty.   PROCEDURE IN DETAIL: After successful administration of spinal  anesthetic, the traction boots for the [facility] bed were placed on both  feet and the patient was placed onto the [facility] bed, boots placed into the leg  holders. The Left hip was then isolated from the perineum with plastic  drapes and prepped and draped in the usual sterile fashion. ASIS and  greater trochanter were marked and a oblique incision was made, starting  at about 1 cm lateral and 2 cm distal to the ASIS and coursing towards  the anterior cortex of the femur. The skin was cut with a 10 blade  through subcutaneous tissue to the level of the fascia overlying the  tensor fascia lata muscle. The fascia was then incised in line with the  incision at the junction of the anterior third and posterior 2/3rd. The  muscle was teased off the fascia and then the interval between the TFL  and the rectus was developed. The Hohmann retractor was then placed at  the top of the femoral neck over the capsule. The vessels overlying the  capsule were cauterized and the fat on top of the capsule was removed.  A Hohmann retractor was then placed anterior underneath the rectus  femoris to give exposure to the entire anterior capsule. A T-shaped   capsulotomy was performed. The edges were tagged and the femoral head  was identified.       Osteophytes are removed off the superior acetabulum.  The femoral neck was then cut in situ with an oscillating saw. Traction  was then applied to the left lower extremity utilizing the [facility]  traction. The femoral head was then removed. Retractors were placed  around the acetabulum and then circumferential removal of the labrum was  performed. Osteophytes were also removed. Reaming starts at 43 mm to  medialize and  Increased in 2 mm increments to 47 mm. We reamed in  approximately 40 degrees of abduction, 20 degrees anteversion. She had a protrusio deformity so I used cancellous reamings to pack the central defect prior to impacting the acetabular shell. A 48 mm  pinnacle acetabular shell was then impacted in anatomic position under  fluoroscopic guidance with excellent purchase. We placed  2 additional dome screws. A 28 mm neutral + 4 marathon liner was then  placed into the acetabular shell.       The femoral lift was then placed along the lateral aspect of the femur  just distal to the vastus ridge. The leg was  externally rotated and capsule  was stripped off the inferior aspect of the femoral neck down to the  level of the lesser trochanter, this was done with electrocautery. The femur was lifted after this was performed. The  leg was then placed  and extended in adducted position to essentially delivering the femur. We also removed the capsule superiorly and the  piriformis from the piriformis fossa to gain excellent exposure of the  proximal femur. Rongeur was used to remove some cancellous bone to get  into the lateral portion of the proximal femur for placement of the  initial starter reamer. The starter broaches was placed  the starter broach  and was shown to go down the center of the canal. Broaching  with the  [place] system was then performed starting at size 8, coursing  Up to size 12.  A size 12 had excellent torsional and rotational  and axial stability. The trial standard offset neck was then placed  with a 28 + 1.5 trial head. The hip was then reduced. We confirmed that  the stem was in the canal both on AP and lateral x-rays. It also has excellent sizing. The hip was reduced with outstanding stability through full extension, full external rotation,  and then flexion in adduction internal rotation. AP pelvis was taken  and the leg lengths were measured and found to be exactly equal. Hip  was then dislocated again and the femoral head and neck removed. The  femoral broach was removed. Size 12 Cor[place] stem with a standard offset  neck was then impacted into the femur following native anteversion. Has  excellent purchase in the canal. Excellent torsional and rotational and  axial stability. It is confirmed to be in the canal on AP and lateral  fluoroscopic views. The 28 + 1.5 metal head was placed and the hip  reduced with outstanding stability. Again AP pelvis was taken and it  confirmed that the leg lengths were equal. The wound was then copiously  irrigated with saline solution and the capsule reattached and repaired  with Ethibond suture. 20 ml of .25% Bupivicaine injected into the capsule and into the edge of the tensor fascia lata as well as subcutaneous tissue. The fascia overlying the tensor fascia lata was  then closed with a running #1 V-Loc. Subcu was closed with interrupted  2-0 Vicryl and subcuticular running 4-0 Monocryl. Incision was cleaned  and dried. Steri-Strips and a bulky sterile dressing applied. Hemovac  drain was hooked to suction and then he was awakened and transported to  recovery in stable condition.        Please note that a surgical assistant was a medical necessity for this procedure to perform it in a safe and expeditious manner. Assistant was necessary to provide appropriate retraction of vital neurovascular structures and to prevent femoral  fracture and allow for anatomic placement of the prosthesis.  Gaynelle Arabian, M.D.

## 2015-01-06 NOTE — H&P (View-Only) (Signed)
Theresa Hampton DOB: 08/27/1919 Married / Language: English / Race: White Female Date of Admission:  01/06/2015 CC:  Left Hip Pain History of Present Illness  The patient is a 81 year old female who comes in for a preoperative History and Physical. The patient is scheduled for a left total hip arthroplasty (aneterior) to be performed by Dr. Frank V. Aluisio, MD at [facility] on 01/06/2015. The patient is a 81 year old female who presents today for follow up of their hip. The patient is being followed for their left hip pain and osteoarthritis. They are 3 month(s) out from intra-articular injection with Dr. Ramos. Symptoms reported  include: pain, aching and difficulty ambulating. The patient feels that they are doing poorly. Current treatment includes: pain medications. The following medication has been used for pain control: Hydrocodone. The patient has reported improvement of their symptoms with: Cortisone injections (She reports minimal relief with the last injection. Her husband passed away in January, and she has been doing a lot more around the house on her own.). She said her hip has gotten progressively worse over the time. She said that is the only thing holding her back. She is extremely active except for the hip. It is limiting what she can and cannot do. She does have pain at rest as well as with activity. She said she has to do more activity since her husband has passed away. She said that she only takes one blood pressure pill and her blood pressure is well controlled. She does not have any other medical issues at all. Risks and benefits of the surgery have been discussed with the patient and they elect to proceed with surgery.  There are on active contraindications to upcoming procedure such as ongoing infection or progressive neurological disease.\  Problem List/Past Medical Osteoarthritis, Hip (715.35) Primary osteoarthritis of left hip  (M16.12) Osteoarthritis Osteoporosis Rheumatoid Arthritis Impaired Hearing Tinnitus Cataract Hypertension Menopause Measles Mumps Rubella  Allergies Xylocaine *LOCAL ANESTHETICS-Parenteral* Caused seizure  Family History Cerebrovascular Accident Sister. sister Osteoarthritis mother  Social History Pain Contract no Tobacco use Never smoker. never smoker Alcohol use never consumed alcohol Children 2 Copy of Drug/Alcohol Rehab (Previously) no Living situation Lives alone. Marital status Widowed. Illicit drug use no Current work status retired Drug/Alcohol Rehab (Currently) no Advance Directives Living Will, Healthcare POA Post-Surgical Plans Home with sitter and family versus Rehab/SNF  Medication History Hydrocodone-Acetaminophen (7.5-325MG Tablet, 1 (one) Oral po BID for pain, Taken starting 12/02/2014) Active. CeleBREX (200MG Capsule, Oral as needed) Active. Aspirin EC (325MG Tablet DR, Oral) Active. Lumigan (0.03% Solution, Ophthalmic) Active. Nitrostat (0.4MG Tab Sublingual, Sublingual as needed) Active. Omega 3 (Oral) Specific dose unknown - Active. MiraLax (Oral as needed) Active. Multivitamins (Oral) Active. Vitamin D Active. temovate 0.05% cream Active. Estradiol Patch 0.05/24hr  Active. Flonase Spray Active. Gabapentin 100 mg Active. Metoprolol 25 mg Active. Remeron 15 mg Active. Zofran 4 mg Active. Progesterone 200mg Active. Vitamin B-12 500 mcg Active.  Past Surgical History Thyroidectomy; Subtotal Hip Fracture and Surgery right THA 2004 Uterine Surgery 1980's  Review of Systems General Present- Fatigue and Night Sweats. Not Present- Chills, Fever, Memory Loss, Weight Gain and Weight Loss. Skin Not Present- Eczema, Hives, Itching, Lesions and Rash. HEENT Present- Headache, Hearing Loss and Tinnitus. Not Present- Dentures, Double Vision and Visual Loss. Respiratory Not Present- Allergies, Chronic  Cough, Coughing up blood, Shortness of breath at rest and Shortness of breath with exertion. Cardiovascular Not Present- Chest Pain, Difficulty Breathing Lying Down,   Murmur, Palpitations, Racing/skipping heartbeats and Swelling. Gastrointestinal Present- Nausea. Not Present- Abdominal Pain, Bloody Stool, Constipation, Diarrhea, Difficulty Swallowing, Heartburn, Jaundice, Loss of appetitie and Vomiting. Female Genitourinary Present- Urinating at Night. Not Present- Blood in Urine, Discharge, Flank Pain, Incontinence, Painful Urination, Urgency, Urinary frequency, Urinary Retention and Weak urinary stream. Musculoskeletal Present- Joint Pain, Morning Stiffness and Muscle Weakness. Not Present- Back Pain, Joint Swelling, Muscle Pain and Spasms. Neurological Not Present- Blackout spells, Difficulty with balance, Dizziness, Paralysis, Tremor and Weakness. Psychiatric Not Present- Insomnia.  Vitals Weight: 112 lb Height: 65in Weight was reported by patient. Height was reported by patient. Body Surface Area: 1.55 m Body Mass Index: 18.64 kg/m  BP: 102/62 (Sitting, Right Arm, Standard)  Physical Exam General Mental Status -Alert, cooperative and good historian. General Appearance-pleasant, Not in acute distress. Orientation-Oriented X3. Build & Nutrition-Well nourished and Well developed. Note: thin petite frame  Head and Neck Head-normocephalic, atraumatic . Neck Global Assessment - supple, no bruit auscultated on the right, no bruit auscultated on the left. Note: lower partial denture   Eye Pupil - Bilateral-Regular and Round. Motion - Bilateral-EOMI.  Chest and Lung Exam Auscultation Breath sounds - clear at anterior chest wall and clear at posterior chest wall. Adventitious sounds - No Adventitious sounds.  Cardiovascular Auscultation Rhythm - Regular rate and rhythm. Heart Sounds - S1 WNL and S2 WNL. Murmurs & Other Heart Sounds - Auscultation of the heart  reveals - No Murmurs.  Abdomen Palpation/Percussion Tenderness - Abdomen is non-tender to palpation. Rigidity (guarding) - Abdomen is soft. Auscultation Auscultation of the abdomen reveals - Bowel sounds normal.  Female Genitourinary Note: Not done, not pertinent to present illness   Musculoskeletal Note: She is alert and oriented, in no apparent distress. Her right hip has normal range of motion with no discomfort. Left hip, flexion 90, no internal rotation, about 10 external rotation, 10 to 20 of abduction. There is slight tenderness over the greater trochanter.  RADIOGRAPHS Her radiographs AP, pelvis, and lateral left hip, severe end-stage arthritis of the left hip, bone on bone with subchondral cystic formation and some erosion of the femoral head.   Assessment & Plan Primary osteoarthritis of left hip (M16.12) Note:Surgical Plans: Left Total Hip Replacement - Anterior Approach  Disposition: Home versus SNF  PCP: Dr.Plotnikov - Workup is pending at time of H&P  IV TXA  Signed electronically by Alexzandrew L Perkins, III PA-C 

## 2015-01-06 NOTE — Anesthesia Postprocedure Evaluation (Signed)
  Anesthesia Post-op Note  Patient: Theresa Hampton  Procedure(s) Performed: Procedure(s) (LRB): LEFT TOTAL HIP ARTHROPLASTY ANTERIOR APPROACH (Left)  Patient Location: PACU  Anesthesia Type: Spinal  Level of Consciousness: awake and alert   Airway and Oxygen Therapy: Patient Spontanous Breathing  Post-op Pain: mild  Post-op Assessment: Post-op Vital signs reviewed, Patient's Cardiovascular Status Stable, Respiratory Function Stable, Patent Airway and No signs of Nausea or vomiting  Last Vitals:  Filed Vitals:   01/06/15 1510  BP: 157/75  Pulse: 98  Temp: 36.6 C  Resp: 16    Post-op Vital Signs: stable   Complications: No apparent anesthesia complications

## 2015-01-06 NOTE — Interval H&P Note (Signed)
History and Physical Interval Note:  01/06/2015 9:14 AM  Theresa Hampton  has presented today for surgery, with the diagnosis of OA OF LEFT HIP  The various methods of treatment have been discussed with the patient and family. After consideration of risks, benefits and other options for treatment, the patient has consented to  Procedure(s): LEFT TOTAL HIP ARTHROPLASTY ANTERIOR APPROACH (Left) as a surgical intervention .  The patient's history has been reviewed, patient examined, no change in status, stable for surgery.  I have reviewed the patient's chart and labs.  Questions were answered to the patient's satisfaction.     [name]

## 2015-01-06 NOTE — Anesthesia Procedure Notes (Signed)
Spinal Patient location during procedure: OR Start time: 01/06/2015 10:01 AM End time: 01/06/2015 10:06 AM Staffing Anesthesiologist: Rod Mae Performed by: anesthesiologist  Preanesthetic Checklist Completed: patient identified, site marked, surgical consent, pre-op evaluation, timeout performed, IV checked, risks and benefits discussed and monitors and equipment checked Spinal Block Patient position: sitting Prep: Betadine Patient monitoring: heart rate, continuous pulse ox and blood pressure Approach: midline Location: L3-4 Injection technique: single-shot Needle Needle type: Spinocan  Needle gauge: 22 G Needle length: 9 cm Assessment Sensory level: T6 Additional Notes Expiration date of kit checked and confirmed. Patient tolerated procedure well, without complications.

## 2015-01-06 NOTE — Evaluation (Signed)
Physical Therapy Evaluation Patient Details Name: Theresa Hampton MRN: [id number] DOB: 1919-10-30 Today's Date: 01/06/2015   History of Present Illness  L DA THA  Clinical Impression  Patient ambulated x 45' today. Felt better in L: hip after moving. Patient will benefit from PT to address problems listed in note below.    Follow Up Recommendations Home health PT;Supervision/Assistance - 24 hour    Equipment Recommendations  None recommended by PT    Recommendations for Other Services       Precautions / Restrictions Precautions Precautions: Fall      Mobility  Bed Mobility Overal bed mobility: Needs Assistance Bed Mobility: Supine to Sit;Sit to Supine     Supine to sit: Min assist Sit to supine: Mod assist   General bed mobility comments: assist legs  Transfers Overall transfer level: Needs assistance Equipment used: Rolling walker (2 wheeled) Transfers: Sit to/from Stand Sit to Stand: Min assist         General transfer comment: cues for hand placement, L leg  Ambulation/Gait Ambulation/Gait assistance: Min assist Ambulation Distance (Feet): 45 Feet Assistive device: Rolling walker (2 wheeled) Gait Pattern/deviations: Step-to pattern;Step-through pattern;Decreased step length - left;Decreased stance time - left     General Gait Details: cues for sequence  Stairs            Wheelchair Mobility    Modified Rankin (Stroke Patients Only)       Balance                                             Pertinent Vitals/Pain Pain Assessment: 0-10 Pain Score: 7  Pain Location: L thigh and hip Pain Descriptors / Indicators: Aching;Throbbing Pain Intervention(s): Limited activity within patient's tolerance;Monitored during session;Premedicated before session;Repositioned;Ice applied    Home Living Family/patient expects to be discharged to:: Private residence Living Arrangements: Alone;Non-relatives/Friends Available Help at  Discharge: Family;Personal care attendant;Available 24 hours/day Type of Home: House Home Access: Stairs to enter   [employer] of Steps: 1 Home Layout: Two level;Able to live on main level with bedroom/bathroom Home Equipment: Bedside commode;Walker - 2 wheels      Prior Function Level of Independence: Independent               Hand Dominance        Extremity/Trunk Assessment               Lower Extremity Assessment: LLE deficits/detail   LLE Deficits / Details: advances L leg  Cervical / Trunk Assessment: Kyphotic  Communication   Communication: No difficulties  Cognition Arousal/Alertness: Awake/alert Behavior During Therapy: WFL for tasks assessed/performed Overall Cognitive Status: Within Functional Limits for tasks assessed                      General Comments      Exercises        Assessment/Plan    PT Assessment Patient needs continued PT services  PT Diagnosis Difficulty walking;Acute pain   PT Problem List Decreased strength;Decreased range of motion;Decreased activity tolerance;Decreased mobility;Decreased knowledge of precautions;Decreased safety awareness;Decreased knowledge of use of DME;Pain  PT Treatment Interventions DME instruction;Gait training;Stair training;Functional mobility training;Therapeutic activities;Therapeutic exercise;Patient/family education   PT Goals (Current goals can be found in the Care Plan section) Acute Rehab PT Goals Patient Stated Goal: to go home PT Goal Formulation: With patient Time For  Goal Achievement: 01/09/15 Potential to Achieve Goals: Good    Frequency 7X/week   Barriers to discharge        Co-evaluation               End of Session   Activity Tolerance: Patient tolerated treatment well Patient left: in bed;with call bell/phone within reach Nurse Communication: Mobility status         Time: 5462-7035 PT Time Calculation (min) (ACUTE ONLY): 26  min   Charges:   PT Evaluation $Initial PT Evaluation Tier I: 1 Procedure PT Treatments $Gait Training: 8-22 mins   PT G Codes:        Claretha Cooper 01/06/2015, 5:15 PM

## 2015-01-06 NOTE — Progress Notes (Signed)
Utilization review completed.  

## 2015-01-06 NOTE — Transfer of Care (Signed)
Immediate Anesthesia Transfer of Care Note  Patient: Theresa Hampton  Procedure(s) Performed: Procedure(s): LEFT TOTAL HIP ARTHROPLASTY ANTERIOR APPROACH (Left)  Patient Location: PACU  Anesthesia Type:MAC  Level of Consciousness: awake, alert  and oriented  Airway & Oxygen Therapy: Patient Spontanous Breathing and Patient connected to face mask oxygen  Post-op Assessment: Report given to RN and Post -op Vital signs reviewed and stable  Post vital signs: Reviewed and stable  Last Vitals:  Filed Vitals:   01/06/15 0714  BP: 135/56  Pulse: 67  Temp: 36.7 C  Resp: 16    Complications: No apparent anesthesia complications

## 2015-01-06 NOTE — Anesthesia Preprocedure Evaluation (Signed)
Anesthesia Evaluation  Patient identified by MRN, date of birth, ID band Patient awake    Reviewed: Allergy & Precautions, H&P , NPO status , Patient's Chart, lab work & pertinent test results, reviewed documented beta blocker date and time   History of Anesthesia Complications (+) PONV  Airway Mallampati: II  TM Distance: >3 FB Neck ROM: full    Dental  (+) Missing, Caps, Dental Advisory Given Lower front teeth missing.  Upper front all capped.:   Pulmonary neg pulmonary ROS,  breath sounds clear to auscultation  Pulmonary exam normal       Cardiovascular Exercise Tolerance: Good hypertension, Pt. on home beta blockers and On Medications Normal cardiovascular examRhythm:regular Rate:Normal     Neuro/Psych Seizures -, Well Controlled,  Burning and weakness in legs. Neuropathy. Seizure with lidocaine. Carotid bruit negative neurological ROS  negative psych ROS   GI/Hepatic negative GI ROS, Neg liver ROS,   Endo/Other  negative endocrine ROS  Renal/GU negative Renal ROSChronic kidney disease - labs all normal  negative genitourinary   Musculoskeletal   Abdominal   Peds  Hematology negative hematology ROS (+)   Anesthesia Other Findings   Reproductive/Obstetrics negative OB ROS                             Anesthesia Physical Anesthesia Plan  ASA: III  Anesthesia Plan: Spinal   Post-op Pain Management:    Induction:   Airway Management Planned: Simple Face Mask  Additional Equipment:   Intra-op Plan:   Post-operative Plan:   Informed Consent: I have reviewed the patients History and Physical, chart, labs and discussed the procedure including the risks, benefits and alternatives for the proposed anesthesia with the patient or authorized representative who has indicated his/her understanding and acceptance.   Dental Advisory Given  Plan Discussed with: CRNA and  Surgeon  Anesthesia Plan Comments:         Anesthesia Quick Evaluation

## 2015-01-07 ENCOUNTER — Encounter (HOSPITAL_COMMUNITY): Payer: Self-pay | Admitting: Orthopedic Surgery

## 2015-01-07 LAB — BASIC METABOLIC PANEL
Anion gap: 7 (ref 5–15)
BUN: 11 mg/dL (ref 6–20)
CALCIUM: 8.5 mg/dL — AB (ref 8.9–10.3)
CHLORIDE: 107 mmol/L (ref 101–111)
CO2: 25 mmol/L (ref 22–32)
CREATININE: 0.52 mg/dL (ref 0.44–1.00)
GFR calc non Af Amer: >60 mL/min (ref >60)
GLUCOSE: 130 mg/dL — AB (ref 65–99)
Potassium: 3.7 mmol/L (ref 3.5–5.1)
Sodium: 139 mmol/L (ref 135–145)

## 2015-01-07 LAB — CBC
HEMATOCRIT: 31.5 % — AB (ref 36.0–46.0)
HEMOGLOBIN: 10.5 g/dL — AB (ref 12.0–15.0)
MCH: 29.1 pg (ref 26.0–34.0)
MCHC: 33.3 g/dL (ref 30.0–36.0)
MCV: 87.3 fL (ref 78.0–100.0)
Platelets: 222 10*3/uL (ref 150–400)
RBC: 3.61 MIL/uL — ABNORMAL LOW (ref 3.87–5.11)
RDW: 12.5 % (ref 11.5–15.5)
WBC: 11 10*3/uL — ABNORMAL HIGH (ref 4.0–10.5)

## 2015-01-07 NOTE — Progress Notes (Signed)
   Subjective: 1 Day Post-Op Procedure(s) (LRB): LEFT TOTAL HIP ARTHROPLASTY ANTERIOR APPROACH (Left) Patient reports pain as mild.   Patient seen in rounds for  Dr. Wynelle Link.  Family in room a bedside. She already walked 45 feet the day of surgery. Patient is well, but has had some minor complaints of pain in the hip, requiring pain medications We will resume therapy today.  Plan is to go Home after hospital stay.  Objective: Vital signs in last 24 hours: Temp:  [97.7 F (36.5 C)-98.5 F (36.9 C)] 98.5 F (36.9 C) (09/01 0900) Pulse Rate:  [73-121] 121 (09/01 0912) Resp:  [10-16] 16 (08/31 2042) BP: (122-165)/(51-75) 132/53 mmHg (09/01 0912) SpO2:  [98 %-100 %] 99 % (09/01 0900)  Intake/Output from previous day:  Intake/Output Summary (Last 24 hours) at 01/07/15 0927 Last data filed at 01/07/15 0600  Gross per 24 hour  Intake   2989 ml  Output   2040 ml  Net    949 ml    Intake/Output this shift: UOP is about 1200 since around [place]  Labs:  Recent Labs  01/07/15 0500  HGB 10.5*    Recent Labs  01/07/15 0500  WBC 11.0*  RBC 3.61*  HCT 31.5*  PLT 222    Recent Labs  01/07/15 0500  NA 139  K 3.7  CL 107  CO2 25  BUN 11  CREATININE 0.52  GLUCOSE 130*  CALCIUM 8.5*   No results for input(s): LABPT, INR in the last 72 hours.  EXAM General - Patient is Alert, Appropriate and Oriented Extremity - Neurovascular intact Sensation intact distally Dorsiflexion/Plantar flexion intact Dressing - dressing C/D/I Motor Function - intact, moving foot and toes well on exam.  Hemovac pulled without difficulty.  Past Medical History  Diagnosis Date  . Anemia     iron deficiency  . Hyperlipidemia   . Osteoarthritis     Dr. Maureen Ralphs  . Peptic ulcer disease 2003  . Vitamin D deficiency   . Allergic rhinitis   . Chronic sinusitis   . Vitamin B12 deficiency   . Chronic kidney disease     Pyelo 2014  . Complication of anesthesia   . PONV (postoperative nausea  and vomiting)   . Hypertension   . Seizures     x1 with Lidocaine  . Leg cramps   . Osteoporosis   . History of transfusion     Assessment/Plan: 1 Day Post-Op Procedure(s) (LRB): LEFT TOTAL HIP ARTHROPLASTY ANTERIOR APPROACH (Left) Principal Problem:   OA (osteoarthritis) of hip  Estimated body mass index is 18.89 kg/(m^2) as calculated from the following:   Height as of this encounter: 5\' 5"  (1.651 m).   Weight as of this encounter: 51.483 kg (113 lb 8 oz). Advance diet Up with therapy Plan for discharge tomorrow Discharge home with home health  DVT Prophylaxis - Xarelto Weight Bearing As Tolerated left Leg Hemovac Pulled Begin Therapy  Arlee Muslim, PA-C Orthopaedic Surgery 01/07/2015, 9:27 AM

## 2015-01-07 NOTE — Care Management Note (Signed)
Case Management Note  Patient Details  Name: Theresa Hampton MRN: [id number] Date of Birth: 06-May-1920  Subjective/Objective:                   LEFT TOTAL HIP ARTHROPLASTY ANTERIOR APPROACH (Left) Action/Plan: Discharge planning  Expected Discharge Date:  01/08/15              Expected Discharge Plan:  [place]  In-House Referral:     Discharge planning Services  CM Consult  Post Acute Care Choice:  Home Health Choice offered to:  Patient  DME Arranged:  N/A DME Agency:  NA  HH Arranged:  PT HH Agency:  [facility]  Status of Service:  Completed, signed off  Medicare Important Message Given:    Date Medicare IM Given:    Medicare IM give by:    Date Additional Medicare IM Given:    Additional Medicare Important Message give by:     If discussed at [place] of Stay Meetings, dates discussed:    Additional Comments: Cm met with pt in room to offer choice of home health agency.  Pt chooses [facility] to render HHPT.  Pt has both 3n1 and rolling walker at home.  Pt has a full time aide at home.  Referral emailed to [employer], Tim.  NO other Cm needs were communicated.   Dellie Catholic, RN 01/07/2015, 1:41 PM

## 2015-01-07 NOTE — Progress Notes (Signed)
Physical Therapy Treatment Patient Details Name: Theresa Hampton MRN: [id number] DOB: 03-10-1920 Today's Date: 01/07/2015    History of Present Illness L DA THA    PT Comments    POD # 1 am session.  Pt OOB in recliner.  Assisted with amb to bathroom to void, assisted with amb in hallway then returned to room to perform THR TE's followed by ICE.  Follow Up Recommendations  Home health PT;Supervision/Assistance - 24 hour     Equipment Recommendations  None recommended by PT    Recommendations for Other Services       Precautions / Restrictions Precautions Precautions: Fall Restrictions Weight Bearing Restrictions: No    Mobility  Bed Mobility               General bed mobility comments: OOB in recliner  Transfers Overall transfer level: Needs assistance Equipment used: Rolling walker (2 wheeled) Transfers: Sit to/from Stand Sit to Stand: Min assist         General transfer comment: 50% VC's on proper tech and hand placement  Ambulation/Gait Ambulation/Gait assistance: Min guard;Min assist Ambulation Distance (Feet): 52 Feet Assistive device: Rolling walker (2 wheeled) Gait Pattern/deviations: Step-to pattern;Decreased stance time - left;Trunk flexed Gait velocity: decreased   General Gait Details: 25% VC's on proper sequencing and proper walker to self distance   Stairs            Wheelchair Mobility    Modified Rankin (Stroke Patients Only)       Balance                                    Cognition Arousal/Alertness: Awake/alert Behavior During Therapy: WFL for tasks assessed/performed Overall Cognitive Status: Within Functional Limits for tasks assessed                      Exercises   Total Hip Replacement TE's 10 reps ankle pumps 10 reps knee presses 10 reps heel slides 10 reps SAQ's 10 reps ABD Followed by ICE    General Comments        Pertinent Vitals/Pain Pain Assessment: 0-10 Pain Score:  4  Pain Location: L hip/thigh Pain Descriptors / Indicators: Constant;Sore;Tender Pain Intervention(s): Monitored during session;Premedicated before session;Repositioned;Ice applied    Home Living                      Prior Function            PT Goals (current goals can now be found in the care plan section) Progress towards PT goals: Progressing toward goals    Frequency  7X/week    PT Plan      Co-evaluation             End of Session Equipment Utilized During Treatment: Gait belt Activity Tolerance: Patient tolerated treatment well Patient left: in chair;with call bell/phone within reach     Time: 4854-6270 PT Time Calculation (min) (ACUTE ONLY): 24 min  Charges:  $Gait Training: 8-22 mins $Therapeutic Exercise: 8-22 mins                    G Codes:      Rica Koyanagi  PTA [facility]  Acute  Rehab Pager      [phone]

## 2015-01-07 NOTE — Evaluation (Signed)
Occupational Therapy Evaluation Patient Details Name: Theresa Hampton MRN: [id number] DOB: August 08, 1919 Today's Date: 01/07/2015    History of Present Illness L DA THA   Clinical Impression   This 81 year old female was admitted for the above surgery.  She will have 24/7 assistance at home. Will follow in acute setting for caregiver education    Follow Up Recommendations  Supervision/Assistance - 24 hour    Equipment Recommendations  None recommended by OT    Recommendations for Other Services       Precautions / Restrictions Precautions Precautions: Fall Restrictions Weight Bearing Restrictions: No      Mobility Bed Mobility   Bed Mobility: Supine to Sit;Sit to Supine     Supine to sit: Min assist     General bed mobility comments: assist for LLE  Transfers   Equipment used: Rolling walker (2 wheeled) Transfers: Sit to/from [facility] Sit to Stand: Min assist Stand pivot transfers: Min assist       General transfer comment: assist to rise and stabilize.  Cues for UE/LE placement    Balance                                            ADL Overall ADL's : Needs assistance/impaired             Lower Body Bathing: Moderate assistance;Sit to/from stand       Lower Body Dressing: Maximal assistance;Sit to/from stand   Toilet Transfer: Minimal assistance;Stand-pivot (to recliner)             General ADL Comments: pt will have 24/7.  Caregiver with her at time of eval:  She assisted with tub bench transfer prior to this sx.  Educated on working within pain free area and sit to stand sequence for safety.  After sitting in the chair for several minutes, pt c/o feeling swimmy-headed.  BP 132/56.  Spoke to Training and development officer     Praxis      Pertinent Vitals/Pain Pain Score: 6  Pain Location: L thigh and hip Pain Descriptors / Indicators: Aching Pain Intervention(s): Limited activity  within patient's tolerance;Monitored during session;Premedicated before session;Repositioned     Hand Dominance     Extremity/Trunk Assessment Upper Extremity Assessment Upper Extremity Assessment: Generalized weakness           Communication Communication Communication: No difficulties   Cognition Arousal/Alertness: Awake/alert Behavior During Therapy: WFL for tasks assessed/performed Overall Cognitive Status: Within Functional Limits for tasks assessed                     General Comments       Exercises       Shoulder Instructions      Home Living Family/patient expects to be discharged to:: Private residence Living Arrangements: Alone;Non-relatives/Friends Available Help at Discharge: Family;Personal care attendant;Available 24 hours/day               Bathroom Shower/Tub: Tub/shower unit;Curtain Shower/tub characteristics: Architectural technologist: Handicapped height     Home Equipment: Bedside commode;Tub bench   Additional Comments: caregiver in room; she will be with pt and daughter will also assist      Prior Functioning/Environment Level of Independence: Independent             OT Diagnosis: Generalized weakness  OT Problem List: Decreased strength;Decreased activity tolerance;Decreased knowledge of use of DME or AE;Pain   OT Treatment/Interventions: Self-care/ADL training;DME and/or AE instruction;Patient/family education    OT Goals(Current goals can be found in the care plan section) Acute Rehab OT Goals Patient Stated Goal: to go home OT Goal Formulation: With patient Time For Goal Achievement: 01/14/15 Potential to Achieve Goals: Good ADL Goals Pt Will Transfer to Toilet: with min guard assist;ambulating;bedside commode Pt Will Perform Tub/Shower Transfer: Tub transfer;with min assist;ambulating;tub bench Additional ADL Goal #1: caregiver/family will safely assist pt with adls and cue for sit to stand/sequence ambulating to  bathroom  OT Frequency: Min 2X/week   Barriers to D/C:            Co-evaluation              End of Session    Activity Tolerance: Patient tolerated treatment well Patient left: in chair;with call bell/phone within reach   Time: 0800-0832 OT Time Calculation (min): 32 min Charges:  OT General Charges $OT Visit: 1 Procedure OT Evaluation $Initial OT Evaluation Tier I: 1 Procedure OT Treatments $Therapeutic Activity: 8-22 mins G-Codes:    Garik Diamant 28-Jan-2015, 8:41 AM  Lesle Chris, OTR/L [phone] 01/28/2015

## 2015-01-07 NOTE — Discharge Instructions (Addendum)
° °Dr. Frank Aluisio °Total Joint Specialist °[facility] °[address] °[place], [place] [address] °[phone] ° °ANTERIOR APPROACH TOTAL HIP REPLACEMENT POSTOPERATIVE DIRECTIONS ° ° °Hip Rehabilitation, Guidelines Following Surgery  °The results of a hip operation are greatly improved after range of motion and muscle strengthening exercises. Follow all safety measures which are given to protect your hip. If any of these exercises cause increased pain or swelling in your joint, decrease the amount until you are comfortable again. Then slowly increase the exercises. Call your caregiver if you have problems or questions.  ° °HOME CARE INSTRUCTIONS  °Remove items at home which could result in a fall. This includes throw rugs or furniture in walking pathways.  °· ICE to the affected hip every three hours for 30 minutes at a time and then as needed for pain and swelling.  Continue to use ice on the hip for pain and swelling from surgery. You may notice swelling that will progress down to the foot and ankle.  This is normal after surgery.  Elevate the leg when you are not up walking on it.   °· Continue to use the breathing machine which will help keep your temperature down.  It is common for your temperature to cycle up and down following surgery, especially at night when you are not up moving around and exerting yourself.  The breathing machine keeps your lungs expanded and your temperature down. ° ° °DIET °You may resume your previous home diet once your are discharged from the hospital. ° °DRESSING / WOUND CARE / SHOWERING °You may shower 3 days after surgery, but keep the wounds dry during showering.  You may use an occlusive plastic wrap (Press'n Seal for example), NO SOAKING/SUBMERGING IN THE BATHTUB.  If the bandage gets wet, change with a clean dry gauze.  If the incision gets wet, pat the wound dry with a clean towel. °You may start showering once you are discharged home but do not  submerge the incision under water. Just pat the incision dry and apply a dry gauze dressing on daily. °Change the surgical dressing daily and reapply a dry dressing each time. ° °ACTIVITY °Walk with your walker as instructed. °Use walker as long as suggested by your caregivers. °Avoid periods of inactivity such as sitting longer than an hour when not asleep. This helps prevent blood clots.  °You may resume a sexual relationship in one month or when given the OK by your doctor.  °You may return to work once you are cleared by your doctor.  °Do not drive a car for 6 weeks or until released by you surgeon.  °Do not drive while taking narcotics. ° °WEIGHT BEARING °Weight bearing as tolerated with assist device (walker, cane, etc) as directed, use it as long as suggested by your surgeon or therapist, typically at least 4-6 weeks. ° °POSTOPERATIVE CONSTIPATION PROTOCOL °Constipation - defined medically as fewer than three stools per week and severe constipation as less than one stool per week. ° °One of the most common issues patients have following surgery is constipation.  Even if you have a regular bowel pattern at home, your normal regimen is likely to be disrupted due to multiple reasons following surgery.  Combination of anesthesia, postoperative narcotics, change in appetite and fluid intake all can affect your bowels.  In order to avoid complications following surgery, here are some recommendations in order to help you during your recovery period. ° °Colace (docusate) - Pick up an over-the-counter   form of Colace or another stool softener and take twice a day as long as you are requiring postoperative pain medications.  Take with a full glass of water daily.  If you experience loose stools or diarrhea, hold the colace until you stool forms back up.  If your symptoms do not get better within 1 week or if they get worse, check with your doctor. ° °Dulcolax (bisacodyl) - Pick up over-the-counter and take as directed  by the product packaging as needed to assist with the movement of your bowels.  Take with a full glass of water.  Use this product as needed if not relieved by Colace only.  ° °MiraLax (polyethylene glycol) - Pick up over-the-counter to have on hand.  MiraLax is a solution that will increase the amount of water in your bowels to assist with bowel movements.  Take as directed and can mix with a glass of water, juice, soda, coffee, or tea.  Take if you go more than two days without a movement. °Do not use MiraLax more than once per day. Call your doctor if you are still constipated or irregular after using this medication for 7 days in a row. ° °If you continue to have problems with postoperative constipation, please contact the office for further assistance and recommendations.  If you experience "the worst abdominal pain ever" or develop nausea or vomiting, please contact the office immediatly for further recommendations for treatment. ° °ITCHING ° If you experience itching with your medications, try taking only a single pain pill, or even half a pain pill at a time.  You can also use Benadryl over the counter for itching or also to help with sleep.  ° °TED HOSE STOCKINGS °Wear the elastic stockings on both legs for three weeks following surgery during the day but you may remove then at night for sleeping. ° °MEDICATIONS °See your medication summary on the “After Visit Summary” that the nursing staff will review with you prior to discharge.  You may have some home medications which will be placed on hold until you complete the course of blood thinner medication.  It is important for you to complete the blood thinner medication as prescribed by your surgeon.  Continue your approved medications as instructed at time of discharge. ° °PRECAUTIONS °If you experience chest pain or shortness of breath - call 911 immediately for transfer to the hospital emergency department.  °If you develop a fever greater that 101 F,  purulent drainage from wound, increased redness or drainage from wound, foul odor from the wound/dressing, or calf pain - CONTACT YOUR SURGEON.   °                                                °FOLLOW-UP APPOINTMENTS °Make sure you keep all of your appointments after your operation with your surgeon and caregivers. You should call the office at the above phone number and make an appointment for approximately two weeks after the date of your surgery or on the date instructed by your surgeon outlined in the "After Visit Summary". ° °RANGE OF MOTION AND STRENGTHENING EXERCISES  °These exercises are designed to help you keep full movement of your hip joint. Follow your caregiver's or physical therapist's instructions. Perform all exercises about fifteen times, three times per day or as directed. Exercise both hips, even if you   have had only one joint replacement. These exercises can be done on a training (exercise) mat, on the floor, on a table or on a bed. Use whatever works the best and is most comfortable for you. Use music or television while you are exercising so that the exercises are a pleasant break in your day. This will make your life better with the exercises acting as a break in routine you can look forward to.  Lying on your back, slowly slide your foot toward your buttocks, raising your knee up off the floor. Then slowly slide your foot back down until your leg is straight again.  Lying on your back spread your legs as far apart as you can without causing discomfort.  Lying on your side, raise your upper leg and foot straight up from the floor as far as is comfortable. Slowly lower the leg and repeat.  Lying on your back, tighten up the muscle in the front of your thigh (quadriceps muscles). You can do this by keeping your leg straight and trying to raise your heel off the floor. This helps strengthen the largest muscle supporting your knee.  Lying on your back, tighten up the muscles of your  buttocks both with the legs straight and with the knee bent at a comfortable angle while keeping your heel on the floor.   IF YOU ARE TRANSFERRED TO A SKILLED REHAB FACILITY If the patient is transferred to a skilled rehab facility following release from the hospital, a list of the current medications will be sent to the facility for the patient to continue.  When discharged from the skilled rehab facility, please have the facility set up the patient's [place] prior to being released. Also, the skilled facility will be responsible for providing the patient with their medications at time of release from the facility to include their pain medication, the muscle relaxants, and their blood thinner medication. If the patient is still at the rehab facility at time of the two week follow up appointment, the skilled rehab facility will also need to assist the patient in arranging follow up appointment in our office and any transportation needs.  MAKE SURE YOU:  Understand these instructions.  Get help right away if you are not doing well or get worse.    Pick up stool softner and laxative for home use following surgery while on pain medications. Do not submerge incision under water. Please use good hand washing techniques while changing dressing each day. May shower starting three days after surgery. Please use a clean towel to pat the incision dry following showers. Continue to use ice for pain and swelling after surgery. Do not use any lotions or creams on the incision until instructed by your surgeon.  Take Xarelto for two and a half more weeks, then discontinue Xarelto. Once the patient has completed the Xarelto, they may resume the 325 mg Aspirin half tablet.   Information on my medicine - XARELTO (Rivaroxaban)  This medication education was reviewed with me or my healthcare representative as part of my discharge preparation.    Why was Xarelto prescribed for  you? Xarelto was prescribed for you to reduce the risk of blood clots forming after orthopedic surgery. The medical term for these abnormal blood clots is venous thromboembolism (VTE).  What do you need to know about xarelto ? Take your Xarelto ONCE DAILY at the same time every day. You may take it either with or without food.  If you  have difficulty swallowing the tablet whole, you may crush it and mix in applesauce just prior to taking your dose.  Take Xarelto exactly as prescribed by your doctor and DO NOT stop taking Xarelto without talking to the doctor who prescribed the medication.  Stopping without other VTE prevention medication to take the place of Xarelto may increase your risk of developing a clot.  After discharge, you should have regular check-up appointments with your healthcare provider that is prescribing your Xarelto.    What do you do if you miss a dose? If you miss a dose, take it as soon as you remember on the same day then continue your regularly scheduled once daily regimen the next day. Do not take two doses of Xarelto on the same day.   Important Safety Information A possible side effect of Xarelto is bleeding. You should call your healthcare provider right away if you experience any of the following: ? Bleeding from an injury or your nose that does not stop. ? Unusual colored urine (red or dark brown) or unusual colored stools (red or black). ? Unusual bruising for unknown reasons. ? A serious fall or if you hit your head (even if there is no bleeding).  Some medicines may interact with Xarelto and might increase your risk of bleeding while on Xarelto. To help avoid this, consult your healthcare provider or pharmacist prior to using any new prescription or non-prescription medications, including herbals, vitamins, non-steroidal anti-inflammatory drugs (NSAIDs) and supplements.  This website has more information on Xarelto: [email or web address]

## 2015-01-07 NOTE — Progress Notes (Signed)
Physical Therapy Treatment Patient Details Name: Theresa Hampton MRN: [id number] DOB: 1919/11/30 Today's Date: 01/07/2015    History of Present Illness L DA THA    PT Comments    POD # 1 pm session.  Assisted pt out of recliner to amb to BR then amb in hallway.  Assisted back to bed.  Progressing well and plans to D/C to home tomorrow.  Pt stated she has a PA friend that will be assisting her at home as well as many family members.   Follow Up Recommendations  Home health PT;Supervision/Assistance - 24 hour     Equipment Recommendations  None recommended by PT    Recommendations for Other Services       Precautions / Restrictions Precautions Precautions: Fall Restrictions Weight Bearing Restrictions: No    Mobility  Bed Mobility               General bed mobility comments: OOB in recliner  Transfers Overall transfer level: Needs assistance Equipment used: Rolling walker (2 wheeled) Transfers: Sit to/from Stand Sit to Stand: Min assist         General transfer comment: 50% VC's on proper tech and hand placement  Ambulation/Gait Ambulation/Gait assistance: Min guard;Min assist Ambulation Distance (Feet): 52 Feet Assistive device: Rolling walker (2 wheeled) Gait Pattern/deviations: Step-to pattern;Decreased stance time - left;Trunk flexed Gait velocity: decreased   General Gait Details: 25% VC's on proper sequencing and proper walker to self distance   Stairs            Wheelchair Mobility    Modified Rankin (Stroke Patients Only)       Balance                                    Cognition Arousal/Alertness: Awake/alert Behavior During Therapy: WFL for tasks assessed/performed Overall Cognitive Status: Within Functional Limits for tasks assessed                      Exercises      General Comments        Pertinent Vitals/Pain Pain Assessment: 0-10 Pain Score: 4  Pain Location: L hip/thigh Pain  Descriptors / Indicators: Constant;Sore;Tender Pain Intervention(s): Monitored during session;Premedicated before session;Repositioned;Ice applied    Home Living                      Prior Function            PT Goals (current goals can now be found in the care plan section) Progress towards PT goals: Progressing toward goals    Frequency  7X/week    PT Plan      Co-evaluation             End of Session Equipment Utilized During Treatment: Gait belt Activity Tolerance: Patient tolerated treatment well Patient left: in chair;with call bell/phone within reach     Time: 1255-1315 PT Time Calculation (min) (ACUTE ONLY): 20 min  Charges:  $Gait Training: 8-22 mins                     G Codes:      Theresa Hampton  PTA [facility]  Acute  Rehab Pager      [phone]

## 2015-01-08 ENCOUNTER — Telehealth: Payer: Self-pay | Admitting: *Deleted

## 2015-01-08 LAB — BASIC METABOLIC PANEL
ANION GAP: 8 (ref 5–15)
BUN: 15 mg/dL (ref 6–20)
CHLORIDE: 109 mmol/L (ref 101–111)
CO2: 28 mmol/L (ref 22–32)
CREATININE: 0.7 mg/dL (ref 0.44–1.00)
Calcium: 8.9 mg/dL (ref 8.9–10.3)
GFR calc non Af Amer: >60 mL/min (ref >60)
Glucose, Bld: 113 mg/dL — ABNORMAL HIGH (ref 65–99)
Potassium: 4.2 mmol/L (ref 3.5–5.1)
Sodium: 145 mmol/L (ref 135–145)

## 2015-01-08 LAB — CBC
HEMATOCRIT: 30.6 % — AB (ref 36.0–46.0)
HEMOGLOBIN: 10.2 g/dL — AB (ref 12.0–15.0)
MCH: 29.4 pg (ref 26.0–34.0)
MCHC: 33.3 g/dL (ref 30.0–36.0)
MCV: 88.2 fL (ref 78.0–100.0)
Platelets: 233 10*3/uL (ref 150–400)
RBC: 3.47 MIL/uL — ABNORMAL LOW (ref 3.87–5.11)
RDW: 13.1 % (ref 11.5–15.5)
WBC: 14.3 10*3/uL — ABNORMAL HIGH (ref 4.0–10.5)

## 2015-01-08 MED ORDER — TRAMADOL HCL 50 MG PO TABS: 50.0000 mg | ORAL_TABLET | Freq: Four times a day (QID) | ORAL | Status: DC | PRN

## 2015-01-08 MED ORDER — MIRTAZAPINE 15 MG PO TABS: ORAL_TABLET | ORAL | Status: DC

## 2015-01-08 MED ORDER — METHOCARBAMOL 500 MG PO TABS: 500.0000 mg | ORAL_TABLET | Freq: Four times a day (QID) | ORAL | Status: DC | PRN

## 2015-01-08 MED ORDER — RIVAROXABAN 10 MG PO TABS: 10.0000 mg | ORAL_TABLET | Freq: Every day | ORAL | Status: DC

## 2015-01-08 MED ORDER — OXYCODONE HCL 5 MG PO TABS: 5.0000 mg | ORAL_TABLET | ORAL | Status: DC | PRN

## 2015-01-08 NOTE — Progress Notes (Signed)
Physical Therapy Treatment Patient Details Name: Theresa Hampton MRN: [id number] DOB: [birth year] Today's Date: 01/08/2015    History of Present Illness L DA THA    PT Comments    POD # 2 assisted pt out of recliner to amb in hallway, practiced one step, assisted to bathroom then returned to recliner when family arrived.  Educated on safe handling during transfers and gait.  Educated on proper tech to assist pt up on step with demonstration.  And addressed all mobility/activty questions.  Pt ready for D/C to home.  Follow Up Recommendations  Home health PT;Supervision/Assistance - 24 hour     Equipment Recommendations  None recommended by PT    Recommendations for Other Services       Precautions / Restrictions Precautions Precautions: Fall Restrictions Weight Bearing Restrictions: No    Mobility  Bed Mobility               General bed mobility comments: OOB in recliner  Transfers Overall transfer level: Needs assistance Equipment used: Rolling walker (2 wheeled)   Sit to Stand: Supervision;Min guard         General transfer comment: 25% VC's on proper hand placement.  Assisted from recliner and on/off toilet  Ambulation/Gait Ambulation/Gait assistance: Supervision;Min guard Ambulation Distance (Feet): 55 Feet Assistive device: Rolling walker (2 wheeled) Gait Pattern/deviations: Step-to pattern;Decreased stance time - left Gait velocity: decreased   General Gait Details: 25% VC's on proper sequencing and proper walker to self distance   Stairs Stairs: Yes Stairs assistance: Min assist Stair Management: No rails;Step to pattern;Forwards;With walker Number of Stairs: 1 General stair comments: performed twice, one step forward with walker and 25% VC's on proper sequencing  Wheelchair Mobility    Modified Rankin (Stroke Patients Only)       Balance                                    Cognition Arousal/Alertness:  Awake/alert Behavior During Therapy: WFL for tasks assessed/performed Overall Cognitive Status: Within Functional Limits for tasks assessed                      Exercises      General Comments        Pertinent Vitals/Pain Pain Assessment: 0-10 Pain Score: 3  Pain Location: L thigh Pain Descriptors / Indicators: Constant;Sore Pain Intervention(s): Monitored during session;Premedicated before session;Repositioned;Ice applied    Home Living                      Prior Function            PT Goals (current goals can now be found in the care plan section) Progress towards PT goals: Progressing toward goals    Frequency  7X/week    PT Plan      Co-evaluation             End of Session Equipment Utilized During Treatment: Gait belt Activity Tolerance: Patient tolerated treatment well Patient left: in chair;with call bell/phone within reach     Time: 1020-1050 PT Time Calculation (min) (ACUTE ONLY): 30 min  Charges:  $Gait Training: 8-22 mins $Therapeutic Activity: 8-22 mins                    G Codes:      Rica Koyanagi  PTA [facility]  Acute  Rehab Pager  [phone]  

## 2015-01-08 NOTE — Progress Notes (Signed)
RN reviewed discharge instructions with patient and family. All questions answered.  Paperwork and prescriptions given.   NT rolled patient down in wheelchair to family car.  

## 2015-01-08 NOTE — Progress Notes (Signed)
OT Cancellation Note  Patient Details Name: Theresa Hampton MRN: [id number] DOB: 31-Jan-1920   Cancelled Treatment:    Reason Eval/Treat Not Completed: Other (comment).  Checked with daughter for any OT needs prior to discharge:  She has none.  Will sign off  Tanvir Hipple 01/08/2015, 12:49 PM  Lesle Chris, OTR/L [phone] 01/08/2015

## 2015-01-08 NOTE — Care Management Important Message (Signed)
Important Message  Patient Details  Name: Theresa Hampton MRN: [id number] Date of Birth: 1920-01-28   Medicare Important Message Given:  Yes-second notification given    Camillo Flaming 01/08/2015, 11:37 AMImportant Message  Patient Details  Name: Theresa Hampton MRN: [id number] Date of Birth: 01/06/1920   Medicare Important Message Given:  Yes-second notification given    Camillo Flaming 01/08/2015, 11:36 AM

## 2015-01-08 NOTE — Progress Notes (Signed)
   Subjective: 2 Days Post-Op Procedure(s) (LRB): LEFT TOTAL HIP ARTHROPLASTY ANTERIOR APPROACH (Left) Patient reports pain as mild.   Patient seen in rounds by Dr. Wynelle Link. Patient is well, and has had no acute complaints or problems Patient is ready to go home today.  Objective: Vital signs in last 24 hours: Temp:  [97.5 F (36.4 C)-98.7 F (37.1 C)] 97.5 F (36.4 C) (09/02 0524) Pulse Rate:  [75-121] 75 (09/02 0634) Resp:  [14-16] 16 (09/01 1945) BP: (95-174)/(48-110) 145/66 mmHg (09/02 0634) SpO2:  [98 %-100 %] 100 % (09/02 0524)  Intake/Output from previous day:  Intake/Output Summary (Last 24 hours) at 01/08/15 0836 Last data filed at 01/07/15 1811  Gross per 24 hour  Intake 1282.5 ml  Output    500 ml  Net  782.5 ml   Labs:  Recent Labs  01/07/15 0500 01/08/15 0522  HGB 10.5* 10.2*    Recent Labs  01/07/15 0500 01/08/15 0522  WBC 11.0* 14.3*  RBC 3.61* 3.47*  HCT 31.5* 30.6*  PLT 222 233    Recent Labs  01/07/15 0500 01/08/15 0522  NA 139 145  K 3.7 4.2  CL 107 109  CO2 25 28  BUN 11 15  CREATININE 0.52 0.70  GLUCOSE 130* 113*  CALCIUM 8.5* 8.9   No results for input(s): LABPT, INR in the last 72 hours.  EXAM: General - Patient is Alert, Appropriate and Oriented Extremity - Neurovascular intact Sensation intact distally Dorsiflexion/Plantar flexion intact Incision - clean, dry, no drainage Motor Function - intact, moving foot and toes well on exam.   Assessment/Plan: 2 Days Post-Op Procedure(s) (LRB): LEFT TOTAL HIP ARTHROPLASTY ANTERIOR APPROACH (Left) Procedure(s) (LRB): LEFT TOTAL HIP ARTHROPLASTY ANTERIOR APPROACH (Left) Past Medical History  Diagnosis Date  . Anemia     iron deficiency  . Hyperlipidemia   . Osteoarthritis     Dr. Maureen Ralphs  . Peptic ulcer disease 2003  . Vitamin D deficiency   . Allergic rhinitis   . Chronic sinusitis   . Vitamin B12 deficiency   . Chronic kidney disease     Pyelo 2014  . Complication  of anesthesia   . PONV (postoperative nausea and vomiting)   . Hypertension   . Seizures     x1 with Lidocaine  . Leg cramps   . Osteoporosis   . History of transfusion    Principal Problem:   OA (osteoarthritis) of hip  Estimated body mass index is 18.89 kg/(m^2) as calculated from the following:   Height as of this encounter: 5\' 5"  (1.651 m).   Weight as of this encounter: 51.483 kg (113 lb 8 oz). Up with therapy Discharge home with home health Diet - Cardiac diet Follow up - in 2 weeks Activity - WBAT Disposition - Home Condition Upon Discharge - Good D/C Meds - See DC Summary DVT Prophylaxis - Xarelto  Arlee Muslim, PA-C Orthopaedic Surgery 01/08/2015, 8:36 AM

## 2015-01-08 NOTE — Telephone Encounter (Signed)
Pt needs Rf on mirtazepine 15 mg. Ok to rf per PCP x 5 refills. Rf sent. See meds.

## 2015-01-08 NOTE — Discharge Summary (Signed)
Physician Discharge Summary   Patient ID: RANEISHA BRESS MRN: [id number] DOB/AGE: 06-27-1919 81 y.o.  Admit date: 01/06/2015 Discharge date: 01/08/2015  Primary Diagnosis:  Osteoarthritis of the Left hip.   Admission Diagnoses:  Past Medical History  Diagnosis Date  . Anemia     iron deficiency  . Hyperlipidemia   . Osteoarthritis     Dr. Maureen Ralphs  . Peptic ulcer disease 2003  . Vitamin D deficiency   . Allergic rhinitis   . Chronic sinusitis   . Vitamin B12 deficiency   . Chronic kidney disease     Pyelo 2014  . Complication of anesthesia   . PONV (postoperative nausea and vomiting)   . Hypertension   . Seizures     x1 with Lidocaine  . Leg cramps   . Osteoporosis   . History of transfusion    Discharge Diagnoses:   Principal Problem:   OA (osteoarthritis) of hip  Estimated body mass index is 18.89 kg/(m^2) as calculated from the following:   Height as of this encounter: '5\' 5"'  (1.651 m).   Weight as of this encounter: 51.483 kg (113 lb 8 oz).  Procedure(s) (LRB): LEFT TOTAL HIP ARTHROPLASTY ANTERIOR APPROACH (Left)   Consults: None  HPI: Theresa Hampton is a 81 y.o. female who has advanced end-  stage arthritis of her Left hip with progressively worsening pain and  dysfunction.The patient has failed nonoperative management and presents for  total hip arthroplasty.   Laboratory Data: Admission on 01/06/2015  Component Date Value Ref Range Status  . aPTT 01/06/2015 31  24 - 37 seconds Final  . WBC 01/07/2015 11.0* 4.0 - 10.5 K/uL Final  . RBC 01/07/2015 3.61* 3.87 - 5.11 MIL/uL Final  . Hemoglobin 01/07/2015 10.5* 12.0 - 15.0 g/dL Final  . HCT 01/07/2015 31.5* 36.0 - 46.0 % Final  . MCV 01/07/2015 87.3  78.0 - 100.0 fL Final  . MCH 01/07/2015 29.1  26.0 - 34.0 pg Final  . MCHC 01/07/2015 33.3  30.0 - 36.0 g/dL Final  . RDW 01/07/2015 12.5  11.5 - 15.5 % Final  . Platelets 01/07/2015 222  150 - 400 K/uL Final  . Sodium 01/07/2015 139  135 - 145 mmol/L  Final  . Potassium 01/07/2015 3.7  3.5 - 5.1 mmol/L Final  . Chloride 01/07/2015 107  101 - 111 mmol/L Final  . CO2 01/07/2015 25  22 - 32 mmol/L Final  . Glucose, Bld 01/07/2015 130* 65 - 99 mg/dL Final  . BUN 01/07/2015 11  6 - 20 mg/dL Final  . Creatinine, Ser 01/07/2015 0.52  0.44 - 1.00 mg/dL Final  . Calcium 01/07/2015 8.5* 8.9 - 10.3 mg/dL Final  . GFR calc non Af Amer 01/07/2015 >60  >60 mL/min Final  . GFR calc Af Amer 01/07/2015 >60  >60 mL/min Final   Comment: (NOTE) The eGFR has been calculated using the CKD EPI equation. This calculation has not been validated in all clinical situations. eGFR's persistently <60 mL/min signify possible Chronic Kidney Disease.   . Anion gap 01/07/2015 7  5 - 15 Final  . WBC 01/08/2015 14.3* 4.0 - 10.5 K/uL Final   WHITE COUNT CONFIRMED ON SMEAR  . RBC 01/08/2015 3.47* 3.87 - 5.11 MIL/uL Final  . Hemoglobin 01/08/2015 10.2* 12.0 - 15.0 g/dL Final  . HCT 01/08/2015 30.6* 36.0 - 46.0 % Final  . MCV 01/08/2015 88.2  78.0 - 100.0 fL Final  . MCH 01/08/2015 29.4  26.0 - 34.0 pg Final  .  MCHC 01/08/2015 33.3  30.0 - 36.0 g/dL Final  . RDW 01/08/2015 13.1  11.5 - 15.5 % Final  . Platelets 01/08/2015 233  150 - 400 K/uL Final  . Sodium 01/08/2015 145  135 - 145 mmol/L Final  . Potassium 01/08/2015 4.2  3.5 - 5.1 mmol/L Final  . Chloride 01/08/2015 109  101 - 111 mmol/L Final  . CO2 01/08/2015 28  22 - 32 mmol/L Final  . Glucose, Bld 01/08/2015 113* 65 - 99 mg/dL Final  . BUN 01/08/2015 15  6 - 20 mg/dL Final  . Creatinine, Ser 01/08/2015 0.70  0.44 - 1.00 mg/dL Final  . Calcium 01/08/2015 8.9  8.9 - 10.3 mg/dL Final  . GFR calc non Af Amer 01/08/2015 >60  >60 mL/min Final  . GFR calc Af Amer 01/08/2015 >60  >60 mL/min Final   Comment: (NOTE) The eGFR has been calculated using the CKD EPI equation. This calculation has not been validated in all clinical situations. eGFR's persistently <60 mL/min signify possible Chronic Kidney Disease.   Georgiann Hahn gap 01/08/2015 8  5 - 15 Final  Hospital Outpatient Visit on 12/30/2014  Component Date Value Ref Range Status  . MRSA, PCR 12/30/2014 NEGATIVE  NEGATIVE Final  . Staphylococcus aureus 12/30/2014 NEGATIVE  NEGATIVE Final   Comment:        The Xpert SA Assay (FDA approved for NASAL specimens in patients over 20 years of age), is one component of a comprehensive surveillance program.  Test performance has been validated by [facility] for patients greater than or equal to 38 year old. It is not intended to diagnose infection nor to guide or monitor treatment.   . WBC 12/30/2014 6.8  4.0 - 10.5 K/uL Final  . RBC 12/30/2014 4.08  3.87 - 5.11 MIL/uL Final  . Hemoglobin 12/30/2014 11.6* 12.0 - 15.0 g/dL Final  . HCT 12/30/2014 36.5  36.0 - 46.0 % Final  . MCV 12/30/2014 89.5  78.0 - 100.0 fL Final  . MCH 12/30/2014 28.4  26.0 - 34.0 pg Final  . MCHC 12/30/2014 31.8  30.0 - 36.0 g/dL Final  . RDW 12/30/2014 12.6  11.5 - 15.5 % Final  . Platelets 12/30/2014 262  150 - 400 K/uL Final  . Neutrophils Relative % 12/30/2014 60  43 - 77 % Final  . Neutro Abs 12/30/2014 4.1  1.7 - 7.7 K/uL Final  . Lymphocytes Relative 12/30/2014 23  12 - 46 % Final  . Lymphs Abs 12/30/2014 1.5  0.7 - 4.0 K/uL Final  . Monocytes Relative 12/30/2014 10  3 - 12 % Final  . Monocytes Absolute 12/30/2014 0.6  0.1 - 1.0 K/uL Final  . Eosinophils Relative 12/30/2014 6* 0 - 5 % Final  . Eosinophils Absolute 12/30/2014 0.4  0.0 - 0.7 K/uL Final  . Basophils Relative 12/30/2014 1  0 - 1 % Final  . Basophils Absolute 12/30/2014 0.0  0.0 - 0.1 K/uL Final  . Sodium 12/30/2014 141  135 - 145 mmol/L Final  . Potassium 12/30/2014 4.3  3.5 - 5.1 mmol/L Final  . Chloride 12/30/2014 104  101 - 111 mmol/L Final  . CO2 12/30/2014 29  22 - 32 mmol/L Final  . Glucose, Bld 12/30/2014 104* 65 - 99 mg/dL Final  . BUN 12/30/2014 17  6 - 20 mg/dL Final  . Creatinine, Ser 12/30/2014 0.85  0.44 - 1.00 mg/dL Final  . Calcium  12/30/2014 9.6  8.9 - 10.3 mg/dL Final  . Total  Protein 12/30/2014 7.6  6.5 - 8.1 g/dL Final  . Albumin 12/30/2014 3.8  3.5 - 5.0 g/dL Final  . AST 12/30/2014 18  15 - 41 U/L Final  . ALT 12/30/2014 12* 14 - 54 U/L Final  . Alkaline Phosphatase 12/30/2014 79  38 - 126 U/L Final  . Total Bilirubin 12/30/2014 0.4  0.3 - 1.2 mg/dL Final  . GFR calc non Af Amer 12/30/2014 58* >60 mL/min Final  . GFR calc Af Amer 12/30/2014 >60  >60 mL/min Final   Comment: (NOTE) The eGFR has been calculated using the CKD EPI equation. This calculation has not been validated in all clinical situations. eGFR's persistently <60 mL/min signify possible Chronic Kidney Disease.   . Anion gap 12/30/2014 8  5 - 15 Final  . Prothrombin Time 12/30/2014 14.1  11.6 - 15.2 seconds Final  . INR 12/30/2014 1.07  0.00 - 1.49 Final  . ABO/RH(D) 12/30/2014 AB POS   Final  . Antibody Screen 12/30/2014 NEG   Final  . Sample Expiration 12/30/2014 01/09/2015   Final  . Color, Urine 12/30/2014 YELLOW  YELLOW Final  . APPearance 12/30/2014 CLEAR  CLEAR Final  . Specific Gravity, Urine 12/30/2014 1.022  1.005 - 1.030 Final  . pH 12/30/2014 5.5  5.0 - 8.0 Final  . Glucose, UA 12/30/2014 NEGATIVE  NEGATIVE mg/dL Final  . Hgb urine dipstick 12/30/2014 NEGATIVE  NEGATIVE Final  . Bilirubin Urine 12/30/2014 NEGATIVE  NEGATIVE Final  . Ketones, ur 12/30/2014 NEGATIVE  NEGATIVE mg/dL Final  . Protein, ur 12/30/2014 NEGATIVE  NEGATIVE mg/dL Final  . Urobilinogen, UA 12/30/2014 0.2  0.0 - 1.0 mg/dL Final  . Nitrite 12/30/2014 NEGATIVE  NEGATIVE Final  . Leukocytes, UA 12/30/2014 SMALL* NEGATIVE Final  . Squamous Epithelial / LPF 12/30/2014 MANY* RARE Final  . WBC, UA 12/30/2014 0-2  <3 WBC/hpf Final  . Bacteria, UA 12/30/2014 RARE  RARE Final  . ABO/RH(D) 12/30/2014 AB POS   Final     X-Rays:Dg Pelvis Portable  01/06/2015   CLINICAL DATA:  Postop left total hip arthroplasty  EXAM: PORTABLE PELVIS 1-2 VIEWS  COMPARISON:  None.   FINDINGS: The acetabular and femoral components are well seated. No complicating features are demonstrated. The visualized bony pelvis is intact. Stable right hip prosthesis.  IMPRESSION: Well seated components of a total left hip arthroplasty. No complicating features.   Electronically Signed   By: Marijo Sanes M.D.   On: 01/06/2015 12:46   Dg C-arm 1-60 Min-no Report  01/06/2015   CLINICAL DATA: hip   C-ARM 1-60 MINUTES  Fluoroscopy was utilized by the requesting physician.  No radiographic  interpretation.     EKG: Orders placed or performed in visit on 12/15/14  . EKG 12-Lead     Hospital Course: Patient was admitted to [facility] and taken to the OR and underwent the above state procedure without complications.  Patient tolerated the procedure well and was later transferred to the recovery room and then to the orthopaedic floor for postoperative care.  They were given PO and IV analgesics for pain control following their surgery.  They were given 24 hours of postoperative antibiotics of  Anti-infectives    Start     Dose/Rate Route Frequency Ordered Stop   01/06/15 1600  ceFAZolin (ANCEF) IVPB 2 g/50 mL premix  Status:  Discontinued     2 g 100 mL/hr over 30 Minutes Intravenous Every 6 hours 01/06/15 1347 01/06/15 1426   01/06/15 1600  ceFAZolin (  ANCEF) IVPB 1 g/50 mL premix     1 g 100 mL/hr over 30 Minutes Intravenous Every 6 hours 01/06/15 1427 01/06/15 2213   01/06/15 0709  ceFAZolin (ANCEF) IVPB 2 g/50 mL premix     2 g 100 mL/hr over 30 Minutes Intravenous On call to O.R. 01/06/15 0938 01/06/15 0958     and started on DVT prophylaxis in the form of Xarelto.   PT and OT were ordered for total hip protocol.  The patient was allowed to be WBAT with therapy. Discharge planning was consulted to help with postop disposition and equipment needs.  Patient had a good night on the evening of surgery and walked 45 feet that same day.  They started to get up OOB with therapy on day  one.  Hemovac drain was pulled without difficulty.  Continued to work with therapy into day two.  Dressing was changed on day two and the incision was healing well. Patient was seen in rounds on the morning of POD 2 by Dr. Wynelle Link and was ready to go home following therapy  Discharge home with home health Diet - Cardiac diet Follow up - in 2 weeks Activity - WBAT Disposition - Home Condition Upon Discharge - Good D/C Meds - See DC Summary DVT Prophylaxis - Xarelto  Discharge Instructions    Call MD / Call 911    Complete by:  As directed   If you experience chest pain or shortness of breath, CALL 911 and be transported to the hospital emergency room.  If you develope a fever above 101 F, pus (white drainage) or increased drainage or redness at the wound, or calf pain, call your surgeon's office.     Change dressing    Complete by:  As directed   You may change your dressing dressing daily with sterile 4 x 4 inch gauze dressing and paper tape.  Do not submerge the incision under water.     Constipation Prevention    Complete by:  As directed   Drink plenty of fluids.  Prune juice may be helpful.  You may use a stool softener, such as Colace (over the counter) 100 mg twice a day.  Use MiraLax (over the counter) for constipation as needed.     Diet - low sodium heart healthy    Complete by:  As directed      Discharge instructions    Complete by:  As directed   Pick up stool softner and laxative for home use following surgery while on pain medications. Do not submerge incision under water. Please use good hand washing techniques while changing dressing each day. May shower starting three days after surgery. Please use a clean towel to pat the incision dry following showers. Continue to use ice for pain and swelling after surgery. Do not use any lotions or creams on the incision until instructed by your surgeon.  Total Hip Protocol.  Take Xarelto for two and a half more weeks, then  discontinue Xarelto. Once the patient has completed the Xarelto, they may resume the 325 mg Aspirin half tablet.  Postoperative Constipation Protocol  Constipation - defined medically as fewer than three stools per week and severe constipation as less than one stool per week.  One of the most common issues patients have following surgery is constipation.  Even if you have a regular bowel pattern at home, your normal regimen is likely to be disrupted due to multiple reasons following surgery.  Combination of anesthesia, postoperative  narcotics, change in appetite and fluid intake all can affect your bowels.  In order to avoid complications following surgery, here are some recommendations in order to help you during your recovery period.  Colace (docusate) - Pick up an over-the-counter form of Colace or another stool softener and take twice a day as long as you are requiring postoperative pain medications.  Take with a full glass of water daily.  If you experience loose stools or diarrhea, hold the colace until you stool forms back up.  If your symptoms do not get better within 1 week or if they get worse, check with your doctor.  Dulcolax (bisacodyl) - Pick up over-the-counter and take as directed by the product packaging as needed to assist with the movement of your bowels.  Take with a full glass of water.  Use this product as needed if not relieved by Colace only.   MiraLax (polyethylene glycol) - Pick up over-the-counter to have on hand.  MiraLax is a solution that will increase the amount of water in your bowels to assist with bowel movements.  Take as directed and can mix with a glass of water, juice, soda, coffee, or tea.  Take if you go more than two days without a movement. Do not use MiraLax more than once per day. Call your doctor if you are still constipated or irregular after using this medication for 7 days in a row.  If you continue to have problems with postoperative constipation,  please contact the office for further assistance and recommendations.  If you experience "the worst abdominal pain ever" or develop nausea or vomiting, please contact the office immediatly for further recommendations for treatment.     Do not sit on low chairs, stoools or toilet seats, as it may be difficult to get up from low surfaces    Complete by:  As directed      Driving restrictions    Complete by:  As directed   No driving until released by the physician.     Increase activity slowly as tolerated    Complete by:  As directed      Lifting restrictions    Complete by:  As directed   No lifting until released by the physician.     Patient may shower    Complete by:  As directed   You may shower without a dressing once there is no drainage.  Do not wash over the wound.  If drainage remains, do not shower until drainage stops.     TED hose    Complete by:  As directed   Use stockings (TED hose) for 3 weeks on both leg(s).  You may remove them at night for sleeping.     Weight bearing as tolerated    Complete by:  As directed   Laterality:  left  Extremity:  Lower            Medication List    STOP taking these medications        aspirin 325 MG EC tablet     celecoxib 200 MG capsule  Commonly known as:  CELEBREX     cholecalciferol 1000 UNITS tablet  Commonly known as:  VITAMIN D     estradiol 0.05 mg/24hr patch  Commonly known as:  CLIMARA - Dosed in mg/24 hr     HYDROcodone-acetaminophen 5-325 MG per tablet  Commonly known as:  NORCO/VICODIN     OMEGA-3 FISH OIL PO     progesterone 200  MG capsule  Commonly known as:  PROMETRIUM     vitamin B-12 500 MCG tablet  Commonly known as:  CYANOCOBALAMIN      TAKE these medications        amLODipine 2.5 MG tablet  Commonly known as:  NORVASC  Take 1 tablet (2.5 mg total) by mouth daily.     clobetasol cream 0.05 %  Commonly known as:  TEMOVATE  Apply 1 application topically 2 (two) times daily. Use sparingly.      clonazePAM 0.25 MG disintegrating tablet  Commonly known as:  KLONOPIN  Take 1 tablet (0.25 mg total) by mouth 2 (two) times daily as needed (anxiety).     fluticasone 50 MCG/ACT nasal spray  Commonly known as:  FLONASE  Place 1 spray into the nose daily.     gabapentin 100 MG capsule  Commonly known as:  NEURONTIN  Take 1 capsule (100 mg total) by mouth at bedtime.     methocarbamol 500 MG tablet  Commonly known as:  ROBAXIN  Take 1 tablet (500 mg total) by mouth every 6 (six) hours as needed for muscle spasms.     metoprolol tartrate 25 MG tablet  Commonly known as:  LOPRESSOR  TAKE 1 TABLET BY MOUTH TWICE A DAY     mirtazapine 15 MG tablet  Commonly known as:  REMERON  1 po before dinner 4-5 pm     ondansetron 4 MG tablet  Commonly known as:  ZOFRAN  TAKE 1 TABLET BY MOUTH EVERY 8 HOURS AS NEEDED     oxyCODONE 5 MG immediate release tablet  Commonly known as:  Oxy IR/ROXICODONE  Take 1 tablet (5 mg total) by mouth every 3 (three) hours as needed for moderate pain, severe pain or breakthrough pain.     rivaroxaban 10 MG Tabs tablet  Commonly known as:  XARELTO  Take 1 tablet (10 mg total) by mouth daily with breakfast. Take Xarelto for two and a half more weeks, then discontinue Xarelto. Once the patient has completed the Xarelto, they may resume the 325 mg Aspirin half tablet.     traMADol 50 MG tablet  Commonly known as:  ULTRAM  Take 1-2 tablets (50-100 mg total) by mouth every 6 (six) hours as needed (mild pain).           Follow-up Information    Follow up with [facility].   Why:  [facility]   Contact information:   [address] [address] [phone]       Follow up with [name], MD. Schedule an appointment as soon as possible for a visit on 01/19/2015.   Specialty:  Orthopedic Surgery   Why:  Call office at [phone] to setup appointment on Tuesdau 01/19/2015 with Dr. Wynelle Link.   Contact  information:   [address] [place] [address] [phone]       Signed: Arlee Muslim, PA-C Orthopaedic Surgery 01/08/2015, 8:45 AM

## 2015-01-09 DIAGNOSIS — M069 Rheumatoid arthritis, unspecified: Secondary | ICD-10-CM | POA: Diagnosis not present

## 2015-01-09 DIAGNOSIS — I10 Essential (primary) hypertension: Secondary | ICD-10-CM | POA: Diagnosis not present

## 2015-01-09 DIAGNOSIS — M810 Age-related osteoporosis without current pathological fracture: Secondary | ICD-10-CM | POA: Diagnosis not present

## 2015-01-09 DIAGNOSIS — M1990 Unspecified osteoarthritis, unspecified site: Secondary | ICD-10-CM | POA: Diagnosis not present

## 2015-01-09 DIAGNOSIS — Z471 Aftercare following joint replacement surgery: Secondary | ICD-10-CM | POA: Diagnosis not present

## 2015-01-09 DIAGNOSIS — F419 Anxiety disorder, unspecified: Secondary | ICD-10-CM | POA: Diagnosis not present

## 2015-01-12 DIAGNOSIS — I10 Essential (primary) hypertension: Secondary | ICD-10-CM | POA: Diagnosis not present

## 2015-01-12 DIAGNOSIS — Z471 Aftercare following joint replacement surgery: Secondary | ICD-10-CM | POA: Diagnosis not present

## 2015-01-12 DIAGNOSIS — M810 Age-related osteoporosis without current pathological fracture: Secondary | ICD-10-CM | POA: Diagnosis not present

## 2015-01-12 DIAGNOSIS — F419 Anxiety disorder, unspecified: Secondary | ICD-10-CM | POA: Diagnosis not present

## 2015-01-12 DIAGNOSIS — M1990 Unspecified osteoarthritis, unspecified site: Secondary | ICD-10-CM | POA: Diagnosis not present

## 2015-01-12 DIAGNOSIS — M069 Rheumatoid arthritis, unspecified: Secondary | ICD-10-CM | POA: Diagnosis not present

## 2015-01-14 ENCOUNTER — Telehealth: Payer: Self-pay | Admitting: *Deleted

## 2015-01-14 MED ORDER — CEPHALEXIN 500 MG PO CAPS: 500.0000 mg | ORAL_CAPSULE | Freq: Four times a day (QID) | ORAL | Status: DC

## 2015-01-14 NOTE — Telephone Encounter (Signed)
The nail should be trimmed. Do I need to see her? OK PO abx - emailed Thx

## 2015-01-14 NOTE — Telephone Encounter (Signed)
Pt's daughter, Lelon Frohlich calling stating Judson Roch is advising pt has a small cut on her Right 1 st toe due to a ingrown toenail. The cut is healing. However, the toe is red, painful and warm. She is requesting a abx as pt just had total hip replacement 1 wk ago and is not able to come in for OV. Please advise

## 2015-01-14 NOTE — Telephone Encounter (Signed)
Pt's daughter informed of below. She states she will call us if OV is needed.

## 2015-01-15 ENCOUNTER — Ambulatory Visit: Payer: Medicare Other | Admitting: Internal Medicine

## 2015-01-15 ENCOUNTER — Other Ambulatory Visit: Payer: Self-pay | Admitting: Internal Medicine

## 2015-01-15 DIAGNOSIS — M069 Rheumatoid arthritis, unspecified: Secondary | ICD-10-CM | POA: Diagnosis not present

## 2015-01-15 DIAGNOSIS — F419 Anxiety disorder, unspecified: Secondary | ICD-10-CM | POA: Diagnosis not present

## 2015-01-15 DIAGNOSIS — M810 Age-related osteoporosis without current pathological fracture: Secondary | ICD-10-CM | POA: Diagnosis not present

## 2015-01-15 DIAGNOSIS — I10 Essential (primary) hypertension: Secondary | ICD-10-CM | POA: Diagnosis not present

## 2015-01-15 DIAGNOSIS — Z471 Aftercare following joint replacement surgery: Secondary | ICD-10-CM | POA: Diagnosis not present

## 2015-01-15 DIAGNOSIS — M1990 Unspecified osteoarthritis, unspecified site: Secondary | ICD-10-CM | POA: Diagnosis not present

## 2015-01-17 DIAGNOSIS — I10 Essential (primary) hypertension: Secondary | ICD-10-CM | POA: Diagnosis not present

## 2015-01-17 DIAGNOSIS — R1084 Generalized abdominal pain: Secondary | ICD-10-CM | POA: Diagnosis not present

## 2015-01-17 DIAGNOSIS — R197 Diarrhea, unspecified: Secondary | ICD-10-CM | POA: Diagnosis not present

## 2015-01-18 DIAGNOSIS — M810 Age-related osteoporosis without current pathological fracture: Secondary | ICD-10-CM | POA: Diagnosis not present

## 2015-01-18 DIAGNOSIS — Z471 Aftercare following joint replacement surgery: Secondary | ICD-10-CM | POA: Diagnosis not present

## 2015-01-18 DIAGNOSIS — F419 Anxiety disorder, unspecified: Secondary | ICD-10-CM | POA: Diagnosis not present

## 2015-01-18 DIAGNOSIS — I10 Essential (primary) hypertension: Secondary | ICD-10-CM | POA: Diagnosis not present

## 2015-01-18 DIAGNOSIS — M069 Rheumatoid arthritis, unspecified: Secondary | ICD-10-CM | POA: Diagnosis not present

## 2015-01-18 DIAGNOSIS — M1990 Unspecified osteoarthritis, unspecified site: Secondary | ICD-10-CM | POA: Diagnosis not present

## 2015-01-19 DIAGNOSIS — Z471 Aftercare following joint replacement surgery: Secondary | ICD-10-CM | POA: Diagnosis not present

## 2015-01-19 DIAGNOSIS — Z96642 Presence of left artificial hip joint: Secondary | ICD-10-CM | POA: Diagnosis not present

## 2015-01-21 DIAGNOSIS — M069 Rheumatoid arthritis, unspecified: Secondary | ICD-10-CM | POA: Diagnosis not present

## 2015-01-21 DIAGNOSIS — M1990 Unspecified osteoarthritis, unspecified site: Secondary | ICD-10-CM | POA: Diagnosis not present

## 2015-01-21 DIAGNOSIS — M810 Age-related osteoporosis without current pathological fracture: Secondary | ICD-10-CM | POA: Diagnosis not present

## 2015-01-21 DIAGNOSIS — F419 Anxiety disorder, unspecified: Secondary | ICD-10-CM | POA: Diagnosis not present

## 2015-01-21 DIAGNOSIS — Z471 Aftercare following joint replacement surgery: Secondary | ICD-10-CM | POA: Diagnosis not present

## 2015-01-21 DIAGNOSIS — I10 Essential (primary) hypertension: Secondary | ICD-10-CM | POA: Diagnosis not present

## 2015-02-09 DIAGNOSIS — Z96642 Presence of left artificial hip joint: Secondary | ICD-10-CM | POA: Diagnosis not present

## 2015-02-09 DIAGNOSIS — Z471 Aftercare following joint replacement surgery: Secondary | ICD-10-CM | POA: Diagnosis not present

## 2015-02-15 ENCOUNTER — Telehealth: Payer: Self-pay | Admitting: *Deleted

## 2015-02-15 NOTE — Telephone Encounter (Signed)
Can she check with her GYN. I would wean off and stop both meds Thx

## 2015-02-15 NOTE — Telephone Encounter (Signed)
Pt's daughter wants to know if pt should continue Progesterone once q 3 months x 12 days? She currently uses estradiol patches at bedtime for night sweats, but doesn't think she still needs Progesterone. Please advise.

## 2015-02-16 NOTE — Telephone Encounter (Signed)
Pt informed of below.  

## 2015-02-25 ENCOUNTER — Other Ambulatory Visit: Payer: Self-pay | Admitting: *Deleted

## 2015-02-25 MED ORDER — ESTRADIOL 0.05 MG/24HR TD PTWK: 0.0500 mg | MEDICATED_PATCH | TRANSDERMAL | Status: DC

## 2015-02-25 MED ORDER — MIRTAZAPINE 15 MG PO TABS: ORAL_TABLET | ORAL | Status: DC

## 2015-02-25 MED ORDER — METOPROLOL TARTRATE 25 MG PO TABS: 25.0000 mg | ORAL_TABLET | Freq: Two times a day (BID) | ORAL | Status: DC

## 2015-02-25 MED ORDER — AMLODIPINE BESYLATE 2.5 MG PO TABS: 2.5000 mg | ORAL_TABLET | Freq: Every day | ORAL | Status: DC

## 2015-02-25 MED ORDER — GABAPENTIN 100 MG PO CAPS: 100.0000 mg | ORAL_CAPSULE | Freq: Every day | ORAL | Status: DC

## 2015-03-04 DIAGNOSIS — H401134 Primary open-angle glaucoma, bilateral, indeterminate stage: Secondary | ICD-10-CM | POA: Diagnosis not present

## 2015-03-09 ENCOUNTER — Ambulatory Visit: Payer: Medicare Other

## 2015-03-09 ENCOUNTER — Telehealth: Payer: Self-pay | Admitting: Internal Medicine

## 2015-03-09 NOTE — Telephone Encounter (Signed)
[facility] - Client [facility] Patient Name: Theresa Hampton DOB: 08-11-1919 Initial Comment Caller states mother has a sinus infection, should she get flu shot today Nurse Assessment Nurse: Markus Daft, RN, Sherre Poot Date/Time (Eastern Time): 03/09/2015 12:04:05 PM Confirm and document reason for call. If symptomatic, describe symptoms. ---Caller states mother has a suspected sinus infection with a lot of pain in her head which started in last 2 wks. White nasal discharge. Some dry cough last night. No fever. -- Recovering from hip replacement surgery. She is due at 1:45 pm get flu shot today, should she keep this appt? She would like her to be seen for sinus s/s. - She is not with her mother now. Her home nurse has a son that has the flu but nurse has not had any s/s. She has worn a mask when around the pt. - Patient # [phone]. RN confirmed info. with the patient. She wants to know if antibiotic can be called in? RN advised that MD wants pt's seen first. s Has the patient traveled out of the country within the last 30 days? ---Not Applicable Does the patient have any new or worsening symptoms? ---Yes Will a triage be completed? ---Yes Related visit to physician within the last 2 weeks? ---No Does the PT have any chronic conditions? (i.e. diabetes, asthma, etc.) ---Yes List chronic conditions. ---HTN, left hip replacement 01/06/2015 Guidelines Guideline Title Affirmed Question Affirmed Notes Sinus Pain or Congestion [1] Sinus congestion (pressure, fullness) AND [2] present > 10 days Final Disposition User See PCP When Office is Open (within 3 days) Markus Daft, Therapist, sports, [place] Comments Appt made for tomorrow with Dr. Alain [place] at 10:15 am. RN advised that FLU shot be canceled today d/t weaker immune system when sick. Caller verb. understanding. -- OFFICE please cancel.  [email or web address] Disagree/Comply: Comply

## 2015-03-10 ENCOUNTER — Other Ambulatory Visit (INDEPENDENT_AMBULATORY_CARE_PROVIDER_SITE_OTHER): Payer: Medicare Other

## 2015-03-10 ENCOUNTER — Encounter: Payer: Self-pay | Admitting: Internal Medicine

## 2015-03-10 ENCOUNTER — Ambulatory Visit (INDEPENDENT_AMBULATORY_CARE_PROVIDER_SITE_OTHER): Payer: Medicare Other | Admitting: Internal Medicine

## 2015-03-10 VITALS — BP 160/80 | HR 70 | Temp 97.7°F | Wt 111.0 lb

## 2015-03-10 DIAGNOSIS — Z23 Encounter for immunization: Secondary | ICD-10-CM

## 2015-03-10 DIAGNOSIS — I10 Essential (primary) hypertension: Secondary | ICD-10-CM

## 2015-03-10 DIAGNOSIS — D509 Iron deficiency anemia, unspecified: Secondary | ICD-10-CM

## 2015-03-10 DIAGNOSIS — J0100 Acute maxillary sinusitis, unspecified: Secondary | ICD-10-CM

## 2015-03-10 DIAGNOSIS — J019 Acute sinusitis, unspecified: Secondary | ICD-10-CM | POA: Insufficient documentation

## 2015-03-10 DIAGNOSIS — G44219 Episodic tension-type headache, not intractable: Secondary | ICD-10-CM | POA: Diagnosis not present

## 2015-03-10 LAB — IBC PANEL
Iron: 52 ug/dL (ref 42–145)
SATURATION RATIOS: 13.6 % — AB (ref 20.0–50.0)
Transferrin: 273 mg/dL (ref 212.0–360.0)

## 2015-03-10 LAB — CBC WITH DIFFERENTIAL/PLATELET
BASOS ABS: 0 10*3/uL (ref 0.0–0.1)
Basophils Relative: 0.1 % (ref 0.0–3.0)
EOS PCT: 3.6 % (ref 0.0–5.0)
Eosinophils Absolute: 0.3 10*3/uL (ref 0.0–0.7)
HCT: 36.8 % (ref 36.0–46.0)
Hemoglobin: 12.1 g/dL (ref 12.0–15.0)
LYMPHS ABS: 1.1 10*3/uL (ref 0.7–4.0)
Lymphocytes Relative: 12.6 % (ref 12.0–46.0)
MCHC: 32.8 g/dL (ref 30.0–36.0)
MCV: 88.9 fl (ref 78.0–100.0)
MONO ABS: 0.4 10*3/uL (ref 0.1–1.0)
Monocytes Relative: 5.1 % (ref 3.0–12.0)
NEUTROS ABS: 6.7 10*3/uL (ref 1.4–7.7)
NEUTROS PCT: 78.6 % — AB (ref 43.0–77.0)
PLATELETS: 260 10*3/uL (ref 150.0–400.0)
RBC: 4.14 Mil/uL (ref 3.87–5.11)
RDW: 14.4 % (ref 11.5–15.5)
WBC: 8.5 10*3/uL (ref 4.0–10.5)

## 2015-03-10 LAB — BASIC METABOLIC PANEL
BUN: 14 mg/dL (ref 6–23)
CALCIUM: 9.7 mg/dL (ref 8.4–10.5)
CO2: 31 meq/L (ref 19–32)
CREATININE: 0.74 mg/dL (ref 0.40–1.20)
Chloride: 101 mEq/L (ref 96–112)
GFR: 77.86 mL/min (ref >60.00)
Glucose, Bld: 125 mg/dL — ABNORMAL HIGH (ref 70–99)
Potassium: 4.2 mEq/L (ref 3.5–5.1)
Sodium: 138 mEq/L (ref 135–145)

## 2015-03-10 LAB — SEDIMENTATION RATE: SED RATE: 51 mm/h — AB (ref 0–22)

## 2015-03-10 MED ORDER — GABAPENTIN 100 MG PO CAPS: 100.0000 mg | ORAL_CAPSULE | Freq: Every day | ORAL | Status: DC

## 2015-03-10 MED ORDER — TRAMADOL HCL 50 MG PO TABS: 50.0000 mg | ORAL_TABLET | Freq: Four times a day (QID) | ORAL | Status: DC | PRN

## 2015-03-10 MED ORDER — AMOXICILLIN 500 MG PO CAPS: 500.0000 mg | ORAL_CAPSULE | Freq: Three times a day (TID) | ORAL | Status: DC

## 2015-03-10 NOTE — Progress Notes (Signed)
Pre visit review using our clinic review tool, if applicable. No additional management support is needed unless otherwise documented below in the visit note. 

## 2015-03-10 NOTE — Assessment & Plan Note (Signed)
ESR Treat HTN, sinusitis Tramadol prn

## 2015-03-10 NOTE — Assessment & Plan Note (Signed)
2016 On Lopressor Re-check BP

## 2015-03-10 NOTE — Progress Notes (Signed)
Subjective:  Patient ID: Theresa Hampton, female    DOB: 1919-05-27  Age: 81 y.o. MRN: [id number]  CC: No chief complaint on file.   HPI Theresa Hampton presents for a HA, sinus congestion, ST x 1 week. No hip pain post-THR L.  Outpatient Prescriptions Prior to Visit  Medication Sig Dispense Refill  . amLODipine (NORVASC) 2.5 MG tablet Take 1 tablet (2.5 mg total) by mouth daily. 90 tablet 3  . clobetasol cream (TEMOVATE) 7.62 % Apply 1 application topically 2 (two) times daily. Use sparingly. 30 g 2  . clonazePAM (KLONOPIN) 0.25 MG disintegrating tablet Take 1 tablet (0.25 mg total) by mouth 2 (two) times daily as needed (anxiety). 60 tablet 5  . estradiol (CLIMARA - DOSED IN MG/24 HR) 0.05 mg/24hr patch Place 1 patch (0.05 mg total) onto the skin once a week. 12 patch 3  . fluticasone (FLONASE) 50 MCG/ACT nasal spray Place 1 spray into the nose daily. 16 g 3  . metoprolol tartrate (LOPRESSOR) 25 MG tablet Take 1 tablet (25 mg total) by mouth 2 (two) times daily. 180 tablet 3  . mirtazapine (REMERON) 15 MG tablet 1 po before dinner 4-5 pm 90 tablet 1  . cephALEXin (KEFLEX) 500 MG capsule Take 1 capsule (500 mg total) by mouth 4 (four) times daily. 28 capsule 0  . gabapentin (NEURONTIN) 100 MG capsule Take 1 capsule (100 mg total) by mouth at bedtime. 90 capsule 3  . methocarbamol (ROBAXIN) 500 MG tablet Take 1 tablet (500 mg total) by mouth every 6 (six) hours as needed for muscle spasms. 80 tablet 0  . ondansetron (ZOFRAN) 4 MG tablet TAKE 1 TABLET BY MOUTH EVERY 8 HOURS AS NEEDED 20 tablet 0  . rivaroxaban (XARELTO) 10 MG TABS tablet Take 1 tablet (10 mg total) by mouth daily with breakfast. Take Xarelto for two and a half more weeks, then discontinue Xarelto. Once the patient has completed the Xarelto, they may resume the 325 mg Aspirin half tablet. 19 tablet 0  . traMADol (ULTRAM) 50 MG tablet Take 1-2 tablets (50-100 mg total) by mouth every 6 (six) hours as needed (mild pain). 60  tablet 1  . oxyCODONE (OXY IR/ROXICODONE) 5 MG immediate release tablet Take 1 tablet (5 mg total) by mouth every 3 (three) hours as needed for moderate pain, severe pain or breakthrough pain. (Patient not taking: Reported on 03/10/2015) 80 tablet 0   No facility-administered medications prior to visit.    ROS Review of Systems  Constitutional: Negative for chills, activity change, appetite change, fatigue and unexpected weight change.  HENT: Positive for postnasal drip, rhinorrhea, sinus pressure and sore throat. Negative for congestion, ear discharge, facial swelling, mouth sores and nosebleeds.   Eyes: Negative for visual disturbance.  Respiratory: Negative for cough and chest tightness.   Gastrointestinal: Negative for nausea and abdominal pain.  Genitourinary: Negative for frequency, difficulty urinating and vaginal pain.  Musculoskeletal: Positive for gait problem. Negative for back pain.  Skin: Negative for pallor and rash.  Neurological: Negative for dizziness, tremors, weakness, numbness and headaches.  Psychiatric/Behavioral: Positive for sleep disturbance. Negative for suicidal ideas and confusion. The patient is not nervous/anxious.     Objective:  BP 160/80 mmHg  Pulse 70  Temp(Src) 97.7 F (36.5 C) (Oral)  Wt 111 lb (50.349 kg)  SpO2 98%  BP Readings from Last 3 Encounters:  03/10/15 160/80  01/08/15 145/66  12/30/14 134/56    Wt Readings from Last 3 Encounters:  03/10/15  111 lb (50.349 kg)  01/06/15 113 lb 8 oz (51.483 kg)  12/30/14 113 lb 8 oz (51.483 kg)    Physical Exam  Constitutional: She appears well-developed. No distress.  HENT:  Head: Normocephalic.  Right Ear: External ear normal.  Left Ear: External ear normal.  Nose: Nose normal.  Mouth/Throat: Oropharynx is clear and moist.  Eyes: Conjunctivae are normal. Pupils are equal, round, and reactive to light. Right eye exhibits no discharge. Left eye exhibits no discharge.  Neck: Normal range of  motion. Neck supple. No JVD present. No tracheal deviation present. No thyromegaly present.  Cardiovascular: Normal rate, regular rhythm and normal heart sounds.   Pulmonary/Chest: No stridor. No respiratory distress. She has no wheezes.  Abdominal: Soft. Bowel sounds are normal. She exhibits no distension and no mass. There is no tenderness. There is no rebound and no guarding.  Musculoskeletal: She exhibits tenderness. She exhibits no edema.  Lymphadenopathy:    She has no cervical adenopathy.  Neurological: She displays normal reflexes. No cranial nerve deficit. She exhibits normal muscle tone. Coordination abnormal.  Skin: No rash noted. No erythema.  Psychiatric: She has a normal mood and affect. Her behavior is normal. Judgment and thought content normal.  Temples NT Ataxic  Lab Results  Component Value Date   WBC 8.5 03/10/2015   HGB 12.1 03/10/2015   HCT 36.8 03/10/2015   PLT 260.0 03/10/2015   GLUCOSE 125* 03/10/2015   CHOL 169 05/20/2012   TRIG 75.0 05/20/2012   HDL 66.60 05/20/2012   LDLDIRECT 125.4 05/10/2011   LDLCALC 87 05/20/2012   ALT 12* 12/30/2014   AST 18 12/30/2014   NA 138 03/10/2015   K 4.2 03/10/2015   CL 101 03/10/2015   CREATININE 0.74 03/10/2015   BUN 14 03/10/2015   CO2 31 03/10/2015   TSH 2.72 06/04/2014   INR 1.07 12/30/2014   HGBA1C 5.6 09/11/2008    No results found.  Assessment & Plan:   Diagnoses and all orders for this visit:  Episodic tension-type headache, not intractable -     Sedimentation rate; Future -     CBC with Differential/Platelet; Future -     Basic metabolic panel; Future -     IBC panel; Future  Essential hypertension -     Sedimentation rate; Future -     CBC with Differential/Platelet; Future -     Basic metabolic panel; Future -     IBC panel; Future  Acute maxillary sinusitis, recurrence not specified -     Sedimentation rate; Future -     CBC with Differential/Platelet; Future -     Basic metabolic panel;  Future -     IBC panel; Future  ANEMIA-IRON DEFICIENCY -     Sedimentation rate; Future -     CBC with Differential/Platelet; Future -     Basic metabolic panel; Future -     IBC panel; Future  Need for influenza vaccination -     Flu Vaccine QUAD 36+ mos IM  Other orders -     traMADol (ULTRAM) 50 MG tablet; Take 1 tablet (50 mg total) by mouth every 6 (six) hours as needed for severe pain (mild pain). -     gabapentin (NEURONTIN) 100 MG capsule; Take 1 capsule (100 mg total) by mouth at bedtime. -     amoxicillin (AMOXIL) 500 MG capsule; Take 1 capsule (500 mg total) by mouth 3 (three) times daily.  I have discontinued Ms. Lia's ondansetron, methocarbamol, oxyCODONE,  rivaroxaban, and cephALEXin. I have also changed her traMADol. Additionally, I am having her start on amoxicillin. Lastly, I am having her maintain her fluticasone, clobetasol cream, clonazePAM, amLODipine, metoprolol tartrate, mirtazapine, estradiol, and gabapentin.  Meds ordered this encounter  Medications  . traMADol (ULTRAM) 50 MG tablet    Sig: Take 1 tablet (50 mg total) by mouth every 6 (six) hours as needed for severe pain (mild pain).    Dispense:  60 tablet    Refill:  2  . gabapentin (NEURONTIN) 100 MG capsule    Sig: Take 1 capsule (100 mg total) by mouth at bedtime.    Dispense:  90 capsule    Refill:  3  . amoxicillin (AMOXIL) 500 MG capsule    Sig: Take 1 capsule (500 mg total) by mouth 3 (three) times daily.    Dispense:  30 capsule    Refill:  0     Follow-up: Return in about 6 weeks (around 04/21/2015) for a follow-up visit.  Walker Kehr, MD

## 2015-03-10 NOTE — Assessment & Plan Note (Signed)
CBC and TIBC

## 2015-03-10 NOTE — Assessment & Plan Note (Signed)
Amoxicillin

## 2015-03-19 DIAGNOSIS — Z471 Aftercare following joint replacement surgery: Secondary | ICD-10-CM | POA: Diagnosis not present

## 2015-03-19 DIAGNOSIS — Z96642 Presence of left artificial hip joint: Secondary | ICD-10-CM | POA: Diagnosis not present

## 2015-03-26 ENCOUNTER — Telehealth: Payer: Self-pay | Admitting: *Deleted

## 2015-03-26 NOTE — Telephone Encounter (Signed)
Pt still c/o sinusitis and sore throat after completing Amoxicillin 500 mg x 10 days. She wants to know if you think she needs more abx. Please advise.

## 2015-03-26 NOTE — Telephone Encounter (Signed)
Probably not. Let me know if worse or not better in 3-5 days Thx

## 2015-03-29 NOTE — Telephone Encounter (Signed)
Called pt no answer x's 10 rings.../lmb 

## 2015-04-16 ENCOUNTER — Ambulatory Visit (INDEPENDENT_AMBULATORY_CARE_PROVIDER_SITE_OTHER): Payer: Medicare Other | Admitting: Internal Medicine

## 2015-04-16 ENCOUNTER — Encounter: Payer: Self-pay | Admitting: Internal Medicine

## 2015-04-16 VITALS — BP 120/60 | HR 84 | Wt 115.0 lb

## 2015-04-16 DIAGNOSIS — E785 Hyperlipidemia, unspecified: Secondary | ICD-10-CM

## 2015-04-16 DIAGNOSIS — E538 Deficiency of other specified B group vitamins: Secondary | ICD-10-CM

## 2015-04-16 DIAGNOSIS — M25552 Pain in left hip: Secondary | ICD-10-CM

## 2015-04-16 DIAGNOSIS — I10 Essential (primary) hypertension: Secondary | ICD-10-CM

## 2015-04-16 DIAGNOSIS — F411 Generalized anxiety disorder: Secondary | ICD-10-CM

## 2015-04-16 DIAGNOSIS — M1612 Unilateral primary osteoarthritis, left hip: Secondary | ICD-10-CM

## 2015-04-16 MED ORDER — RANITIDINE HCL 150 MG PO TABS: 150.0000 mg | ORAL_TABLET | Freq: Every day | ORAL | Status: DC

## 2015-04-16 MED ORDER — LORATADINE 10 MG PO TABS: 10.0000 mg | ORAL_TABLET | Freq: Every day | ORAL | Status: AC

## 2015-04-16 NOTE — Progress Notes (Signed)
Pre visit review using our clinic review tool, if applicable. No additional management support is needed unless otherwise documented below in the visit note. 

## 2015-04-16 NOTE — Progress Notes (Signed)
Subjective:  Patient ID: Theresa Hampton, female    DOB: 10-22-1919  Age: 81 y.o. MRN: [id number]  CC: No chief complaint on file.   HPI MELAH SCHUPBACH presents for HTN, OA, cough f/u  Outpatient Prescriptions Prior to Visit  Medication Sig Dispense Refill  . amLODipine (NORVASC) 2.5 MG tablet Take 1 tablet (2.5 mg total) by mouth daily. 90 tablet 3  . clobetasol cream (TEMOVATE) [id number] % Apply 1 application topically 2 (two) times daily. Use sparingly. 30 g 2  . clonazePAM (KLONOPIN) 0.25 MG disintegrating tablet Take 1 tablet (0.25 mg total) by mouth 2 (two) times daily as needed (anxiety). 60 tablet 5  . estradiol (CLIMARA - DOSED IN MG/24 HR) 0.05 mg/24hr patch Place 1 patch (0.05 mg total) onto the skin once a week. 12 patch 3  . fluticasone (FLONASE) 50 MCG/ACT nasal spray Place 1 spray into the nose daily. 16 g 3  . metoprolol tartrate (LOPRESSOR) 25 MG tablet Take 1 tablet (25 mg total) by mouth 2 (two) times daily. 180 tablet 3  . mirtazapine (REMERON) 15 MG tablet 1 po before dinner 4-5 pm 90 tablet 1  . traMADol (ULTRAM) 50 MG tablet Take 1 tablet (50 mg total) by mouth every 6 (six) hours as needed for severe pain (mild pain). 60 tablet 2  . gabapentin (NEURONTIN) 100 MG capsule Take 1 capsule (100 mg total) by mouth at bedtime. 90 capsule 3  . amoxicillin (AMOXIL) 500 MG capsule Take 1 capsule (500 mg total) by mouth 3 (three) times daily. (Patient not taking: Reported on 04/16/2015) 30 capsule 0   No facility-administered medications prior to visit.    ROS Review of Systems  Constitutional: Positive for fatigue. Negative for chills, activity change, appetite change and unexpected weight change.  HENT: Negative for congestion, mouth sores and sinus pressure.   Eyes: Negative for visual disturbance.  Respiratory: Negative for cough and chest tightness.   Gastrointestinal: Negative for nausea and abdominal pain.  Genitourinary: Negative for frequency, difficulty  urinating and vaginal pain.  Musculoskeletal: Negative for back pain and gait problem.  Skin: Negative for pallor and rash.  Neurological: Negative for dizziness, tremors, weakness, numbness and headaches.  Psychiatric/Behavioral: Negative for confusion and sleep disturbance.    Objective:  BP 120/60 mmHg  Pulse 84  Wt 115 lb (52.164 kg)  SpO2 93%  BP Readings from Last 3 Encounters:  04/16/15 120/60  03/10/15 160/80  01/08/15 145/66    Wt Readings from Last 3 Encounters:  04/16/15 115 lb (52.164 kg)  03/10/15 111 lb (50.349 kg)  01/06/15 113 lb 8 oz (51.483 kg)    Physical Exam  Constitutional: She appears well-developed. No distress.  HENT:  Head: Normocephalic.  Right Ear: External ear normal.  Left Ear: External ear normal.  Nose: Nose normal.  Mouth/Throat: Oropharynx is clear and moist.  Eyes: Conjunctivae are normal. Pupils are equal, round, and reactive to light. Right eye exhibits no discharge. Left eye exhibits no discharge.  Neck: Normal range of motion. Neck supple. No JVD present. No tracheal deviation present. No thyromegaly present.  Cardiovascular: Normal rate, regular rhythm and normal heart sounds.   Pulmonary/Chest: No stridor. No respiratory distress. She has no wheezes.  Abdominal: Soft. Bowel sounds are normal. She exhibits no distension and no mass. There is no tenderness. There is no rebound and no guarding.  Musculoskeletal: She exhibits no edema or tenderness.  Lymphadenopathy:    She has no cervical adenopathy.  Neurological: She  displays normal reflexes. No cranial nerve deficit. She exhibits normal muscle tone. Coordination abnormal.  Skin: No rash noted. No erythema.  Psychiatric: She has a normal mood and affect. Her behavior is normal. Judgment and thought content normal.  limp a little  Lab Results  Component Value Date   WBC 8.5 03/10/2015   HGB 12.1 03/10/2015   HCT 36.8 03/10/2015   PLT 260.0 03/10/2015   GLUCOSE 125* 03/10/2015     CHOL 169 05/20/2012   TRIG 75.0 05/20/2012   HDL 66.60 05/20/2012   LDLDIRECT 125.4 05/10/2011   LDLCALC 87 05/20/2012   ALT 12* 12/30/2014   AST 18 12/30/2014   NA 138 03/10/2015   K 4.2 03/10/2015   CL 101 03/10/2015   CREATININE 0.74 03/10/2015   BUN 14 03/10/2015   CO2 31 03/10/2015   TSH 2.72 06/04/2014   INR 1.07 12/30/2014   HGBA1C 5.6 09/11/2008    No results found.  Assessment & Plan:   There are no diagnoses linked to this encounter. I have discontinued Ms. Genis's gabapentin, amoxicillin, and Fish Oil. I am also having her start on ranitidine and loratadine. Additionally, I am having her maintain her fluticasone, clobetasol cream, clonazePAM, amLODipine, metoprolol tartrate, mirtazapine, estradiol, traMADol, bimatoprost, cyanocobalamin, and Cholecalciferol.  Meds ordered this encounter  Medications  . bimatoprost (LUMIGAN) 0.03 % ophthalmic solution    Sig: 1 drop at bedtime.  . cyanocobalamin 500 MCG tablet    Sig: Take 500 mcg by mouth daily.  . Cholecalciferol 2000 UNITS TABS    Sig: Take 2 each by mouth at bedtime.  Marland Kitchen DISCONTD: Omega-3 Fatty Acids (FISH OIL) 1000 MG CAPS    Sig: Take 1 capsule by mouth at bedtime.  . ranitidine (ZANTAC) 150 MG tablet    Sig: Take 1 tablet (150 mg total) by mouth at bedtime.    Dispense:  30 tablet    Refill:  5  . loratadine (CLARITIN) 10 MG tablet    Sig: Take 1 tablet (10 mg total) by mouth daily.    Dispense:  100 tablet    Refill:  2     Follow-up: Return in about 3 months (around 07/15/2015) for a follow-up visit.  Walker Kehr, MD

## 2015-04-16 NOTE — Patient Instructions (Signed)
Stop fish oil

## 2015-04-17 ENCOUNTER — Encounter: Payer: Self-pay | Admitting: Internal Medicine

## 2015-04-17 NOTE — Assessment & Plan Note (Signed)
Better  Tramadol prn

## 2015-04-17 NOTE — Assessment & Plan Note (Signed)
On Lopressor and Norvasc - check meds at home

## 2015-04-17 NOTE — Assessment & Plan Note (Signed)
Grief related On Remeron

## 2015-04-17 NOTE — Assessment & Plan Note (Addendum)
  On diet  

## 2015-04-17 NOTE — Assessment & Plan Note (Signed)
Recovering well

## 2015-04-17 NOTE — Assessment & Plan Note (Signed)
On B12 

## 2015-05-04 DIAGNOSIS — Z961 Presence of intraocular lens: Secondary | ICD-10-CM | POA: Diagnosis not present

## 2015-05-04 DIAGNOSIS — H401134 Primary open-angle glaucoma, bilateral, indeterminate stage: Secondary | ICD-10-CM | POA: Diagnosis not present

## 2015-05-04 DIAGNOSIS — H04123 Dry eye syndrome of bilateral lacrimal glands: Secondary | ICD-10-CM | POA: Diagnosis not present

## 2015-05-28 ENCOUNTER — Other Ambulatory Visit: Payer: Self-pay | Admitting: Internal Medicine

## 2015-06-02 ENCOUNTER — Telehealth: Payer: Self-pay | Admitting: *Deleted

## 2015-06-02 DIAGNOSIS — H9113 Presbycusis, bilateral: Secondary | ICD-10-CM

## 2015-06-02 NOTE — Telephone Encounter (Signed)
Pt is requesting a referral for a hearing test. She is requesting a copy of the referral to be mailed to her.

## 2015-06-03 NOTE — Telephone Encounter (Signed)
OK. Done. Pls print Thx

## 2015-06-04 NOTE — Telephone Encounter (Signed)
Notified pt md ok referral.../lmb

## 2015-06-08 DIAGNOSIS — H401132 Primary open-angle glaucoma, bilateral, moderate stage: Secondary | ICD-10-CM | POA: Diagnosis not present

## 2015-06-08 DIAGNOSIS — H04123 Dry eye syndrome of bilateral lacrimal glands: Secondary | ICD-10-CM | POA: Diagnosis not present

## 2015-06-09 ENCOUNTER — Telehealth: Payer: Self-pay | Admitting: *Deleted

## 2015-06-09 NOTE — Telephone Encounter (Signed)
Received fax pt requesting refill on her Tramadol 50 mg.../lmb

## 2015-06-09 NOTE — Telephone Encounter (Signed)
OK to fill this prescription with additional refills x3 Thank you!  

## 2015-06-10 MED ORDER — TRAMADOL HCL 50 MG PO TABS: 50.0000 mg | ORAL_TABLET | Freq: Four times a day (QID) | ORAL | Status: DC | PRN

## 2015-06-10 NOTE — Telephone Encounter (Signed)
Called refill into [facility] left on pharmacy vm.../.lmb 

## 2015-06-30 ENCOUNTER — Telehealth: Payer: Self-pay | Admitting: *Deleted

## 2015-06-30 NOTE — Telephone Encounter (Signed)
Per pt's request: I mailed copy of audiology referral to pt so she can have a hearing eval.

## 2015-07-13 ENCOUNTER — Ambulatory Visit: Payer: Medicare Other | Admitting: Internal Medicine

## 2015-07-14 ENCOUNTER — Ambulatory Visit: Payer: Medicare Other | Admitting: Internal Medicine

## 2015-07-21 ENCOUNTER — Other Ambulatory Visit: Payer: Self-pay | Admitting: *Deleted

## 2015-07-21 NOTE — Telephone Encounter (Signed)
Rf req for Clonazepam 0.25 mg 1 bid. Ok to RF?

## 2015-07-22 DIAGNOSIS — H903 Sensorineural hearing loss, bilateral: Secondary | ICD-10-CM | POA: Diagnosis not present

## 2015-07-23 MED ORDER — CLONAZEPAM 0.25 MG PO TBDP: 0.2500 mg | ORAL_TABLET | Freq: Two times a day (BID) | ORAL | Status: DC | PRN

## 2015-07-23 NOTE — Telephone Encounter (Signed)
OK to fill this prescription with additional refills x3 Thank you!  

## 2015-07-23 NOTE — Telephone Encounter (Signed)
Called [facility] had to leave refill on pharmacy vm left md authorization...[name]

## 2015-07-29 ENCOUNTER — Other Ambulatory Visit: Payer: Self-pay | Admitting: Internal Medicine

## 2015-07-29 ENCOUNTER — Encounter: Payer: Self-pay | Admitting: Internal Medicine

## 2015-07-29 ENCOUNTER — Ambulatory Visit (INDEPENDENT_AMBULATORY_CARE_PROVIDER_SITE_OTHER): Payer: Medicare Other | Admitting: Internal Medicine

## 2015-07-29 ENCOUNTER — Other Ambulatory Visit (INDEPENDENT_AMBULATORY_CARE_PROVIDER_SITE_OTHER): Payer: Medicare Other

## 2015-07-29 VITALS — BP 134/80 | HR 94 | Temp 97.5°F | Ht 65.0 in | Wt 116.0 lb

## 2015-07-29 DIAGNOSIS — D509 Iron deficiency anemia, unspecified: Secondary | ICD-10-CM

## 2015-07-29 DIAGNOSIS — R5383 Other fatigue: Secondary | ICD-10-CM | POA: Insufficient documentation

## 2015-07-29 DIAGNOSIS — I10 Essential (primary) hypertension: Secondary | ICD-10-CM

## 2015-07-29 DIAGNOSIS — F411 Generalized anxiety disorder: Secondary | ICD-10-CM | POA: Diagnosis not present

## 2015-07-29 LAB — CBC WITH DIFFERENTIAL/PLATELET
BASOS ABS: 0 10*3/uL (ref 0.0–0.1)
BASOS PCT: 0.4 % (ref 0.0–3.0)
EOS ABS: 0.4 10*3/uL (ref 0.0–0.7)
Eosinophils Relative: 4.6 % (ref 0.0–5.0)
HCT: 37.7 % (ref 36.0–46.0)
HEMOGLOBIN: 12.6 g/dL (ref 12.0–15.0)
Lymphocytes Relative: 19.6 % (ref 12.0–46.0)
Lymphs Abs: 1.7 10*3/uL (ref 0.7–4.0)
MCHC: 33.4 g/dL (ref 30.0–36.0)
MCV: 84.9 fl (ref 78.0–100.0)
MONO ABS: 0.7 10*3/uL (ref 0.1–1.0)
Monocytes Relative: 8.8 % (ref 3.0–12.0)
Neutro Abs: 5.6 10*3/uL (ref 1.4–7.7)
Neutrophils Relative %: 66.6 % (ref 43.0–77.0)
PLATELETS: 275 10*3/uL (ref 150.0–400.0)
RBC: 4.44 Mil/uL (ref 3.87–5.11)
RDW: 13.8 % (ref 11.5–15.5)
WBC: 8.4 10*3/uL (ref 4.0–10.5)

## 2015-07-29 LAB — URINALYSIS, ROUTINE W REFLEX MICROSCOPIC
Hgb urine dipstick: NEGATIVE
Ketones, ur: NEGATIVE
Nitrite: NEGATIVE
PH: 5 (ref 5.0–8.0)
Specific Gravity, Urine: 1.025 (ref 1.000–1.030)
TOTAL PROTEIN, URINE-UPE24: NEGATIVE
UROBILINOGEN UA: 0.2 (ref 0.0–1.0)
Urine Glucose: NEGATIVE

## 2015-07-29 LAB — BASIC METABOLIC PANEL
BUN: 16 mg/dL (ref 6–23)
CALCIUM: 9.6 mg/dL (ref 8.4–10.5)
CO2: 29 meq/L (ref 19–32)
Chloride: 103 mEq/L (ref 96–112)
Creatinine, Ser: 0.82 mg/dL (ref 0.40–1.20)
GFR: 69.11 mL/min (ref >60.00)
GLUCOSE: 100 mg/dL — AB (ref 70–99)
POTASSIUM: 4.1 meq/L (ref 3.5–5.1)
Sodium: 139 mEq/L (ref 135–145)

## 2015-07-29 LAB — TSH: TSH: 2.4 u[IU]/mL (ref 0.35–4.50)

## 2015-07-29 MED ORDER — CIPROFLOXACIN HCL 250 MG PO TABS: 250.0000 mg | ORAL_TABLET | Freq: Two times a day (BID) | ORAL | Status: DC

## 2015-07-29 MED ORDER — CLONAZEPAM 0.25 MG PO TBDP: 0.2500 mg | ORAL_TABLET | Freq: Two times a day (BID) | ORAL | Status: DC | PRN

## 2015-07-29 NOTE — Progress Notes (Signed)
Subjective:  Patient ID: Theresa Hampton, female    DOB: 1919-12-08  Age: 82 y.o. MRN: [id number]  CC: Follow-up and Fatigue   HPI Theresa Hampton presents for HTN, OA, anxiety f/u. C/o fatigue x 2 days.   Outpatient Prescriptions Prior to Visit  Medication Sig Dispense Refill  . amLODipine (NORVASC) 2.5 MG tablet Take 1 tablet (2.5 mg total) by mouth daily. 90 tablet 3  . bimatoprost (LUMIGAN) 0.03 % ophthalmic solution 1 drop at bedtime.    . Cholecalciferol 2000 UNITS TABS Take 2 each by mouth at bedtime.    . clobetasol cream (TEMOVATE) [id number] % Apply 1 application topically 2 (two) times daily. Use sparingly. 30 g 2  . cyanocobalamin 500 MCG tablet Take 500 mcg by mouth daily.    Marland Kitchen estradiol (CLIMARA - DOSED IN MG/24 HR) 0.05 mg/24hr patch Place 1 patch (0.05 mg total) onto the skin once a week. 12 patch 3  . fluticasone (FLONASE) 50 MCG/ACT nasal spray Place 1 spray into the nose daily. 16 g 3  . loratadine (CLARITIN) 10 MG tablet Take 1 tablet (10 mg total) by mouth daily. 100 tablet 2  . metoprolol tartrate (LOPRESSOR) 25 MG tablet Take 1 tablet (25 mg total) by mouth 2 (two) times daily. 180 tablet 3  . mirtazapine (REMERON) 15 MG tablet TAKE 1 TABLET BY MOUTH BEFORE DINNER AT 4-5 PM 90 tablet 0  . ranitidine (ZANTAC) 150 MG tablet Take 1 tablet (150 mg total) by mouth at bedtime. 30 tablet 5  . traMADol (ULTRAM) 50 MG tablet Take 1 tablet (50 mg total) by mouth every 6 (six) hours as needed for severe pain (mild pain). 60 tablet 3  . clonazePAM (KLONOPIN) 0.25 MG disintegrating tablet Take 1 tablet (0.25 mg total) by mouth 2 (two) times daily as needed (anxiety). 60 tablet 3   No facility-administered medications prior to visit.    ROS Review of Systems  Constitutional: Positive for fatigue. Negative for chills, activity change, appetite change and unexpected weight change.  HENT: Negative for congestion, mouth sores and sinus pressure.   Eyes: Negative for visual  disturbance.  Respiratory: Negative for cough and chest tightness.   Gastrointestinal: Negative for nausea and abdominal pain.  Genitourinary: Negative for frequency, difficulty urinating and vaginal pain.  Musculoskeletal: Positive for gait problem. Negative for back pain.  Skin: Negative for pallor and rash.  Neurological: Negative for dizziness, tremors, weakness, light-headedness, numbness and headaches.  Psychiatric/Behavioral: Negative for confusion and sleep disturbance.    Objective:  BP 134/80 mmHg  Pulse 94  Temp(Src) 97.5 F (36.4 C) (Oral)  Ht 5\' 5"  (1.651 m)  Wt 116 lb (52.617 kg)  BMI 19.30 kg/m2  SpO2 97%  BP Readings from Last 3 Encounters:  07/29/15 134/80  04/16/15 120/60  03/10/15 160/80    Wt Readings from Last 3 Encounters:  07/29/15 116 lb (52.617 kg)  04/16/15 115 lb (52.164 kg)  03/10/15 111 lb (50.349 kg)    Physical Exam  Constitutional: She appears well-developed. No distress.  HENT:  Head: Normocephalic.  Right Ear: External ear normal.  Left Ear: External ear normal.  Nose: Nose normal.  Mouth/Throat: Oropharynx is clear and moist.  Eyes: Conjunctivae are normal. Pupils are equal, round, and reactive to light. Right eye exhibits no discharge. Left eye exhibits no discharge.  Neck: Normal range of motion. Neck supple. No JVD present. No tracheal deviation present. No thyromegaly present.  Cardiovascular: Normal rate, regular rhythm and normal heart  sounds.   Pulmonary/Chest: No stridor. No respiratory distress. She has no wheezes.  Abdominal: Soft. Bowel sounds are normal. She exhibits no distension and no mass. There is no tenderness. There is no rebound and no guarding.  Musculoskeletal: She exhibits no edema or tenderness.  Lymphadenopathy:    She has no cervical adenopathy.  Neurological: She displays normal reflexes. No cranial nerve deficit. She exhibits normal muscle tone. Coordination abnormal.  Skin: No rash noted. No erythema.    Psychiatric: She has a normal mood and affect. Her behavior is normal. Judgment and thought content normal.  No pronator drift Good balance for age A/o/c  Lab Results  Component Value Date   WBC 8.5 03/10/2015   HGB 12.1 03/10/2015   HCT 36.8 03/10/2015   PLT 260.0 03/10/2015   GLUCOSE 125* 03/10/2015   CHOL 169 05/20/2012   TRIG 75.0 05/20/2012   HDL 66.60 05/20/2012   LDLDIRECT 125.4 05/10/2011   LDLCALC 87 05/20/2012   ALT 12* 12/30/2014   AST 18 12/30/2014   NA 138 03/10/2015   K 4.2 03/10/2015   CL 101 03/10/2015   CREATININE 0.74 03/10/2015   BUN 14 03/10/2015   CO2 31 03/10/2015   TSH 2.72 06/04/2014   INR 1.07 12/30/2014   HGBA1C 5.6 09/11/2008    No results found.  Assessment & Plan:   Theresa Hampton was seen today for follow-up and fatigue.  Diagnoses and all orders for this visit:  Other fatigue -     CBC with Differential/Platelet; Future -     Basic metabolic panel; Future -     TSH; Future -     Urinalysis; Future  Essential hypertension -     CBC with Differential/Platelet; Future -     Urinalysis; Future  Generalized anxiety disorder  ANEMIA-IRON DEFICIENCY -     CBC with Differential/Platelet; Future  Other orders -     clonazePAM (KLONOPIN) 0.25 MG disintegrating tablet; Take 1 tablet (0.25 mg total) by mouth 2 (two) times daily as needed (anxiety).  I am having Theresa Hampton maintain her fluticasone, clobetasol cream, amLODipine, metoprolol tartrate, estradiol, bimatoprost, cyanocobalamin, Cholecalciferol, ranitidine, loratadine, mirtazapine, traMADol, and clonazePAM.  Meds ordered this encounter  Medications  . clonazePAM (KLONOPIN) 0.25 MG disintegrating tablet    Sig: Take 1 tablet (0.25 mg total) by mouth 2 (two) times daily as needed (anxiety).    Dispense:  60 tablet    Refill:  3     Follow-up: Return in about 3 months (around 10/29/2015) for a follow-up visit.  Walker Kehr, MD

## 2015-07-29 NOTE — Assessment & Plan Note (Signed)
On Lopressor and Norvasc 

## 2015-07-29 NOTE — Assessment & Plan Note (Signed)
3/17 acute ?etiology R/o UTI

## 2015-07-29 NOTE — Progress Notes (Signed)
Pre visit review using our clinic review tool, if applicable. No additional management support is needed unless otherwise documented below in the visit note. 

## 2015-07-29 NOTE — Assessment & Plan Note (Signed)
CBC

## 2015-07-29 NOTE — Assessment & Plan Note (Signed)
On Remeron Clonazepam prm

## 2015-08-05 DIAGNOSIS — H401132 Primary open-angle glaucoma, bilateral, moderate stage: Secondary | ICD-10-CM | POA: Diagnosis not present

## 2015-08-05 DIAGNOSIS — H04123 Dry eye syndrome of bilateral lacrimal glands: Secondary | ICD-10-CM | POA: Diagnosis not present

## 2015-08-05 DIAGNOSIS — Z961 Presence of intraocular lens: Secondary | ICD-10-CM | POA: Diagnosis not present

## 2015-09-08 ENCOUNTER — Other Ambulatory Visit: Payer: Self-pay | Admitting: *Deleted

## 2015-09-08 MED ORDER — ESTRADIOL 0.05 MG/24HR TD PTWK: 0.0500 mg | MEDICATED_PATCH | TRANSDERMAL | Status: DC

## 2015-09-21 ENCOUNTER — Other Ambulatory Visit: Payer: Self-pay | Admitting: Internal Medicine

## 2015-09-22 NOTE — Telephone Encounter (Signed)
Called refill into [facility] had to leave msg on pharmacy vm,,,/lmb

## 2015-11-03 ENCOUNTER — Ambulatory Visit (INDEPENDENT_AMBULATORY_CARE_PROVIDER_SITE_OTHER): Payer: Medicare Other | Admitting: Internal Medicine

## 2015-11-03 ENCOUNTER — Encounter: Payer: Self-pay | Admitting: Internal Medicine

## 2015-11-03 ENCOUNTER — Other Ambulatory Visit (INDEPENDENT_AMBULATORY_CARE_PROVIDER_SITE_OTHER): Payer: Medicare Other

## 2015-11-03 VITALS — BP 130/84 | HR 99 | Wt 120.0 lb

## 2015-11-03 DIAGNOSIS — I10 Essential (primary) hypertension: Secondary | ICD-10-CM

## 2015-11-03 DIAGNOSIS — D509 Iron deficiency anemia, unspecified: Secondary | ICD-10-CM | POA: Diagnosis not present

## 2015-11-03 DIAGNOSIS — E538 Deficiency of other specified B group vitamins: Secondary | ICD-10-CM | POA: Diagnosis not present

## 2015-11-03 LAB — BASIC METABOLIC PANEL
BUN: 17 mg/dL (ref 6–23)
CO2: 28 mEq/L (ref 19–32)
Calcium: 9.4 mg/dL (ref 8.4–10.5)
Chloride: 105 mEq/L (ref 96–112)
Creatinine, Ser: 0.8 mg/dL (ref 0.40–1.20)
GFR: 71.06 mL/min (ref >60.00)
Glucose, Bld: 109 mg/dL — ABNORMAL HIGH (ref 70–99)
POTASSIUM: 3.8 meq/L (ref 3.5–5.1)
Sodium: 139 mEq/L (ref 135–145)

## 2015-11-03 LAB — CBC WITH DIFFERENTIAL/PLATELET
BASOS ABS: 0 10*3/uL (ref 0.0–0.1)
BASOS PCT: 0.6 % (ref 0.0–3.0)
EOS ABS: 0.4 10*3/uL (ref 0.0–0.7)
Eosinophils Relative: 6.3 % — ABNORMAL HIGH (ref 0.0–5.0)
HEMATOCRIT: 35.6 % — AB (ref 36.0–46.0)
Hemoglobin: 11.9 g/dL — ABNORMAL LOW (ref 12.0–15.0)
LYMPHS ABS: 1.1 10*3/uL (ref 0.7–4.0)
LYMPHS PCT: 15.7 % (ref 12.0–46.0)
MCHC: 33.3 g/dL (ref 30.0–36.0)
MCV: 85.3 fl (ref 78.0–100.0)
MONO ABS: 0.7 10*3/uL (ref 0.1–1.0)
Monocytes Relative: 10.4 % (ref 3.0–12.0)
NEUTROS ABS: 4.7 10*3/uL (ref 1.4–7.7)
NEUTROS PCT: 67 % (ref 43.0–77.0)
PLATELETS: 236 10*3/uL (ref 150.0–400.0)
RBC: 4.17 Mil/uL (ref 3.87–5.11)
RDW: 12.8 % (ref 11.5–15.5)
WBC: 7 10*3/uL (ref 4.0–10.5)

## 2015-11-03 NOTE — Progress Notes (Signed)
Subjective:  Patient ID: Theresa Hampton, female    DOB: May 22, 1919  Age: 82 y.o. MRN: [id number]  CC: No chief complaint on file.   HPI Theresa Hampton presents for HTN, anxiety  Outpatient Prescriptions Prior to Visit  Medication Sig Dispense Refill  . amLODipine (NORVASC) 2.5 MG tablet Take 1 tablet (2.5 mg total) by mouth daily. 90 tablet 3  . bimatoprost (LUMIGAN) 0.03 % ophthalmic solution 1 drop at bedtime.    . Cholecalciferol 2000 UNITS TABS Take 2 each by mouth at bedtime.    . clobetasol cream (TEMOVATE) AB-123456789 % Apply 1 application topically 2 (two) times daily. Use sparingly. 30 g 2  . clonazePAM (KLONOPIN) 0.25 MG disintegrating tablet Take 1 tablet (0.25 mg total) by mouth 2 (two) times daily as needed (anxiety). 60 tablet 3  . cyanocobalamin 500 MCG tablet Take 500 mcg by mouth daily.    Marland Kitchen estradiol (CLIMARA - DOSED IN MG/24 HR) 0.05 mg/24hr patch Place 1 patch (0.05 mg total) onto the skin once a week. 12 patch 1  . fluticasone (FLONASE) 50 MCG/ACT nasal spray Place 1 spray into the nose daily. 16 g 3  . loratadine (CLARITIN) 10 MG tablet Take 1 tablet (10 mg total) by mouth daily. 100 tablet 2  . metoprolol tartrate (LOPRESSOR) 25 MG tablet Take 1 tablet (25 mg total) by mouth 2 (two) times daily. 180 tablet 3  . mirtazapine (REMERON) 15 MG tablet TAKE 1 TABLET BY MOUTH BEFORE DINNER AT 4-5 PM 90 tablet 0  . traMADol (ULTRAM) 50 MG tablet TAKE 1 TABLET BY MOUTH EVERY 6 HOURS AS NEEDED FOR PAIN 60 tablet 2  . ranitidine (ZANTAC) 150 MG tablet Take 1 tablet (150 mg total) by mouth at bedtime. (Patient not taking: Reported on 11/03/2015) 30 tablet 5  . ciprofloxacin (CIPRO) 250 MG tablet Take 1 tablet (250 mg total) by mouth 2 (two) times daily. (Patient not taking: Reported on 11/03/2015) 8 tablet 0   No facility-administered medications prior to visit.    ROS Review of Systems  Constitutional: Negative for chills, activity change, appetite change, fatigue and  unexpected weight change.  HENT: Negative for congestion, mouth sores and sinus pressure.   Eyes: Negative for visual disturbance.  Respiratory: Negative for cough and chest tightness.   Gastrointestinal: Negative for nausea and abdominal pain.  Genitourinary: Negative for frequency, difficulty urinating and vaginal pain.  Musculoskeletal: Positive for arthralgias and gait problem. Negative for back pain.  Skin: Negative for pallor and rash.  Neurological: Negative for dizziness, tremors, weakness, numbness and headaches.  Psychiatric/Behavioral: Negative for confusion and sleep disturbance. The patient is nervous/anxious.     Objective:  BP 130/84 mmHg  Pulse 99  Wt 120 lb (54.432 kg)  SpO2 96%  BP Readings from Last 3 Encounters:  11/03/15 130/84  07/29/15 134/80  04/16/15 120/60    Wt Readings from Last 3 Encounters:  11/03/15 120 lb (54.432 kg)  07/29/15 116 lb (52.617 kg)  04/16/15 115 lb (52.164 kg)    Physical Exam  Constitutional: She appears well-developed. No distress.  HENT:  Head: Normocephalic.  Right Ear: External ear normal.  Left Ear: External ear normal.  Nose: Nose normal.  Mouth/Throat: Oropharynx is clear and moist.  Eyes: Conjunctivae are normal. Pupils are equal, round, and reactive to light. Right eye exhibits no discharge. Left eye exhibits no discharge.  Neck: Normal range of motion. Neck supple. No JVD present. No tracheal deviation present. No thyromegaly present.  Cardiovascular: Normal rate, regular rhythm and normal heart sounds.   Pulmonary/Chest: No stridor. No respiratory distress. She has no wheezes.  Abdominal: Soft. Bowel sounds are normal. She exhibits no distension and no mass. There is no tenderness. There is no rebound and no guarding.  Musculoskeletal: She exhibits tenderness. She exhibits no edema.  Lymphadenopathy:    She has no cervical adenopathy.  Neurological: She displays normal reflexes. No cranial nerve deficit. She  exhibits normal muscle tone. Coordination abnormal.  Skin: No rash noted. No erythema.  Psychiatric: She has a normal mood and affect. Her behavior is normal. Judgment and thought content normal.    Lab Results  Component Value Date   WBC 8.4 07/29/2015   HGB 12.6 07/29/2015   HCT 37.7 07/29/2015   PLT 275.0 07/29/2015   GLUCOSE 100* 07/29/2015   CHOL 169 05/20/2012   TRIG 75.0 05/20/2012   HDL 66.60 05/20/2012   LDLDIRECT 125.4 05/10/2011   LDLCALC 87 05/20/2012   ALT 12* 12/30/2014   AST 18 12/30/2014   NA 139 07/29/2015   K 4.1 07/29/2015   CL 103 07/29/2015   CREATININE 0.82 07/29/2015   BUN 16 07/29/2015   CO2 29 07/29/2015   TSH 2.40 07/29/2015   INR 1.07 12/30/2014   HGBA1C 5.6 09/11/2008    No results found.  Assessment & Plan:   There are no diagnoses linked to this encounter. I have discontinued Ms. Quijas's ciprofloxacin. I am also having her maintain her fluticasone, clobetasol cream, amLODipine, metoprolol tartrate, bimatoprost, cyanocobalamin, Cholecalciferol, ranitidine, loratadine, mirtazapine, clonazePAM, estradiol, and traMADol.  No orders of the defined types were placed in this encounter.     Follow-up: No Follow-up on file.  Walker Kehr, MD

## 2015-11-03 NOTE — Assessment & Plan Note (Signed)
On B12 

## 2015-11-03 NOTE — Assessment & Plan Note (Signed)
Resolved

## 2015-11-03 NOTE — Assessment & Plan Note (Signed)
On Lopressor and Norvasc Labs

## 2015-11-03 NOTE — Progress Notes (Signed)
Pre visit review using our clinic review tool, if applicable. No additional management support is needed unless otherwise documented below in the visit note. 

## 2015-11-30 ENCOUNTER — Other Ambulatory Visit: Payer: Self-pay | Admitting: *Deleted

## 2015-11-30 NOTE — Telephone Encounter (Signed)
Pt is requesting Rf on Tramadol. Ok to Rf?

## 2015-12-01 MED ORDER — TRAMADOL HCL 50 MG PO TABS: 50.0000 mg | ORAL_TABLET | Freq: Four times a day (QID) | ORAL | 2 refills | Status: DC | PRN

## 2015-12-01 NOTE — Telephone Encounter (Signed)
Ok to Rf per PCP.

## 2015-12-01 NOTE — Telephone Encounter (Signed)
Done. See meds. Pt informed  

## 2015-12-08 DIAGNOSIS — Z961 Presence of intraocular lens: Secondary | ICD-10-CM | POA: Diagnosis not present

## 2015-12-08 DIAGNOSIS — H401132 Primary open-angle glaucoma, bilateral, moderate stage: Secondary | ICD-10-CM | POA: Diagnosis not present

## 2015-12-08 DIAGNOSIS — H04123 Dry eye syndrome of bilateral lacrimal glands: Secondary | ICD-10-CM | POA: Diagnosis not present

## 2016-01-19 ENCOUNTER — Telehealth: Payer: Self-pay | Admitting: *Deleted

## 2016-01-19 NOTE — Telephone Encounter (Signed)
OK to fill this prescription with additional refills x2 Thank you!  

## 2016-01-19 NOTE — Telephone Encounter (Signed)
Rec'd fax pt requesting refill on Tramadol 50 mg.../lmb

## 2016-01-20 MED ORDER — TRAMADOL HCL 50 MG PO TABS: 50.0000 mg | ORAL_TABLET | Freq: Four times a day (QID) | ORAL | 2 refills | Status: DC | PRN

## 2016-01-20 NOTE — Telephone Encounter (Signed)
Updated epic, called refill into pharmacy left refill on pharmacist vm...[name]

## 2016-03-07 ENCOUNTER — Other Ambulatory Visit (INDEPENDENT_AMBULATORY_CARE_PROVIDER_SITE_OTHER): Payer: Medicare Other

## 2016-03-07 ENCOUNTER — Ambulatory Visit (INDEPENDENT_AMBULATORY_CARE_PROVIDER_SITE_OTHER): Payer: Medicare Other | Admitting: Internal Medicine

## 2016-03-07 ENCOUNTER — Encounter: Payer: Self-pay | Admitting: Internal Medicine

## 2016-03-07 DIAGNOSIS — E559 Vitamin D deficiency, unspecified: Secondary | ICD-10-CM

## 2016-03-07 DIAGNOSIS — E538 Deficiency of other specified B group vitamins: Secondary | ICD-10-CM

## 2016-03-07 DIAGNOSIS — I10 Essential (primary) hypertension: Secondary | ICD-10-CM | POA: Diagnosis not present

## 2016-03-07 DIAGNOSIS — M150 Primary generalized (osteo)arthritis: Secondary | ICD-10-CM

## 2016-03-07 DIAGNOSIS — Z23 Encounter for immunization: Secondary | ICD-10-CM

## 2016-03-07 DIAGNOSIS — M159 Polyosteoarthritis, unspecified: Secondary | ICD-10-CM

## 2016-03-07 LAB — BASIC METABOLIC PANEL
BUN: 16 mg/dL (ref 6–23)
CHLORIDE: 104 meq/L (ref 96–112)
CO2: 28 meq/L (ref 19–32)
CREATININE: 0.84 mg/dL (ref 0.40–1.20)
Calcium: 9.8 mg/dL (ref 8.4–10.5)
GFR: 67.12 mL/min (ref >60.00)
GLUCOSE: 103 mg/dL — AB (ref 70–99)
POTASSIUM: 4.2 meq/L (ref 3.5–5.1)
Sodium: 141 mEq/L (ref 135–145)

## 2016-03-07 LAB — CBC WITH DIFFERENTIAL/PLATELET
BASOS PCT: 0.5 % (ref 0.0–3.0)
Basophils Absolute: 0 10*3/uL (ref 0.0–0.1)
EOS ABS: 0.4 10*3/uL (ref 0.0–0.7)
EOS PCT: 4.5 % (ref 0.0–5.0)
HCT: 36.6 % (ref 36.0–46.0)
HEMOGLOBIN: 12.3 g/dL (ref 12.0–15.0)
Lymphocytes Relative: 14.7 % (ref 12.0–46.0)
Lymphs Abs: 1.3 10*3/uL (ref 0.7–4.0)
MCHC: 33.5 g/dL (ref 30.0–36.0)
MCV: 85 fl (ref 78.0–100.0)
MONO ABS: 0.7 10*3/uL (ref 0.1–1.0)
Monocytes Relative: 8.6 % (ref 3.0–12.0)
NEUTROS ABS: 6.1 10*3/uL (ref 1.4–7.7)
Neutrophils Relative %: 71.7 % (ref 43.0–77.0)
PLATELETS: 268 10*3/uL (ref 150.0–400.0)
RBC: 4.31 Mil/uL (ref 3.87–5.11)
RDW: 13.7 % (ref 11.5–15.5)
WBC: 8.5 10*3/uL (ref 4.0–10.5)

## 2016-03-07 NOTE — Assessment & Plan Note (Signed)
On Vit D 

## 2016-03-07 NOTE — Progress Notes (Signed)
Subjective:  Patient ID: Theresa Hampton, female    DOB: 1920/03/05  Age: 82 y.o. MRN: [id number]  CC: No chief complaint on file.   HPI Theresa Hampton presents for HTN, anxiety, OA f/u  Outpatient Medications Prior to Visit  Medication Sig Dispense Refill  . amLODipine (NORVASC) 2.5 MG tablet Take 1 tablet (2.5 mg total) by mouth daily. 90 tablet 3  . bimatoprost (LUMIGAN) 0.03 % ophthalmic solution 1 drop at bedtime.    . Cholecalciferol 2000 UNITS TABS Take 2 each by mouth at bedtime.    . clobetasol cream (TEMOVATE) AB-123456789 % Apply 1 application topically 2 (two) times daily. Use sparingly. 30 g 2  . clonazePAM (KLONOPIN) 0.25 MG disintegrating tablet Take 1 tablet (0.25 mg total) by mouth 2 (two) times daily as needed (anxiety). 60 tablet 3  . cyanocobalamin 500 MCG tablet Take 500 mcg by mouth daily.    Marland Kitchen estradiol (CLIMARA - DOSED IN MG/24 HR) 0.05 mg/24hr patch Place 1 patch (0.05 mg total) onto the skin once a week. 12 patch 1  . fluticasone (FLONASE) 50 MCG/ACT nasal spray Place 1 spray into the nose daily. 16 g 3  . loratadine (CLARITIN) 10 MG tablet Take 1 tablet (10 mg total) by mouth daily. 100 tablet 2  . metoprolol tartrate (LOPRESSOR) 25 MG tablet Take 1 tablet (25 mg total) by mouth 2 (two) times daily. 180 tablet 3  . mirtazapine (REMERON) 15 MG tablet TAKE 1 TABLET BY MOUTH BEFORE DINNER AT 4-5 PM 90 tablet 0  . ranitidine (ZANTAC) 150 MG tablet Take 1 tablet (150 mg total) by mouth at bedtime. 30 tablet 5  . traMADol (ULTRAM) 50 MG tablet Take 1 tablet (50 mg total) by mouth every 6 (six) hours as needed. for pain 60 tablet 2   No facility-administered medications prior to visit.     ROS Review of Systems  Constitutional: Negative for activity change, appetite change, chills, fatigue and unexpected weight change.  HENT: Negative for congestion, mouth sores and sinus pressure.   Eyes: Negative for visual disturbance.  Respiratory: Negative for cough and chest  tightness.   Gastrointestinal: Negative for abdominal pain and nausea.  Genitourinary: Negative for difficulty urinating, frequency and vaginal pain.  Musculoskeletal: Negative for back pain and gait problem.  Skin: Negative for pallor and rash.  Neurological: Negative for dizziness, tremors, weakness, numbness and headaches.  Psychiatric/Behavioral: Negative for confusion and sleep disturbance. The patient is nervous/anxious.     Objective:  BP 138/70   Pulse 92   Temp 98.3 F (36.8 C) (Oral)   Wt 120 lb (54.4 kg)   SpO2 97%   BMI 19.97 kg/m   BP Readings from Last 3 Encounters:  03/07/16 138/70  11/03/15 130/84  07/29/15 134/80    Wt Readings from Last 3 Encounters:  03/07/16 120 lb (54.4 kg)  11/03/15 120 lb (54.4 kg)  07/29/15 116 lb (52.6 kg)    Physical Exam  Constitutional: She appears well-developed. No distress.  HENT:  Head: Normocephalic.  Right Ear: External ear normal.  Left Ear: External ear normal.  Nose: Nose normal.  Mouth/Throat: Oropharynx is clear and moist.  Eyes: Conjunctivae are normal. Pupils are equal, round, and reactive to light. Right eye exhibits no discharge. Left eye exhibits no discharge.  Neck: Normal range of motion. Neck supple. No JVD present. No tracheal deviation present. No thyromegaly present.  Cardiovascular: Normal rate, regular rhythm and normal heart sounds.   Pulmonary/Chest: No stridor.  No respiratory distress. She has no wheezes.  Abdominal: Soft. Bowel sounds are normal. She exhibits no distension and no mass. There is no tenderness. There is no rebound and no guarding.  Musculoskeletal: She exhibits no edema or tenderness.  Lymphadenopathy:    She has no cervical adenopathy.  Neurological: She displays normal reflexes. No cranial nerve deficit. She exhibits normal muscle tone. Coordination abnormal.  Skin: No rash noted. No erythema.  Psychiatric: She has a normal mood and affect. Her behavior is normal. Judgment and  thought content normal.    Lab Results  Component Value Date   WBC 7.0 11/03/2015   HGB 11.9 (L) 11/03/2015   HCT 35.6 (L) 11/03/2015   PLT 236.0 11/03/2015   GLUCOSE 109 (H) 11/03/2015   CHOL 169 05/20/2012   TRIG 75.0 05/20/2012   HDL 66.60 05/20/2012   LDLDIRECT 125.4 05/10/2011   LDLCALC 87 05/20/2012   ALT 12 (L) 12/30/2014   AST 18 12/30/2014   NA 139 11/03/2015   K 3.8 11/03/2015   CL 105 11/03/2015   CREATININE 0.80 11/03/2015   BUN 17 11/03/2015   CO2 28 11/03/2015   TSH 2.40 07/29/2015   INR 1.07 12/30/2014   HGBA1C 5.6 09/11/2008    No results found.  Assessment & Plan:   There are no diagnoses linked to this encounter. I am having Theresa Hampton maintain her fluticasone, clobetasol cream, amLODipine, metoprolol tartrate, bimatoprost, cyanocobalamin, Cholecalciferol, ranitidine, loratadine, mirtazapine, clonazePAM, estradiol, and traMADol.  No orders of the defined types were placed in this encounter.    Follow-up: No Follow-up on file.  Walker Kehr, MD

## 2016-03-07 NOTE — Progress Notes (Signed)
Pre visit review using our clinic review tool, if applicable. No additional management support is needed unless otherwise documented below in the visit note. 

## 2016-03-07 NOTE — Assessment & Plan Note (Signed)
Off Celebrex Tramadol prn

## 2016-03-07 NOTE — Addendum Note (Signed)
Addended by: Cresenciano Lick on: 03/07/2016 04:59 PM   Modules accepted: Orders

## 2016-03-07 NOTE — Assessment & Plan Note (Signed)
On B12 

## 2016-03-07 NOTE — Assessment & Plan Note (Signed)
Lopressor, Norvasc 

## 2016-03-08 ENCOUNTER — Other Ambulatory Visit: Payer: Self-pay | Admitting: Internal Medicine

## 2016-04-10 ENCOUNTER — Other Ambulatory Visit: Payer: Self-pay | Admitting: *Deleted

## 2016-04-12 NOTE — Telephone Encounter (Signed)
Dr. Camila Li please advise.

## 2016-04-13 MED ORDER — TRAMADOL HCL 50 MG PO TABS: 50.0000 mg | ORAL_TABLET | Freq: Four times a day (QID) | ORAL | 3 refills | Status: DC | PRN

## 2016-04-13 NOTE — Telephone Encounter (Signed)
Called [facility] and left message on pharm line calling in tramadol rx

## 2016-04-20 ENCOUNTER — Other Ambulatory Visit: Payer: Self-pay | Admitting: Internal Medicine

## 2016-04-20 MED ORDER — ESTRADIOL 0.05 MG/24HR TD PTWK: 0.0500 mg | MEDICATED_PATCH | TRANSDERMAL | 1 refills | Status: DC

## 2016-05-22 NOTE — Progress Notes (Signed)
Pre visit review using our clinic review tool, if applicable. No additional management support is needed unless otherwise documented below in the visit note. 

## 2016-05-22 NOTE — Progress Notes (Addendum)
Subjective:   Theresa Hampton is a 83 y.o. female who presents for an Initial Medicare Annual Wellness Visit.  The Patient was informed that the wellness visit is to identify future health risk and educate and initiate measures that can reduce risk for increased disease through the lifespan.   Review of Systems    No ROS.  Medicare Wellness Visit.    Sleep patterns: sleeps 10 hours.  Home Safety/Smoke Alarms:  Smoke detectors and security in place.  Living environment; residence and Firearm Safety: Lives alone in 2 story home, does not access upstairs. Daughters live close. Feels safe in home.  Seat Belt Safety/Bike Helmet: Wears seat belt.   Counseling:   Eye Exam-Last exam 04/2016, every 2 months by [facility].   Dental-Last exam 04/2016, every 6 months by [facility]   Female:   Pap-08/17/2008       Mammo-01/13/2011, negative. Declines further testing.       Dexa scan-12/26/1999, Osteopenia. Declines further testing.  CCS-None.      Objective:    There were no vitals filed for this visit. There is no height or weight on file to calculate BMI.   Current Medications (verified) Outpatient Encounter Prescriptions as of 05/23/2016  Medication Sig  . amLODipine (NORVASC) 2.5 MG tablet TAKE 1 TABLET (2.5 MG TOTAL) BY MOUTH DAILY.  . bimatoprost (LUMIGAN) 0.03 % ophthalmic solution 1 drop at bedtime.  . Cholecalciferol 2000 UNITS TABS Take 2 each by mouth at bedtime.  . clobetasol cream (TEMOVATE) AB-123456789 % Apply 1 application topically 2 (two) times daily. Use sparingly.  . clonazePAM (KLONOPIN) 0.25 MG disintegrating tablet Take 1 tablet (0.25 mg total) by mouth 2 (two) times daily as needed (anxiety).  . cyanocobalamin 500 MCG tablet Take 500 mcg by mouth daily.  Marland Kitchen estradiol (CLIMARA - DOSED IN MG/24 HR) 0.05 mg/24hr patch Place 1 patch (0.05 mg total) onto the skin once a week.  . fluticasone (FLONASE) 50 MCG/ACT nasal spray Place 1 spray into the nose daily.  Marland Kitchen gabapentin (NEURONTIN)  100 MG capsule TAKE 1 CAPSULE (100 MG TOTAL) BY MOUTH AT BEDTIME.  Marland Kitchen loratadine (CLARITIN) 10 MG tablet Take 1 tablet (10 mg total) by mouth daily.  . metoprolol tartrate (LOPRESSOR) 25 MG tablet Take 1 tablet (25 mg total) by mouth 2 (two) times daily.  . mirtazapine (REMERON) 15 MG tablet TAKE 1 TABLET BY MOUTH BEFORE DINNER AT 4-5 PM  . ranitidine (ZANTAC) 150 MG tablet Take 1 tablet (150 mg total) by mouth at bedtime.  . traMADol (ULTRAM) 50 MG tablet Take 1 tablet (50 mg total) by mouth every 6 (six) hours as needed. for pain   No facility-administered encounter medications on file as of 05/23/2016.     Allergies (verified) Lidocaine hcl   History: Past Medical History:  Diagnosis Date  . Allergic rhinitis   . Anemia    iron deficiency  . Chronic kidney disease    Pyelo 2014  . Chronic sinusitis   . Complication of anesthesia   . History of transfusion   . Hyperlipidemia   . Hypertension   . Leg cramps   . Osteoarthritis    Dr. Maureen Ralphs  . Osteoporosis   . Peptic ulcer disease 2003  . PONV (postoperative nausea and vomiting)   . Seizures (HCC)    x1 with Lidocaine  . Vitamin B12 deficiency   . Vitamin D deficiency    Past Surgical History:  Procedure Laterality Date  . JOINT REPLACEMENT  2004  rt total hip  . THYROIDECTOMY, PARTIAL  "many years ago"  . TOTAL HIP ARTHROPLASTY Left 01/06/2015   Procedure: LEFT TOTAL HIP ARTHROPLASTY ANTERIOR APPROACH;  Surgeon: Gaynelle Arabian, MD;  Location: [facility];  Service: Orthopedics;  Laterality: Left;  . uterine ablation  2008   Family History  Problem Relation Age of Onset  . Hypertension    . Arthritis Mother    Social History   Occupational History  . retired    Social History Main Topics  . Smoking status: Never Smoker  . Smokeless tobacco: Not on file  . Alcohol use No  . Drug use: No  . Sexual activity: Not on file    Tobacco Counseling Counseling given: Not Answered   Activities of Daily Living No  flowsheet data found.  Immunizations and Health Maintenance Immunization History  Administered Date(s) Administered  . Influenza Split 01/31/2011, 02/16/2012  . Influenza Whole 02/21/2005, 04/26/2007, 02/07/2008, 02/08/2009, 03/25/2010  . Influenza, High Dose Seasonal PF 02/26/2013, 03/07/2016  . Influenza,inj,Quad PF,36+ Mos 12/31/2013, 03/10/2015  . Pneumococcal Conjugate-13 06/04/2013  . Pneumococcal Polysaccharide-23 05/10/2011  . Td 12/15/2014  . Zoster 01/05/2009   There are no preventive care reminders to display for this patient.  Patient Care Team: Cassandria Anger, MD as PCP - General Gaynelle Arabian, MD as Attending Physician (Orthopedic Surgery)  Indicate any recent Medical Services you may have received from other than [facility] providers in the past year (date may be approximate).     Assessment:   This is a routine wellness examination for Theresa Hampton. Physical assessment deferred to PCP.   Hearing/Vision screen No exam data present  Dietary issues and exercise activities discussed:   Diet (meal preparation, eat out, water intake, caffeinated beverages, dairy products, fruits and vegetables): drinks hot tea and water  Breakfast: toast, jelly, honey, coffee, juice Lunch: eggs, sandwich, fruit, cottage cheese Dinner: vegetables, meat, rotisserie chickens  Discussed eating habits and increasing water intake. Encouraged to maintain activity level.   Goals    None     Depression Screen PHQ 2/9 Scores 10/14/2014  PHQ - 2 Score 1    Fall Risk Fall Risk  11/03/2015 10/14/2014  Falls in the past year? No No    Cognitive Function:       Ad8 score reviewed for issues:  Issues making decisions:no  Less interest in hobbies / activities:no  Repeats questions, stories (family complaining):no  Trouble using ordinary gadgets (microwave, computer, phone):no  Forgets the month or year: no  Mismanaging finances: no  Remembering appts:no  Daily problems with  thinking and/or memory:no Ad8 score is=0     Screening Tests Health Maintenance  Topic Date Due  . TETANUS/TDAP  12/14/2024  . INFLUENZA VACCINE  Completed  . DEXA SCAN  Completed  . ZOSTAVAX  Completed  . PNA vac Low Risk Adult  Completed      Plan:     Eat heart healthy diet (full of fruits, vegetables, whole grains, lean protein, water--limit salt, fat, and sugar intake) and increase physical activity as tolerated.  Continue doing brain stimulating activities (puzzles, reading, adult coloring books, staying active) to keep memory sharp.   Bring a copy of your advance directives to your next office visit.  **FYI-Patient reports sinus pain/headache, requesting prescription for antibiotic. Message sent to PCP.    During the course of the visit, Viva was educated and counseled about the following appropriate screening and preventive services:   Vaccines to include Pneumoccal, Influenza, Hepatitis B, Td, Zostavax,  HCV  Cardiovascular disease screening  Colorectal cancer screening  Bone density screening  Diabetes screening  Glaucoma screening  Mammography/PAP  Nutrition counseling Patient Instructions (the written plan) were given to the patient.    Gerilyn Nestle, RN   05/22/2016   Medical screening examination/treatment/procedure(s) were performed by non-physician practitioner and as supervising physician I was immediately available for consultation/collaboration. I agree with above. Walker Kehr, MD

## 2016-05-23 ENCOUNTER — Ambulatory Visit (INDEPENDENT_AMBULATORY_CARE_PROVIDER_SITE_OTHER): Payer: Medicare Other

## 2016-05-23 ENCOUNTER — Telehealth: Payer: Self-pay

## 2016-05-23 VITALS — BP 138/62 | HR 96 | Ht 65.0 in | Wt 121.0 lb

## 2016-05-23 DIAGNOSIS — Z0000 Encounter for general adult medical examination without abnormal findings: Secondary | ICD-10-CM | POA: Diagnosis not present

## 2016-05-23 MED ORDER — AMOXICILLIN 500 MG PO CAPS: 500.0000 mg | ORAL_CAPSULE | Freq: Three times a day (TID) | ORAL | 0 refills | Status: DC

## 2016-05-23 NOTE — Telephone Encounter (Signed)
Patient in today for AWV, requesting 90 day refill on Clonazepam and Tramadol.  Patient also requesting prescription for antibiotic for sinus infection, reporting sinus pressure and headache.  Patient states prescription has been approved in the past without office visit, due to inclement weather is requesting prescription to be sent to pharmacy if possible.

## 2016-05-23 NOTE — Patient Instructions (Addendum)
Eat heart healthy diet (full of fruits, vegetables, whole grains, lean protein, water--limit salt, fat, and sugar intake) and increase physical activity as tolerated.  Continue doing brain stimulating activities (puzzles, reading, adult coloring books, staying active) to keep memory sharp.   Bring a copy of your advance directives to your next office visit.  Fall Prevention in the Home Introduction Falls can cause injuries. They can happen to people of all ages. There are many things you can do to make your home safe and to help prevent falls. What can I do on the outside of my home?  Regularly fix the edges of walkways and driveways and fix any cracks.  Remove anything that might make you trip as you walk through a door, such as a raised step or threshold.  Trim any bushes or trees on the path to your home.  Use bright outdoor lighting.  Clear any walking paths of anything that might make someone trip, such as rocks or tools.  Regularly check to see if handrails are loose or broken. Make sure that both sides of any steps have handrails.  Any raised decks and porches should have guardrails on the edges.  Have any leaves, snow, or ice cleared regularly.  Use sand or salt on walking paths during winter.  Clean up any spills in your garage right away. This includes oil or grease spills. What can I do in the bathroom?  Use night lights.  Install grab bars by the toilet and in the tub and shower. Do not use towel bars as grab bars.  Use non-skid mats or decals in the tub or shower.  If you need to sit down in the shower, use a plastic, non-slip stool.  Keep the floor dry. Clean up any water that spills on the floor as soon as it happens.  Remove soap buildup in the tub or shower regularly.  Attach bath mats securely with double-sided non-slip rug tape.  Do not have throw rugs and other things on the floor that can make you trip. What can I do in the bedroom?  Use night  lights.  Make sure that you have a light by your bed that is easy to reach.  Do not use any sheets or blankets that are too big for your bed. They should not hang down onto the floor.  Have a firm chair that has side arms. You can use this for support while you get dressed.  Do not have throw rugs and other things on the floor that can make you trip. What can I do in the kitchen?  Clean up any spills right away.  Avoid walking on wet floors.  Keep items that you use a lot in easy-to-reach places.  If you need to reach something above you, use a strong step stool that has a grab bar.  Keep electrical cords out of the way.  Do not use floor polish or wax that makes floors slippery. If you must use wax, use non-skid floor wax.  Do not have throw rugs and other things on the floor that can make you trip. What can I do with my stairs?  Do not leave any items on the stairs.  Make sure that there are handrails on both sides of the stairs and use them. Fix handrails that are broken or loose. Make sure that handrails are as long as the stairways.  Check any carpeting to make sure that it is firmly attached to the stairs. Fix any   carpet that is loose or worn.  Avoid having throw rugs at the top or bottom of the stairs. If you do have throw rugs, attach them to the floor with carpet tape.  Make sure that you have a light switch at the top of the stairs and the bottom of the stairs. If you do not have them, ask someone to add them for you. What else can I do to help prevent falls?  Wear shoes that:  Do not have high heels.  Have rubber bottoms.  Are comfortable and fit you well.  Are closed at the toe. Do not wear sandals.  If you use a stepladder:  Make sure that it is fully opened. Do not climb a closed stepladder.  Make sure that both sides of the stepladder are locked into place.  Ask someone to hold it for you, if possible.  Clearly mark and make sure that you can  see:  Any grab bars or handrails.  First and last steps.  Where the edge of each step is.  Use tools that help you move around (mobility aids) if they are needed. These include:  Canes.  Walkers.  Scooters.  Crutches.  Turn on the lights when you go into a dark area. Replace any light bulbs as soon as they burn out.  Set up your furniture so you have a clear path. Avoid moving your furniture around.  If any of your floors are uneven, fix them.  If there are any pets around you, be aware of where they are.  Review your medicines with your doctor. Some medicines can make you feel dizzy. This can increase your chance of falling. Ask your doctor what other things that you can do to help prevent falls. This information is not intended to replace advice given to you by your health care provider. Make sure you discuss any questions you have with your health care provider. Document Released: 02/18/2009 Document Revised: 09/30/2015 Document Reviewed: 05/29/2014  2017 Elsevier  Health Maintenance, Female Introduction Adopting a healthy lifestyle and getting preventive care can go a long way to promote health and wellness. Talk with your health care provider about what schedule of regular examinations is right for you. This is a good chance for you to check in with your provider about disease prevention and staying healthy. In between checkups, there are plenty of things you can do on your own. Experts have done a lot of research about which lifestyle changes and preventive measures are most likely to keep you healthy. Ask your health care provider for more information. Weight and diet Eat a healthy diet  Be sure to include plenty of vegetables, fruits, low-fat dairy products, and lean protein.  Do not eat a lot of foods high in solid fats, added sugars, or salt.  Get regular exercise. This is one of the most important things you can do for your health.  Most adults should exercise  for at least 150 minutes each week. The exercise should increase your heart rate and make you sweat (moderate-intensity exercise).  Most adults should also do strengthening exercises at least twice a week. This is in addition to the moderate-intensity exercise. Maintain a healthy weight  Body mass index (BMI) is a measurement that can be used to identify possible weight problems. It estimates body fat based on height and weight. Your health care provider can help determine your BMI and help you achieve or maintain a healthy weight.  For females 20 years of   age and older:  A BMI below 18.5 is considered underweight.  A BMI of 18.5 to 24.9 is normal.  A BMI of 25 to 29.9 is considered overweight.  A BMI of 30 and above is considered obese. Watch levels of cholesterol and blood lipids  You should start having your blood tested for lipids and cholesterol at 80 years of age, then have this test every 5 years.  You may need to have your cholesterol levels checked more often if:  Your lipid or cholesterol levels are high.  You are older than 81 years of age.  You are at high risk for heart disease. Cancer screening Lung Cancer  Lung cancer screening is recommended for adults 60-45 years old who are at high risk for lung cancer because of a history of smoking.  A yearly low-dose CT scan of the lungs is recommended for people who:  Currently smoke.  Have quit within the past 15 years.  Have at least a 30-pack-year history of smoking. A pack year is smoking an average of one pack of cigarettes a day for 1 year.  Yearly screening should continue until it has been 15 years since you quit.  Yearly screening should stop if you develop a health problem that would prevent you from having lung cancer treatment. Breast Cancer  Practice breast self-awareness. This means understanding how your breasts normally appear and feel.  It also means doing regular breast self-exams. Let your health  care provider know about any changes, no matter how small.  If you are in your 20s or 30s, you should have a clinical breast exam (CBE) by a health care provider every 1-3 years as part of a regular health exam.  If you are 8 or older, have a CBE every year. Also consider having a breast X-ray (mammogram) every year.  If you have a family history of breast cancer, talk to your health care provider about genetic screening.  If you are at high risk for breast cancer, talk to your health care provider about having an MRI and a mammogram every year.  Breast cancer gene (BRCA) assessment is recommended for women who have family members with BRCA-related cancers. BRCA-related cancers include:  Breast.  Ovarian.  Tubal.  Peritoneal cancers.  Results of the assessment will determine the need for genetic counseling and BRCA1 and BRCA2 testing. Cervical Cancer  Your health care provider may recommend that you be screened regularly for cancer of the pelvic organs (ovaries, uterus, and vagina). This screening involves a pelvic examination, including checking for microscopic changes to the surface of your cervix (Pap test). You may be encouraged to have this screening done every 3 years, beginning at age 49.  For women ages 22-65, health care providers may recommend pelvic exams and Pap testing every 3 years, or they may recommend the Pap and pelvic exam, combined with testing for human papilloma virus (HPV), every 5 years. Some types of HPV increase your risk of cervical cancer. Testing for HPV may also be done on women of any age with unclear Pap test results.  Other health care providers may not recommend any screening for nonpregnant women who are considered low risk for pelvic cancer and who do not have symptoms. Ask your health care provider if a screening pelvic exam is right for you.  If you have had past treatment for cervical cancer or a condition that could lead to cancer, you need Pap  tests and screening for cancer for at least  20 years after your treatment. If Pap tests have been discontinued, your risk factors (such as having a new sexual partner) need to be reassessed to determine if screening should resume. Some women have medical problems that increase the chance of getting cervical cancer. In these cases, your health care provider may recommend more frequent screening and Pap tests. Colorectal Cancer  This type of cancer can be detected and often prevented.  Routine colorectal cancer screening usually begins at 81 years of age and continues through 81 years of age.  Your health care provider may recommend screening at an earlier age if you have risk factors for colon cancer.  Your health care provider may also recommend using home test kits to check for hidden blood in the stool.  A small camera at the end of a tube can be used to examine your colon directly (sigmoidoscopy or colonoscopy). This is done to check for the earliest forms of colorectal cancer.  Routine screening usually begins at age 19.  Direct examination of the colon should be repeated every 5-10 years through 81 years of age. However, you may need to be screened more often if early forms of precancerous polyps or small growths are found. Skin Cancer  Check your skin from head to toe regularly.  Tell your health care provider about any new moles or changes in moles, especially if there is a change in a mole's shape or color.  Also tell your health care provider if you have a mole that is larger than the size of a pencil eraser.  Always use sunscreen. Apply sunscreen liberally and repeatedly throughout the day.  Protect yourself by wearing long sleeves, pants, a wide-brimmed hat, and sunglasses whenever you are outside. Heart disease, diabetes, and high blood pressure  High blood pressure causes heart disease and increases the risk of stroke. High blood pressure is more likely to develop  in:  People who have blood pressure in the high end of the normal range (130-139/85-89 mm Hg).  People who are overweight or obese.  People who are African American.  If you are 1-61 years of age, have your blood pressure checked every 3-5 years. If you are 44 years of age or older, have your blood pressure checked every year. You should have your blood pressure measured twice-once when you are at a hospital or clinic, and once when you are not at a hospital or clinic. Record the average of the two measurements. To check your blood pressure when you are not at a hospital or clinic, you can use:  An automated blood pressure machine at a pharmacy.  A home blood pressure monitor.  If you are between 58 years and 39 years old, ask your health care provider if you should take aspirin to prevent strokes.  Have regular diabetes screenings. This involves taking a blood sample to check your fasting blood sugar level.  If you are at a normal weight and have a low risk for diabetes, have this test once every three years after 81 years of age.  If you are overweight and have a high risk for diabetes, consider being tested at a younger age or more often. Preventing infection Hepatitis B  If you have a higher risk for hepatitis B, you should be screened for this virus. You are considered at high risk for hepatitis B if:  You were born in a country where hepatitis B is common. Ask your health care provider which countries are considered high risk.  Your parents were born in a high-risk country, and you have not been immunized against hepatitis B (hepatitis B vaccine).  You have HIV or AIDS.  You use needles to inject street drugs.  You live with someone who has hepatitis B.  You have had sex with someone who has hepatitis B.  You get hemodialysis treatment.  You take certain medicines for conditions, including cancer, organ transplantation, and autoimmune conditions. Hepatitis C  Blood  testing is recommended for:  Everyone born from 1945 through 1965.  Anyone with known risk factors for hepatitis C. Sexually transmitted infections (STIs)  You should be screened for sexually transmitted infections (STIs) including gonorrhea and chlamydia if:  You are sexually active and are younger than 81 years of age.  You are older than 81 years of age and your health care provider tells you that you are at risk for this type of infection.  Your sexual activity has changed since you were last screened and you are at an increased risk for chlamydia or gonorrhea. Ask your health care provider if you are at risk.  If you do not have HIV, but are at risk, it may be recommended that you take a prescription medicine daily to prevent HIV infection. This is called pre-exposure prophylaxis (PrEP). You are considered at risk if:  You are sexually active and do not regularly use condoms or know the HIV status of your partner(s).  You take drugs by injection.  You are sexually active with a partner who has HIV. Talk with your health care provider about whether you are at high risk of being infected with HIV. If you choose to begin PrEP, you should first be tested for HIV. You should then be tested every 3 months for as long as you are taking PrEP. Pregnancy  If you are premenopausal and you may become pregnant, ask your health care provider about preconception counseling.  If you may become pregnant, take 400 to 800 micrograms (mcg) of folic acid every day.  If you want to prevent pregnancy, talk to your health care provider about birth control (contraception). Osteoporosis and menopause  Osteoporosis is a disease in which the bones lose minerals and strength with aging. This can result in serious bone fractures. Your risk for osteoporosis can be identified using a bone density scan.  If you are 65 years of age or older, or if you are at risk for osteoporosis and fractures, ask your health  care provider if you should be screened.  Ask your health care provider whether you should take a calcium or vitamin D supplement to lower your risk for osteoporosis.  Menopause may have certain physical symptoms and risks.  Hormone replacement therapy may reduce some of these symptoms and risks. Talk to your health care provider about whether hormone replacement therapy is right for you. Follow these instructions at home:  Schedule regular health, dental, and eye exams.  Stay current with your immunizations.  Do not use any tobacco products including cigarettes, chewing tobacco, or electronic cigarettes.  If you are pregnant, do not drink alcohol.  If you are breastfeeding, limit how much and how often you drink alcohol.  Limit alcohol intake to no more than 1 drink per day for nonpregnant women. One drink equals 12 ounces of beer, 5 ounces of wine, or 1 ounces of hard liquor.  Do not use street drugs.  Do not share needles.  Ask your health care provider for help if you need support or   information about quitting drugs.  Tell your health care provider if you often feel depressed.  Tell your health care provider if you have ever been abused or do not feel safe at home. This information is not intended to replace advice given to you by your health care provider. Make sure you discuss any questions you have with your health care provider. Document Released: 11/07/2010 Document Revised: 09/30/2015 Document Reviewed: 01/26/2015  2017 Elsevier  

## 2016-05-23 NOTE — Telephone Encounter (Signed)
OK to fill this/these prescription(s) with additional refills x1 OK abx Thank you!

## 2016-05-24 ENCOUNTER — Other Ambulatory Visit: Payer: Self-pay

## 2016-05-24 MED ORDER — CLONAZEPAM 0.25 MG PO TBDP: 0.2500 mg | ORAL_TABLET | Freq: Two times a day (BID) | ORAL | 3 refills | Status: DC | PRN

## 2016-05-24 MED ORDER — TRAMADOL HCL 50 MG PO TABS: 50.0000 mg | ORAL_TABLET | Freq: Four times a day (QID) | ORAL | 3 refills | Status: DC | PRN

## 2016-05-24 NOTE — Telephone Encounter (Signed)
Refills e-scribed to pharmacy on file.

## 2016-05-29 ENCOUNTER — Other Ambulatory Visit: Payer: Self-pay | Admitting: Internal Medicine

## 2016-05-29 MED ORDER — AMOXICILLIN 500 MG PO CAPS: 500.0000 mg | ORAL_CAPSULE | Freq: Three times a day (TID) | ORAL | 0 refills | Status: DC

## 2016-05-29 NOTE — Telephone Encounter (Signed)
Pt calling back regarding request below. Abx was sent to wrong pharmacy. Tramadol and Clonazepam were never sent to any pharmacy. Pt fills these scripts at [facility]. Will send/phone in below requested meds to [facility]. Pt informed

## 2016-06-14 ENCOUNTER — Other Ambulatory Visit: Payer: Self-pay | Admitting: Internal Medicine

## 2016-07-11 ENCOUNTER — Ambulatory Visit: Payer: Medicare Other | Admitting: Internal Medicine

## 2016-07-18 ENCOUNTER — Ambulatory Visit: Payer: Medicare Other | Admitting: Internal Medicine

## 2016-07-20 DIAGNOSIS — H52221 Regular astigmatism, right eye: Secondary | ICD-10-CM | POA: Diagnosis not present

## 2016-07-20 DIAGNOSIS — H5212 Myopia, left eye: Secondary | ICD-10-CM | POA: Diagnosis not present

## 2016-07-20 DIAGNOSIS — H524 Presbyopia: Secondary | ICD-10-CM | POA: Diagnosis not present

## 2016-07-20 DIAGNOSIS — H52222 Regular astigmatism, left eye: Secondary | ICD-10-CM | POA: Diagnosis not present

## 2016-07-20 DIAGNOSIS — H401132 Primary open-angle glaucoma, bilateral, moderate stage: Secondary | ICD-10-CM | POA: Diagnosis not present

## 2016-07-20 DIAGNOSIS — H04123 Dry eye syndrome of bilateral lacrimal glands: Secondary | ICD-10-CM | POA: Diagnosis not present

## 2016-08-01 ENCOUNTER — Other Ambulatory Visit (INDEPENDENT_AMBULATORY_CARE_PROVIDER_SITE_OTHER): Payer: Medicare Other

## 2016-08-01 ENCOUNTER — Encounter: Payer: Self-pay | Admitting: Internal Medicine

## 2016-08-01 ENCOUNTER — Ambulatory Visit (INDEPENDENT_AMBULATORY_CARE_PROVIDER_SITE_OTHER): Payer: Medicare Other | Admitting: Internal Medicine

## 2016-08-01 DIAGNOSIS — I10 Essential (primary) hypertension: Secondary | ICD-10-CM

## 2016-08-01 DIAGNOSIS — J3089 Other allergic rhinitis: Secondary | ICD-10-CM

## 2016-08-01 DIAGNOSIS — E538 Deficiency of other specified B group vitamins: Secondary | ICD-10-CM

## 2016-08-01 DIAGNOSIS — J320 Chronic maxillary sinusitis: Secondary | ICD-10-CM

## 2016-08-01 DIAGNOSIS — F411 Generalized anxiety disorder: Secondary | ICD-10-CM

## 2016-08-01 LAB — URINALYSIS, ROUTINE W REFLEX MICROSCOPIC
HGB URINE DIPSTICK: NEGATIVE
NITRITE: NEGATIVE
PH: 5 (ref 5.0–8.0)
Specific Gravity, Urine: >=1.03 — AB (ref 1.000–1.030)
TOTAL PROTEIN, URINE-UPE24: NEGATIVE
Urine Glucose: NEGATIVE
Urobilinogen, UA: 0.2 (ref 0.0–1.0)

## 2016-08-01 LAB — BASIC METABOLIC PANEL
BUN: 19 mg/dL (ref 6–23)
CALCIUM: 9.4 mg/dL (ref 8.4–10.5)
CO2: 28 mEq/L (ref 19–32)
Chloride: 105 mEq/L (ref 96–112)
Creatinine, Ser: 0.74 mg/dL (ref 0.40–1.20)
GFR: 77.63 mL/min (ref >60.00)
Glucose, Bld: 98 mg/dL (ref 70–99)
Potassium: 4 mEq/L (ref 3.5–5.1)
Sodium: 140 mEq/L (ref 135–145)

## 2016-08-01 LAB — TSH: TSH: 3.83 u[IU]/mL (ref 0.35–4.50)

## 2016-08-01 NOTE — Assessment & Plan Note (Signed)
On B12 

## 2016-08-01 NOTE — Assessment & Plan Note (Signed)
On Remeron Clonazepam

## 2016-08-01 NOTE — Progress Notes (Signed)
Subjective:  Patient ID: Theresa Hampton, female    DOB: [birth year]  Age: 83 y.o. MRN: [id number]  CC: Hypertension; Osteoarthritis; Headache; and Sinus Problem (blood mucus in nostril,  )   HPI [facility] [place] presents for HTN, OA, anxiety f/u C/o sinusitis sx's x weeks - abx helped   Outpatient Medications Prior to Visit  Medication Sig Dispense Refill  . amLODipine (NORVASC) 2.5 MG tablet TAKE 1 TABLET (2.5 MG TOTAL) BY MOUTH DAILY. 90 tablet 2  . amoxicillin (AMOXIL) 500 MG capsule Take 1 capsule (500 mg total) by mouth 3 (three) times daily. 30 capsule 0  . bimatoprost (LUMIGAN) 0.03 % ophthalmic solution 1 drop at bedtime.    . Cholecalciferol 2000 UNITS TABS Take 2 each by mouth at bedtime.    . clobetasol cream (TEMOVATE) 5.42 % Apply 1 application topically 2 (two) times daily. Use sparingly. 30 g 2  . clonazePAM (KLONOPIN) 0.25 MG disintegrating tablet Take 1 tablet (0.25 mg total) by mouth 2 (two) times daily as needed (anxiety). 60 tablet 3  . cyanocobalamin 500 MCG tablet Take 500 mcg by mouth daily.    Marland Kitchen estradiol (CLIMARA - DOSED IN MG/24 HR) 0.05 mg/24hr patch Place 1 patch (0.05 mg total) onto the skin once a week. 12 patch 1  . fluticasone (FLONASE) 50 MCG/ACT nasal spray Place 1 spray into the nose daily. 16 g 3  . gabapentin (NEURONTIN) 100 MG capsule TAKE 1 CAPSULE (100 MG TOTAL) BY MOUTH AT BEDTIME. 90 capsule 2  . loratadine (CLARITIN) 10 MG tablet Take 1 tablet (10 mg total) by mouth daily. 100 tablet 2  . metoprolol tartrate (LOPRESSOR) 25 MG tablet Take 1 tablet (25 mg total) by mouth 2 (two) times daily. 180 tablet 3  . mirtazapine (REMERON) 15 MG tablet TAKE 1 TABLET BY MOUTH BEFORE DINNER AT 4-5 PM 90 tablet 0  . ranitidine (ZANTAC) 150 MG tablet Take 1 tablet (150 mg total) by mouth at bedtime. 30 tablet 5  . traMADol (ULTRAM) 50 MG tablet Take 1 tablet (50 mg total) by mouth every 6 (six) hours as needed. for pain 60 tablet 3   No facility-administered  medications prior to visit.     ROS Review of Systems  Constitutional: Positive for fatigue. Negative for activity change, appetite change, chills and unexpected weight change.  HENT: Negative for congestion, mouth sores and sinus pressure.   Eyes: Negative for visual disturbance.  Respiratory: Negative for cough and chest tightness.   Gastrointestinal: Negative for abdominal pain and nausea.  Genitourinary: Negative for difficulty urinating, frequency and vaginal pain.  Musculoskeletal: Positive for arthralgias and gait problem. Negative for back pain.  Skin: Negative for pallor and rash.  Neurological: Negative for dizziness, tremors, weakness, numbness and headaches.  Psychiatric/Behavioral: Negative for confusion, sleep disturbance and suicidal ideas. The patient is nervous/anxious.     Objective:  BP 128/76   Pulse 83   Temp 97.9 F (36.6 C) (Oral)   Resp 16   Ht 5\' 5"  (1.651 m)   Wt 123 lb 12 oz (56.1 kg)   SpO2 97%   BMI 20.59 kg/m   BP Readings from Last 3 Encounters:  08/01/16 128/76  05/23/16 138/62  03/07/16 138/70    Wt Readings from Last 3 Encounters:  08/01/16 123 lb 12 oz (56.1 kg)  05/23/16 121 lb 0.6 oz (54.9 kg)  03/07/16 120 lb (54.4 kg)    Physical Exam  Constitutional: She appears well-developed. No distress.  HENT:  Head: Normocephalic.  Right Ear: External ear normal.  Left Ear: External ear normal.  Nose: Nose normal.  Mouth/Throat: Oropharynx is clear and moist.  Eyes: Conjunctivae are normal. Pupils are equal, round, and reactive to light. Right eye exhibits no discharge. Left eye exhibits no discharge.  Neck: Normal range of motion. Neck supple. No JVD present. No tracheal deviation present. No thyromegaly present.  Cardiovascular: Normal rate, regular rhythm and normal heart sounds.   Pulmonary/Chest: No stridor. No respiratory distress. She has no wheezes.  Abdominal: Soft. Bowel sounds are normal. She exhibits no distension and no  mass. There is no tenderness. There is no rebound and no guarding.  Musculoskeletal: She exhibits no edema or tenderness.  Lymphadenopathy:    She has no cervical adenopathy.  Neurological: She displays normal reflexes. No cranial nerve deficit. She exhibits normal muscle tone. Coordination abnormal.  Skin: No rash noted. No erythema.  Psychiatric: She has a normal mood and affect. Her behavior is normal. Judgment and thought content normal.    Lab Results  Component Value Date   WBC 8.5 03/07/2016   HGB 12.3 03/07/2016   HCT 36.6 03/07/2016   PLT 268.0 03/07/2016   GLUCOSE 103 (H) 03/07/2016   CHOL 169 05/20/2012   TRIG 75.0 05/20/2012   HDL 66.60 05/20/2012   LDLDIRECT 125.4 05/10/2011   LDLCALC 87 05/20/2012   ALT 12 (L) 12/30/2014   AST 18 12/30/2014   NA 141 03/07/2016   K 4.2 03/07/2016   CL 104 03/07/2016   CREATININE 0.84 03/07/2016   BUN 16 03/07/2016   CO2 28 03/07/2016   TSH 2.40 07/29/2015   INR 1.07 12/30/2014   HGBA1C 5.6 09/11/2008    No results found.  Assessment & Plan:   There are no diagnoses linked to this encounter. I am having Ms. Broz maintain her fluticasone, clobetasol cream, metoprolol tartrate, bimatoprost, cyanocobalamin, Cholecalciferol, ranitidine, loratadine, amLODipine, gabapentin, estradiol, traMADol, clonazePAM, amoxicillin, mirtazapine, and LUMIGAN.  Meds ordered this encounter  Medications  . LUMIGAN 0.01 % SOLN    Sig: instill 1 drop IN [facility] AT BEDTIME    Refill:  3     Follow-up: No Follow-up on file.  Walker Kehr, MD

## 2016-08-01 NOTE — Assessment & Plan Note (Signed)
Treated w/Amoxicillin

## 2016-08-01 NOTE — Progress Notes (Signed)
Pre-visit discussion using our clinic review tool. No additional management support is needed unless otherwise documented below in the visit note.  

## 2016-08-01 NOTE — Assessment & Plan Note (Signed)
Lopressor, Norvasc [facility]

## 2016-08-01 NOTE — Assessment & Plan Note (Signed)
Flonase

## 2016-08-21 ENCOUNTER — Other Ambulatory Visit: Payer: Self-pay | Admitting: Internal Medicine

## 2016-08-21 NOTE — Telephone Encounter (Signed)
Routing to dr plotnikov, please advise, thanks 

## 2016-08-23 ENCOUNTER — Telehealth: Payer: Self-pay | Admitting: Internal Medicine

## 2016-08-23 NOTE — Telephone Encounter (Signed)
Left voicemail on [facility] pharm line

## 2016-08-23 NOTE — Telephone Encounter (Signed)
Pt's care taker (sarah) called request refill for Tramadol and new rx for Clobetasol Cream send into [facility] [address]. Please help

## 2016-08-24 NOTE — Telephone Encounter (Signed)
Ok Thx 

## 2016-08-24 NOTE — Telephone Encounter (Signed)
Routing to dr plotnikov----tramadol rx has already been sent---are you ok with clobetasol cream rx being sent to [facility]----please advise, thanks

## 2016-08-25 ENCOUNTER — Other Ambulatory Visit: Payer: Self-pay

## 2016-08-25 MED ORDER — CLOBETASOL PROPIONATE 0.05 % EX CREA: 1.0000 "application " | TOPICAL_CREAM | Freq: Two times a day (BID) | CUTANEOUS | 2 refills | Status: DC

## 2016-08-25 NOTE — Telephone Encounter (Signed)
rx sent to [facility]

## 2016-10-04 ENCOUNTER — Other Ambulatory Visit: Payer: Self-pay | Admitting: Internal Medicine

## 2016-10-31 ENCOUNTER — Telehealth: Payer: Self-pay | Admitting: Internal Medicine

## 2016-10-31 NOTE — Telephone Encounter (Signed)
Pt's caregiver called checking on the status of a refill. She said that the pharmacy has sent a request and she has called also but I did not see anything documented. She is needing a refill on traMADol (ULTRAM) 50 MG tablet to be sent to [facility] on [address].

## 2016-11-01 MED ORDER — TRAMADOL HCL 50 MG PO TABS: 50.0000 mg | ORAL_TABLET | Freq: Four times a day (QID) | ORAL | 2 refills | Status: DC | PRN

## 2016-11-01 NOTE — Telephone Encounter (Signed)
RX printed and ready to be signed and faxed

## 2016-12-05 ENCOUNTER — Ambulatory Visit: Payer: Medicare Other | Admitting: Internal Medicine

## 2016-12-12 ENCOUNTER — Ambulatory Visit: Payer: Medicare Other | Admitting: Internal Medicine

## 2016-12-12 ENCOUNTER — Other Ambulatory Visit: Payer: Self-pay | Admitting: Internal Medicine

## 2016-12-19 ENCOUNTER — Encounter: Payer: Self-pay | Admitting: Internal Medicine

## 2016-12-19 ENCOUNTER — Ambulatory Visit (INDEPENDENT_AMBULATORY_CARE_PROVIDER_SITE_OTHER): Payer: Medicare Other | Admitting: Internal Medicine

## 2016-12-19 DIAGNOSIS — E559 Vitamin D deficiency, unspecified: Secondary | ICD-10-CM

## 2016-12-19 DIAGNOSIS — R29898 Other symptoms and signs involving the musculoskeletal system: Secondary | ICD-10-CM

## 2016-12-19 DIAGNOSIS — I10 Essential (primary) hypertension: Secondary | ICD-10-CM

## 2016-12-19 DIAGNOSIS — E538 Deficiency of other specified B group vitamins: Secondary | ICD-10-CM

## 2016-12-19 MED ORDER — ZOSTER VAC RECOMB ADJUVANTED 50 MCG/0.5ML IM SUSR: 0.5000 mL | Freq: Once | INTRAMUSCULAR | 1 refills | Status: AC

## 2016-12-19 NOTE — Assessment & Plan Note (Signed)
On Vit D 

## 2016-12-19 NOTE — Assessment & Plan Note (Signed)
On B12 

## 2016-12-19 NOTE — Assessment & Plan Note (Signed)
On Lopressor and Norvasc 

## 2016-12-19 NOTE — Assessment & Plan Note (Signed)
Off statin  

## 2016-12-19 NOTE — Progress Notes (Signed)
Subjective:  Patient ID: Theresa Hampton, female    DOB: [birth year]  Age: 83 y.o. MRN: [id number]  CC: No chief complaint on file.   HPI JOELENE BARRIERE presents for OA, anxiety, HTN f/u  Outpatient Medications Prior to Visit  Medication Sig Dispense Refill  . amLODipine (NORVASC) 2.5 MG tablet TAKE 1 TABLET (2.5 MG TOTAL) BY MOUTH DAILY. 90 tablet 1  . amoxicillin (AMOXIL) 500 MG capsule Take 1 capsule (500 mg total) by mouth 3 (three) times daily. 30 capsule 0  . bimatoprost (LUMIGAN) 0.03 % ophthalmic solution 1 drop at bedtime.    . Cholecalciferol 2000 UNITS TABS Take 2 each by mouth at bedtime.    . clobetasol cream (TEMOVATE) 0.62 % Apply 1 application topically 2 (two) times daily. Use sparingly. 30 g 2  . clonazePAM (KLONOPIN) 0.25 MG disintegrating tablet Take 1 tablet (0.25 mg total) by mouth 2 (two) times daily as needed (anxiety). 60 tablet 3  . cyanocobalamin 500 MCG tablet Take 500 mcg by mouth daily.    Marland Kitchen estradiol (CLIMARA - DOSED IN MG/24 HR) 0.05 mg/24hr patch Place 1 patch (0.05 mg total) onto the skin once a week. 12 patch 1  . fluticasone (FLONASE) 50 MCG/ACT nasal spray Place 1 spray into the nose daily. 16 g 3  . gabapentin (NEURONTIN) 100 MG capsule TAKE 1 CAPSULE (100 MG TOTAL) BY MOUTH AT BEDTIME. 90 capsule 2  . loratadine (CLARITIN) 10 MG tablet Take 1 tablet (10 mg total) by mouth daily. 100 tablet 2  . LUMIGAN 0.01 % SOLN instill 1 drop IN [facility] EYE AT BEDTIME  3  . metoprolol tartrate (LOPRESSOR) 25 MG tablet Take 1 tablet (25 mg total) by mouth 2 (two) times daily. 180 tablet 3  . mirtazapine (REMERON) 15 MG tablet TAKE ONE TABLET BY MOUTH EVERY EVENING BEFORE DINNER AT 4 - 5 PM 90 tablet 1  . ranitidine (ZANTAC) 150 MG tablet Take 1 tablet (150 mg total) by mouth at bedtime. 30 tablet 5  . traMADol (ULTRAM) 50 MG tablet Take 1 tablet (50 mg total) by mouth every 6 (six) hours as needed. for pain 60 tablet 2   No facility-administered medications prior  to visit.     ROS Review of Systems  Constitutional: Negative for activity change, appetite change, chills, fatigue and unexpected weight change.  HENT: Negative for congestion, mouth sores and sinus pressure.   Eyes: Negative for visual disturbance.  Respiratory: Negative for cough and chest tightness.   Gastrointestinal: Negative for abdominal pain and nausea.  Genitourinary: Negative for difficulty urinating, frequency and vaginal pain.  Musculoskeletal: Positive for arthralgias. Negative for back pain and gait problem.  Skin: Negative for pallor and rash.  Neurological: Positive for weakness. Negative for dizziness, tremors, numbness and headaches.  Psychiatric/Behavioral: Positive for decreased concentration. Negative for confusion and sleep disturbance.    Objective:  BP 120/72 (BP Location: Right Arm, Patient Position: Sitting, Cuff Size: Normal)   Pulse 78   Temp (!) 97.5 F (36.4 C) (Oral)   Ht 5\' 5"  (1.651 m)   Wt 125 lb (56.7 kg)   SpO2 98%   BMI 20.80 kg/m   BP Readings from Last 3 Encounters:  12/19/16 120/72  08/01/16 128/76  05/23/16 138/62    Wt Readings from Last 3 Encounters:  12/19/16 125 lb (56.7 kg)  08/01/16 123 lb 12 oz (56.1 kg)  05/23/16 121 lb 0.6 oz (54.9 kg)    Physical Exam  Constitutional: She  appears well-developed. No distress.  HENT:  Head: Normocephalic.  Right Ear: External ear normal.  Left Ear: External ear normal.  Nose: Nose normal.  Mouth/Throat: Oropharynx is clear and moist.  Eyes: Pupils are equal, round, and reactive to light. Conjunctivae are normal. Right eye exhibits no discharge. Left eye exhibits no discharge.  Neck: Normal range of motion. Neck supple. No JVD present. No tracheal deviation present. No thyromegaly present.  Cardiovascular: Normal rate, regular rhythm and normal heart sounds.   Pulmonary/Chest: No stridor. No respiratory distress. She has no wheezes.  Abdominal: Soft. Bowel sounds are normal. She  exhibits no distension and no mass. There is no tenderness. There is no rebound and no guarding.  Musculoskeletal: She exhibits tenderness. She exhibits no edema.  Lymphadenopathy:    She has no cervical adenopathy.  Neurological: She displays normal reflexes. No cranial nerve deficit. She exhibits normal muscle tone. Coordination normal.  Skin: No rash noted. No erythema.  Psychiatric: She has a normal mood and affect. Her behavior is normal. Judgment and thought content normal.    Lab Results  Component Value Date   WBC 8.5 03/07/2016   HGB 12.3 03/07/2016   HCT 36.6 03/07/2016   PLT 268.0 03/07/2016   GLUCOSE 98 08/01/2016   CHOL 169 05/20/2012   TRIG 75.0 05/20/2012   HDL 66.60 05/20/2012   LDLDIRECT 125.4 05/10/2011   LDLCALC 87 05/20/2012   ALT 12 (L) 12/30/2014   AST 18 12/30/2014   NA 140 08/01/2016   K 4.0 08/01/2016   CL 105 08/01/2016   CREATININE 0.74 08/01/2016   BUN 19 08/01/2016   CO2 28 08/01/2016   TSH 3.83 08/01/2016   INR 1.07 12/30/2014   HGBA1C 5.6 09/11/2008    No results found.  Assessment & Plan:   There are no diagnoses linked to this encounter. I am having Ms. Traweek maintain her fluticasone, metoprolol tartrate, bimatoprost, cyanocobalamin, Cholecalciferol, ranitidine, loratadine, gabapentin, estradiol, clonazePAM, amoxicillin, LUMIGAN, clobetasol cream, mirtazapine, traMADol, and amLODipine.  No orders of the defined types were placed in this encounter.    Follow-up: No Follow-up on file.  Walker Kehr, MD

## 2017-01-09 ENCOUNTER — Telehealth: Payer: Self-pay | Admitting: Internal Medicine

## 2017-01-09 NOTE — Telephone Encounter (Signed)
Patient requesting Tramadol, 50 mg tablets.  Pt takes 1 tablet every 6 hours as needed for pain.  Last filled on 12/12/16 and last OV was 12/19/16.  Please advise.

## 2017-01-12 NOTE — Telephone Encounter (Signed)
Notified patient.

## 2017-01-12 NOTE — Telephone Encounter (Signed)
Patient is almost out of medication.  Patient would like script today.

## 2017-01-12 NOTE — Telephone Encounter (Signed)
It was renewed on 9/5 Thx

## 2017-01-15 ENCOUNTER — Other Ambulatory Visit: Payer: Self-pay | Admitting: Internal Medicine

## 2017-01-15 NOTE — Telephone Encounter (Signed)
Theresa Hampton called in and said that pharmacy does not have this med.  She wants to know if this can be recalled in asap?  Pharmacy is correct

## 2017-01-15 NOTE — Telephone Encounter (Signed)
Called refill into Theresa Hampton had to leave on pharmacy vm...Theresa Hampton

## 2017-02-12 ENCOUNTER — Telehealth: Payer: Self-pay | Admitting: *Deleted

## 2017-02-12 MED ORDER — GABAPENTIN 100 MG PO CAPS: ORAL_CAPSULE | ORAL | 1 refills | Status: DC

## 2017-02-12 MED ORDER — MIRTAZAPINE 15 MG PO TABS: ORAL_TABLET | ORAL | 1 refills | Status: DC

## 2017-02-12 NOTE — Telephone Encounter (Signed)
Left msg on triage stating mom is changing pharmacy to [facility]. She is needing refills on her Tramadol, Gabapentin, and remeron. Per chart Tramadol was filled back on 01/10/17 # 60 w/2 refills. Called [facility] to verify rx for Tramadol. Per April rx was filled on 01/15/17 # 60 w/no refills. Check [place] registry last filled tramadol 9/10. No refills. Called remaining refills into [facility] spoke w/CaROL pharmacist gave MD authorization remaining refills on Tramadol. Notified sara rx' has been sent. Updated phar,acy...[name]

## 2017-02-14 DIAGNOSIS — H04123 Dry eye syndrome of bilateral lacrimal glands: Secondary | ICD-10-CM | POA: Diagnosis not present

## 2017-02-14 DIAGNOSIS — H401132 Primary open-angle glaucoma, bilateral, moderate stage: Secondary | ICD-10-CM | POA: Diagnosis not present

## 2017-02-14 DIAGNOSIS — H26491 Other secondary cataract, right eye: Secondary | ICD-10-CM | POA: Diagnosis not present

## 2017-02-14 DIAGNOSIS — Z961 Presence of intraocular lens: Secondary | ICD-10-CM | POA: Diagnosis not present

## 2017-03-01 ENCOUNTER — Ambulatory Visit (INDEPENDENT_AMBULATORY_CARE_PROVIDER_SITE_OTHER): Payer: Medicare Other | Admitting: General Practice

## 2017-03-01 DIAGNOSIS — Z23 Encounter for immunization: Secondary | ICD-10-CM

## 2017-03-05 ENCOUNTER — Other Ambulatory Visit: Payer: Self-pay | Admitting: Internal Medicine

## 2017-03-07 NOTE — Telephone Encounter (Signed)
Faxed script back to [facility]...[name]

## 2017-03-28 ENCOUNTER — Encounter: Payer: Self-pay | Admitting: Internal Medicine

## 2017-03-28 ENCOUNTER — Ambulatory Visit (INDEPENDENT_AMBULATORY_CARE_PROVIDER_SITE_OTHER): Payer: Medicare Other | Admitting: Internal Medicine

## 2017-03-28 ENCOUNTER — Other Ambulatory Visit (INDEPENDENT_AMBULATORY_CARE_PROVIDER_SITE_OTHER): Payer: Medicare Other

## 2017-03-28 DIAGNOSIS — E538 Deficiency of other specified B group vitamins: Secondary | ICD-10-CM

## 2017-03-28 DIAGNOSIS — I10 Essential (primary) hypertension: Secondary | ICD-10-CM | POA: Diagnosis not present

## 2017-03-28 DIAGNOSIS — E785 Hyperlipidemia, unspecified: Secondary | ICD-10-CM

## 2017-03-28 LAB — BASIC METABOLIC PANEL
BUN: 13 mg/dL (ref 6–23)
CALCIUM: 10 mg/dL (ref 8.4–10.5)
CHLORIDE: 104 meq/L (ref 96–112)
CO2: 30 mEq/L (ref 19–32)
CREATININE: 0.79 mg/dL (ref 0.40–1.20)
GFR: 71.89 mL/min (ref >60.00)
Glucose, Bld: 100 mg/dL — ABNORMAL HIGH (ref 70–99)
Potassium: 4.5 mEq/L (ref 3.5–5.1)
Sodium: 141 mEq/L (ref 135–145)

## 2017-03-28 NOTE — Assessment & Plan Note (Signed)
Lopressor, Norvasc 

## 2017-03-28 NOTE — Assessment & Plan Note (Signed)
On B12 

## 2017-03-28 NOTE — Progress Notes (Signed)
Subjective:  Patient ID: Theresa Hampton, female    DOB: 06/04/1919  Age: 83 y.o. MRN: [id number]  CC: No chief complaint on file.   HPI Theresa Hampton presents for HTN, OA, anxiety f/u  Outpatient Medications Prior to Visit  Medication Sig Dispense Refill  . amLODipine (NORVASC) 2.5 MG tablet TAKE 1 TABLET (2.5 MG TOTAL) BY MOUTH DAILY. 90 tablet 1  . amoxicillin (AMOXIL) 500 MG capsule Take 1 capsule (500 mg total) by mouth 3 (three) times daily. 30 capsule 0  . bimatoprost (LUMIGAN) 0.03 % ophthalmic solution 1 drop at bedtime.    . Cholecalciferol 2000 UNITS TABS Take 2 each by mouth at bedtime.    . clobetasol cream (TEMOVATE) 2.22 % Apply 1 application topically 2 (two) times daily. Use sparingly. 30 g 2  . clonazePAM (KLONOPIN) 0.25 MG disintegrating tablet DISSOLVE ONE TABLET BY MOUTH TWICE A DAY AS NEEDED FOR ANXIETY 60 tablet 2  . cyanocobalamin 500 MCG tablet Take 500 mcg by mouth daily.    Marland Kitchen estradiol (CLIMARA - DOSED IN MG/24 HR) 0.05 mg/24hr patch Place 1 patch (0.05 mg total) onto the skin once a week. 12 patch 1  . fluticasone (FLONASE) 50 MCG/ACT nasal spray Place 1 spray into the nose daily. 16 g 3  . gabapentin (NEURONTIN) 100 MG capsule TAKE 1 CAPSULE (100 MG TOTAL) BY MOUTH AT BEDTIME. 90 capsule 1  . loratadine (CLARITIN) 10 MG tablet Take 1 tablet (10 mg total) by mouth daily. 100 tablet 2  . LUMIGAN 0.01 % SOLN instill 1 drop IN [facility] EYE AT BEDTIME  3  . metoprolol tartrate (LOPRESSOR) 25 MG tablet Take 1 tablet (25 mg total) by mouth 2 (two) times daily. 180 tablet 3  . mirtazapine (REMERON) 15 MG tablet TAKE ONE TABLET BY MOUTH EVERY EVENING BEFORE DINNER AT 4 - 5 PM 90 tablet 1  . ranitidine (ZANTAC) 150 MG tablet Take 1 tablet (150 mg total) by mouth at bedtime. 30 tablet 5  . traMADol (ULTRAM) 50 MG tablet TAKE 1 TABLET BY MOUTH EVERY 6 HOURS as needed for pain 60 tablet 5   No facility-administered medications prior to visit.     ROS Review of  Systems  Constitutional: Negative for activity change, appetite change, chills, fatigue and unexpected weight change.  HENT: Negative for congestion, mouth sores and sinus pressure.   Eyes: Negative for visual disturbance.  Respiratory: Negative for cough and chest tightness.   Gastrointestinal: Negative for abdominal pain and nausea.  Genitourinary: Negative for difficulty urinating, frequency and vaginal pain.  Musculoskeletal: Positive for arthralgias. Negative for back pain and gait problem.  Skin: Negative for pallor and rash.  Neurological: Negative for dizziness, tremors, weakness, numbness and headaches.  Psychiatric/Behavioral: Negative for confusion, sleep disturbance and suicidal ideas.    Objective:  BP 126/78 (BP Location: Right Arm, Patient Position: Sitting, Cuff Size: Normal)   Pulse 85   Temp (!) 97.5 F (36.4 C) (Oral)   Ht 5\' 5"  (1.651 m)   Wt 122 lb (55.3 kg)   SpO2 98%   BMI 20.30 kg/m   BP Readings from Last 3 Encounters:  03/28/17 126/78  12/19/16 120/72  08/01/16 128/76    Wt Readings from Last 3 Encounters:  03/28/17 122 lb (55.3 kg)  12/19/16 125 lb (56.7 kg)  08/01/16 123 lb 12 oz (56.1 kg)    Physical Exam  Constitutional: She appears well-developed. No distress.  HENT:  Head: Normocephalic.  Right Ear: External  ear normal.  Left Ear: External ear normal.  Nose: Nose normal.  Mouth/Throat: Oropharynx is clear and moist.  Eyes: Conjunctivae are normal. Pupils are equal, round, and reactive to light. Right eye exhibits no discharge. Left eye exhibits no discharge.  Neck: Normal range of motion. Neck supple. No JVD present. No tracheal deviation present. No thyromegaly present.  Cardiovascular: Normal rate, regular rhythm and normal heart sounds.  Pulmonary/Chest: No stridor. No respiratory distress. She has no wheezes.  Abdominal: Soft. Bowel sounds are normal. She exhibits no distension and no mass. There is no tenderness. There is no rebound  and no guarding.  Musculoskeletal: She exhibits tenderness. She exhibits no edema.  Lymphadenopathy:    She has no cervical adenopathy.  Neurological: She displays normal reflexes. No cranial nerve deficit. She exhibits normal muscle tone. Coordination abnormal.  Skin: No rash noted. No erythema.  Psychiatric: She has a normal mood and affect. Her behavior is normal. Judgment and thought content normal.    Lab Results  Component Value Date   WBC 8.5 03/07/2016   HGB 12.3 03/07/2016   HCT 36.6 03/07/2016   PLT 268.0 03/07/2016   GLUCOSE 98 08/01/2016   CHOL 169 05/20/2012   TRIG 75.0 05/20/2012   HDL 66.60 05/20/2012   LDLDIRECT 125.4 05/10/2011   LDLCALC 87 05/20/2012   ALT 12 (L) 12/30/2014   AST 18 12/30/2014   NA 140 08/01/2016   K 4.0 08/01/2016   CL 105 08/01/2016   CREATININE 0.74 08/01/2016   BUN 19 08/01/2016   CO2 28 08/01/2016   TSH 3.83 08/01/2016   INR 1.07 12/30/2014   HGBA1C 5.6 09/11/2008    No results found.  Assessment & Plan:   There are no diagnoses linked to this encounter. I am having [name] maintain her fluticasone, metoprolol tartrate, bimatoprost, cyanocobalamin, Cholecalciferol, ranitidine, loratadine, estradiol, amoxicillin, LUMIGAN, clobetasol cream, amLODipine, clonazePAM, mirtazapine, gabapentin, and traMADol.  No orders of the defined types were placed in this encounter.    Follow-up: No Follow-up on file.  Walker Kehr, MD

## 2017-03-28 NOTE — Patient Instructions (Signed)
MC well w/Jill 

## 2017-03-28 NOTE — Assessment & Plan Note (Signed)
  On diet  

## 2017-05-05 DIAGNOSIS — I639 Cerebral infarction, unspecified: Secondary | ICD-10-CM

## 2017-05-05 HISTORY — DX: Cerebral infarction, unspecified: I63.9

## 2017-05-06 ENCOUNTER — Emergency Department (HOSPITAL_COMMUNITY): Payer: Medicare Other

## 2017-05-06 ENCOUNTER — Inpatient Hospital Stay (HOSPITAL_COMMUNITY)
Admission: EM | Admit: 2017-05-06 | Discharge: 2017-05-09 | DRG: 065 | Disposition: A | Discharge status: Rehabilitation Facility | Payer: Medicare Other | Attending: Internal Medicine | Admitting: Internal Medicine

## 2017-05-06 ENCOUNTER — Encounter (HOSPITAL_COMMUNITY): Payer: Self-pay | Admitting: Emergency Medicine

## 2017-05-06 ENCOUNTER — Other Ambulatory Visit: Payer: Self-pay

## 2017-05-06 ENCOUNTER — Inpatient Hospital Stay (HOSPITAL_COMMUNITY): Payer: Medicare Other

## 2017-05-06 DIAGNOSIS — I69319 Unspecified symptoms and signs involving cognitive functions following cerebral infarction: Secondary | ICD-10-CM | POA: Diagnosis not present

## 2017-05-06 DIAGNOSIS — W1830XA Fall on same level, unspecified, initial encounter: Secondary | ICD-10-CM | POA: Diagnosis present

## 2017-05-06 DIAGNOSIS — S199XXA Unspecified injury of neck, initial encounter: Secondary | ICD-10-CM | POA: Diagnosis not present

## 2017-05-06 DIAGNOSIS — J329 Chronic sinusitis, unspecified: Secondary | ICD-10-CM | POA: Diagnosis present

## 2017-05-06 DIAGNOSIS — E559 Vitamin D deficiency, unspecified: Secondary | ICD-10-CM | POA: Diagnosis present

## 2017-05-06 DIAGNOSIS — I639 Cerebral infarction, unspecified: Principal | ICD-10-CM

## 2017-05-06 DIAGNOSIS — I129 Hypertensive chronic kidney disease with stage 1 through stage 4 chronic kidney disease, or unspecified chronic kidney disease: Secondary | ICD-10-CM | POA: Diagnosis present

## 2017-05-06 DIAGNOSIS — D62 Acute posthemorrhagic anemia: Secondary | ICD-10-CM

## 2017-05-06 DIAGNOSIS — S0990XA Unspecified injury of head, initial encounter: Secondary | ICD-10-CM | POA: Diagnosis not present

## 2017-05-06 DIAGNOSIS — R29810 Facial weakness: Secondary | ICD-10-CM

## 2017-05-06 DIAGNOSIS — I6350 Cerebral infarction due to unspecified occlusion or stenosis of unspecified cerebral artery: Secondary | ICD-10-CM | POA: Diagnosis not present

## 2017-05-06 DIAGNOSIS — R296 Repeated falls: Secondary | ICD-10-CM | POA: Diagnosis present

## 2017-05-06 DIAGNOSIS — Z7951 Long term (current) use of inhaled steroids: Secondary | ICD-10-CM | POA: Diagnosis not present

## 2017-05-06 DIAGNOSIS — R269 Unspecified abnormalities of gait and mobility: Secondary | ICD-10-CM | POA: Diagnosis not present

## 2017-05-06 DIAGNOSIS — I10 Essential (primary) hypertension: Secondary | ICD-10-CM | POA: Diagnosis not present

## 2017-05-06 DIAGNOSIS — H9190 Unspecified hearing loss, unspecified ear: Secondary | ICD-10-CM | POA: Diagnosis present

## 2017-05-06 DIAGNOSIS — R1310 Dysphagia, unspecified: Secondary | ICD-10-CM | POA: Diagnosis present

## 2017-05-06 DIAGNOSIS — Z96641 Presence of right artificial hip joint: Secondary | ICD-10-CM | POA: Diagnosis present

## 2017-05-06 DIAGNOSIS — Z7982 Long term (current) use of aspirin: Secondary | ICD-10-CM | POA: Diagnosis not present

## 2017-05-06 DIAGNOSIS — I69322 Dysarthria following cerebral infarction: Secondary | ICD-10-CM | POA: Diagnosis not present

## 2017-05-06 DIAGNOSIS — I69354 Hemiplegia and hemiparesis following cerebral infarction affecting left non-dominant side: Secondary | ICD-10-CM | POA: Diagnosis not present

## 2017-05-06 DIAGNOSIS — I6302 Cerebral infarction due to thrombosis of basilar artery: Secondary | ICD-10-CM | POA: Diagnosis not present

## 2017-05-06 DIAGNOSIS — M659 Unspecified synovitis and tenosynovitis, unspecified site: Secondary | ICD-10-CM

## 2017-05-06 DIAGNOSIS — M1610 Unilateral primary osteoarthritis, unspecified hip: Secondary | ICD-10-CM | POA: Diagnosis present

## 2017-05-06 DIAGNOSIS — R531 Weakness: Secondary | ICD-10-CM

## 2017-05-06 DIAGNOSIS — R402142 Coma scale, eyes open, spontaneous, at arrival to emergency department: Secondary | ICD-10-CM | POA: Diagnosis present

## 2017-05-06 DIAGNOSIS — M6289 Other specified disorders of muscle: Secondary | ICD-10-CM

## 2017-05-06 DIAGNOSIS — M533 Sacrococcygeal disorders, not elsewhere classified: Secondary | ICD-10-CM | POA: Diagnosis not present

## 2017-05-06 DIAGNOSIS — S064X0A Epidural hemorrhage without loss of consciousness, initial encounter: Secondary | ICD-10-CM | POA: Diagnosis not present

## 2017-05-06 DIAGNOSIS — S0993XA Unspecified injury of face, initial encounter: Secondary | ICD-10-CM | POA: Diagnosis not present

## 2017-05-06 DIAGNOSIS — M79642 Pain in left hand: Secondary | ICD-10-CM | POA: Diagnosis present

## 2017-05-06 DIAGNOSIS — E785 Hyperlipidemia, unspecified: Secondary | ICD-10-CM | POA: Diagnosis present

## 2017-05-06 DIAGNOSIS — I361 Nonrheumatic tricuspid (valve) insufficiency: Secondary | ICD-10-CM | POA: Diagnosis not present

## 2017-05-06 DIAGNOSIS — D509 Iron deficiency anemia, unspecified: Secondary | ICD-10-CM | POA: Diagnosis not present

## 2017-05-06 DIAGNOSIS — Z9109 Other allergy status, other than to drugs and biological substances: Secondary | ICD-10-CM

## 2017-05-06 DIAGNOSIS — R000 Tachycardia, unspecified: Secondary | ICD-10-CM | POA: Diagnosis not present

## 2017-05-06 DIAGNOSIS — I69391 Dysphagia following cerebral infarction: Secondary | ICD-10-CM

## 2017-05-06 DIAGNOSIS — I6523 Occlusion and stenosis of bilateral carotid arteries: Secondary | ICD-10-CM | POA: Diagnosis not present

## 2017-05-06 DIAGNOSIS — G8194 Hemiplegia, unspecified affecting left nondominant side: Secondary | ICD-10-CM | POA: Diagnosis present

## 2017-05-06 DIAGNOSIS — S3210XA Unspecified fracture of sacrum, initial encounter for closed fracture: Secondary | ICD-10-CM | POA: Diagnosis present

## 2017-05-06 DIAGNOSIS — F411 Generalized anxiety disorder: Secondary | ICD-10-CM | POA: Diagnosis present

## 2017-05-06 DIAGNOSIS — M810 Age-related osteoporosis without current pathological fracture: Secondary | ICD-10-CM | POA: Diagnosis present

## 2017-05-06 DIAGNOSIS — M7989 Other specified soft tissue disorders: Secondary | ICD-10-CM | POA: Diagnosis not present

## 2017-05-06 DIAGNOSIS — E538 Deficiency of other specified B group vitamins: Secondary | ICD-10-CM | POA: Diagnosis present

## 2017-05-06 DIAGNOSIS — M169 Osteoarthritis of hip, unspecified: Secondary | ICD-10-CM | POA: Diagnosis present

## 2017-05-06 DIAGNOSIS — E890 Postprocedural hypothyroidism: Secondary | ICD-10-CM | POA: Diagnosis present

## 2017-05-06 DIAGNOSIS — M25532 Pain in left wrist: Secondary | ICD-10-CM | POA: Diagnosis not present

## 2017-05-06 DIAGNOSIS — G479 Sleep disorder, unspecified: Secondary | ICD-10-CM | POA: Diagnosis not present

## 2017-05-06 DIAGNOSIS — N951 Menopausal and female climacteric states: Secondary | ICD-10-CM | POA: Diagnosis present

## 2017-05-06 DIAGNOSIS — S0011XA Contusion of right eyelid and periocular area, initial encounter: Secondary | ICD-10-CM | POA: Diagnosis present

## 2017-05-06 DIAGNOSIS — N189 Chronic kidney disease, unspecified: Secondary | ICD-10-CM | POA: Diagnosis present

## 2017-05-06 DIAGNOSIS — M1612 Unilateral primary osteoarthritis, left hip: Secondary | ICD-10-CM | POA: Diagnosis not present

## 2017-05-06 DIAGNOSIS — Z8711 Personal history of peptic ulcer disease: Secondary | ICD-10-CM

## 2017-05-06 DIAGNOSIS — N183 Chronic kidney disease, stage 3 (moderate): Secondary | ICD-10-CM | POA: Diagnosis not present

## 2017-05-06 DIAGNOSIS — Z79899 Other long term (current) drug therapy: Secondary | ICD-10-CM

## 2017-05-06 DIAGNOSIS — N959 Unspecified menopausal and perimenopausal disorder: Secondary | ICD-10-CM | POA: Diagnosis present

## 2017-05-06 DIAGNOSIS — R29702 NIHSS score 2: Secondary | ICD-10-CM | POA: Diagnosis present

## 2017-05-06 DIAGNOSIS — I951 Orthostatic hypotension: Secondary | ICD-10-CM | POA: Diagnosis not present

## 2017-05-06 DIAGNOSIS — I69351 Hemiplegia and hemiparesis following cerebral infarction affecting right dominant side: Secondary | ICD-10-CM | POA: Diagnosis not present

## 2017-05-06 DIAGNOSIS — Z79890 Hormone replacement therapy (postmenopausal): Secondary | ICD-10-CM

## 2017-05-06 DIAGNOSIS — R402252 Coma scale, best verbal response, oriented, at arrival to emergency department: Secondary | ICD-10-CM | POA: Diagnosis present

## 2017-05-06 DIAGNOSIS — R52 Pain, unspecified: Secondary | ICD-10-CM

## 2017-05-06 DIAGNOSIS — R402362 Coma scale, best motor response, obeys commands, at arrival to emergency department: Secondary | ICD-10-CM | POA: Diagnosis present

## 2017-05-06 DIAGNOSIS — I69398 Other sequelae of cerebral infarction: Secondary | ICD-10-CM | POA: Diagnosis not present

## 2017-05-06 HISTORY — DX: Acute pyelonephritis: N10

## 2017-05-06 HISTORY — DX: Vitamin B12 deficiency anemia, unspecified: D51.9

## 2017-05-06 HISTORY — DX: Headache, unspecified: R51.9

## 2017-05-06 HISTORY — DX: Iron deficiency anemia, unspecified: D50.9

## 2017-05-06 HISTORY — DX: Cerebral infarction, unspecified: I63.9

## 2017-05-06 HISTORY — DX: Headache: R51

## 2017-05-06 HISTORY — DX: Other seasonal allergic rhinitis: J30.2

## 2017-05-06 HISTORY — DX: Unspecified osteoarthritis, unspecified site: M19.90

## 2017-05-06 LAB — DIFFERENTIAL
BASOS ABS: 0 10*3/uL (ref 0.0–0.1)
BASOS PCT: 0 %
Eosinophils Absolute: 0.2 10*3/uL (ref 0.0–0.7)
Eosinophils Relative: 2 %
LYMPHS ABS: 1.1 10*3/uL (ref 0.7–4.0)
LYMPHS PCT: 12 %
MONO ABS: 0.7 10*3/uL (ref 0.1–1.0)
MONOS PCT: 8 %
NEUTROS ABS: 7.1 10*3/uL (ref 1.7–7.7)
Neutrophils Relative %: 78 %

## 2017-05-06 LAB — PROTIME-INR
INR: 1.01
Prothrombin Time: 13.2 seconds (ref 11.4–15.2)

## 2017-05-06 LAB — URINALYSIS, ROUTINE W REFLEX MICROSCOPIC
Bacteria, UA: NONE SEEN
Bilirubin Urine: NEGATIVE
GLUCOSE, UA: NEGATIVE mg/dL
Hgb urine dipstick: NEGATIVE
Ketones, ur: NEGATIVE mg/dL
Leukocytes, UA: NEGATIVE
Nitrite: NEGATIVE
PROTEIN: NEGATIVE mg/dL
RBC / HPF: NONE SEEN RBC/hpf (ref 0–5)
SPECIFIC GRAVITY, URINE: 1.006 (ref 1.005–1.030)
SQUAMOUS EPITHELIAL / LPF: NONE SEEN
pH: 5 (ref 5.0–8.0)

## 2017-05-06 LAB — I-STAT CHEM 8, ED
BUN: 10 mg/dL (ref 6–20)
CALCIUM ION: 1.15 mmol/L (ref 1.15–1.40)
CREATININE: 0.6 mg/dL (ref 0.44–1.00)
Chloride: 106 mmol/L (ref 101–111)
GLUCOSE: 108 mg/dL — AB (ref 65–99)
HCT: 36 % (ref 36.0–46.0)
HEMOGLOBIN: 12.2 g/dL (ref 12.0–15.0)
POTASSIUM: 3.6 mmol/L (ref 3.5–5.1)
Sodium: 143 mmol/L (ref 135–145)
TCO2: 24 mmol/L (ref 22–32)

## 2017-05-06 LAB — CBC
HEMATOCRIT: 36.7 % (ref 36.0–46.0)
HEMOGLOBIN: 12.2 g/dL (ref 12.0–15.0)
MCH: 29.2 pg (ref 26.0–34.0)
MCHC: 33.2 g/dL (ref 30.0–36.0)
MCV: 87.8 fL (ref 78.0–100.0)
Platelets: 257 10*3/uL (ref 150–400)
RBC: 4.18 MIL/uL (ref 3.87–5.11)
RDW: 12.9 % (ref 11.5–15.5)
WBC: 9.1 10*3/uL (ref 4.0–10.5)

## 2017-05-06 LAB — COMPREHENSIVE METABOLIC PANEL
ALK PHOS: 80 U/L (ref 38–126)
ALT: 14 U/L (ref 14–54)
AST: 19 U/L (ref 15–41)
Albumin: 3.5 g/dL (ref 3.5–5.0)
Anion gap: 11 (ref 5–15)
BILIRUBIN TOTAL: 0.9 mg/dL (ref 0.3–1.2)
BUN: 10 mg/dL (ref 6–20)
CALCIUM: 9.2 mg/dL (ref 8.9–10.3)
CHLORIDE: 104 mmol/L (ref 101–111)
CO2: 25 mmol/L (ref 22–32)
CREATININE: 0.79 mg/dL (ref 0.44–1.00)
Glucose, Bld: 111 mg/dL — ABNORMAL HIGH (ref 65–99)
Potassium: 3.5 mmol/L (ref 3.5–5.1)
Sodium: 140 mmol/L (ref 135–145)
Total Protein: 7.5 g/dL (ref 6.5–8.1)

## 2017-05-06 LAB — RAPID URINE DRUG SCREEN, HOSP PERFORMED
Amphetamines: NOT DETECTED
Barbiturates: NOT DETECTED
Benzodiazepines: NOT DETECTED
Cocaine: NOT DETECTED
OPIATES: NOT DETECTED
Tetrahydrocannabinol: NOT DETECTED

## 2017-05-06 LAB — I-STAT TROPONIN, ED: TROPONIN I, POC: 0.01 ng/mL (ref 0.00–0.08)

## 2017-05-06 LAB — APTT: APTT: 30 s (ref 24–36)

## 2017-05-06 LAB — TROPONIN I

## 2017-05-06 MED ORDER — VITAMIN D 1000 UNITS PO TABS
2000.0000 [IU] | ORAL_TABLET | Freq: Every day | ORAL | Status: DC
  Administered 2017-05-07 – 2017-05-08 (×2): 2000 [IU] via ORAL
  Filled 2017-05-06 (×3): qty 2

## 2017-05-06 MED ORDER — CYANOCOBALAMIN 500 MCG PO TABS
500.0000 ug | ORAL_TABLET | Freq: Every day | ORAL | Status: DC
  Administered 2017-05-07 – 2017-05-09 (×3): 500 ug via ORAL
  Filled 2017-05-06 (×3): qty 1

## 2017-05-06 MED ORDER — ASPIRIN EC 81 MG PO TBEC: 81.0000 mg | DELAYED_RELEASE_TABLET | Freq: Every day | ORAL | Status: DC

## 2017-05-06 MED ORDER — MIRTAZAPINE 15 MG PO TABS
15.0000 mg | ORAL_TABLET | Freq: Every day | ORAL | Status: DC
  Administered 2017-05-07 – 2017-05-08 (×2): 15 mg via ORAL
  Filled 2017-05-06 (×3): qty 1

## 2017-05-06 MED ORDER — LORAZEPAM 2 MG/ML IJ SOLN
0.5000 mg | Freq: Once | INTRAMUSCULAR | Status: AC
  Administered 2017-05-06: 0.5 mg via INTRAVENOUS
  Filled 2017-05-06: qty 1

## 2017-05-06 MED ORDER — AMLODIPINE BESYLATE 5 MG PO TABS: 2.5000 mg | ORAL_TABLET | Freq: Every day | ORAL | Status: DC

## 2017-05-06 MED ORDER — ATORVASTATIN CALCIUM 80 MG PO TABS: 80.0000 mg | ORAL_TABLET | Freq: Every day | ORAL | Status: DC

## 2017-05-06 MED ORDER — SODIUM CHLORIDE 0.9 % IV SOLN
INTRAVENOUS | Status: DC
  Administered 2017-05-06: 23:00:00 via INTRAVENOUS

## 2017-05-06 MED ORDER — TRAMADOL HCL 50 MG PO TABS: 50.0000 mg | ORAL_TABLET | Freq: Four times a day (QID) | ORAL | Status: DC | PRN

## 2017-05-06 MED ORDER — HYDRALAZINE HCL 20 MG/ML IJ SOLN: 5.0000 mg | Freq: Three times a day (TID) | INTRAMUSCULAR | Status: DC | PRN

## 2017-05-06 MED ORDER — STROKE: EARLY STAGES OF RECOVERY BOOK
Freq: Once | Status: AC
  Administered 2017-05-07: 06:00:00
  Filled 2017-05-06 (×2): qty 1

## 2017-05-06 MED ORDER — ACETAMINOPHEN 325 MG PO TABS
650.0000 mg | ORAL_TABLET | Freq: Once | ORAL | Status: AC
  Administered 2017-05-06: 650 mg via ORAL
  Filled 2017-05-06: qty 2

## 2017-05-06 MED ORDER — CLOPIDOGREL BISULFATE 75 MG PO TABS
75.0000 mg | ORAL_TABLET | Freq: Every day | ORAL | Status: DC
  Administered 2017-05-07 – 2017-05-09 (×3): 75 mg via ORAL
  Filled 2017-05-06 (×3): qty 1

## 2017-05-06 MED ORDER — SODIUM CHLORIDE 0.9 % IV BOLUS (SEPSIS)
1000.0000 mL | Freq: Once | INTRAVENOUS | Status: AC
  Administered 2017-05-06: 1000 mL via INTRAVENOUS

## 2017-05-06 MED ORDER — HYDROCODONE-ACETAMINOPHEN 7.5-325 MG PO TABS
1.0000 | ORAL_TABLET | Freq: Four times a day (QID) | ORAL | Status: DC | PRN
  Administered 2017-05-06 – 2017-05-08 (×4): 1 via ORAL
  Filled 2017-05-06 (×5): qty 1

## 2017-05-06 MED ORDER — GABAPENTIN 100 MG PO CAPS
100.0000 mg | ORAL_CAPSULE | Freq: Every day | ORAL | Status: DC
  Administered 2017-05-07 – 2017-05-08 (×2): 100 mg via ORAL
  Filled 2017-05-06 (×3): qty 1

## 2017-05-06 MED ORDER — SENNOSIDES-DOCUSATE SODIUM 8.6-50 MG PO TABS: 1.0000 | ORAL_TABLET | Freq: Every evening | ORAL | Status: DC | PRN

## 2017-05-06 MED ORDER — AMLODIPINE BESYLATE 2.5 MG PO TABS
2.5000 mg | ORAL_TABLET | Freq: Every day | ORAL | Status: DC
  Administered 2017-05-08 – 2017-05-09 (×2): 2.5 mg via ORAL
  Filled 2017-05-06 (×2): qty 1

## 2017-05-06 MED ORDER — HEPARIN SODIUM (PORCINE) 5000 UNIT/ML IJ SOLN
5000.0000 [IU] | Freq: Three times a day (TID) | INTRAMUSCULAR | Status: DC
  Administered 2017-05-06 – 2017-05-09 (×8): 5000 [IU] via SUBCUTANEOUS
  Filled 2017-05-06 (×7): qty 1

## 2017-05-06 MED ORDER — GADOBENATE DIMEGLUMINE 529 MG/ML IV SOLN
10.0000 mL | Freq: Once | INTRAVENOUS | Status: AC | PRN
  Administered 2017-05-06: 10 mL via INTRAVENOUS

## 2017-05-06 MED ORDER — CLONAZEPAM 0.25 MG PO TBDP
0.2500 mg | ORAL_TABLET | Freq: Two times a day (BID) | ORAL | Status: DC | PRN
  Administered 2017-05-08 – 2017-05-09 (×2): 0.25 mg via ORAL
  Filled 2017-05-06 (×2): qty 1

## 2017-05-06 NOTE — ED Notes (Signed)
Pt not in room still, room has 5 visitors in it, politely asked visitors to lower the noise and shut the door as they are disturbing pt's that are trying to rest.

## 2017-05-06 NOTE — ED Notes (Signed)
Sent message to pharmacy to verify and adjust times of pending medications

## 2017-05-06 NOTE — ED Notes (Signed)
The pt reports that she feels very tired  Iv bag empty

## 2017-05-06 NOTE — Consult Note (Signed)
Neurology Consultation  Reason for Consult: Left facial weakness, falls Referring Physician: Dr. Jeanell Sparrow  CC: Left facial weakness, falls  History is obtained from: Patient, daughter is at bedside  HPI: Theresa Hampton is a 83 y.o. left-handed female who has a past medical history of hypertension, hyperlipidemia, peptic ulcer disease, vitamin D and B12 deficiency, documented chronic kidney disease on the chart, who was in her usual sort of health to the best of family's knowledge about 2 days ago when she started having some difficulty walking and falls.  She had one fall yesterday.  This morning she was seen by her daughter, who lives nearby.  The daughter noticed that the patient had to be angle of the mouth on the left side.  She was brought into the emergency room for evaluation of concern for stroke. On the initial ER evaluation, she did have the left facial weakness but was grossly otherwise nonfocal. Noncontrast CT of the head did not reveal any acute abnormality. Patient was sent in for an MRI of the brain and a neurology consult was obtained.  LKW: 7 AM on 05/04/2017 tpa given?: no, outside the window Premorbid modified Rankin scale (mRS): 3  ROS: Review of Systems  Constitutional: Negative for chills and fever.  HENT: Positive for hearing loss. Negative for ear pain.   Eyes: Negative for blurred vision and double vision.  Respiratory: Negative for cough and hemoptysis.   Cardiovascular: Negative for chest pain and palpitations.  Gastrointestinal: Negative for heartburn, nausea and vomiting.  Genitourinary: Negative for dysuria and urgency.  Musculoskeletal: Negative for myalgias and neck pain.  Skin: Negative for itching and rash.  Neurological: Negative for dizziness, tingling and headaches.  Endo/Heme/Allergies: Negative for environmental allergies. Does not bruise/bleed easily.     Past Medical History:  Diagnosis Date  . Allergic rhinitis   . Anemia    iron deficiency   . Chronic kidney disease    Pyelo 2014  . Chronic sinusitis   . Complication of anesthesia   . History of transfusion   . Hyperlipidemia   . Hypertension   . Leg cramps   . Osteoarthritis    Dr. Maureen Ralphs  . Osteoporosis   . Peptic ulcer disease 2003  . PONV (postoperative nausea and vomiting)   . Seizures (HCC)    x1 with Lidocaine  . Vitamin B12 deficiency   . Vitamin D deficiency    Family History  Problem Relation Age of Onset  . Arthritis Mother   . Hypertension Unknown   . Cancer Sister    Social History:   reports that  has never smoked. she has never used smokeless tobacco. She reports that she does not drink alcohol or use drugs.  Medications No current facility-administered medications for this encounter.   Current Outpatient Medications:  .  amLODipine (NORVASC) 2.5 MG tablet, TAKE 1 TABLET (2.5 MG TOTAL) BY MOUTH DAILY., Disp: 90 tablet, Rfl: 1 .  aspirin EC 81 MG tablet, Take 81 mg by mouth daily., Disp: , Rfl:  .  Cholecalciferol 2000 UNITS TABS, Take 2 each by mouth at bedtime., Disp: , Rfl:  .  clonazePAM (KLONOPIN) 0.25 MG disintegrating tablet, DISSOLVE ONE TABLET BY MOUTH TWICE A DAY AS NEEDED FOR ANXIETY, Disp: 60 tablet, Rfl: 2 .  cyanocobalamin 500 MCG tablet, Take 500 mcg by mouth daily., Disp: , Rfl:  .  estradiol (CLIMARA - DOSED IN MG/24 HR) 0.05 mg/24hr patch, Place 1 patch (0.05 mg total) onto the skin once a  week., Disp: 12 patch, Rfl: 1 .  fluticasone (FLONASE) 50 MCG/ACT nasal spray, Place 1 spray into the nose daily., Disp: 16 g, Rfl: 3 .  gabapentin (NEURONTIN) 100 MG capsule, TAKE 1 CAPSULE (100 MG TOTAL) BY MOUTH AT BEDTIME., Disp: 90 capsule, Rfl: 1 .  loratadine (CLARITIN) 10 MG tablet, Take 1 tablet (10 mg total) by mouth daily., Disp: 100 tablet, Rfl: 2 .  LUMIGAN 0.01 % SOLN, instill 1 drop IN EACH EYE AT BEDTIME, Disp: , Rfl: 3 .  mirtazapine (REMERON) 15 MG tablet, TAKE ONE TABLET BY MOUTH EVERY EVENING BEFORE DINNER AT 4 - 5 PM, Disp:  90 tablet, Rfl: 1 .  traMADol (ULTRAM) 50 MG tablet, TAKE 1 TABLET BY MOUTH EVERY 6 HOURS as needed for pain, Disp: 60 tablet, Rfl: 5 .  clobetasol cream (TEMOVATE) 5.46 %, Apply 1 application topically 2 (two) times daily. Use sparingly. (Patient not taking: Reported on 05/06/2017), Disp: 30 g, Rfl: 2 .  metoprolol tartrate (LOPRESSOR) 25 MG tablet, Take 1 tablet (25 mg total) by mouth 2 (two) times daily. (Patient not taking: Reported on 05/06/2017), Disp: 180 tablet, Rfl: 3 .  ranitidine (ZANTAC) 150 MG tablet, Take 1 tablet (150 mg total) by mouth at bedtime. (Patient not taking: Reported on 05/06/2017), Disp: 30 tablet, Rfl: 5  Exam: Current vital signs: BP (!) 159/80 (BP Location: Right Arm)   Pulse 95   Temp 98.5 F (36.9 C) (Oral)   Resp 13   Ht 5\' 5"  (1.651 m)   Wt 52.2 kg (115 lb)   SpO2 96%   BMI 19.14 kg/m  Vital signs in last 24 hours: Temp:  [97.9 F (36.6 C)-98.5 F (36.9 C)] 98.5 F (36.9 C) (12/30 1257) Pulse Rate:  [90-102] 95 (12/30 1257) Resp:  [11-21] 13 (12/30 1257) BP: (141-193)/(67-88) 159/80 (12/30 1257) SpO2:  [96 %-97 %] 96 % (12/30 1257) Weight:  [52.2 kg (115 lb)] 52.2 kg (115 lb) (12/30 0849) General: Awake alert in no apparent distress HEENT: Normocephalic atraumatic dry mucous membranes Chest: Clear to auscultation bilaterally Cardiovascular: S1-S2 heard, regular rate rhythm, no murmur rub gallop Abdomen: Soft nondistended nontender Extremities: Warm well perfused Neurological exam Awake alert oriented x3 Speech is mildly dysarthric Naming, repetition and calm prevention intact. Cranial nerves: Pupils equal round reactive to light, extraocular movements intact, visual fields full, facial examination shows left lower face weakness.  Auditory acuity reduced to conversational hearing bilaterally, palate elevates symmetrically, tongue midline. Motor exam: 5/5 right upper and right lower extremity.  4+/5 left upper extremity, 4+/5 left lower  extremity.  Mild vertical drift in the left leg.  No vertical drift in other 3 extremities. Sensory exam: Intact to light touch all over. Coordination: Intact finger-nose-finger bilaterally Gait testing was deferred at this time. NIHSS 1a Level of Conscious.: 0 1b LOC Questions: 0 1c LOC Commands: 0 2 Best Gaze: 0 3 Visual: 0 4 Facial Palsy: 1 5a Motor Arm - left: 0 5b Motor Arm - Right: 0 6a Motor Leg - Left: 1 6b Motor Leg - Right: 0 7 Limb Ataxia: 0 8 Sensory: 0 9 Best Language: 0 10 Dysarthria: 1 11 Extinct. and Inatten.: 0 TOTAL: 3  Labs I have reviewed labs in epic and the results pertinent to this consultation are: CBC    Component Value Date/Time   WBC 9.1 05/06/2017 0857   RBC 4.18 05/06/2017 0857   HGB 12.2 05/06/2017 0916   HCT 36.0 05/06/2017 0916   PLT 257 05/06/2017 0857  MCV 87.8 05/06/2017 0857   MCH 29.2 05/06/2017 0857   MCHC 33.2 05/06/2017 0857   RDW 12.9 05/06/2017 0857   LYMPHSABS 1.1 05/06/2017 0857   MONOABS 0.7 05/06/2017 0857   EOSABS 0.2 05/06/2017 0857   BASOSABS 0.0 05/06/2017 0857   CMP     Component Value Date/Time   NA 143 05/06/2017 0916   K 3.6 05/06/2017 0916   CL 106 05/06/2017 0916   CO2 25 05/06/2017 0857   GLUCOSE 108 (H) 05/06/2017 0916   BUN 10 05/06/2017 0916   CREATININE 0.60 05/06/2017 0916   CREATININE 0.82 06/02/2013 0919   CALCIUM 9.2 05/06/2017 0857   PROT 7.5 05/06/2017 0857   ALBUMIN 3.5 05/06/2017 0857   AST 19 05/06/2017 0857   ALT 14 05/06/2017 0857   ALKPHOS 80 05/06/2017 0857   BILITOT 0.9 05/06/2017 0857   GFRNONAA >60 05/06/2017 0857   GFRNONAA 63 06/02/2013 0919   GFRAA >60 05/06/2017 0857   GFRAA 72 06/02/2013 0919    Lipid Panel     Component Value Date/Time   CHOL 169 05/20/2012 0839   TRIG 75.0 05/20/2012 0839   HDL 66.60 05/20/2012 0839   CHOLHDL 3 05/20/2012 0839   VLDL 15.0 05/20/2012 0839   LDLCALC 87 05/20/2012 0839   LDLDIRECT 125.4 05/10/2011 1058   Imaging I have  reviewed the images obtained: CT-scan of the brain -no acute changes. MRI examination of the brain -right paramedian pons restricted diffusion.  Assessment:  83 year old left-handed woman with a past history of hypertension hyperlipidemia peptic ulcer disease vitamin D and B12 deficiency with 2 days worth of left facial droop and falls. On examination she has left facial droop, mild dysarthria and mild left hemiparesis. MRI exam shows a right paramedian pons area of restricted diffusion. Most likely small vessel stroke.  Impression: Acute ischemic stroke-right pons  Recommendations: -Admit to hospitalist or observation -Telemetry monitoring -Allow for permissive hypertension for the first 24-48h - only treat PRN if SBP >220 mmHg. Blood pressures can be gradually normalized to SBP<140 upon discharge. MRA  of the brain without contrast -Echocardiogram -HgbA1c, fasting lipid panel -Frequent neuro checks -Prophylactic therapy-Antiplatelet med: Aspirin at home.  Changed to Plavix 75. -Atorvastatin 80 mg PO daily -Risk factor modification -PT consult, OT consult, Speech consult  Please page stroke NP/PA/MD (listed on AMION)  from 8am-4 pm as this patient will be followed by the stroke team at this point.  -- Amie Portland, MD [facility] [phone] If 7pm to 7am, please call on call as listed on AMION.

## 2017-05-06 NOTE — ED Notes (Signed)
To mri 

## 2017-05-06 NOTE — ED Notes (Signed)
Paged Dr. Shawn Route for diet recomendations

## 2017-05-06 NOTE — ED Notes (Signed)
Going to pod f rm 2 after mra

## 2017-05-06 NOTE — ED Notes (Signed)
Purwick applied to pt.

## 2017-05-06 NOTE — ED Triage Notes (Signed)
Patient from home with [facility] for left sided weakness. Patient last seen well at 1730 last night, patient states she has been feeling generally weak for the last few days and had been using her walker to get around her home where she lives alone. While walking in her kitchen she says she tripped over her walked and fell backwards onto her buttocks. Denies LOC, states she hit the back of her head on floor, bruising around left eye, denies hitting front of head but states bruising occurred after fall. Patient is alert and oriented at this time. Left sided weakness, minor facial droop, no slurred speech. 18g saline lock in right AC.

## 2017-05-06 NOTE — ED Notes (Signed)
MD Aurora at the bedside

## 2017-05-06 NOTE — ED Provider Notes (Signed)
[facility] EMERGENCY DEPARTMENT Provider Note   CSN: [id number] Arrival date & time: 05/06/17  3295     History   Chief Complaint Chief Complaint  Patient presents with  . Weakness    HPI Theresa Hampton is a 83 y.o. female who presents with a fall and stroke symptoms. PMH significant for arthritis, HTN, HLD, osteoporosis. She states that she lives alone but has a nurse who comes almost every day and her daughters live nearby. She is normally ambulatory - sometimes with a walker. Over the past couple days she has felt generally weak and has been using her walker more consistently. She denies any specific symptoms. Yesterday at about 5PM she had a mechanical fall when she thinks her feet got tangled up in the walker and she fell backwards and landed on her buttocks and hit her head on the floor. She called her daughters who stayed with her last night. This morning they noted facial droop and she was weaker on the right side. She also developed bruising around the right eye but the patient denies she hit her face. She is not on blood thinners. She denies significant weakness, vision changes, numbness/tingling, chest pain, SOB, abdominal pain, vomiting, urinary symptoms. She has been eating and drinking well.  After family is at bedside, they state that they noticed the facial droop and weakness after the fall however it was more intermittent. She has been ambulatory after the fall. They also think she may be dehydrated because she had some diarrhea a couple days ago and has been "on the go" during the holidays.  HPI  Past Medical History:  Diagnosis Date  . Allergic rhinitis   . Anemia    iron deficiency  . Chronic kidney disease    Pyelo 2014  . Chronic sinusitis   . Complication of anesthesia   . History of transfusion   . Hyperlipidemia   . Hypertension   . Leg cramps   . Osteoarthritis    Dr. Maureen Ralphs  . Osteoporosis   . Peptic ulcer disease 2003  . PONV  (postoperative nausea and vomiting)   . Seizures (HCC)    x1 with Lidocaine  . Vitamin B12 deficiency   . Vitamin D deficiency     Patient Active Problem List   Diagnosis Date Noted  . Fatigue 07/29/2015  . Acute sinusitis 03/10/2015  . OA (osteoarthritis) of hip 01/06/2015  . Preop exam for internal medicine 12/16/2014  . HTN (hypertension) 06/09/2014  . Grief 06/09/2014  . Hypokalemia 06/05/2014  . Menopausal disorder 04/07/2014  . Acute pyelonephritis 04/09/2013  . Generalized anxiety disorder 04/09/2013  . Nausea alone 04/07/2013  . Tachycardia 04/07/2013  . Abdominal bloating 04/07/2013  . Neuropathic pain 11/20/2012  . URI, acute 08/21/2012  . Hip pain, left 11/17/2011  . Wrist pain, right 10/27/2010  . Weakness of both legs 10/27/2010  . Cramp of limb 07/09/2009  . B12 deficiency 09/16/2008  . EYE PAIN 02/07/2008  . Sinusitis, chronic 02/07/2008  . Allergic rhinitis 08/06/2007  . Left carotid bruit 08/06/2007  . Vitamin D deficiency 04/26/2007  . PEPTIC ULCER DISEASE 04/26/2007  . Osteoarthritis 04/26/2007  . Headache 04/26/2007  . Hyperlipidemia 01/11/2007  . ANEMIA-IRON DEFICIENCY 01/11/2007  . GASTRIC ULCER, ACUTE, HEMORRHAGE 01/11/2007  . DUODENITIS 01/11/2007    Past Surgical History:  Procedure Laterality Date  . JOINT REPLACEMENT  2004   rt total hip  . THYROIDECTOMY, PARTIAL  "many years ago"  . TOTAL HIP  ARTHROPLASTY Left 01/06/2015   Procedure: LEFT TOTAL HIP ARTHROPLASTY ANTERIOR APPROACH;  Surgeon: Gaynelle Arabian, MD;  Location: [facility];  Service: Orthopedics;  Laterality: Left;  . uterine ablation  2008    OB History    No data available       Home Medications    Prior to Admission medications   Medication Sig Start Date End Date Taking? Authorizing Provider  amLODipine (NORVASC) 2.5 MG tablet TAKE 1 TABLET (2.5 MG TOTAL) BY MOUTH DAILY. 12/14/16   Plotnikov, Evie Lacks, MD  amoxicillin (AMOXIL) 500 MG capsule Take 1 capsule (500 mg total)  by mouth 3 (three) times daily. 05/29/16   Plotnikov, Evie Lacks, MD  bimatoprost (LUMIGAN) 0.03 % ophthalmic solution 1 drop at bedtime.    [provider]  Cholecalciferol 2000 UNITS TABS Take 2 each by mouth at bedtime.    [provider]  clobetasol cream (TEMOVATE) 5.05 % Apply 1 application topically 2 (two) times daily. Use sparingly. 08/25/16   Plotnikov, Evie Lacks, MD  clonazePAM (KLONOPIN) 0.25 MG disintegrating tablet DISSOLVE ONE TABLET BY MOUTH TWICE A DAY AS NEEDED FOR ANXIETY 01/17/17   Plotnikov, Evie Lacks, MD  cyanocobalamin 500 MCG tablet Take 500 mcg by mouth daily.    [provider]  estradiol (CLIMARA - DOSED IN MG/24 HR) 0.05 mg/24hr patch Place 1 patch (0.05 mg total) onto the skin once a week. 04/20/16   Plotnikov, Evie Lacks, MD  fluticasone (FLONASE) 50 MCG/ACT nasal spray Place 1 spray into the nose daily. 08/21/12   Plotnikov, Evie Lacks, MD  gabapentin (NEURONTIN) 100 MG capsule TAKE 1 CAPSULE (100 MG TOTAL) BY MOUTH AT BEDTIME. 02/12/17   Plotnikov, Evie Lacks, MD  loratadine (CLARITIN) 10 MG tablet Take 1 tablet (10 mg total) by mouth daily. 04/16/15   Plotnikov, Evie Lacks, MD  LUMIGAN 0.01 % SOLN instill 1 drop IN [facility] EYE AT BEDTIME 06/05/16   [provider]  metoprolol tartrate (LOPRESSOR) 25 MG tablet Take 1 tablet (25 mg total) by mouth 2 (two) times daily. 02/25/15   Plotnikov, Evie Lacks, MD  mirtazapine (REMERON) 15 MG tablet TAKE ONE TABLET BY MOUTH EVERY EVENING BEFORE DINNER AT 4 - 5 PM 02/12/17   Plotnikov, Evie Lacks, MD  ranitidine (ZANTAC) 150 MG tablet Take 1 tablet (150 mg total) by mouth at bedtime. 04/16/15   Plotnikov, Evie Lacks, MD  traMADol (ULTRAM) 50 MG tablet TAKE 1 TABLET BY MOUTH EVERY 6 HOURS as needed for pain 03/06/17   Plotnikov, Evie Lacks, MD    Family History Family History  Problem Relation Age of Onset  . Arthritis Mother   . Hypertension Unknown   . Cancer Sister     Social History Social History    Tobacco Use  . Smoking status: Never Smoker  . Smokeless tobacco: Never Used  Substance Use Topics  . Alcohol use: No  . Drug use: No     Allergies   Lidocaine hcl   Review of Systems Review of Systems  Constitutional: Negative for chills and fever.  Eyes: Negative for visual disturbance.  Respiratory: Negative for shortness of breath.   Cardiovascular: Negative for chest pain.  Gastrointestinal: Negative for abdominal pain.  Genitourinary: Negative for dysuria.  Musculoskeletal: Negative for back pain and neck pain.       +tailbone pain  Skin: Positive for color change. Negative for wound.  Neurological: Positive for weakness (generalized). Negative for dizziness, syncope, light-headedness, numbness and headaches.  All other systems  reviewed and are negative.    Physical Exam Updated Vital Signs BP (!) 163/87 (BP Location: Left Arm)   Pulse (!) 102   Temp 97.9 F (36.6 C) (Oral)   Resp (!) 21   Ht 5\' 5"  (1.651 m)   Wt 52.2 kg (115 lb)   SpO2 97%   BMI 19.14 kg/m   Physical Exam  Constitutional: She is oriented to person, place, and time. She appears well-developed and well-nourished. No distress.  HENT:  Head: Normocephalic.  Ecchymosis surrounding the right eye which is non-tender  Eyes: Conjunctivae are normal. Pupils are equal, round, and reactive to light. Right eye exhibits no discharge. Left eye exhibits no discharge. No scleral icterus.  Neck: Normal range of motion.  No C-spine tenderness  Cardiovascular: Regular rhythm. Tachycardia present. Exam reveals no gallop and no friction rub.  No murmur heard. Pulmonary/Chest: Effort normal. No stridor. No respiratory distress. She has no wheezes. She has no rales. She exhibits no tenderness.  Abdominal: Soft. Bowel sounds are normal. She exhibits no distension. There is no tenderness.  Neurological: She is alert and oriented to person, place, and time.  Mental Status:  Alert, oriented, thought content  appropriate, able to give a coherent history. Speech fluent without evidence of aphasia. Able to follow 2 step commands without difficulty.  Cranial Nerves:  II:  Peripheral visual fields grossly normal, pupils equal, round, reactive to light III,IV, VI: ptosis not present, extra-ocular motions intact bilaterally  V,VII: smile is asymmetric with right sided weakness VIII: hearing grossly normal to voice  X: uvula elevates symmetrically  XI: bilateral shoulder shrug symmetric and strong XII: midline tongue extension without fassiculations Motor:  Normal tone. 5/5 in upper and lower extremities bilaterally including strong and equal grip strength and dorsiflexion/plantar flexion. There is very subtle weakness in the right upper and lower extremity Sensory: Pinprick and light touch normal in all extremities.  Cerebellar: normal finger-to-nose with bilateral upper extremities Gait: not tested CV: distal pulses palpable throughout    Skin: Skin is warm and dry.  Psychiatric: She has a normal mood and affect. Her behavior is normal.  Nursing note and vitals reviewed.    ED Treatments / Results  Labs (all labs ordered are listed, but only abnormal results are displayed) Labs Reviewed  COMPREHENSIVE METABOLIC PANEL - Abnormal; Notable for the following components:      Result Value   Glucose, Bld 111 (*)    All other components within normal limits  I-STAT CHEM 8, ED - Abnormal; Notable for the following components:   Glucose, Bld 108 (*)    All other components within normal limits  PROTIME-INR  APTT  CBC  DIFFERENTIAL  URINALYSIS, ROUTINE W REFLEX MICROSCOPIC  I-STAT TROPONIN, ED  CBG MONITORING, ED    EKG  EKG Interpretation  Date/Time:  Sunday May 06 2017 08:37:51 EST Ventricular Rate:  102 PR Interval:    QRS Duration: 84 QT Interval:  363 QTC Calculation: 473 R Axis:   -52 Text Interpretation:  Sinus tachycardia Non-specific ST-t changes No significant change  since last tracing Confirmed by Pattricia Boss [phone]) on 05/06/2017 9:40:36 AM Also confirmed by Pattricia Boss [phone]), editor Philomena Doheny [phone])  on 05/06/2017 9:53:01 AM       Radiology Dg Sacrum/coccyx  Result Date: 05/06/2017 CLINICAL DATA:  Buttock pain EXAM: SACRUM AND COCCYX - 2+ VIEW COMPARISON:  None. FINDINGS: Pelvic ring is intact. Bilateral hip replacement is noted. Irregularity is noted in the distal sacrum consistent  with prior fracture. This is of indeterminate age. No other focal abnormality is noted. Marland Kitchen IMPRESSION: Distal sacral fracture of uncertain age. Electronically Signed   By: Inez Catalina M.D.   On: 05/06/2017 12:43   Ct Head Wo Contrast  Result Date: 05/06/2017 CLINICAL DATA:  83 year old female with a history of fall and bruising on the forehead EXAM: CT HEAD WITHOUT CONTRAST CT MAXILLOFACIAL WITHOUT CONTRAST CT CERVICAL SPINE WITHOUT CONTRAST TECHNIQUE: Multidetector CT imaging of the head, cervical spine, and maxillofacial structures were performed using the standard protocol without intravenous contrast. Multiplanar CT image reconstructions of the cervical spine and maxillofacial structures were also generated. COMPARISON:  04/30/2007 FINDINGS: CT HEAD FINDINGS Brain: No acute intracranial hemorrhage. No midline shift or mass effect. Similar appearance of mild brain volume loss. Unremarkable configuration the ventricles. Gray-white differentiation maintained. Confluent hypodensity in the periventricular white matter, similar prior. Vascular: Calcifications of anterior circulation. Skull: No skull fracture. Soft tissue swelling in the left supraorbital region. Other: Moderate opacification of ethmoid air cells with minimal sphenoid sinus mucoperiosteal thickening. CT MAXILLOFACIAL FINDINGS Osseous: No fracture of the mid face. No mandible fracture. Bilateral condyles appear located. Limited evaluation at the dentition secondary to hardware. Orbits: Bilateral lens  extraction Sinuses: Moderate opacification of ethmoid air cells. Trace mucoperiosteal thickening of the bilateral maxillary sinuses and the sphenoid sinuses. Soft tissues: Soft tissue swelling in the left supraorbital region. CT CERVICAL SPINE FINDINGS Alignment: No subluxation of the cervical elements. Trace anterolisthesis of C3 on C4 and C4 on C5. Facets maintain alignment. Craniocervical junction unremarkable with no skullbase fracture. Skull base and vertebrae: No skullbase fracture with the craniocervical junction aligned. No fracture of the vertebral bodies identified. Vertebral body heights maintained. Soft tissues and spinal canal: No canal hematoma identified. No soft tissue swelling or asymmetry of the cervical soft tissues. Dense calcifications of the bilateral carotid. Disc levels: Multilevel degenerative disc disease of the cervical spine. Greatest degree of disc space narrowing at C5-C6 with uncovertebral joint disease and small posterior disc osteophyte complex. More moderate disc space narrowing at C6-C7 with uncovertebral joint disease. Mild facet hypertrophy present throughout the cervical spine which is most pronounced on the right at C3-C4 and C4-C5. On the left there is ankylosis of the C2-C3 facet and hypertrophy at the C3-C4 and C4-C5 facet. The changes result in the greatest degree of foraminal narrowing on the left at C5-C6. No facet fracture. Upper chest: Upper chest not imaged Other: None IMPRESSION: Head CT: CT negative for acute intracranial abnormality. Evidence of chronic microvascular ischemic disease. Maxillofacial CT: No acute facial fracture. Soft tissue swelling in the left supraorbital scalp without underlying fracture. No radiopaque foreign body. Moderate paranasal sinus disease predominantly involving the ethmoid air cells. Cervical CT: CT negative for acute fracture malalignment of the cervical spine. Greatest degree of disc disease present at C5-C6 with associated facet  disease resulting in mild a moderate left-sided foraminal narrowing. Trace anterolisthesis of C3 on C4 and C4 on C5, likely degenerative. Carotid calcifications. Electronically Signed   By: Corrie Mckusick D.O.   On: 05/06/2017 10:30   Ct Cervical Spine Wo Contrast  Result Date: 05/06/2017 CLINICAL DATA:  83 year old female with a history of fall and bruising on the forehead EXAM: CT HEAD WITHOUT CONTRAST CT MAXILLOFACIAL WITHOUT CONTRAST CT CERVICAL SPINE WITHOUT CONTRAST TECHNIQUE: Multidetector CT imaging of the head, cervical spine, and maxillofacial structures were performed using the standard protocol without intravenous contrast. Multiplanar CT image reconstructions of the cervical spine and maxillofacial  structures were also generated. COMPARISON:  04/30/2007 FINDINGS: CT HEAD FINDINGS Brain: No acute intracranial hemorrhage. No midline shift or mass effect. Similar appearance of mild brain volume loss. Unremarkable configuration the ventricles. Gray-white differentiation maintained. Confluent hypodensity in the periventricular white matter, similar prior. Vascular: Calcifications of anterior circulation. Skull: No skull fracture. Soft tissue swelling in the left supraorbital region. Other: Moderate opacification of ethmoid air cells with minimal sphenoid sinus mucoperiosteal thickening. CT MAXILLOFACIAL FINDINGS Osseous: No fracture of the mid face. No mandible fracture. Bilateral condyles appear located. Limited evaluation at the dentition secondary to hardware. Orbits: Bilateral lens extraction Sinuses: Moderate opacification of ethmoid air cells. Trace mucoperiosteal thickening of the bilateral maxillary sinuses and the sphenoid sinuses. Soft tissues: Soft tissue swelling in the left supraorbital region. CT CERVICAL SPINE FINDINGS Alignment: No subluxation of the cervical elements. Trace anterolisthesis of C3 on C4 and C4 on C5. Facets maintain alignment. Craniocervical junction unremarkable with no  skullbase fracture. Skull base and vertebrae: No skullbase fracture with the craniocervical junction aligned. No fracture of the vertebral bodies identified. Vertebral body heights maintained. Soft tissues and spinal canal: No canal hematoma identified. No soft tissue swelling or asymmetry of the cervical soft tissues. Dense calcifications of the bilateral carotid. Disc levels: Multilevel degenerative disc disease of the cervical spine. Greatest degree of disc space narrowing at C5-C6 with uncovertebral joint disease and small posterior disc osteophyte complex. More moderate disc space narrowing at C6-C7 with uncovertebral joint disease. Mild facet hypertrophy present throughout the cervical spine which is most pronounced on the right at C3-C4 and C4-C5. On the left there is ankylosis of the C2-C3 facet and hypertrophy at the C3-C4 and C4-C5 facet. The changes result in the greatest degree of foraminal narrowing on the left at C5-C6. No facet fracture. Upper chest: Upper chest not imaged Other: None IMPRESSION: Head CT: CT negative for acute intracranial abnormality. Evidence of chronic microvascular ischemic disease. Maxillofacial CT: No acute facial fracture. Soft tissue swelling in the left supraorbital scalp without underlying fracture. No radiopaque foreign body. Moderate paranasal sinus disease predominantly involving the ethmoid air cells. Cervical CT: CT negative for acute fracture malalignment of the cervical spine. Greatest degree of disc disease present at C5-C6 with associated facet disease resulting in mild a moderate left-sided foraminal narrowing. Trace anterolisthesis of C3 on C4 and C4 on C5, likely degenerative. Carotid calcifications. Electronically Signed   By: Corrie Mckusick D.O.   On: 05/06/2017 10:30   Mr Brain Wo Contrast  Result Date: 05/06/2017 CLINICAL DATA:  Focal neuro deficit, greater than 6 hours, stroke suspected. Left-sided weakness and facial droop. EXAM: MRI HEAD WITHOUT  CONTRAST TECHNIQUE: Multiplanar, multiecho pulse sequences of the brain and surrounding structures were obtained without intravenous contrast. COMPARISON:  CT head without contrast from the same day. FINDINGS: Brain: The diffusion-weighted images demonstrate a right paramedian pontine infarct measuring 14 mm cephalo caudad. No acute supratentorial infarct is present. Advanced atrophy and confluent diffuse white matter disease is present otherwise. There is a remote lacunar infarct involving the right lentiform nucleus. Ischemic changes are present in the thalami bilaterally. Mild white matter changes extend into the brainstem otherwise. T2 signal changes associated with the acute/ subacute infarct. The cerebellum is normal. Vascular: Flow is present in the major intracranial arteries. Skull and upper cervical spine: The skullbase is within normal limits. Midline sagittal structures are unremarkable. The craniocervical junction is within normal limits. Sinuses/Orbits: Diffuse opacification is present in the ethmoid air cells bilaterally. Right maxillary  antrostomy is noted. There is mild mucosal thickening involving the maxillary sinuses bilaterally. Anterior sphenoid mucosal thickening is present. Mild mucosal thickening is present in the frontal sinuses bilaterally. There is some fluid in the mastoid air cells bilaterally. No obstructing nasopharyngeal lesion is present. IMPRESSION: 1. Acute/subacute nonhemorrhagic linear infarct involving the right paramedian pons, corresponding with the patient's left-sided weakness. 2. No acute supratentorial infarct. 3. Advanced atrophy and diffuse white matter disease likely reflects the sequela of chronic microvascular ischemia. 4. Moderate paranasal sinus disease, predominantly involving the ethmoids. Electronically Signed   By: San Morelle M.D.   On: 05/06/2017 12:34   Ct Maxillofacial Wo Contrast  Result Date: 05/06/2017 CLINICAL DATA:  83 year old female  with a history of fall and bruising on the forehead EXAM: CT HEAD WITHOUT CONTRAST CT MAXILLOFACIAL WITHOUT CONTRAST CT CERVICAL SPINE WITHOUT CONTRAST TECHNIQUE: Multidetector CT imaging of the head, cervical spine, and maxillofacial structures were performed using the standard protocol without intravenous contrast. Multiplanar CT image reconstructions of the cervical spine and maxillofacial structures were also generated. COMPARISON:  04/30/2007 FINDINGS: CT HEAD FINDINGS Brain: No acute intracranial hemorrhage. No midline shift or mass effect. Similar appearance of mild brain volume loss. Unremarkable configuration the ventricles. Gray-white differentiation maintained. Confluent hypodensity in the periventricular white matter, similar prior. Vascular: Calcifications of anterior circulation. Skull: No skull fracture. Soft tissue swelling in the left supraorbital region. Other: Moderate opacification of ethmoid air cells with minimal sphenoid sinus mucoperiosteal thickening. CT MAXILLOFACIAL FINDINGS Osseous: No fracture of the mid face. No mandible fracture. Bilateral condyles appear located. Limited evaluation at the dentition secondary to hardware. Orbits: Bilateral lens extraction Sinuses: Moderate opacification of ethmoid air cells. Trace mucoperiosteal thickening of the bilateral maxillary sinuses and the sphenoid sinuses. Soft tissues: Soft tissue swelling in the left supraorbital region. CT CERVICAL SPINE FINDINGS Alignment: No subluxation of the cervical elements. Trace anterolisthesis of C3 on C4 and C4 on C5. Facets maintain alignment. Craniocervical junction unremarkable with no skullbase fracture. Skull base and vertebrae: No skullbase fracture with the craniocervical junction aligned. No fracture of the vertebral bodies identified. Vertebral body heights maintained. Soft tissues and spinal canal: No canal hematoma identified. No soft tissue swelling or asymmetry of the cervical soft tissues. Dense  calcifications of the bilateral carotid. Disc levels: Multilevel degenerative disc disease of the cervical spine. Greatest degree of disc space narrowing at C5-C6 with uncovertebral joint disease and small posterior disc osteophyte complex. More moderate disc space narrowing at C6-C7 with uncovertebral joint disease. Mild facet hypertrophy present throughout the cervical spine which is most pronounced on the right at C3-C4 and C4-C5. On the left there is ankylosis of the C2-C3 facet and hypertrophy at the C3-C4 and C4-C5 facet. The changes result in the greatest degree of foraminal narrowing on the left at C5-C6. No facet fracture. Upper chest: Upper chest not imaged Other: None IMPRESSION: Head CT: CT negative for acute intracranial abnormality. Evidence of chronic microvascular ischemic disease. Maxillofacial CT: No acute facial fracture. Soft tissue swelling in the left supraorbital scalp without underlying fracture. No radiopaque foreign body. Moderate paranasal sinus disease predominantly involving the ethmoid air cells. Cervical CT: CT negative for acute fracture malalignment of the cervical spine. Greatest degree of disc disease present at C5-C6 with associated facet disease resulting in mild a moderate left-sided foraminal narrowing. Trace anterolisthesis of C3 on C4 and C4 on C5, likely degenerative. Carotid calcifications. Electronically Signed   By: Corrie Mckusick D.O.   On: 05/06/2017 10:30  Procedures Procedures (including critical care time)  Medications Ordered in ED Medications  sodium chloride 0.9 % bolus 1,000 mL (1,000 mLs Intravenous New Bag/Given 05/06/17 1059)  acetaminophen (TYLENOL) tablet 650 mg (650 mg Oral Given 05/06/17 1037)     Initial Impression / Assessment and Plan / ED Course  I have reviewed the triage vital signs and the nursing notes.  Pertinent labs & imaging results that were available during my care of the patient were reviewed by me and considered in my  medical decision making (see chart for details).  83 year old female presents with left sided facial droop. She is hypertensive and mildly tachycardic on arrival. Exam is remarkable for facial droop and very mild left sided weakness. Labs are normal. CT of head, face, neck are negative for an acute process. UA is pending. Fluids and Tylenol were given. Discussed with Dr. Jeanell Sparrow. Will admit for CVA work up. Discussed with Dr. Rory Percy who will come to see patient.   MRI shows acute infarct of the right pons. [facility] to admit.  Final Clinical Impressions(s) / ED Diagnoses   Final diagnoses:  Facial droop  Cerebrovascular accident (CVA), unspecified mechanism ([place])  Acute left-sided weakness    ED Discharge Orders    None       Recardo Evangelist, PA-C 05/06/17 1344    Pattricia Boss, MD 05/07/17 [phone]

## 2017-05-06 NOTE — ED Notes (Signed)
Heart Healthy Diet Ordered for Dinner.

## 2017-05-06 NOTE — ED Notes (Signed)
Patient transported to X-ray 

## 2017-05-06 NOTE — ED Notes (Signed)
Report attempted 

## 2017-05-06 NOTE — ED Notes (Signed)
Pt had no fluids running when returned from MRI

## 2017-05-06 NOTE — H&P (Signed)
History and Physical    Theresa Hampton KZS:[id number] DOB: Mar 04, 1920 DOA: 05/06/2017   PCP: Cassandria Anger, MD   Patient coming from:  Home    Chief Complaint: Left facial droop  HPI: Theresa Hampton is a 83 y.o. female with history of hypertension, hyperlipidemia, history of PUD, vitamin D and B12 deficiency, osteoporosis, hard of hearing who was in his usual state of health until about 1 week ago, when she began having some difficulty walking, with increasing falls 2 days prior to admission.  She was last seen normal around 7 PM last evening.  This morning, when seen by her daughter, she noticed the patient to have left-sided facial droop, and brought her to the ED for further evaluation.  She also was noted to have mild dysarthria, very mild left hemiparesis on presentation.  She denies any dysphagia.  She denies any vision changes or headaches.  She denies any nausea, or vomiting.  She denies any shortness of breath, or chest pain.  She denies any abdominal pain.  No dysuria or gross hematuria, no bowel or urine incontinence.  She denies any lower extremity swelling or calf pain.  She is not on aspirin on a daily basis, as she forgets to take them.  She denies any tobacco, alcohol, or recreational drug use.  She is not very active.  She denies any recent surgeries.  No bleeding issues are reported.  Of note, the patient is on hormonal patch to help her with her hot flashes.  She had been taking this medicine for many years.   ED Course:  BP (!) 159/80 (BP Location: Right Arm)   Pulse 95   Temp 98.5 F (36.9 C) (Oral)   Resp 13   Ht 5\' 5"  (1.651 m)   Wt 52.2 kg (115 lb)   SpO2 96%   BMI 19.14 kg/m    Sinus tachycardia Non-specific ST-t changes No significant change since last tracing CT of the head, did not reveal any acute abnormality. MRI of the brain however showed right paramedian pons area restricted diffusion, most likely related to small vessel stroke. Neurology has  seen and evaluated the patient, recommending full stroke workup. CBC, and chemistries are normal.  Review of Systems:  As per HPI otherwise all other systems reviewed and are negative  Past Medical History:  Diagnosis Date  . Allergic rhinitis   . Anemia    iron deficiency  . Chronic kidney disease    Pyelo 2014  . Chronic sinusitis   . Complication of anesthesia   . History of transfusion   . Hyperlipidemia   . Hypertension   . Leg cramps   . Osteoarthritis    Dr. Maureen Ralphs  . Osteoporosis   . Peptic ulcer disease 2003  . PONV (postoperative nausea and vomiting)   . Seizures (HCC)    x1 with Lidocaine  . Vitamin B12 deficiency   . Vitamin D deficiency     Past Surgical History:  Procedure Laterality Date  . JOINT REPLACEMENT  2004   rt total hip  . THYROIDECTOMY, PARTIAL  "many years ago"  . TOTAL HIP ARTHROPLASTY Left 01/06/2015   Procedure: LEFT TOTAL HIP ARTHROPLASTY ANTERIOR APPROACH;  Surgeon: Gaynelle Arabian, MD;  Location: [facility];  Service: Orthopedics;  Laterality: Left;  . uterine ablation  2008    Social History Social History   Socioeconomic History  . Marital status: Widowed    Spouse name: Not on file  . Number of children:  Not on file  . Years of education: Not on file  . Highest education level: Not on file  Social Needs  . Financial resource strain: Not on file  . Food insecurity - worry: Not on file  . Food insecurity - inability: Not on file  . Transportation needs - medical: Not on file  . Transportation needs - non-medical: Not on file  Occupational History  . Occupation: retired  Tobacco Use  . Smoking status: Never Smoker  . Smokeless tobacco: Never Used  Substance and Sexual Activity  . Alcohol use: No  . Drug use: No  . Sexual activity: Not on file  Other Topics Concern  . Not on file  Social History Narrative  . Not on file     Allergies  Allergen Reactions  . Lidocaine Hcl     seizure    Family History  Problem  Relation Age of Onset  . Arthritis Mother   . Hypertension Unknown   . Cancer Sister       Prior to Admission medications   Medication Sig Start Date End Date Taking? Authorizing Provider  amLODipine (NORVASC) 2.5 MG tablet TAKE 1 TABLET (2.5 MG TOTAL) BY MOUTH DAILY. 12/14/16  Yes Plotnikov, Evie Lacks, MD  aspirin EC 81 MG tablet Take 81 mg by mouth daily.   Yes [provider]  Cholecalciferol 2000 UNITS TABS Take 2 each by mouth at bedtime.   Yes [provider]  clonazePAM (KLONOPIN) 0.25 MG disintegrating tablet DISSOLVE ONE TABLET BY MOUTH TWICE A DAY AS NEEDED FOR ANXIETY 01/17/17  Yes Plotnikov, Evie Lacks, MD  cyanocobalamin 500 MCG tablet Take 500 mcg by mouth daily.   Yes [provider]  estradiol (CLIMARA - DOSED IN MG/24 HR) 0.05 mg/24hr patch Place 1 patch (0.05 mg total) onto the skin once a week. 04/20/16  Yes Plotnikov, Evie Lacks, MD  fluticasone (FLONASE) 50 MCG/ACT nasal spray Place 1 spray into the nose daily. 08/21/12  Yes Plotnikov, Evie Lacks, MD  gabapentin (NEURONTIN) 100 MG capsule TAKE 1 CAPSULE (100 MG TOTAL) BY MOUTH AT BEDTIME. 02/12/17  Yes Plotnikov, Evie Lacks, MD  loratadine (CLARITIN) 10 MG tablet Take 1 tablet (10 mg total) by mouth daily. 04/16/15  Yes Plotnikov, Evie Lacks, MD  LUMIGAN 0.01 % SOLN instill 1 drop IN [facility] E[facility] AT BEDTIME 06/05/16  Yes [provider]  mirtazapine (REMERON) 15 MG tablet TAKE ONE TABLET BY MOUTH EVERY EVENING BEFORE DINNER AT 4 - 5 PM 02/12/17  Yes Plotnikov, Evie Lacks, MD  traMADol (ULTRAM) 50 MG tablet TAKE 1 TABLET BY MOUTH EVERY 6 HOURS as needed for pain 03/06/17  Yes Plotnikov, Evie Lacks, MD  clobetasol cream (TEMOVATE) 2.83 % Apply 1 application topically 2 (two) times daily. Use sparingly. Patient not taking: Reported on 05/06/2017 08/25/16   Plotnikov, Evie Lacks, MD  metoprolol tartrate (LOPRESSOR) 25 MG tablet Take 1 tablet (25 mg total) by mouth 2 (two) times daily. Patient not taking:  Reported on 05/06/2017 02/25/15   Plotnikov, Evie Lacks, MD  ranitidine (ZANTAC) 150 MG tablet Take 1 tablet (150 mg total) by mouth at bedtime. Patient not taking: Reported on 05/06/2017 04/16/15   Cassandria Anger, MD    Physical Exam:  Vitals:   05/06/17 1030 05/06/17 1100 05/06/17 1115 05/06/17 1257  BP: (!) 149/71 (!) 146/71 (!) 142/76 (!) 159/80  Pulse: 92 95 90 95  Resp: 17 16 11 13   Temp:    98.5 F (36.9 C)  TempSrc:    Oral  SpO2: 97% 97% 97% 96%  Weight:      Height:       Constitutional: NAD, calm, comfortable  Eyes: PERRL, lids and conjunctivae normal.  Left eye with some bruising after falling, with no damage to the cornea or the orbital area. ENMT: Mucous membranes are moist, without exudate or lesions  Neck: normal, supple, no masses, no thyromegaly Respiratory: clear to auscultation bilaterally, no wheezing, no crackles. Normal respiratory effort  Cardiovascular: Regular rate and rhythm,  murmur, rubs or gallops. No extremity edema. 2+ pedal pulses. Questionable left carotid bruit  carotid bruits.  Abdomen: Soft, non tender, No hepatosplenomegaly. Bowel sounds positive.  Musculoskeletal: no clubbing / cyanosis. Moves all extremities Skin: no jaundice, No lesions.  Neurologic: Sensation intact mildly dysarthric.  Noted left facial droop.  Left upper extremity 4 out of 5 strength, as well as 4 out of 5 left lower extremity. Psychiatric:   Alert and oriented x 3. Normal mood.     Labs on Admission: I have personally reviewed following labs and imaging studies  CBC: Recent Labs  Lab 05/06/17 0857 05/06/17 0916  WBC 9.1  --   NEUTROABS 7.1  --   HGB 12.2 12.2  HCT 36.7 36.0  MCV 87.8  --   PLT 257  --     Basic Metabolic Panel: Recent Labs  Lab 05/06/17 0857 05/06/17 0916  NA 140 143  K 3.5 3.6  CL 104 106  CO2 25  --   GLUCOSE 111* 108*  BUN 10 10  CREATININE 0.79 0.60  CALCIUM 9.2  --     GFR: Estimated Creatinine Clearance: 34.7 mL/min  (by C-G formula based on SCr of 0.6 mg/dL).  Liver Function Tests: Recent Labs  Lab 05/06/17 0857  AST 19  ALT 14  ALKPHOS 80  BILITOT 0.9  PROT 7.5  ALBUMIN 3.5   No results for input(s): LIPASE, AMYLASE in the last 168 hours. No results for input(s): AMMONIA in the last 168 hours.  Coagulation Profile: Recent Labs  Lab 05/06/17 0857  INR 1.01    Cardiac Enzymes: No results for input(s): CKTOTAL, CKMB, CKMBINDEX, TROPONINI in the last 168 hours.  BNP (last 3 results) No results for input(s): PROBNP in the last 8760 hours.  HbA1C: No results for input(s): HGBA1C in the last 72 hours.  CBG: No results for input(s): GLUCAP in the last 168 hours.  Lipid Profile: No results for input(s): CHOL, HDL, LDLCALC, TRIG, CHOLHDL, LDLDIRECT in the last 72 hours.  Thyroid Function Tests: No results for input(s): TSH, T4TOTAL, FREET4, T3FREE, THYROIDAB in the last 72 hours.  Anemia Panel: No results for input(s): VITAMINB12, FOLATE, FERRITIN, TIBC, IRON, RETICCTPCT in the last 72 hours.  Urine analysis:    Component Value Date/Time   COLORURINE STRAW (A) 05/06/2017 1056   APPEARANCEUR CLEAR 05/06/2017 1056   LABSPEC 1.006 05/06/2017 1056   PHURINE 5.0 05/06/2017 1056   GLUCOSEU NEGATIVE 05/06/2017 1056   GLUCOSEU NEGATIVE 08/01/2016 1530   HGBUR NEGATIVE 05/06/2017 [place] 05/06/2017 [place] 05/06/2017 1056   PROTEINUR NEGATIVE 05/06/2017 1056   UROBILINOGEN 0.2 08/01/2016 1530   NITRITE NEGATIVE 05/06/2017 1056   LEUKOCYTESUR NEGATIVE 05/06/2017 1056    Sepsis Labs: @LABRCNTIP (procalcitonin:4,lacticidven:4) )No results found for this or any previous visit (from the past 240 hour(s)).   Radiological Exams on Admission: Dg Sacrum/coccyx  Result Date: 05/06/2017 CLINICAL DATA:  Buttock pain EXAM: SACRUM AND  COCCYX - 2+ VIEW COMPARISON:  None. FINDINGS: Pelvic ring is intact. Bilateral hip replacement is noted. Irregularity is  noted in the distal sacrum consistent with prior fracture. This is of indeterminate age. No other focal abnormality is noted. Marland Kitchen IMPRESSION: Distal sacral fracture of uncertain age. Electronically Signed   By: Inez Catalina M.D.   On: 05/06/2017 12:43   Ct Head Wo Contrast  Result Date: 05/06/2017 CLINICAL DATA:  83 year old female with a history of fall and bruising on the forehead EXAM: CT HEAD WITHOUT CONTRAST CT MAXILLOFACIAL WITHOUT CONTRAST CT CERVICAL SPINE WITHOUT CONTRAST TECHNIQUE: Multidetector CT imaging of the head, cervical spine, and maxillofacial structures were performed using the standard protocol without intravenous contrast. Multiplanar CT image reconstructions of the cervical spine and maxillofacial structures were also generated. COMPARISON:  04/30/2007 FINDINGS: CT HEAD FINDINGS Brain: No acute intracranial hemorrhage. No midline shift or mass effect. Similar appearance of mild brain volume loss. Unremarkable configuration the ventricles. Gray-white differentiation maintained. Confluent hypodensity in the periventricular white matter, similar prior. Vascular: Calcifications of anterior circulation. Skull: No skull fracture. Soft tissue swelling in the left supraorbital region. Other: Moderate opacification of ethmoid air cells with minimal sphenoid sinus mucoperiosteal thickening. CT MAXILLOFACIAL FINDINGS Osseous: No fracture of the mid face. No mandible fracture. Bilateral condyles appear located. Limited evaluation at the dentition secondary to hardware. Orbits: Bilateral lens extraction Sinuses: Moderate opacification of ethmoid air cells. Trace mucoperiosteal thickening of the bilateral maxillary sinuses and the sphenoid sinuses. Soft tissues: Soft tissue swelling in the left supraorbital region. CT CERVICAL SPINE FINDINGS Alignment: No subluxation of the cervical elements. Trace anterolisthesis of C3 on C4 and C4 on C5. Facets maintain alignment. Craniocervical junction unremarkable  with no skullbase fracture. Skull base and vertebrae: No skullbase fracture with the craniocervical junction aligned. No fracture of the vertebral bodies identified. Vertebral body heights maintained. Soft tissues and spinal canal: No canal hematoma identified. No soft tissue swelling or asymmetry of the cervical soft tissues. Dense calcifications of the bilateral carotid. Disc levels: Multilevel degenerative disc disease of the cervical spine. Greatest degree of disc space narrowing at C5-C6 with uncovertebral joint disease and small posterior disc osteophyte complex. More moderate disc space narrowing at C6-C7 with uncovertebral joint disease. Mild facet hypertrophy present throughout the cervical spine which is most pronounced on the right at C3-C4 and C4-C5. On the left there is ankylosis of the C2-C3 facet and hypertrophy at the C3-C4 and C4-C5 facet. The changes result in the greatest degree of foraminal narrowing on the left at C5-C6. No facet fracture. Upper chest: Upper chest not imaged Other: None IMPRESSION: Head CT: CT negative for acute intracranial abnormality. Evidence of chronic microvascular ischemic disease. Maxillofacial CT: No acute facial fracture. Soft tissue swelling in the left supraorbital scalp without underlying fracture. No radiopaque foreign body. Moderate paranasal sinus disease predominantly involving the ethmoid air cells. Cervical CT: CT negative for acute fracture malalignment of the cervical spine. Greatest degree of disc disease present at C5-C6 with associated facet disease resulting in mild a moderate left-sided foraminal narrowing. Trace anterolisthesis of C3 on C4 and C4 on C5, likely degenerative. Carotid calcifications. Electronically Signed   By: Corrie Mckusick D.O.   On: 05/06/2017 10:30   Ct Cervical Spine Wo Contrast  Result Date: 05/06/2017 CLINICAL DATA:  83 year old female with a history of fall and bruising on the forehead EXAM: CT HEAD WITHOUT CONTRAST CT  MAXILLOFACIAL WITHOUT CONTRAST CT CERVICAL SPINE WITHOUT CONTRAST TECHNIQUE: Multidetector CT imaging of  the head, cervical spine, and maxillofacial structures were performed using the standard protocol without intravenous contrast. Multiplanar CT image reconstructions of the cervical spine and maxillofacial structures were also generated. COMPARISON:  04/30/2007 FINDINGS: CT HEAD FINDINGS Brain: No acute intracranial hemorrhage. No midline shift or mass effect. Similar appearance of mild brain volume loss. Unremarkable configuration the ventricles. Gray-white differentiation maintained. Confluent hypodensity in the periventricular white matter, similar prior. Vascular: Calcifications of anterior circulation. Skull: No skull fracture. Soft tissue swelling in the left supraorbital region. Other: Moderate opacification of ethmoid air cells with minimal sphenoid sinus mucoperiosteal thickening. CT MAXILLOFACIAL FINDINGS Osseous: No fracture of the mid face. No mandible fracture. Bilateral condyles appear located. Limited evaluation at the dentition secondary to hardware. Orbits: Bilateral lens extraction Sinuses: Moderate opacification of ethmoid air cells. Trace mucoperiosteal thickening of the bilateral maxillary sinuses and the sphenoid sinuses. Soft tissues: Soft tissue swelling in the left supraorbital region. CT CERVICAL SPINE FINDINGS Alignment: No subluxation of the cervical elements. Trace anterolisthesis of C3 on C4 and C4 on C5. Facets maintain alignment. Craniocervical junction unremarkable with no skullbase fracture. Skull base and vertebrae: No skullbase fracture with the craniocervical junction aligned. No fracture of the vertebral bodies identified. Vertebral body heights maintained. Soft tissues and spinal canal: No canal hematoma identified. No soft tissue swelling or asymmetry of the cervical soft tissues. Dense calcifications of the bilateral carotid. Disc levels: Multilevel degenerative disc  disease of the cervical spine. Greatest degree of disc space narrowing at C5-C6 with uncovertebral joint disease and small posterior disc osteophyte complex. More moderate disc space narrowing at C6-C7 with uncovertebral joint disease. Mild facet hypertrophy present throughout the cervical spine which is most pronounced on the right at C3-C4 and C4-C5. On the left there is ankylosis of the C2-C3 facet and hypertrophy at the C3-C4 and C4-C5 facet. The changes result in the greatest degree of foraminal narrowing on the left at C5-C6. No facet fracture. Upper chest: Upper chest not imaged Other: None IMPRESSION: Head CT: CT negative for acute intracranial abnormality. Evidence of chronic microvascular ischemic disease. Maxillofacial CT: No acute facial fracture. Soft tissue swelling in the left supraorbital scalp without underlying fracture. No radiopaque foreign body. Moderate paranasal sinus disease predominantly involving the ethmoid air cells. Cervical CT: CT negative for acute fracture malalignment of the cervical spine. Greatest degree of disc disease present at C5-C6 with associated facet disease resulting in mild a moderate left-sided foraminal narrowing. Trace anterolisthesis of C3 on C4 and C4 on C5, likely degenerative. Carotid calcifications. Electronically Signed   By: Corrie Mckusick D.O.   On: 05/06/2017 10:30   Mr Brain Wo Contrast  Result Date: 05/06/2017 CLINICAL DATA:  Focal neuro deficit, greater than 6 hours, stroke suspected. Left-sided weakness and facial droop. EXAM: MRI HEAD WITHOUT CONTRAST TECHNIQUE: Multiplanar, multiecho pulse sequences of the brain and surrounding structures were obtained without intravenous contrast. COMPARISON:  CT head without contrast from the same day. FINDINGS: Brain: The diffusion-weighted images demonstrate a right paramedian pontine infarct measuring 14 mm cephalo caudad. No acute supratentorial infarct is present. Advanced atrophy and confluent diffuse white  matter disease is present otherwise. There is a remote lacunar infarct involving the right lentiform nucleus. Ischemic changes are present in the thalami bilaterally. Mild white matter changes extend into the brainstem otherwise. T2 signal changes associated with the acute/ subacute infarct. The cerebellum is normal. Vascular: Flow is present in the major intracranial arteries. Skull and upper cervical spine: The skullbase is within normal  limits. Midline sagittal structures are unremarkable. The craniocervical junction is within normal limits. Sinuses/Orbits: Diffuse opacification is present in the ethmoid air cells bilaterally. Right maxillary antrostomy is noted. There is mild mucosal thickening involving the maxillary sinuses bilaterally. Anterior sphenoid mucosal thickening is present. Mild mucosal thickening is present in the frontal sinuses bilaterally. There is some fluid in the mastoid air cells bilaterally. No obstructing nasopharyngeal lesion is present. IMPRESSION: 1. Acute/subacute nonhemorrhagic linear infarct involving the right paramedian pons, corresponding with the patient's left-sided weakness. 2. No acute supratentorial infarct. 3. Advanced atrophy and diffuse white matter disease likely reflects the sequela of chronic microvascular ischemia. 4. Moderate paranasal sinus disease, predominantly involving the ethmoids. Electronically Signed   By: San Morelle M.D.   On: 05/06/2017 12:34   Ct Maxillofacial Wo Contrast  Result Date: 05/06/2017 CLINICAL DATA:  83 year old female with a history of fall and bruising on the forehead EXAM: CT HEAD WITHOUT CONTRAST CT MAXILLOFACIAL WITHOUT CONTRAST CT CERVICAL SPINE WITHOUT CONTRAST TECHNIQUE: Multidetector CT imaging of the head, cervical spine, and maxillofacial structures were performed using the standard protocol without intravenous contrast. Multiplanar CT image reconstructions of the cervical spine and maxillofacial structures were also  generated. COMPARISON:  04/30/2007 FINDINGS: CT HEAD FINDINGS Brain: No acute intracranial hemorrhage. No midline shift or mass effect. Similar appearance of mild brain volume loss. Unremarkable configuration the ventricles. Gray-white differentiation maintained. Confluent hypodensity in the periventricular white matter, similar prior. Vascular: Calcifications of anterior circulation. Skull: No skull fracture. Soft tissue swelling in the left supraorbital region. Other: Moderate opacification of ethmoid air cells with minimal sphenoid sinus mucoperiosteal thickening. CT MAXILLOFACIAL FINDINGS Osseous: No fracture of the mid face. No mandible fracture. Bilateral condyles appear located. Limited evaluation at the dentition secondary to hardware. Orbits: Bilateral lens extraction Sinuses: Moderate opacification of ethmoid air cells. Trace mucoperiosteal thickening of the bilateral maxillary sinuses and the sphenoid sinuses. Soft tissues: Soft tissue swelling in the left supraorbital region. CT CERVICAL SPINE FINDINGS Alignment: No subluxation of the cervical elements. Trace anterolisthesis of C3 on C4 and C4 on C5. Facets maintain alignment. Craniocervical junction unremarkable with no skullbase fracture. Skull base and vertebrae: No skullbase fracture with the craniocervical junction aligned. No fracture of the vertebral bodies identified. Vertebral body heights maintained. Soft tissues and spinal canal: No canal hematoma identified. No soft tissue swelling or asymmetry of the cervical soft tissues. Dense calcifications of the bilateral carotid. Disc levels: Multilevel degenerative disc disease of the cervical spine. Greatest degree of disc space narrowing at C5-C6 with uncovertebral joint disease and small posterior disc osteophyte complex. More moderate disc space narrowing at C6-C7 with uncovertebral joint disease. Mild facet hypertrophy present throughout the cervical spine which is most pronounced on the right at  C3-C4 and C4-C5. On the left there is ankylosis of the C2-C3 facet and hypertrophy at the C3-C4 and C4-C5 facet. The changes result in the greatest degree of foraminal narrowing on the left at C5-C6. No facet fracture. Upper chest: Upper chest not imaged Other: None IMPRESSION: Head CT: CT negative for acute intracranial abnormality. Evidence of chronic microvascular ischemic disease. Maxillofacial CT: No acute facial fracture. Soft tissue swelling in the left supraorbital scalp without underlying fracture. No radiopaque foreign body. Moderate paranasal sinus disease predominantly involving the ethmoid air cells. Cervical CT: CT negative for acute fracture malalignment of the cervical spine. Greatest degree of disc disease present at C5-C6 with associated facet disease resulting in mild a moderate left-sided foraminal narrowing. Trace anterolisthesis of  C3 on C4 and C4 on C5, likely degenerative. Carotid calcifications. Electronically Signed   By: Corrie Mckusick D.O.   On: 05/06/2017 10:30    EKG: Independently reviewed.  Assessment/Plan Active Problems:   Stroke (cerebrum) (HCC)   B12 deficiency   Vitamin D deficiency   Hyperlipidemia   Sinusitis, chronic   Generalized anxiety disorder   Menopausal disorder   HTN (hypertension)   OA (osteoarthritis) of hip   Facial droop   Acute-Left facial droop in the setting of new dx of CVA  Not a TPA candidate as  last known normal was the night prior to hospitalization. MRI brain confirms  Risk factors for CVA include    Admit to Tele / Inpatient Stroke order set  MRA brain and MRA neck Allow permissive HTN, only treat as needed if SBP greater than 220. Echo  PT/OT/SLP  lipid panel A1C Plavix 75 mg daily Lipitor 80 mg p.o. daily Anticoagulation  Neuro following, appreciate involvement   d/c Hormonal patch    Hypertension BP (!) 159/80 (BP Location: Right Arm)   Pulse 95   Temp 98.5 F (36.9 C) (Oral)   Resp 13   Ht 5\' 5"  (1.651 m)   Wt  52.2 kg (115 lb)   SpO2 96%   BMI 19.14 kg/m  Controlled Continue home anti-hypertensive medications  Add Hydralazine Q8 hours as needed for BP > 956 systolic   Hyperlipidemia Lipitor 80 mg daily Lipid panel pending    Anxiety Continue home Klonopin, Remeron   Osteoporosis/ Vit D deficiency  Continue Cholecalciferol  B12 deficiency Continue B12 daily     DVT prophylaxis:  Heparin  Code Status:    Full  Family Communication:  Discussed with patient Disposition Plan: Expect patient to be discharged to home after condition improves Consults called:    Neuro per EDP, Dr. Rory Percy  Admission status:  Tele IP   Sharene Butters, PA-C [facility]   05/06/2017, 2:30 PM

## 2017-05-07 ENCOUNTER — Telehealth: Payer: Self-pay | Admitting: Internal Medicine

## 2017-05-07 ENCOUNTER — Inpatient Hospital Stay (HOSPITAL_COMMUNITY): Payer: Medicare Other

## 2017-05-07 DIAGNOSIS — R29810 Facial weakness: Secondary | ICD-10-CM

## 2017-05-07 DIAGNOSIS — I69919 Unspecified symptoms and signs involving cognitive functions following unspecified cerebrovascular disease: Secondary | ICD-10-CM

## 2017-05-07 DIAGNOSIS — I69991 Dysphagia following unspecified cerebrovascular disease: Secondary | ICD-10-CM

## 2017-05-07 DIAGNOSIS — D62 Acute posthemorrhagic anemia: Secondary | ICD-10-CM

## 2017-05-07 DIAGNOSIS — E559 Vitamin D deficiency, unspecified: Secondary | ICD-10-CM

## 2017-05-07 DIAGNOSIS — R000 Tachycardia, unspecified: Secondary | ICD-10-CM

## 2017-05-07 DIAGNOSIS — N183 Chronic kidney disease, stage 3 (moderate): Secondary | ICD-10-CM

## 2017-05-07 DIAGNOSIS — I361 Nonrheumatic tricuspid (valve) insufficiency: Secondary | ICD-10-CM

## 2017-05-07 DIAGNOSIS — I69391 Dysphagia following cerebral infarction: Secondary | ICD-10-CM

## 2017-05-07 DIAGNOSIS — E538 Deficiency of other specified B group vitamins: Secondary | ICD-10-CM

## 2017-05-07 DIAGNOSIS — M25532 Pain in left wrist: Secondary | ICD-10-CM

## 2017-05-07 DIAGNOSIS — N189 Chronic kidney disease, unspecified: Secondary | ICD-10-CM

## 2017-05-07 DIAGNOSIS — I6350 Cerebral infarction due to unspecified occlusion or stenosis of unspecified cerebral artery: Secondary | ICD-10-CM

## 2017-05-07 DIAGNOSIS — I69319 Unspecified symptoms and signs involving cognitive functions following cerebral infarction: Secondary | ICD-10-CM

## 2017-05-07 DIAGNOSIS — M1610 Unilateral primary osteoarthritis, unspecified hip: Secondary | ICD-10-CM

## 2017-05-07 DIAGNOSIS — R531 Weakness: Secondary | ICD-10-CM

## 2017-05-07 DIAGNOSIS — I10 Essential (primary) hypertension: Secondary | ICD-10-CM

## 2017-05-07 DIAGNOSIS — M25539 Pain in unspecified wrist: Secondary | ICD-10-CM

## 2017-05-07 DIAGNOSIS — I639 Cerebral infarction, unspecified: Principal | ICD-10-CM

## 2017-05-07 DIAGNOSIS — M1612 Unilateral primary osteoarthritis, left hip: Secondary | ICD-10-CM

## 2017-05-07 LAB — LIPID PANEL
CHOL/HDL RATIO: 3.3 ratio
Cholesterol: 217 mg/dL — ABNORMAL HIGH (ref 0–200)
HDL: 65 mg/dL (ref >40)
LDL CALC: 133 mg/dL — AB (ref 0–99)
TRIGLYCERIDES: 94 mg/dL (ref <150)
VLDL: 19 mg/dL (ref 0–40)

## 2017-05-07 LAB — CBC
HEMATOCRIT: 34.8 % — AB (ref 36.0–46.0)
Hemoglobin: 11.3 g/dL — ABNORMAL LOW (ref 12.0–15.0)
MCH: 28.7 pg (ref 26.0–34.0)
MCHC: 32.5 g/dL (ref 30.0–36.0)
MCV: 88.3 fL (ref 78.0–100.0)
PLATELETS: 231 10*3/uL (ref 150–400)
RBC: 3.94 MIL/uL (ref 3.87–5.11)
RDW: 12.9 % (ref 11.5–15.5)
WBC: 8.2 10*3/uL (ref 4.0–10.5)

## 2017-05-07 LAB — COMPREHENSIVE METABOLIC PANEL
ALT: 11 U/L — ABNORMAL LOW (ref 14–54)
ANION GAP: 10 (ref 5–15)
AST: 15 U/L (ref 15–41)
Albumin: 3 g/dL — ABNORMAL LOW (ref 3.5–5.0)
Alkaline Phosphatase: 71 U/L (ref 38–126)
BILIRUBIN TOTAL: 0.9 mg/dL (ref 0.3–1.2)
BUN: 9 mg/dL (ref 6–20)
CHLORIDE: 108 mmol/L (ref 101–111)
CO2: 22 mmol/L (ref 22–32)
Calcium: 8.5 mg/dL — ABNORMAL LOW (ref 8.9–10.3)
Creatinine, Ser: 0.78 mg/dL (ref 0.44–1.00)
Glucose, Bld: 87 mg/dL (ref 65–99)
POTASSIUM: 3.5 mmol/L (ref 3.5–5.1)
Sodium: 140 mmol/L (ref 135–145)
TOTAL PROTEIN: 6.3 g/dL — AB (ref 6.5–8.1)

## 2017-05-07 LAB — HEMOGLOBIN A1C
HEMOGLOBIN A1C: 5.3 % (ref 4.8–5.6)
MEAN PLASMA GLUCOSE: 105.41 mg/dL

## 2017-05-07 LAB — ECHOCARDIOGRAM COMPLETE
Height: 65 in
WEIGHTICAEL: 2024.7 [oz_av]

## 2017-05-07 MED ORDER — ATORVASTATIN CALCIUM 40 MG PO TABS
40.0000 mg | ORAL_TABLET | Freq: Every day | ORAL | Status: DC
  Administered 2017-05-07 – 2017-05-08 (×2): 40 mg via ORAL
  Filled 2017-05-07 (×2): qty 1

## 2017-05-07 NOTE — Evaluation (Signed)
Speech Language Pathology Evaluation Patient Details Name: Theresa Hampton MRN: [id number] DOB: March 30, 1920 Today's Date: 05/07/2017 Time: 7035-0093 SLP Time Calculation (min) (ACUTE ONLY): 11 min  Problem List:  Patient Active Problem List   Diagnosis Date Noted  . Facial droop 05/06/2017  . Stroke (cerebrum) ([facility]) 05/06/2017  . Fatigue 07/29/2015  . Acute sinusitis 03/10/2015  . OA (osteoarthritis) of hip 01/06/2015  . Preop exam for internal medicine 12/16/2014  . HTN (hypertension) 06/09/2014  . Grief 06/09/2014  . Hypokalemia 06/05/2014  . Menopausal disorder 04/07/2014  . Acute pyelonephritis 04/09/2013  . Generalized anxiety disorder 04/09/2013  . Nausea alone 04/07/2013  . Tachycardia 04/07/2013  . Abdominal bloating 04/07/2013  . Neuropathic pain 11/20/2012  . URI, acute 08/21/2012  . Hip pain, left 11/17/2011  . Wrist pain, right 10/27/2010  . Weakness of both legs 10/27/2010  . Cramp of limb 07/09/2009  . B12 deficiency 09/16/2008  . EYE PAIN 02/07/2008  . Sinusitis, chronic 02/07/2008  . Allergic rhinitis 08/06/2007  . Left carotid bruit 08/06/2007  . Vitamin D deficiency 04/26/2007  . Peptic ulcer 04/26/2007  . Osteoarthritis 04/26/2007  . Headache 04/26/2007  . Hyperlipidemia 01/11/2007  . ANEMIA-IRON DEFICIENCY 01/11/2007  . GASTRIC ULCER, ACUTE, HEMORRHAGE 01/11/2007  . DUODENITIS 01/11/2007   Past Medical History:  Past Medical History:  Diagnosis Date  . Allergic rhinitis   . Anemia    iron deficiency  . Chronic kidney disease    Pyelo 2014  . Chronic sinusitis   . Complication of anesthesia   . History of transfusion   . Hyperlipidemia   . Hypertension   . Leg cramps   . Osteoarthritis    Dr. Maureen Ralphs  . Osteoporosis   . Peptic ulcer disease 2003  . PONV (postoperative nausea and vomiting)   . Seizures ([facility])    x1 with Lidocaine  . Vitamin B12 deficiency   . Vitamin D deficiency    Past Surgical History:  Past Surgical History:   Procedure Laterality Date  . JOINT REPLACEMENT  2004   rt total hip  . THYROIDECTOMY, PARTIAL  "many years ago"  . TOTAL HIP ARTHROPLASTY Left 01/06/2015   Procedure: LEFT TOTAL HIP ARTHROPLASTY ANTERIOR APPROACH;  Surgeon: Gaynelle Arabian, MD;  Location: [facility];  Service: Orthopedics;  Laterality: Left;  . uterine ablation  2008   HPI:  Ptis a 83 y.o.left-handed femalewho has a past medical history of hypertension, hyperlipidemia, peptic ulcer disease, vitamin D and B12 deficiency, and CKD, who was admitted with 2 days of L facial droop and falls. MRI showed ana cute ischemic stroke in the R pons. Pt initially passed the stroke swallow screen but has had fluctuating slurred speech. The screen was therefore repeated and she did not pass. PTA pt lived alone but had a nurse who came almost every day, and her daughters live nearby.   Assessment / Plan / Recommendation Clinical Impression  Pt has a mild dysarthria characterized by slow rate of speech and mildly impaired articulation. Her baseline cognitive status is not clearly known, although she does live alone with assistance from family/caregivers for more complex cognitive tasks. Today she needs Min-Mod cues for emergent awareness, functional problem solving, and selective attention. Will continue to follow for speech and cognition until further clarification of baseline abilities can be obtained.    SLP Assessment  SLP Recommendation/Assessment: Patient needs continued Speech Lanaguage Pathology Services SLP Visit Diagnosis: Dysarthria and anarthria (R47.1);Cognitive communication deficit (R41.841)    Follow Up Recommendations  Inpatient Rehab    Frequency and Duration min 2x/week  2 weeks      SLP Evaluation Cognition  Overall Cognitive Status: No family/caregiver present to determine baseline cognitive functioning Arousal/Alertness: Awake/alert Orientation Level: Oriented X4 Attention: Sustained;Selective Sustained Attention:  Appears intact Selective Attention: Impaired Selective Attention Impairment: Functional basic;Verbal basic Awareness: Impaired Awareness Impairment: Emergent impairment Problem Solving: Impaired Problem Solving Impairment: Functional basic       Comprehension  Auditory Comprehension Overall Auditory Comprehension: Appears within functional limits for tasks assessed    Expression Expression Primary Mode of Expression: Verbal Verbal Expression Overall Verbal Expression: Appears within functional limits for tasks assessed   Oral / Motor  Oral Motor/Sensory Function Overall Oral Motor/Sensory Function: Moderate impairment Facial ROM: Reduced left;Suspected CN VII (facial) dysfunction Facial Symmetry: Abnormal symmetry left;Suspected CN VII (facial) dysfunction Facial Strength: Reduced left;Suspected CN VII (facial) dysfunction Facial Sensation: Reduced left;Suspected CN V (Trigeminal) dysfunction Lingual ROM: Within Functional Limits Lingual Symmetry: Within Functional Limits Lingual Strength: Within Functional Limits Velum: Within Functional Limits Mandible: Within Functional Limits Motor Speech Overall Motor Speech: Impaired Respiration: Within functional limits Phonation: Normal Resonance: Within functional limits Articulation: Impaired Level of Impairment: Conversation Intelligibility: Intelligible Motor Planning: Witnin functional limits Motor Speech Errors: Not applicable Effective Techniques: Slow rate   GO                    Germain Osgood 05/07/2017, 9:56 AM   Germain Osgood, M.A. CCC-SLP [phone]

## 2017-05-07 NOTE — Progress Notes (Signed)
PROGRESS NOTE    Theresa Hampton  HAL:[id number] DOB: 02-20-1920 DOA: 05/06/2017 PCP: Cassandria Anger, MD   Chief Complaint  Patient presents with  . Weakness    Brief Narrative:  83 y.o. female with history of hypertension, hyperlipidemia, history of PUD, vitamin D and B12 deficiency, osteoporosis, hard of hearing who was in his usual state of health until about 1 week ago, when she began having some difficulty walking, with increasing falls 2 days prior to admission.  Admitted for CVA.   Assessment & Plan   Acute/subacute CVA -presented with recurrent falls, left facial droop -CT head: negative for acute intracranial abnormality -MRI brain: Subacute nonhemorrhagic linear infarct involving the right paramedian pons. -MRA head/neck: Mild stenosis at the origin of the carotid artery bilaterally. Moderate stenosis at the origin of right vertebral artery. Moderate stenosis left superior cerebellar artery. -LDL 133, hemoglobin A1c 5.3 -Echocardiogram pending -Continue Plavix, statin -PT, OT evaluations pending -Speech consulted, recommending inpatient rehabilitation -Neurology consulted and appreciated -Will consult inpatient rehabilitation  Essential hypertension -Allow for permissive hypertension given CVA -Currently on amlodipine  Hyperlipidemia -Continue statin  Anxiety -Continue Remeron, Klonopin as needed  Osteoporosis/Vitamin D deficiency  -continue supplementation  B12 deficiency -Continue supplementation   Sacral fracture -Likely secondary to fall -X-ray showed distal sacral fracture, uncertain age -As above, PT consulted -Continue pain control  DVT Prophylaxis  heparin  Code Status: Full  Family Communication: None at bedside  Disposition Plan: admitted, pending CVA workup. dispo pending- possibly CVA vs SNF  Consultants Neurology  Procedures  None  Antibiotics   Anti-infectives (From admission, onward)   None      Subjective:    Theresa Hampton seen and examined today. Would like eat, feels very hungry today. Denies current chest pain, shortness breath, abdominal pain, nausea vomiting, diarrhea or constipation, dizziness or headache.  Objective:   Vitals:   05/06/17 2031 05/07/17 0138 05/07/17 0435 05/07/17 0543  BP: (!) 163/58 122/61  (!) 143/74  Pulse: 96 91  (!) 102  Resp: 18 18  18   Temp: 98.5 F (36.9 C) 98.6 F (37 C)  99.1 F (37.3 C)  TempSrc: Oral Oral  Oral  SpO2: 98% 94%  94%  Weight: 56.5 kg (124 lb 9 oz)  57.4 kg (126 lb 8.7 oz)   Height: 5\' 5"  (1.651 m)       Intake/Output Summary (Last 24 hours) at 05/07/2017 1102 Last data filed at 05/07/2017 0300 Gross per 24 hour  Intake 1276.25 ml  Output 950 ml  Net 326.25 ml   Filed Weights   05/06/17 0849 05/06/17 2031 05/07/17 0435  Weight: 52.2 kg (115 lb) 56.5 kg (124 lb 9 oz) 57.4 kg (126 lb 8.7 oz)    Exam  General: Well developed, elderly, NAD  HEENT: [place], periorbital bruising, mucous membranes moist.   Cardiovascular: S1 S2 auscultated, no rubs, murmurs or gallops. Regular rate and rhythm.  Respiratory: Clear to auscultation bilaterally with equal chest rise  Abdomen: Soft, nontender, nondistended, + bowel sounds  Extremities: warm dry without cyanosis clubbing or edema  Neuro: AAOx3, left sided facial droop, mildly dysathric, mild left sided weakness  Psych: Normal affect and demeanor with intact judgement and insight   Data Reviewed: I have personally reviewed following labs and imaging studies  CBC: Recent Labs  Lab 05/06/17 0857 05/06/17 0916 05/07/17 0330  WBC 9.1  --  8.2  NEUTROABS 7.1  --   --   HGB 12.2 12.2 11.3*  HCT  36.7 36.0 34.8*  MCV 87.8  --  88.3  PLT 257  --  694   Basic Metabolic Panel: Recent Labs  Lab 05/06/17 0857 05/06/17 0916 05/07/17 0330  NA 140 143 140  K 3.5 3.6 3.5  CL 104 106 108  CO2 25  --  22  GLUCOSE 111* 108* 87  BUN 10 10 9   CREATININE 0.79 0.60 0.78  CALCIUM 9.2   --  8.5*   GFR: Estimated Creatinine Clearance: 37.9 mL/min (by C-G formula based on SCr of 0.78 mg/dL). Liver Function Tests: Recent Labs  Lab 05/06/17 0857 05/07/17 0330  AST 19 15  ALT 14 11*  ALKPHOS 80 71  BILITOT 0.9 0.9  PROT 7.5 6.3*  ALBUMIN 3.5 3.0*   No results for input(s): LIPASE, AMYLASE in the last 168 hours. No results for input(s): AMMONIA in the last 168 hours. Coagulation Profile: Recent Labs  Lab 05/06/17 0857  INR 1.01   Cardiac Enzymes: Recent Labs  Lab 05/06/17 1616  TROPONINI <0.03   BNP (last 3 results) No results for input(s): PROBNP in the last 8760 hours. HbA1C: Recent Labs    05/07/17 0330  HGBA1C 5.3   CBG: No results for input(s): GLUCAP in the last 168 hours. Lipid Profile: Recent Labs    05/07/17 0330  CHOL 217*  HDL 65  LDLCALC 133*  TRIG 94  CHOLHDL 3.3   Thyroid Function Tests: No results for input(s): TSH, T4TOTAL, FREET4, T3FREE, THYROIDAB in the last 72 hours. Anemia Panel: No results for input(s): VITAMINB12, FOLATE, FERRITIN, TIBC, IRON, RETICCTPCT in the last 72 hours. Urine analysis:    Component Value Date/Time   COLORURINE STRAW (A) 05/06/2017 1056   APPEARANCEUR CLEAR 05/06/2017 1056   LABSPEC 1.006 05/06/2017 1056   PHURINE 5.0 05/06/2017 1056   GLUCOSEU NEGATIVE 05/06/2017 1056   GLUCOSEU NEGATIVE 08/01/2016 1530   HGBUR NEGATIVE 05/06/2017 [place] 05/06/2017 [place] 05/06/2017 1056   PROTEINUR NEGATIVE 05/06/2017 1056   UROBILINOGEN 0.2 08/01/2016 1530   NITRITE NEGATIVE 05/06/2017 1056   LEUKOCYTESUR NEGATIVE 05/06/2017 1056   Sepsis Labs: @LABRCNTIP (procalcitonin:4,lacticidven:4)  )No results found for this or any previous visit (from the past 240 hour(s)).    Radiology Studies: Dg Sacrum/coccyx  Result Date: 05/06/2017 CLINICAL DATA:  Buttock pain EXAM: SACRUM AND COCCYX - 2+ VIEW COMPARISON:  None. FINDINGS: Pelvic ring is intact. Bilateral hip  replacement is noted. Irregularity is noted in the distal sacrum consistent with prior fracture. This is of indeterminate age. No other focal abnormality is noted. Marland Kitchen IMPRESSION: Distal sacral fracture of uncertain age. Electronically Signed   By: Inez Catalina M.D.   On: 05/06/2017 12:43   Ct Head Wo Contrast  Result Date: 05/06/2017 CLINICAL DATA:  83 year old female with a history of fall and bruising on the forehead EXAM: CT HEAD WITHOUT CONTRAST CT MAXILLOFACIAL WITHOUT CONTRAST CT CERVICAL SPINE WITHOUT CONTRAST TECHNIQUE: Multidetector CT imaging of the head, cervical spine, and maxillofacial structures were performed using the standard protocol without intravenous contrast. Multiplanar CT image reconstructions of the cervical spine and maxillofacial structures were also generated. COMPARISON:  04/30/2007 FINDINGS: CT HEAD FINDINGS Brain: No acute intracranial hemorrhage. No midline shift or mass effect. Similar appearance of mild brain volume loss. Unremarkable configuration the ventricles. Gray-white differentiation maintained. Confluent hypodensity in the periventricular white matter, similar prior. Vascular: Calcifications of anterior circulation. Skull: No skull fracture. Soft tissue swelling in the left supraorbital region. Other: Moderate opacification of  ethmoid air cells with minimal sphenoid sinus mucoperiosteal thickening. CT MAXILLOFACIAL FINDINGS Osseous: No fracture of the mid face. No mandible fracture. Bilateral condyles appear located. Limited evaluation at the dentition secondary to hardware. Orbits: Bilateral lens extraction Sinuses: Moderate opacification of ethmoid air cells. Trace mucoperiosteal thickening of the bilateral maxillary sinuses and the sphenoid sinuses. Soft tissues: Soft tissue swelling in the left supraorbital region. CT CERVICAL SPINE FINDINGS Alignment: No subluxation of the cervical elements. Trace anterolisthesis of C3 on C4 and C4 on C5. Facets maintain alignment.  Craniocervical junction unremarkable with no skullbase fracture. Skull base and vertebrae: No skullbase fracture with the craniocervical junction aligned. No fracture of the vertebral bodies identified. Vertebral body heights maintained. Soft tissues and spinal canal: No canal hematoma identified. No soft tissue swelling or asymmetry of the cervical soft tissues. Dense calcifications of the bilateral carotid. Disc levels: Multilevel degenerative disc disease of the cervical spine. Greatest degree of disc space narrowing at C5-C6 with uncovertebral joint disease and small posterior disc osteophyte complex. More moderate disc space narrowing at C6-C7 with uncovertebral joint disease. Mild facet hypertrophy present throughout the cervical spine which is most pronounced on the right at C3-C4 and C4-C5. On the left there is ankylosis of the C2-C3 facet and hypertrophy at the C3-C4 and C4-C5 facet. The changes result in the greatest degree of foraminal narrowing on the left at C5-C6. No facet fracture. Upper chest: Upper chest not imaged Other: None IMPRESSION: Head CT: CT negative for acute intracranial abnormality. Evidence of chronic microvascular ischemic disease. Maxillofacial CT: No acute facial fracture. Soft tissue swelling in the left supraorbital scalp without underlying fracture. No radiopaque foreign body. Moderate paranasal sinus disease predominantly involving the ethmoid air cells. Cervical CT: CT negative for acute fracture malalignment of the cervical spine. Greatest degree of disc disease present at C5-C6 with associated facet disease resulting in mild a moderate left-sided foraminal narrowing. Trace anterolisthesis of C3 on C4 and C4 on C5, likely degenerative. Carotid calcifications. Electronically Signed   By: Corrie Mckusick D.O.   On: 05/06/2017 10:30   Ct Cervical Spine Wo Contrast  Result Date: 05/06/2017 CLINICAL DATA:  83 year old female with a history of fall and bruising on the forehead  EXAM: CT HEAD WITHOUT CONTRAST CT MAXILLOFACIAL WITHOUT CONTRAST CT CERVICAL SPINE WITHOUT CONTRAST TECHNIQUE: Multidetector CT imaging of the head, cervical spine, and maxillofacial structures were performed using the standard protocol without intravenous contrast. Multiplanar CT image reconstructions of the cervical spine and maxillofacial structures were also generated. COMPARISON:  04/30/2007 FINDINGS: CT HEAD FINDINGS Brain: No acute intracranial hemorrhage. No midline shift or mass effect. Similar appearance of mild brain volume loss. Unremarkable configuration the ventricles. Gray-white differentiation maintained. Confluent hypodensity in the periventricular white matter, similar prior. Vascular: Calcifications of anterior circulation. Skull: No skull fracture. Soft tissue swelling in the left supraorbital region. Other: Moderate opacification of ethmoid air cells with minimal sphenoid sinus mucoperiosteal thickening. CT MAXILLOFACIAL FINDINGS Osseous: No fracture of the mid face. No mandible fracture. Bilateral condyles appear located. Limited evaluation at the dentition secondary to hardware. Orbits: Bilateral lens extraction Sinuses: Moderate opacification of ethmoid air cells. Trace mucoperiosteal thickening of the bilateral maxillary sinuses and the sphenoid sinuses. Soft tissues: Soft tissue swelling in the left supraorbital region. CT CERVICAL SPINE FINDINGS Alignment: No subluxation of the cervical elements. Trace anterolisthesis of C3 on C4 and C4 on C5. Facets maintain alignment. Craniocervical junction unremarkable with no skullbase fracture. Skull base and vertebrae: No skullbase fracture with  the craniocervical junction aligned. No fracture of the vertebral bodies identified. Vertebral body heights maintained. Soft tissues and spinal canal: No canal hematoma identified. No soft tissue swelling or asymmetry of the cervical soft tissues. Dense calcifications of the bilateral carotid. Disc levels:  Multilevel degenerative disc disease of the cervical spine. Greatest degree of disc space narrowing at C5-C6 with uncovertebral joint disease and small posterior disc osteophyte complex. More moderate disc space narrowing at C6-C7 with uncovertebral joint disease. Mild facet hypertrophy present throughout the cervical spine which is most pronounced on the right at C3-C4 and C4-C5. On the left there is ankylosis of the C2-C3 facet and hypertrophy at the C3-C4 and C4-C5 facet. The changes result in the greatest degree of foraminal narrowing on the left at C5-C6. No facet fracture. Upper chest: Upper chest not imaged Other: None IMPRESSION: Head CT: CT negative for acute intracranial abnormality. Evidence of chronic microvascular ischemic disease. Maxillofacial CT: No acute facial fracture. Soft tissue swelling in the left supraorbital scalp without underlying fracture. No radiopaque foreign body. Moderate paranasal sinus disease predominantly involving the ethmoid air cells. Cervical CT: CT negative for acute fracture malalignment of the cervical spine. Greatest degree of disc disease present at C5-C6 with associated facet disease resulting in mild a moderate left-sided foraminal narrowing. Trace anterolisthesis of C3 on C4 and C4 on C5, likely degenerative. Carotid calcifications. Electronically Signed   By: Corrie Mckusick D.O.   On: 05/06/2017 10:30   [name] Neck W Wo Contrast  Result Date: 05/06/2017 CLINICAL DATA:  Stroke right pons EXAM: MRA NECK WITHOUT AND WITH CONTRAST MRA HEAD WITHOUT CONTRAST TECHNIQUE: Multiplanar and multiecho pulse sequences of the neck were obtained without and with intravenous contrast. Angiographic images of the neck were obtained using MRA technique without and with intravenous contast.; Angiographic images of the Circle of Willis were obtained using MRA technique without intravenous contrast. CONTRAST:  57mL MULTIHANCE GADOBENATE DIMEGLUMINE 529 MG/ML IV SOLN COMPARISON:  MRI  head 05/06/2017 FINDINGS: MRA NECK FINDINGS Normal aortic arch. Antegrade flow in the carotid and vertebral arteries bilaterally. Atherosclerotic disease and mild stenosis at the origin of the internal carotid artery bilaterally. Moderate stenosis at the origin of the right vertebral artery. Origin of the left vertebral arteries not included on the study and not evaluated. Both vertebral arteries are patent to the basilar without additional stenosis. MRA HEAD FINDINGS Both vertebral arteries widely patent to the basilar. PICA patent bilaterally. Basilar widely patent. Moderate stenosis left superior cerebellar artery. Right superior cerebellar arteries patent. Mild stenosis right posterior cerebral artery. Left posterior cerebral artery widely patent. Internal carotid artery widely patent bilaterally without stenosis. Anterior and middle cerebral arteries widely patent bilaterally without stenosis. Negative for cerebral aneurysm. IMPRESSION: Mild stenosis at the origin of the carotid artery bilaterally Moderate stenosis at the origin of the right vertebral artery. Origin of left vertebral not included on the study. Moderate stenosis left superior cerebellar artery. Mild stenosis right posterior cerebral artery. Basilar artery widely patent. Electronically Signed   By: Franchot Gallo M.D.   On: 05/06/2017 19:34   Mr Brain Wo Contrast  Result Date: 05/06/2017 CLINICAL DATA:  Focal neuro deficit, greater than 6 hours, stroke suspected. Left-sided weakness and facial droop. EXAM: MRI HEAD WITHOUT CONTRAST TECHNIQUE: Multiplanar, multiecho pulse sequences of the brain and surrounding structures were obtained without intravenous contrast. COMPARISON:  CT head without contrast from the same day. FINDINGS: Brain: The diffusion-weighted images demonstrate a right paramedian pontine infarct measuring 14 mm  cephalo caudad. No acute supratentorial infarct is present. Advanced atrophy and confluent diffuse white matter  disease is present otherwise. There is a remote lacunar infarct involving the right lentiform nucleus. Ischemic changes are present in the thalami bilaterally. Mild white matter changes extend into the brainstem otherwise. T2 signal changes associated with the acute/ subacute infarct. The cerebellum is normal. Vascular: Flow is present in the major intracranial arteries. Skull and upper cervical spine: The skullbase is within normal limits. Midline sagittal structures are unremarkable. The craniocervical junction is within normal limits. Sinuses/Orbits: Diffuse opacification is present in the ethmoid air cells bilaterally. Right maxillary antrostomy is noted. There is mild mucosal thickening involving the maxillary sinuses bilaterally. Anterior sphenoid mucosal thickening is present. Mild mucosal thickening is present in the frontal sinuses bilaterally. There is some fluid in the mastoid air cells bilaterally. No obstructing nasopharyngeal lesion is present. IMPRESSION: 1. Acute/subacute nonhemorrhagic linear infarct involving the right paramedian pons, corresponding with the patient's left-sided weakness. 2. No acute supratentorial infarct. 3. Advanced atrophy and diffuse white matter disease likely reflects the sequela of chronic microvascular ischemia. 4. Moderate paranasal sinus disease, predominantly involving the ethmoids. Electronically Signed   By: San Morelle M.D.   On: 05/06/2017 12:34   [name] Head Wo Contrast  Result Date: 05/06/2017 CLINICAL DATA:  Stroke right pons EXAM: MRA NECK WITHOUT AND WITH CONTRAST MRA HEAD WITHOUT CONTRAST TECHNIQUE: Multiplanar and multiecho pulse sequences of the neck were obtained without and with intravenous contrast. Angiographic images of the neck were obtained using MRA technique without and with intravenous contast.; Angiographic images of the Circle of Willis were obtained using MRA technique without intravenous contrast. CONTRAST:  45mL MULTIHANCE  GADOBENATE DIMEGLUMINE 529 MG/ML IV SOLN COMPARISON:  MRI head 05/06/2017 FINDINGS: MRA NECK FINDINGS Normal aortic arch. Antegrade flow in the carotid and vertebral arteries bilaterally. Atherosclerotic disease and mild stenosis at the origin of the internal carotid artery bilaterally. Moderate stenosis at the origin of the right vertebral artery. Origin of the left vertebral arteries not included on the study and not evaluated. Both vertebral arteries are patent to the basilar without additional stenosis. MRA HEAD FINDINGS Both vertebral arteries widely patent to the basilar. PICA patent bilaterally. Basilar widely patent. Moderate stenosis left superior cerebellar artery. Right superior cerebellar arteries patent. Mild stenosis right posterior cerebral artery. Left posterior cerebral artery widely patent. Internal carotid artery widely patent bilaterally without stenosis. Anterior and middle cerebral arteries widely patent bilaterally without stenosis. Negative for cerebral aneurysm. IMPRESSION: Mild stenosis at the origin of the carotid artery bilaterally Moderate stenosis at the origin of the right vertebral artery. Origin of left vertebral not included on the study. Moderate stenosis left superior cerebellar artery. Mild stenosis right posterior cerebral artery. Basilar artery widely patent. Electronically Signed   By: Franchot Gallo M.D.   On: 05/06/2017 19:34   Ct Maxillofacial Wo Contrast  Result Date: 05/06/2017 CLINICAL DATA:  83 year old female with a history of fall and bruising on the forehead EXAM: CT HEAD WITHOUT CONTRAST CT MAXILLOFACIAL WITHOUT CONTRAST CT CERVICAL SPINE WITHOUT CONTRAST TECHNIQUE: Multidetector CT imaging of the head, cervical spine, and maxillofacial structures were performed using the standard protocol without intravenous contrast. Multiplanar CT image reconstructions of the cervical spine and maxillofacial structures were also generated. COMPARISON:  04/30/2007 FINDINGS:  CT HEAD FINDINGS Brain: No acute intracranial hemorrhage. No midline shift or mass effect. Similar appearance of mild brain volume loss. Unremarkable configuration the ventricles. Gray-white differentiation maintained. Confluent hypodensity in the  periventricular white matter, similar prior. Vascular: Calcifications of anterior circulation. Skull: No skull fracture. Soft tissue swelling in the left supraorbital region. Other: Moderate opacification of ethmoid air cells with minimal sphenoid sinus mucoperiosteal thickening. CT MAXILLOFACIAL FINDINGS Osseous: No fracture of the mid face. No mandible fracture. Bilateral condyles appear located. Limited evaluation at the dentition secondary to hardware. Orbits: Bilateral lens extraction Sinuses: Moderate opacification of ethmoid air cells. Trace mucoperiosteal thickening of the bilateral maxillary sinuses and the sphenoid sinuses. Soft tissues: Soft tissue swelling in the left supraorbital region. CT CERVICAL SPINE FINDINGS Alignment: No subluxation of the cervical elements. Trace anterolisthesis of C3 on C4 and C4 on C5. Facets maintain alignment. Craniocervical junction unremarkable with no skullbase fracture. Skull base and vertebrae: No skullbase fracture with the craniocervical junction aligned. No fracture of the vertebral bodies identified. Vertebral body heights maintained. Soft tissues and spinal canal: No canal hematoma identified. No soft tissue swelling or asymmetry of the cervical soft tissues. Dense calcifications of the bilateral carotid. Disc levels: Multilevel degenerative disc disease of the cervical spine. Greatest degree of disc space narrowing at C5-C6 with uncovertebral joint disease and small posterior disc osteophyte complex. More moderate disc space narrowing at C6-C7 with uncovertebral joint disease. Mild facet hypertrophy present throughout the cervical spine which is most pronounced on the right at C3-C4 and C4-C5. On the left there is  ankylosis of the C2-C3 facet and hypertrophy at the C3-C4 and C4-C5 facet. The changes result in the greatest degree of foraminal narrowing on the left at C5-C6. No facet fracture. Upper chest: Upper chest not imaged Other: None IMPRESSION: Head CT: CT negative for acute intracranial abnormality. Evidence of chronic microvascular ischemic disease. Maxillofacial CT: No acute facial fracture. Soft tissue swelling in the left supraorbital scalp without underlying fracture. No radiopaque foreign body. Moderate paranasal sinus disease predominantly involving the ethmoid air cells. Cervical CT: CT negative for acute fracture malalignment of the cervical spine. Greatest degree of disc disease present at C5-C6 with associated facet disease resulting in mild a moderate left-sided foraminal narrowing. Trace anterolisthesis of C3 on C4 and C4 on C5, likely degenerative. Carotid calcifications. Electronically Signed   By: Corrie Mckusick D.O.   On: 05/06/2017 10:30     Scheduled Meds: . amLODipine  2.5 mg Oral Daily  . atorvastatin  40 mg Oral q1800  . cholecalciferol  2,000 Units Oral QHS  . clopidogrel  75 mg Oral Daily  . cyanocobalamin  500 mcg Oral Daily  . gabapentin  100 mg Oral QHS  . heparin  5,000 Units Subcutaneous Q8H  . mirtazapine  15 mg Oral QHS   Continuous Infusions: . sodium chloride 75 mL/hr at 05/06/17 2319     LOS: 1 day   Time Spent in minutes   45 minutes  Sahand Gosch D.O. on 05/07/2017 at 11:02 AM  Between 7am to 7pm - Pager - [phone]  After 7pm go to [email or web address] - password [email or web address]  And look for the night coverage person covering for me after hours  [facility] Office  [phone]

## 2017-05-07 NOTE — PMR Pre-admission (Signed)
PMR Admission Coordinator Pre-Admission Assessment  Patient: Theresa Hampton is an 83 y.o., female MRN: [id number] DOB: [birth year] Height: 5\' 5"  (165.1 cm) Weight: 60.1 kg (132 lb 7.9 oz)             Insurance Information HMO:     PPO:      PCP:      IPA:      80/20: yes     OTHER: no HMO PRIMARY: Medicare a and b      Policy#: [id number]      Subscriber: pt Benefits:  Phone #: passport one online     Name: 05/07/2017 Eff. Date: 04/08/1987     Deduct: $1364      Out of Pocket Max: none      Life Max: none CIR: 100%      SNF: 20 full days Outpatient: 80%     Co-Pay: 20% Home Health: 100%      Co-Pay: none DME: 80%     Co-Pay: 20% Providers: pt choice  SECONDARY: [place]      Policy#: [id number]      Subscriber: pt  Medicaid Application Date:       Case Manager:  Disability Application Date:       Case Worker:   Emergency Contact Information Contact Information    Name Relation Home Work [place] Daughter [phone]  [phone]   Theresa Hampton Daughter [phone]  [phone]     Current Medical History  Patient Admitting Diagnosis:  Right pontine CVA  History of Present Illness:  HPI: Theresa Hampton is a 83 y.o. female with history of HTN, CKD, OA, B-12 deficiency, one week history of difficulty walking progressing to increased falls. History taken from chart review and patient.  She was admitted on 05/06/17 AM after found by family with left  facial droop. MRI brain reviewed, showing right pontine CVA.  Per report MRI/MRA right pontine stroke with moderate stenosis at origin of R-VA and moderate stenosis L-SCA. Neurology recommended changing ASA to Plavix for stroke likely due to SVD. Work up and therapy evaluations underway.  Speech therapy evaluation revealed deficits in awareness, problem solving and attention.  She was independent with ADLs and mobility PTA and used walker when tired. Baked a cake for the holidays. Has had an aide (since [facility] 2016) who  comes in for a few hours daily?   Total: 6 NIHSS  Past Medical History  Past Medical History:  Diagnosis Date  . Allergic rhinitis   . Arthritis    "hips" (05/08/2017)  . B12 deficiency anemia   . Chronic sinusitis   . Daily headache    "q am" (05/08/2017)  . History of transfusion 2004   "w/1st hip OR" (05/08/2017)  . Hyperlipidemia   . Hypertension   . Iron deficiency anemia   . Leg cramps   . Osteoarthritis    Dr. Maureen Ralphs  . Osteoporosis   . Peptic ulcer disease 2003  . PONV (postoperative nausea and vomiting)   . Pyelonephritis, acute 2014  . Seasonal allergies   . Seizures (HCC)    x1 with Lidocaine  . Stroke ([place]) 05/05/2017  . Vitamin B12 deficiency   . Vitamin D deficiency     Family History  family history includes Arthritis in her mother; Cancer in her sister; Hypertension in her unknown relative.  Prior Rehab/Hospitalizations:  Has the patient had major surgery during 100 days prior to admission? No  Current Medications   Current Facility-Administered  Medications:  .  amLODipine (NORVASC) tablet 2.5 mg, 2.5 mg, Oral, Daily, Rondel Jumbo, PA-C, 2.5 mg at 05/09/17 1008 .  atorvastatin (LIPITOR) tablet 40 mg, 40 mg, Oral, q1800, Costello, Mary A, NP, 40 mg at 05/08/17 1828 .  cholecalciferol (VITAMIN D) tablet 2,000 Units, 2,000 Units, Oral, QHS, Rondel Jumbo, PA-C, 2,000 Units at 05/08/17 2148 .  clonazePAM (KLONOPIN) disintegrating tablet 0.25 mg, 0.25 mg, Oral, BID PRN, Rondel Jumbo, PA-C, 0.25 mg at 05/09/17 9798 .  clopidogrel (PLAVIX) tablet 75 mg, 75 mg, Oral, Daily, Rondel Jumbo, PA-C, 75 mg at 05/09/17 1008 .  cyanocobalamin tablet 500 mcg, 500 mcg, Oral, Daily, Rondel Jumbo, PA-C, 500 mcg at 05/09/17 1008 .  diclofenac sodium (VOLTAREN) 1 % transdermal gel 2 g, 2 g, Topical, QID, Mikhail, McGregor, DO, 2 g at 05/08/17 2149 .  gabapentin (NEURONTIN) capsule 100 mg, 100 mg, Oral, QHS, Rondel Jumbo, PA-C, 100 mg at 05/08/17 2148 .  heparin  injection 5,000 Units, 5,000 Units, Subcutaneous, Q8H, Rondel Jumbo, PA-C, 5,000 Units at 05/09/17 0547 .  hydrALAZINE (APRESOLINE) injection 5-10 mg, 5-10 mg, Intravenous, Q8H PRN, Rondel Jumbo, PA-C .  HYDROcodone-acetaminophen (NORCO) 7.5-325 MG per tablet 1 tablet, 1 tablet, Oral, Q4H PRN, Cristal Ford, DO, 1 tablet at 05/09/17 1008 .  mirtazapine (REMERON) tablet 15 mg, 15 mg, Oral, QHS, Rondel Jumbo, PA-C, 15 mg at 05/08/17 2148 .  senna-docusate (Senokot-S) tablet 1 tablet, 1 tablet, Oral, QHS PRN, Rondel Jumbo, PA-C  Patients Current Diet: DIET DYS 2 Room service appropriate? Yes; Fluid consistency: Thin  Precautions / Restrictions Precautions Precautions: Fall Precaution Comments: L painful wrist Restrictions Weight Bearing Restrictions: No Other Position/Activity Restrictions: maintained NWB rhrough L hand due to pain  Has the patient had 2 or more falls or a fall with injury in the past year?No  Prior Activity Level Limited Community (1-2x/wk): only used RW when fatigued or at night; does not drive  Development worker, international aid / Equipment Home Assistive Devices/Equipment: Eyeglasses, Dentures (specify type), Environmental consultant (specify type), Bedside commode/3-in-1, Hand-held shower hose, Shower chair without back, Hearing aid Home Equipment: Environmental consultant - 2 wheels, Walker - 4 wheels, [place] - single point, Grab bars - tub/shower, Shower seat  Prior Device Use: Indicate devices/aids used by the patient prior to current illness, exacerbation or injury? Walker  Prior Functional Level Prior Function Level of Independence: Independent Comments: Pt ambulated without an AD unless she was fatigued or late at night to walk to the bathroom for safety. independently completed ADL tasks.   Self Care: Did the patient need help bathing, dressing, using the toilet or eating?  Independent  Indoor Mobility: Did the patient need assistance with walking from room to room (with or without device)?  Independent  Stairs: Did the patient need assistance with internal or external stairs (with or without device)? Independent  Functional Cognition: Did the patient need help planning regular tasks such as shopping or remembering to take medications? Independent  Current Functional Level Cognition  Arousal/Alertness: Awake/alert Overall Cognitive Status: Impaired/Different from baseline Current Attention Level: Sustained Orientation Level: Oriented X4 Safety/Judgement: Decreased awareness of safety General Comments: Pt with appropriate responses. Pt requiring verbal cues for bed mobility and ambulation. Pt with periods of decreased verbalization and staring though would respond when spoken to. This was combined with light headedness though daughter reports that she had periods like this at home PTA Attention: Sustained, Selective Sustained Attention: Appears intact Selective Attention: Impaired Selective  Attention Impairment: Functional basic, Verbal basic Awareness: Impaired Awareness Impairment: Emergent impairment Problem Solving: Impaired Problem Solving Impairment: Functional basic    Extremity Assessment (includes Sensation/Coordination)  Upper Extremity Assessment: LUE deficits/detail, Defer to OT evaluation LUE Deficits / Details: able to lift and hold LUE against gravity to ~ 90 degrees shoulder flexion. decreased grasp at left hand.  LUE Coordination: decreased fine motor, decreased gross motor(difficulty using functionally at this time)  Lower Extremity Assessment: LLE deficits/detail LLE Deficits / Details: LLE 4-/5 generally.     ADLs  Overall ADL's : Needs assistance/impaired Eating/Feeding: Minimal assistance, Sitting(modified diet) Grooming: Moderate assistance, Sitting Upper Body Bathing: Sitting, Moderate assistance Lower Body Bathing: Maximal assistance, Sit to/from stand Upper Body Dressing : Maximal assistance, Sitting Lower Body Dressing: Maximal assistance,  Sit to/from stand Toilet Transfer: Maximal assistance, Stand-pivot Toileting- Clothing Manipulation and Hygiene: Maximal assistance Functional mobility during ADLs: Maximal assistance    Mobility  Overal bed mobility: Needs Assistance Bed Mobility: Rolling, Sidelying to Sit Rolling: Min assist Sidelying to sit: HOB elevated, Min assist Supine to sit: Max assist Sit to supine: Mod assist General bed mobility comments: pt somewhat confused by task of sitting up once in SL. Min instructional cues as well as as min A to elevate trunk away from bed.     Transfers  Overall transfer level: Needs assistance Equipment used: 2 person hand held assist Transfers: Sit to/from Stand, Stand Pivot Transfers Sit to Stand: +2 safety/equipment, Max assist Stand pivot transfers: Max assist, +2 physical assistance, Mod assist General transfer comment: Pt needed max A for power up first time standing. Was able to stand with mod A after 2 trials. Pivoted to [facility] and then to recliner after using BSC, max A +2 for pivot each time with L knee buckling but pt able to step LLE and put some wt on that side. Pt limited by light headedness once she got up.     Ambulation / Gait / Stairs / Wheelchair Mobility  Ambulation/Gait Ambulation/Gait assistance: Mod assist, +2 physical assistance Ambulation Distance (Feet): 15 Feet Assistive device: 2 person hand held assist Gait Pattern/deviations: Step-to pattern, Decreased step length - left, Decreased stance time - right, Narrow base of support, Decreased weight shift to left, Trunk flexed General Gait Details: unable to safely attempt due to light headedness Gait velocity: decreased    Posture / Balance Dynamic Sitting Balance Sitting balance - Comments: pt able to sit EOB without UE support, however requires help with weight-shifting and scooting to EOB.  Balance Overall balance assessment: Needs assistance Sitting-balance support: Feet supported, No upper extremity  supported Sitting balance-Leahy Scale: Fair Sitting balance - Comments: pt able to sit EOB without UE support, however requires help with weight-shifting and scooting to EOB.  Standing balance support: Bilateral upper extremity supported, During functional activity Standing balance-Leahy Scale: Poor Standing balance comment: Pt requiring mod/ max assist to maintain standing balance.     Special needs/care consideration BiPAP/CPAP  N/a CPM  N/a Continuous Drip IV  N/a Dialysis  N/a Life Vest  N/a Oxygen  N/a Special Bed  N/a Trach Size n/a Wound Vac n/a Skin ecchymosis left eye; painful wrist form fall pta                               Bowel mgmt: Last BM 05/07/17 Bladder mgmt: Incontinence, using external catheter Diabetic mgmt  N/a   Previous Home Environment Living Arrangements: Alone  Lives With: Alone Available Help at Discharge: (Sra aide comes 3 times per week) Type of Home: House Home Layout: Two level, Able to live on main level with bedroom/bathroom Home Access: Stairs to enter Entrance Stairs-Number of Steps: 2 Bathroom Shower/Tub: Chiropodist: Handicapped height Bathroom Accessibility: Yes How Accessible: Accessible via walker [place]: Yes Type of Home Care Services: Homehealth aide, Housekeeping, [place] (if known): private pay and family  Discharge Living Setting Plans for Discharge Living Setting: Patient's home, Alone Type of Home at Discharge: House Discharge Home Layout: Two level, Able to live on main level with bedroom/bathroom Discharge Home Access: Stairs to enter Entrance Stairs-Rails: None Entrance Stairs-Number of Steps: 2 Discharge Bathroom Shower/Tub: Tub/shower unit Discharge Bathroom Toilet: Handicapped height Discharge Bathroom Accessibility: Yes How Accessible: Accessible via walker Does the patient have any problems obtaining your medications?: No  Social/Family/Support Systems Patient  Roles: Parent Contact Information: daughter, Lelon Frohlich Anticipated Caregiver: daughters Lelon Frohlich and Zigmund Daniel as well as aide, Clarise Cruz Anticipated Ambulance person Information: see above Ability/Limitations of Caregiver: Zigmund Daniel can provide supervision only; Clarise Cruz aide may be able to increase hrs Caregiver Availability: Other (Comment)(daughters Lelon Frohlich and Zigmund Daniel to discuss 24/7 assist with aide and t) Discharge Plan Discussed with Primary Caregiver: Yes Is Caregiver In Agreement with Plan?: Yes Does Caregiver/Family have Issues with Lodging/Transportation while Pt is in Rehab?: No   Daughter, Lelon Frohlich, to discuss with Clarise Cruz, caregiver pta, if she can increase her hours more. Another daughter, Zigmund Daniel can provide supervision.   Goals/Additional Needs Patient/Family Goal for Rehab: min assist PT and OT, Mod I to supervision with SLP Expected length of stay: ELOS 10- 14 days Pt/Family Agrees to Admission and willing to participate: Yes Program Orientation Provided & Reviewed with Pt/Caregiver Including Roles  & Responsibilities: Yes  Decrease burden of Care through IP rehab admission: n/a  Possible need for SNF placement upon discharge: if pt does not progress to supervision min assist level, pt may need short term SNF. Ann, daughter, may be able to increase hours for caregiver support at home during the day with she and her sister filling in for the night shift  Patient Condition: This patient's medical and functional status has changed since the consult dated: 05/07/2017 in which the Rehabilitation Physician determined and documented that the patient's condition is appropriate for intensive rehabilitative care in an inpatient rehabilitation facility. See "History of Present Illness" (above) for medical update. Functional changes are: Currently requiring max assist +2 for transfers. Patient's medical and functional status update has been discussed with the Rehabilitation physician and patient remains appropriate for inpatient  rehabilitation. Will admit to inpatient rehab today.  Preadmission Screen Completed By:  [name], 05/09/2017 11:52 AM ______________________________________________________________________   Discussed status with Dr. Posey Pronto on 05/09/17 at 1152 and received telephone approval for admission today.  Admission Coordinator:  [name], time 1152/Date 05/09/17

## 2017-05-07 NOTE — Progress Notes (Signed)
Pt A&OX3 admitted to unit 3w room 48 with DX's of CVA. Pt has left sided weakness and left facial asymmetry. Speech is clear. VS taken Placed on tele

## 2017-05-07 NOTE — Evaluation (Signed)
Clinical/Bedside Swallow Evaluation Patient Details  Name: Theresa Hampton MRN: [id number] Date of Birth: 1919-10-21  Today's Date: 05/07/2017 Time: SLP Start Time (ACUTE ONLY): 3086 SLP Stop Time (ACUTE ONLY): 0915 SLP Time Calculation (min) (ACUTE ONLY): 23 min  Past Medical History:  Past Medical History:  Diagnosis Date  . Allergic rhinitis   . Anemia    iron deficiency  . Chronic kidney disease    Pyelo 2014  . Chronic sinusitis   . Complication of anesthesia   . History of transfusion   . Hyperlipidemia   . Hypertension   . Leg cramps   . Osteoarthritis    Dr. Maureen Ralphs  . Osteoporosis   . Peptic ulcer disease 2003  . PONV (postoperative nausea and vomiting)   . Seizures (HCC)    x1 with Lidocaine  . Vitamin B12 deficiency   . Vitamin D deficiency    Past Surgical History:  Past Surgical History:  Procedure Laterality Date  . JOINT REPLACEMENT  2004   rt total hip  . THYROIDECTOMY, PARTIAL  "many years ago"  . TOTAL HIP ARTHROPLASTY Left 01/06/2015   Procedure: LEFT TOTAL HIP ARTHROPLASTY ANTERIOR APPROACH;  Surgeon: Gaynelle Arabian, MD;  Location: [facility];  Service: Orthopedics;  Laterality: Left;  . uterine ablation  2008   HPI:  Ptis a 83 y.o.left-handed femalewho has a past medical history of hypertension, hyperlipidemia, peptic ulcer disease, vitamin D and B12 deficiency, and CKD, who was admitted with 2 days of L facial droop and falls. MRI showed ana cute ischemic stroke in the R pons. Pt initially passed the stroke swallow screen but has had fluctuating slurred speech. The screen was therefore repeated and she did not pass. PTA pt lived alone but had a nurse who came almost every day, and her daughters live nearby.   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 SLP  provided Mod cues for awareness and use of lingual sweep to clear. She appears to have piecemeal swallowing, particularly with thin liquids, although no overt signs of aspiration are noted even with challenging. She has some left-sided anterior spillage with cup sips, which is better managed with use of a straw. Recommend Dys 2 diet and thin liquids. SLP to follow for tolerance and readiness to advance. SLP Visit Diagnosis: Dysphagia, oral phase (R13.11)    Aspiration Risk  Mild aspiration risk    Diet Recommendation Dysphagia 2 (Fine chop);Thin liquid   Liquid Administration via: Straw Medication Administration: Whole meds with puree Supervision: Staff to assist with self feeding;Full supervision/cueing for compensatory strategies Compensations: Slow rate;Small sips/bites;Lingual sweep for clearance of pocketing Postural Changes: Seated upright at 90 degrees    Other  Recommendations Oral Care Recommendations: Oral care BID   Follow up Recommendations Inpatient Rehab      Frequency and Duration min 2x/week  2 weeks       Prognosis Prognosis for Safe Diet Advancement: Good      Swallow Study   General HPI: Ptis a 83 y.o.left-handed femalewho has a past medical history of hypertension, hyperlipidemia, peptic ulcer disease, vitamin D and B12 deficiency, and CKD, who was admitted with 2 days of L facial droop and falls. MRI showed ana cute ischemic stroke in the R pons. Pt initially passed the stroke swallow screen but has had fluctuating slurred speech. The  screen was therefore repeated and she did not pass. PTA pt lived alone but had a nurse who came almost every day, and her daughters live nearby. Type of Study: Bedside Swallow Evaluation Previous Swallow Assessment: none in chart Diet Prior to this Study: NPO Temperature Spikes Noted: No Respiratory Status: Room air History of Recent Intubation: No Behavior/Cognition: Alert;Cooperative;Pleasant mood;Requires cueing Oral  Cavity Assessment: Dry Oral Care Completed by SLP: No Oral Cavity - Dentition: Adequate natural dentition;Dentures, bottom Vision: Functional for self-feeding Self-Feeding Abilities: Able to feed self;Needs assist Patient Positioning: Upright in bed Baseline Vocal Quality: Normal Volitional Cough: Weak Volitional Swallow: Able to elicit    Oral/Motor/Sensory Function Overall Oral Motor/Sensory Function: Moderate impairment Facial ROM: Reduced left;Suspected CN VII (facial) dysfunction Facial Symmetry: Abnormal symmetry left;Suspected CN VII (facial) dysfunction Facial Strength: Reduced left;Suspected CN VII (facial) dysfunction Facial Sensation: Reduced left;Suspected CN V (Trigeminal) dysfunction Lingual ROM: Within Functional Limits Lingual Symmetry: Within Functional Limits Lingual Strength: Within Functional Limits Velum: Within Functional Limits Mandible: Within Functional Limits   Ice Chips Ice chips: Not tested   Thin Liquid Thin Liquid: Impaired Presentation: Cup;Self Fed;Straw Oral Phase Functional Implications: Left anterior spillage;Other (comment)(piecemeal swallowing)    Nectar Thick Nectar Thick Liquid: Not tested   Honey Thick Honey Thick Liquid: Not tested   Puree Puree: Impaired Presentation: Spoon;Self Fed Oral Phase Impairments: Poor awareness of bolus Oral Phase Functional Implications: Left lateral sulci pocketing   Solid   GO   Solid: Impaired Presentation: Self Fed Oral Phase Impairments: Impaired mastication Oral Phase Functional Implications: Impaired mastication;Left lateral sulci pocketing        Germain Osgood 05/07/2017,9:49 AM   Germain Osgood, M.A. CCC-SLP [phone]

## 2017-05-07 NOTE — Evaluation (Signed)
Physical Therapy Evaluation Patient Details Name: Theresa Hampton MRN: [id number] DOB: [birth year] Today's Date: 05/07/2017   History of Present Illness  83 y.o. female with history of HTN, CKD, OA, B-12 deficiency, one week history of difficulty walking progressing to increased falls. She was admitted on 05/06/17 am after found by family with left  facial droop. MRI/MRA brain done revealing Right pontine stroke. Sacral/coccyx X-ray reveals fracture of sacrum. L THA anterior 2016.   Clinical Impression  Pt presents with left-sided weakness and impaired tone (hypotonia) of LUE and LLE, decreased balance, impaired mobility, and pain secondary to above. Pt independent PTA with intermittent use of Ads for safety. Pt tolerated room-distance ambulation with mod assist +2. Pt's home nurse was present in room during session and able to provide detailed information about home environment and pt's prior level of function. Recommending CIR upon discharge, as pt is motivated and demonstrates good rehab potential. Pt will benefit from skilled PT acutely in order to maximize mobility while in hospital setting.     Follow Up Recommendations CIR;Supervision for mobility/OOB    Equipment Recommendations  Other (comment)(to be determined at next venue of care)    Recommendations for Other Services       Precautions / Restrictions Precautions Precautions: Fall Precaution Comments: L painful wrist Restrictions Weight Bearing Restrictions: No Other Position/Activity Restrictions: maintained NWB rhrough L hand due to pain      Mobility  Bed Mobility Overal bed mobility: Needs Assistance Bed Mobility: Rolling;Sidelying to Sit;Sit to Supine Rolling: Min assist Sidelying to sit: HOB elevated;Min assist Sit to supine: Mod assist   General bed mobility comments: Pt requiring VCs for bed mobility and increased time. Pt able to bring legs off of EOB without physical assist. Pt requiring only min assist to  complete log roll. VCs to push up to sit with RUE on bed. Pt utilizes therapist to pull the rest of the way up to sitting with LUE. Pt requiring therapist to swoop legs back onto bed for sit to supine. Pt in bed post-session due to pain at sacrum.   Transfers Overall transfer level: Needs assistance Equipment used: 2 person hand held assist Transfers: Sit to/from Stand Sit to Stand: Mod assist        General transfer comment: STS x1 from EOB. VCs to stand up tall upon standing. RLE with full contact still on bed and pt using bed to help support weight. RLE hyperextended and locked out upon standing.   Ambulation/Gait Ambulation/Gait assistance: Mod assist;+2 physical assistance Ambulation Distance (Feet): 15 Feet Assistive device: 2 person hand held assist Gait Pattern/deviations: Step-to pattern;Decreased step length - left;Decreased stance time - right;Narrow base of support;Decreased weight shift to left;Trunk flexed Gait velocity: decreased   General Gait Details: Pt able to activate spinal extensors to stand upright with VCs, however does not sustain and requires intermittent reminders. Pt demonstrates ability to take larger steps with VCs.   Stairs            Wheelchair Mobility    Modified Rankin (Stroke Patients Only) Modified Rankin (Stroke Patients Only) Pre-Morbid Rankin Score: Slight disability Modified Rankin: Moderately severe disability     Balance Overall balance assessment: Needs assistance Sitting-balance support: Feet supported;No upper extremity supported Sitting balance-Leahy Scale: Fair Sitting balance - Comments: pt able to sit EOB without UE support, however requires help with weight-shifting and scooting to EOB.      Standing balance-Leahy Scale: Poor Standing balance comment: Pt requiring mod assist to maintain  standing balance.                              Pertinent Vitals/Pain Pain Assessment: 0-10 Pain Score: 4  Pain  Location: buttocks Pain Descriptors / Indicators: Aching Pain Intervention(s): Limited activity within patient's tolerance;Monitored during session    Home Living Family/patient expects to be discharged to:: Inpatient rehab Living Arrangements: Alone Available Help at Discharge: Personal care attendant;Available PRN/intermittently;Family(nurse comes several hours/day to help pt) Type of Home: House Home Access: Stairs to enter   [employer] of Steps: 2 Home Layout: Able to live on main level with bedroom/bathroom Home Equipment: Walker - 2 wheels;Walker - 4 wheels;Cane - single point;Grab bars - tub/shower;Shower seat      Prior Function Level of Independence: Independent         Comments: Pt ambulated without an AD unless she was fatigued or late at night to walk to the bathroom for safety. independently completed ADL tasks.      Hand Dominance   Dominant Hand: Right    Extremity/Trunk Assessment   Upper Extremity Assessment Upper Extremity Assessment: LUE deficits/detail;Defer to OT evaluation LUE Deficits / Details: able to lift and hold LUE against gravity to ~ 90 degrees shoulder flexion. decreased grasp at left hand.  LUE Coordination: decreased fine motor;decreased gross motor(difficulty using functionally at this time)    Lower Extremity Assessment Lower Extremity Assessment: LLE deficits/detail LLE Deficits / Details: LLE 4-/5 generally.     Cervical / Trunk Assessment Cervical / Trunk Assessment: Kyphotic  Communication   Communication: HOH  Cognition Arousal/Alertness: Awake/alert Behavior During Therapy: WFL for tasks assessed/performed Overall Cognitive Status: No family/caregiver present to determine baseline cognitive functioning Area of Impairment: Problem solving                   Current Attention Level: Sustained     Safety/Judgement: Decreased awareness of safety Awareness: Emergent Problem Solving: Slow  processing;Requires verbal cues General Comments: Pt with appropriate responses. Pt requiring verbal cues for bed mobility and ambulation.       General Comments General comments (skin integrity, edema, etc.): Pt's nurse present in room during session and aids in giving home environment information.     Exercises     Assessment/Plan    PT Assessment Patient needs continued PT services  PT Problem List Decreased mobility;Decreased strength;Impaired tone;Decreased balance;Pain       PT Treatment Interventions DME instruction;Functional mobility training;Balance training;Patient/family education;Gait training;Therapeutic activities;Neuromuscular re-education;Stair training;Therapeutic exercise    PT Goals (Current goals can be found in the Care Plan section)  Acute Rehab PT Goals Patient Stated Goal: to get back to independence PT Goal Formulation: With patient Time For Goal Achievement: 05/21/17 Potential to Achieve Goals: Good    Frequency Min 4X/week   Barriers to discharge        Co-evaluation               AM-PAC PT "6 Clicks" Daily Activity  Outcome Measure Difficulty turning over in bed (including adjusting bedclothes, sheets and blankets)?: Unable Difficulty moving from lying on back to sitting on the side of the bed? : Unable Difficulty sitting down on and standing up from a chair with arms (e.g., wheelchair, bedside commode, etc,.)?: Unable Help needed moving to and from a bed to chair (including a wheelchair)?: A Lot Help needed walking in hospital room?: A Lot Help needed climbing 3-5 steps with a railing? : Total  6 Click Score: 8    End of Session Equipment Utilized During Treatment: Gait belt Activity Tolerance: Patient tolerated treatment well Patient left: in bed;with call bell/phone within reach;with family/visitor present;with bed alarm set   PT Visit Diagnosis: Hemiplegia and hemiparesis;Unsteadiness on feet (R26.81);Difficulty in walking, not  elsewhere classified (R26.2);Pain Hemiplegia - Right/Left: Left Hemiplegia - dominant/non-dominant: Dominant Pain - Right/Left: Left Pain - part of body: (wrist, sacral area)    Time: 5374-8270 PT Time Calculation (min) (ACUTE ONLY): 29 min   Charges:   PT Evaluation $PT Eval Low Complexity: 1 Low PT Treatments $Neuromuscular Re-education: 8-22 mins   PT G Codes:        Judee Clara, SPT  Judee Clara 05/07/2017, 4:07 PM

## 2017-05-07 NOTE — Consult Note (Signed)
Physical Medicine and Rehabilitation Consult   Reason for Consult: Right pontine stroke with falls and facial weakness  Referring Physician: Dr. Erlinda Hong   HPI: Theresa Hampton is a 83 y.o. female with history of HTN, CKD, OA, B-12 deficiency, one week history of difficulty walking progressing to increased falls. History taken from chart review and patient.  She was admitted on 05/06/17 AM after found by family with left  facial droop. MRI brain reviewed, showing right pontine CVA.  Per report MRI/MRA right pontine stroke with moderate stenosis at origin of R-VA and moderate stenosis L-SCA. Neurology recommended changing ASA to Plavix for stroke likely due to SVD. Work up and therapy evaluations underway.  Speech therapy evaluation revealed deficits in awareness, problem solving and attention. CIR recommended due to functional deficits.   She was independent with ADLs and mobility PTA and used walker when tired. Baked a cake for the holidays. Has had an aide (since [facility] 2016) who comes in for a few hours daily?    Review of Systems  Constitutional: Negative for chills and fever.  HENT: Negative for hearing loss and tinnitus.   Eyes: Negative for blurred vision and double vision.  Respiratory: Negative for shortness of breath.   Cardiovascular: Negative for chest pain and palpitations.  Gastrointestinal: Negative for abdominal pain and heartburn.  Genitourinary: Negative for dysuria.  Musculoskeletal: Positive for joint pain (left wrist pain). Negative for back pain and myalgias.  Neurological: Positive for focal weakness and weakness. Negative for dizziness and headaches.  Psychiatric/Behavioral: The patient does not have insomnia.   All other systems reviewed and are negative.    Past Medical History:  Diagnosis Date  . Allergic rhinitis   . Anemia    iron deficiency  . Chronic kidney disease    Pyelo 2014  . Chronic sinusitis   . Complication of anesthesia   . History of  transfusion   . Hyperlipidemia   . Hypertension   . Leg cramps   . Osteoarthritis    Dr. Maureen Ralphs  . Osteoporosis   . Peptic ulcer disease 2003  . PONV (postoperative nausea and vomiting)   . Seizures (HCC)    x1 with Lidocaine  . Vitamin B12 deficiency   . Vitamin D deficiency     Past Surgical History:  Procedure Laterality Date  . JOINT REPLACEMENT  2004   rt total hip  . THYROIDECTOMY, PARTIAL  "many years ago"  . TOTAL HIP ARTHROPLASTY Left 01/06/2015   Procedure: LEFT TOTAL HIP ARTHROPLASTY ANTERIOR APPROACH;  Surgeon: Gaynelle Arabian, MD;  Location: [facility];  Service: Orthopedics;  Laterality: Left;  . uterine ablation  2008    Family History  Problem Relation Age of Onset  . Arthritis Mother   . Hypertension Unknown   . Cancer Sister     Social History:  Lives alone and independent PTA. Used walker when fatigued and has an aide who helps daily? Family plans on providing supervision after discharge. She reports that  has never smoked. she has never used smokeless tobacco. She reports that she does not drink alcohol or use drugs.    Allergies  Allergen Reactions  . Lidocaine Hcl     seizure    Medications Prior to Admission  Medication Sig Dispense Refill  . amLODipine (NORVASC) 2.5 MG tablet TAKE 1 TABLET (2.5 MG TOTAL) BY MOUTH DAILY. 90 tablet 1  . aspirin EC 81 MG tablet Take 81 mg by mouth daily.    Marland Kitchen  Cholecalciferol 2000 UNITS TABS Take 2 each by mouth at bedtime.    . clonazePAM (KLONOPIN) 0.25 MG disintegrating tablet DISSOLVE ONE TABLET BY MOUTH TWICE A DAY AS NEEDED FOR ANXIETY 60 tablet 2  . cyanocobalamin 500 MCG tablet Take 500 mcg by mouth daily.    Marland Kitchen estradiol (CLIMARA - DOSED IN MG/24 HR) 0.05 mg/24hr patch Place 1 patch (0.05 mg total) onto the skin once a week. 12 patch 1  . fluticasone (FLONASE) 50 MCG/ACT nasal spray Place 1 spray into the nose daily. 16 g 3  . gabapentin (NEURONTIN) 100 MG capsule TAKE 1 CAPSULE (100 MG TOTAL) BY MOUTH AT  BEDTIME. 90 capsule 1  . loratadine (CLARITIN) 10 MG tablet Take 1 tablet (10 mg total) by mouth daily. 100 tablet 2  . LUMIGAN 0.01 % SOLN instill 1 drop IN EACH EYE AT BEDTIME  3  . mirtazapine (REMERON) 15 MG tablet TAKE ONE TABLET BY MOUTH EVERY EVENING BEFORE DINNER AT 4 - 5 PM 90 tablet 1  . traMADol (ULTRAM) 50 MG tablet TAKE 1 TABLET BY MOUTH EVERY 6 HOURS as needed for pain 60 tablet 5  . clobetasol cream (TEMOVATE) 1.47 % Apply 1 application topically 2 (two) times daily. Use sparingly. (Patient not taking: Reported on 05/06/2017) 30 g 2  . metoprolol tartrate (LOPRESSOR) 25 MG tablet Take 1 tablet (25 mg total) by mouth 2 (two) times daily. (Patient not taking: Reported on 05/06/2017) 180 tablet 3  . ranitidine (ZANTAC) 150 MG tablet Take 1 tablet (150 mg total) by mouth at bedtime. (Patient not taking: Reported on 05/06/2017) 30 tablet 5    Home: Home Living Type of Home: House  Lives With: Alone, Other (Comment)(has nurse/therapists that come? dtr lives nearby)  Functional History:   Functional Status:  Mobility:          ADL:    Cognition: Cognition Overall Cognitive Status: No family/caregiver present to determine baseline cognitive functioning Arousal/Alertness: Awake/alert Orientation Level: Oriented X4 Attention: Sustained, Selective Sustained Attention: Appears intact Selective Attention: Impaired Selective Attention Impairment: Functional basic, Verbal basic Awareness: Impaired Awareness Impairment: Emergent impairment Problem Solving: Impaired Problem Solving Impairment: Functional basic Cognition Overall Cognitive Status: No family/caregiver present to determine baseline cognitive functioning  Blood pressure (!) 143/74, pulse (!) 102, temperature 99.1 F (37.3 C), temperature source Oral, resp. rate 18, height 5\' 5"  (1.651 m), weight 57.4 kg (126 lb 8.7 oz), SpO2 94 %. Physical Exam  Nursing note and vitals reviewed. Constitutional: She is oriented  to person, place, and time. She appears well-developed and well-nourished. She appears distressed.  HENT:  Head: Normocephalic.  Mouth/Throat: Oropharynx is clear and moist.  Eyes: Conjunctivae and EOM are normal. Pupils are equal, round, and reactive to light.  Left forehead hematoma with periorbital ecchymosis  Neck: Normal range of motion. Neck supple.  Cardiovascular: Normal rate and regular rhythm. Frequent extrasystoles are present.  Respiratory: Effort normal and breath sounds normal. No respiratory distress. She has no wheezes.  GI: Soft. Bowel sounds are normal. She exhibits no distension. There is no tenderness.  Musculoskeletal:  Left wrist tenderness with ROM and min edema noted laterally.   Neurological: She is alert and oriented to person, place, and time.  Left facial weakness with minimal dysarthria.  Tangential but able to answer orientation question without difficulty.  Able to state today's date and follow simple motor commands.  Motor: RUE: 4-/5 proximal to distal LUE: Not moving due to pain B/l LE: Not willing to moving,  stating she is in too much pain HOH  Skin: Skin is warm and dry. She is not diaphoretic.  Right periorbital ecchymosis   Psychiatric: Her mood appears anxious. Her speech is tangential. She is slowed.    Results for orders placed or performed during the hospital encounter of 05/06/17 (from the past 24 hour(s))  Troponin I     Status: None   Collection Time: 05/06/17  4:16 PM  Result Value Ref Range   Troponin I <0.03 <0.03 ng/mL  Hemoglobin A1c     Status: None   Collection Time: 05/07/17  3:30 AM  Result Value Ref Range   Hgb A1c MFr Bld 5.3 4.8 - 5.6 %   Mean Plasma Glucose 105.41 mg/dL  Lipid panel     Status: Abnormal   Collection Time: 05/07/17  3:30 AM  Result Value Ref Range   Cholesterol 217 (H) 0 - 200 mg/dL   Triglycerides 94 <150 mg/dL   HDL 65 >40 mg/dL   Total CHOL/HDL Ratio 3.3 RATIO   VLDL 19 0 - 40 mg/dL   LDL  Cholesterol 133 (H) 0 - 99 mg/dL  CBC     Status: Abnormal   Collection Time: 05/07/17  3:30 AM  Result Value Ref Range   WBC 8.2 4.0 - 10.5 K/uL   RBC 3.94 3.87 - 5.11 MIL/uL   Hemoglobin 11.3 (L) 12.0 - 15.0 g/dL   HCT 34.8 (L) 36.0 - 46.0 %   MCV 88.3 78.0 - 100.0 fL   MCH 28.7 26.0 - 34.0 pg   MCHC 32.5 30.0 - 36.0 g/dL   RDW 12.9 11.5 - 15.5 %   Platelets 231 150 - 400 K/uL  Comprehensive metabolic panel     Status: Abnormal   Collection Time: 05/07/17  3:30 AM  Result Value Ref Range   Sodium 140 135 - 145 mmol/L   Potassium 3.5 3.5 - 5.1 mmol/L   Chloride 108 101 - 111 mmol/L   CO2 22 22 - 32 mmol/L   Glucose, Bld 87 65 - 99 mg/dL   BUN 9 6 - 20 mg/dL   Creatinine, Ser 0.78 0.44 - 1.00 mg/dL   Calcium 8.5 (L) 8.9 - 10.3 mg/dL   Total Protein 6.3 (L) 6.5 - 8.1 g/dL   Albumin 3.0 (L) 3.5 - 5.0 g/dL   AST 15 15 - 41 U/L   ALT 11 (L) 14 - 54 U/L   Alkaline Phosphatase 71 38 - 126 U/L   Total Bilirubin 0.9 0.3 - 1.2 mg/dL   GFR calc non Af Amer >60 >60 mL/min   GFR calc Af Amer >60 >60 mL/min   Anion gap 10 5 - 15   Dg Sacrum/coccyx  Result Date: 05/06/2017 CLINICAL DATA:  Buttock pain EXAM: SACRUM AND COCCYX - 2+ VIEW COMPARISON:  None. FINDINGS: Pelvic ring is intact. Bilateral hip replacement is noted. Irregularity is noted in the distal sacrum consistent with prior fracture. This is of indeterminate age. No other focal abnormality is noted. Marland Kitchen IMPRESSION: Distal sacral fracture of uncertain age. Electronically Signed   By: Inez Catalina M.D.   On: 05/06/2017 12:43   Ct Head Wo Contrast  Result Date: 05/06/2017 CLINICAL DATA:  83 year old female with a history of fall and bruising on the forehead EXAM: CT HEAD WITHOUT CONTRAST CT MAXILLOFACIAL WITHOUT CONTRAST CT CERVICAL SPINE WITHOUT CONTRAST TECHNIQUE: Multidetector CT imaging of the head, cervical spine, and maxillofacial structures were performed using the standard protocol without intravenous contrast. Multiplanar  CT image reconstructions of the cervical spine and maxillofacial structures were also generated. COMPARISON:  04/30/2007 FINDINGS: CT HEAD FINDINGS Brain: No acute intracranial hemorrhage. No midline shift or mass effect. Similar appearance of mild brain volume loss. Unremarkable configuration the ventricles. Gray-white differentiation maintained. Confluent hypodensity in the periventricular white matter, similar prior. Vascular: Calcifications of anterior circulation. Skull: No skull fracture. Soft tissue swelling in the left supraorbital region. Other: Moderate opacification of ethmoid air cells with minimal sphenoid sinus mucoperiosteal thickening. CT MAXILLOFACIAL FINDINGS Osseous: No fracture of the mid face. No mandible fracture. Bilateral condyles appear located. Limited evaluation at the dentition secondary to hardware. Orbits: Bilateral lens extraction Sinuses: Moderate opacification of ethmoid air cells. Trace mucoperiosteal thickening of the bilateral maxillary sinuses and the sphenoid sinuses. Soft tissues: Soft tissue swelling in the left supraorbital region. CT CERVICAL SPINE FINDINGS Alignment: No subluxation of the cervical elements. Trace anterolisthesis of C3 on C4 and C4 on C5. Facets maintain alignment. Craniocervical junction unremarkable with no skullbase fracture. Skull base and vertebrae: No skullbase fracture with the craniocervical junction aligned. No fracture of the vertebral bodies identified. Vertebral body heights maintained. Soft tissues and spinal canal: No canal hematoma identified. No soft tissue swelling or asymmetry of the cervical soft tissues. Dense calcifications of the bilateral carotid. Disc levels: Multilevel degenerative disc disease of the cervical spine. Greatest degree of disc space narrowing at C5-C6 with uncovertebral joint disease and small posterior disc osteophyte complex. More moderate disc space narrowing at C6-C7 with uncovertebral joint disease. Mild facet  hypertrophy present throughout the cervical spine which is most pronounced on the right at C3-C4 and C4-C5. On the left there is ankylosis of the C2-C3 facet and hypertrophy at the C3-C4 and C4-C5 facet. The changes result in the greatest degree of foraminal narrowing on the left at C5-C6. No facet fracture. Upper chest: Upper chest not imaged Other: None IMPRESSION: Head CT: CT negative for acute intracranial abnormality. Evidence of chronic microvascular ischemic disease. Maxillofacial CT: No acute facial fracture. Soft tissue swelling in the left supraorbital scalp without underlying fracture. No radiopaque foreign body. Moderate paranasal sinus disease predominantly involving the ethmoid air cells. Cervical CT: CT negative for acute fracture malalignment of the cervical spine. Greatest degree of disc disease present at C5-C6 with associated facet disease resulting in mild a moderate left-sided foraminal narrowing. Trace anterolisthesis of C3 on C4 and C4 on C5, likely degenerative. Carotid calcifications. Electronically Signed   By: Corrie Mckusick D.O.   On: 05/06/2017 10:30   Ct Cervical Spine Wo Contrast  Result Date: 05/06/2017 CLINICAL DATA:  83 year old female with a history of fall and bruising on the forehead EXAM: CT HEAD WITHOUT CONTRAST CT MAXILLOFACIAL WITHOUT CONTRAST CT CERVICAL SPINE WITHOUT CONTRAST TECHNIQUE: Multidetector CT imaging of the head, cervical spine, and maxillofacial structures were performed using the standard protocol without intravenous contrast. Multiplanar CT image reconstructions of the cervical spine and maxillofacial structures were also generated. COMPARISON:  04/30/2007 FINDINGS: CT HEAD FINDINGS Brain: No acute intracranial hemorrhage. No midline shift or mass effect. Similar appearance of mild brain volume loss. Unremarkable configuration the ventricles. Gray-white differentiation maintained. Confluent hypodensity in the periventricular white matter, similar prior.  Vascular: Calcifications of anterior circulation. Skull: No skull fracture. Soft tissue swelling in the left supraorbital region. Other: Moderate opacification of ethmoid air cells with minimal sphenoid sinus mucoperiosteal thickening. CT MAXILLOFACIAL FINDINGS Osseous: No fracture of the mid face. No mandible fracture. Bilateral condyles appear located. Limited evaluation at the dentition  secondary to hardware. Orbits: Bilateral lens extraction Sinuses: Moderate opacification of ethmoid air cells. Trace mucoperiosteal thickening of the bilateral maxillary sinuses and the sphenoid sinuses. Soft tissues: Soft tissue swelling in the left supraorbital region. CT CERVICAL SPINE FINDINGS Alignment: No subluxation of the cervical elements. Trace anterolisthesis of C3 on C4 and C4 on C5. Facets maintain alignment. Craniocervical junction unremarkable with no skullbase fracture. Skull base and vertebrae: No skullbase fracture with the craniocervical junction aligned. No fracture of the vertebral bodies identified. Vertebral body heights maintained. Soft tissues and spinal canal: No canal hematoma identified. No soft tissue swelling or asymmetry of the cervical soft tissues. Dense calcifications of the bilateral carotid. Disc levels: Multilevel degenerative disc disease of the cervical spine. Greatest degree of disc space narrowing at C5-C6 with uncovertebral joint disease and small posterior disc osteophyte complex. More moderate disc space narrowing at C6-C7 with uncovertebral joint disease. Mild facet hypertrophy present throughout the cervical spine which is most pronounced on the right at C3-C4 and C4-C5. On the left there is ankylosis of the C2-C3 facet and hypertrophy at the C3-C4 and C4-C5 facet. The changes result in the greatest degree of foraminal narrowing on the left at C5-C6. No facet fracture. Upper chest: Upper chest not imaged Other: None IMPRESSION: Head CT: CT negative for acute intracranial abnormality.  Evidence of chronic microvascular ischemic disease. Maxillofacial CT: No acute facial fracture. Soft tissue swelling in the left supraorbital scalp without underlying fracture. No radiopaque foreign body. Moderate paranasal sinus disease predominantly involving the ethmoid air cells. Cervical CT: CT negative for acute fracture malalignment of the cervical spine. Greatest degree of disc disease present at C5-C6 with associated facet disease resulting in mild a moderate left-sided foraminal narrowing. Trace anterolisthesis of C3 on C4 and C4 on C5, likely degenerative. Carotid calcifications. Electronically Signed   By: Corrie Mckusick D.O.   On: 05/06/2017 10:30   [name] Neck W Wo Contrast  Result Date: 05/06/2017 CLINICAL DATA:  Stroke right pons EXAM: MRA NECK WITHOUT AND WITH CONTRAST MRA HEAD WITHOUT CONTRAST TECHNIQUE: Multiplanar and multiecho pulse sequences of the neck were obtained without and with intravenous contrast. Angiographic images of the neck were obtained using MRA technique without and with intravenous contast.; Angiographic images of the Circle of Willis were obtained using MRA technique without intravenous contrast. CONTRAST:  69mL MULTIHANCE GADOBENATE DIMEGLUMINE 529 MG/ML IV SOLN COMPARISON:  MRI head 05/06/2017 FINDINGS: MRA NECK FINDINGS Normal aortic arch. Antegrade flow in the carotid and vertebral arteries bilaterally. Atherosclerotic disease and mild stenosis at the origin of the internal carotid artery bilaterally. Moderate stenosis at the origin of the right vertebral artery. Origin of the left vertebral arteries not included on the study and not evaluated. Both vertebral arteries are patent to the basilar without additional stenosis. MRA HEAD FINDINGS Both vertebral arteries widely patent to the basilar. PICA patent bilaterally. Basilar widely patent. Moderate stenosis left superior cerebellar artery. Right superior cerebellar arteries patent. Mild stenosis right posterior  cerebral artery. Left posterior cerebral artery widely patent. Internal carotid artery widely patent bilaterally without stenosis. Anterior and middle cerebral arteries widely patent bilaterally without stenosis. Negative for cerebral aneurysm. IMPRESSION: Mild stenosis at the origin of the carotid artery bilaterally Moderate stenosis at the origin of the right vertebral artery. Origin of left vertebral not included on the study. Moderate stenosis left superior cerebellar artery. Mild stenosis right posterior cerebral artery. Basilar artery widely patent. Electronically Signed   By: Franchot Gallo M.D.  On: 05/06/2017 19:34   Mr Brain Wo Contrast  Result Date: 05/06/2017 CLINICAL DATA:  Focal neuro deficit, greater than 6 hours, stroke suspected. Left-sided weakness and facial droop. EXAM: MRI HEAD WITHOUT CONTRAST TECHNIQUE: Multiplanar, multiecho pulse sequences of the brain and surrounding structures were obtained without intravenous contrast. COMPARISON:  CT head without contrast from the same day. FINDINGS: Brain: The diffusion-weighted images demonstrate a right paramedian pontine infarct measuring 14 mm cephalo caudad. No acute supratentorial infarct is present. Advanced atrophy and confluent diffuse white matter disease is present otherwise. There is a remote lacunar infarct involving the right lentiform nucleus. Ischemic changes are present in the thalami bilaterally. Mild white matter changes extend into the brainstem otherwise. T2 signal changes associated with the acute/ subacute infarct. The cerebellum is normal. Vascular: Flow is present in the major intracranial arteries. Skull and upper cervical spine: The skullbase is within normal limits. Midline sagittal structures are unremarkable. The craniocervical junction is within normal limits. Sinuses/Orbits: Diffuse opacification is present in the ethmoid air cells bilaterally. Right maxillary antrostomy is noted. There is mild mucosal thickening  involving the maxillary sinuses bilaterally. Anterior sphenoid mucosal thickening is present. Mild mucosal thickening is present in the frontal sinuses bilaterally. There is some fluid in the mastoid air cells bilaterally. No obstructing nasopharyngeal lesion is present. IMPRESSION: 1. Acute/subacute nonhemorrhagic linear infarct involving the right paramedian pons, corresponding with the patient's left-sided weakness. 2. No acute supratentorial infarct. 3. Advanced atrophy and diffuse white matter disease likely reflects the sequela of chronic microvascular ischemia. 4. Moderate paranasal sinus disease, predominantly involving the ethmoids. Electronically Signed   By: San Morelle M.D.   On: 05/06/2017 12:34   [name] Head Wo Contrast  Result Date: 05/06/2017 CLINICAL DATA:  Stroke right pons EXAM: MRA NECK WITHOUT AND WITH CONTRAST MRA HEAD WITHOUT CONTRAST TECHNIQUE: Multiplanar and multiecho pulse sequences of the neck were obtained without and with intravenous contrast. Angiographic images of the neck were obtained using MRA technique without and with intravenous contast.; Angiographic images of the Circle of Willis were obtained using MRA technique without intravenous contrast. CONTRAST:  54mL MULTIHANCE GADOBENATE DIMEGLUMINE 529 MG/ML IV SOLN COMPARISON:  MRI head 05/06/2017 FINDINGS: MRA NECK FINDINGS Normal aortic arch. Antegrade flow in the carotid and vertebral arteries bilaterally. Atherosclerotic disease and mild stenosis at the origin of the internal carotid artery bilaterally. Moderate stenosis at the origin of the right vertebral artery. Origin of the left vertebral arteries not included on the study and not evaluated. Both vertebral arteries are patent to the basilar without additional stenosis. MRA HEAD FINDINGS Both vertebral arteries widely patent to the basilar. PICA patent bilaterally. Basilar widely patent. Moderate stenosis left superior cerebellar artery. Right superior  cerebellar arteries patent. Mild stenosis right posterior cerebral artery. Left posterior cerebral artery widely patent. Internal carotid artery widely patent bilaterally without stenosis. Anterior and middle cerebral arteries widely patent bilaterally without stenosis. Negative for cerebral aneurysm. IMPRESSION: Mild stenosis at the origin of the carotid artery bilaterally Moderate stenosis at the origin of the right vertebral artery. Origin of left vertebral not included on the study. Moderate stenosis left superior cerebellar artery. Mild stenosis right posterior cerebral artery. Basilar artery widely patent. Electronically Signed   By: Franchot Gallo M.D.   On: 05/06/2017 19:34   Ct Maxillofacial Wo Contrast  Result Date: 05/06/2017 CLINICAL DATA:  83 year old female with a history of fall and bruising on the forehead EXAM: CT HEAD WITHOUT CONTRAST CT MAXILLOFACIAL WITHOUT CONTRAST CT CERVICAL  SPINE WITHOUT CONTRAST TECHNIQUE: Multidetector CT imaging of the head, cervical spine, and maxillofacial structures were performed using the standard protocol without intravenous contrast. Multiplanar CT image reconstructions of the cervical spine and maxillofacial structures were also generated. COMPARISON:  04/30/2007 FINDINGS: CT HEAD FINDINGS Brain: No acute intracranial hemorrhage. No midline shift or mass effect. Similar appearance of mild brain volume loss. Unremarkable configuration the ventricles. Gray-white differentiation maintained. Confluent hypodensity in the periventricular white matter, similar prior. Vascular: Calcifications of anterior circulation. Skull: No skull fracture. Soft tissue swelling in the left supraorbital region. Other: Moderate opacification of ethmoid air cells with minimal sphenoid sinus mucoperiosteal thickening. CT MAXILLOFACIAL FINDINGS Osseous: No fracture of the mid face. No mandible fracture. Bilateral condyles appear located. Limited evaluation at the dentition secondary to  hardware. Orbits: Bilateral lens extraction Sinuses: Moderate opacification of ethmoid air cells. Trace mucoperiosteal thickening of the bilateral maxillary sinuses and the sphenoid sinuses. Soft tissues: Soft tissue swelling in the left supraorbital region. CT CERVICAL SPINE FINDINGS Alignment: No subluxation of the cervical elements. Trace anterolisthesis of C3 on C4 and C4 on C5. Facets maintain alignment. Craniocervical junction unremarkable with no skullbase fracture. Skull base and vertebrae: No skullbase fracture with the craniocervical junction aligned. No fracture of the vertebral bodies identified. Vertebral body heights maintained. Soft tissues and spinal canal: No canal hematoma identified. No soft tissue swelling or asymmetry of the cervical soft tissues. Dense calcifications of the bilateral carotid. Disc levels: Multilevel degenerative disc disease of the cervical spine. Greatest degree of disc space narrowing at C5-C6 with uncovertebral joint disease and small posterior disc osteophyte complex. More moderate disc space narrowing at C6-C7 with uncovertebral joint disease. Mild facet hypertrophy present throughout the cervical spine which is most pronounced on the right at C3-C4 and C4-C5. On the left there is ankylosis of the C2-C3 facet and hypertrophy at the C3-C4 and C4-C5 facet. The changes result in the greatest degree of foraminal narrowing on the left at C5-C6. No facet fracture. Upper chest: Upper chest not imaged Other: None IMPRESSION: Head CT: CT negative for acute intracranial abnormality. Evidence of chronic microvascular ischemic disease. Maxillofacial CT: No acute facial fracture. Soft tissue swelling in the left supraorbital scalp without underlying fracture. No radiopaque foreign body. Moderate paranasal sinus disease predominantly involving the ethmoid air cells. Cervical CT: CT negative for acute fracture malalignment of the cervical spine. Greatest degree of disc disease present at  C5-C6 with associated facet disease resulting in mild a moderate left-sided foraminal narrowing. Trace anterolisthesis of C3 on C4 and C4 on C5, likely degenerative. Carotid calcifications. Electronically Signed   By: Corrie Mckusick D.O.   On: 05/06/2017 10:30    Assessment/Plan: Diagnosis: Right pontine stroke Labs and images independently reviewed.  Records reviewed and summated above. Stroke: Continue secondary stroke prophylaxis and Risk Factor Modification listed below:   Antiplatelet therapy:   Blood Pressure Management:  Continue current medication with prn's with permisive HTN per primary team Statin Agent:   ?Left sided hemiparesis Motor recovery: Fluoxetine  1. Does the need for close, 24 hr/day medical supervision in concert with the patient's rehab needs make it unreasonable for this patient to be served in a less intensive setting? Yes  2. Co-Morbidities requiring supervision/potential complications: HTN (monitor and provide prns in accordance with increased physical exertion and pain), CKD (avoid nephrotoxic meds), OA (ensure pain does not limit therapies), B-12 deficiency (cont supplementation), Tachycardia (monitor in accordance with pain and increasing activity), ABLA (transfuse if necessary to ensure  appropriate perfusion for increased activity tolerance), left wrist pain (consider further workup), post-stroke dysphagia (advance diet as tolerated) 3. Due to safety, skin/wound care, disease management, pain management and patient education, does the patient require 24 hr/day rehab nursing? Yes 4. Does the patient require coordinated care of a physician, rehab nurse, PT (1-2 hrs/day, 5 days/week), OT (1-2 hrs/day, 5 days/week) and SLP (1-2 hrs/day, 5 days/week) to address physical and functional deficits in the context of the above medical diagnosis(es)? Yes Addressing deficits in the following areas: balance, endurance, locomotion, strength, transferring, bathing, dressing,  toileting, cognition, swallowing and psychosocial support 5. Can the patient actively participate in an intensive therapy program of at least 3 hrs of therapy per day at least 5 days per week? Potentially 6. The potential for patient to make measurable gains while on inpatient rehab is excellent 7. Anticipated functional outcomes upon discharge from inpatient rehab are min assist  with PT, min assist with OT, modified independent and supervision with SLP. 8. Estimated rehab length of stay to reach the above functional goals is: 13-18 days. 9. Anticipated D/C setting: Home 10. Anticipated post D/C treatments: HH therapy and Home excercise program 11. Overall Rehab/Functional Prognosis: good  RECOMMENDATIONS: This patient's condition is appropriate for continued rehabilitative care in the following setting: Will await PT eval as well as workup for left wrist pain.  Likely CIR. Patient has agreed to participate in recommended program. Yes Note that insurance prior authorization may be required for reimbursement for recommended care.  Comment: Rehab Admissions Coordinator to follow up.  Delice Lesch, MD, ABPMR Bary Leriche, [place] 05/07/2017

## 2017-05-07 NOTE — Progress Notes (Signed)
OT Cancellation Note  Patient Details Name: Theresa Hampton MRN: [id number] DOB: 02/03/1920   Cancelled Treatment:    Reason Eval/Treat Not Completed: Patient at procedure or test/ unavailable(echo)  [place], OT/L  [phone] 05/07/2017 05/07/2017, 11:38 AM

## 2017-05-07 NOTE — Progress Notes (Signed)
I met with pt at bedside and then contacted her daughter, Ann, by phone. We discussed goals and expectations of an inpt rehab admission. Both agree to admit. I am hopeful for a bed to admit pt to on Wednesday 05/09/2017. [phone] 

## 2017-05-07 NOTE — Progress Notes (Signed)
  Echocardiogram 2D Echocardiogram has been performed.  Orissa Arreaga T Raniya Golembeski 05/07/2017, 11:48 AM

## 2017-05-07 NOTE — Plan of Care (Signed)
  Safety: Ability to remain free from injury will improve 05/07/2017 0146 - Progressing by Irish Lack, RN   Education: Knowledge of General Education information will improve 05/07/2017 0146 - Progressing by Irish Lack, RN   Education: Knowledge of disease or condition will improve 05/07/2017 0146 - Progressing by Irish Lack, RN

## 2017-05-07 NOTE — Progress Notes (Signed)
NEUROHOSPITALISTS STROKE TEAM - DAILY PROGRESS NOTE   ADMISSION HISTORY: Theresa Hampton is a 83 y.o. left-handed female who has a past medical history of hypertension, hyperlipidemia, peptic ulcer disease, vitamin D and B12 deficiency, documented chronic kidney disease on the chart, who was in her usual sort of health to the best of family's knowledge about 2 days ago when she started having some difficulty walking and falls.  She had one fall yesterday.  This morning she was seen by her daughter, who lives nearby.  The daughter noticed that the patient had to be angle of the mouth on the left side.  She was brought into the emergency room for evaluation of concern for stroke. On the initial ER evaluation, she did have the left facial weakness but was grossly otherwise nonfocal. Noncontrast CT of the head did not reveal any acute abnormality. Patient was sent in for an MRI of the brain and a neurology consult was obtained.  LKW: 7 AM on 05/04/2017 tpa given?: no, outside the window Premorbid modified Rankin scale (mRS): 3 NIHSS: 3  Dysarthria, facial palsy, Left leg weakness  SUBJECTIVE (INTERVAL HISTORY) Family is at the bedside. Patient is found laying in bed in NAD. Overall she feels her condition is unchanged. Voices no new complaints.  Brief episode of fluctuating dysarthria noted by nurse overnight.  SLP reevaluated patient this morning and patient placed on dysphagia 2 diet   OBJECTIVE Lab Results: CBC:  Recent Labs  Lab 05/06/17 0857 05/06/17 0916 05/07/17 0330  WBC 9.1  --  8.2  HGB 12.2 12.2 11.3*  HCT 36.7 36.0 34.8*  MCV 87.8  --  88.3  PLT 257  --  231   BMP: Recent Labs  Lab 05/06/17 0857 05/06/17 0916 05/07/17 0330  NA 140 143 140  K 3.5 3.6 3.5  CL 104 106 108  CO2 25  --  22  GLUCOSE 111* 108* 87  BUN 10 10 9   CREATININE 0.79 0.60 0.78  CALCIUM 9.2  --  8.5*   Liver Function Tests:  Recent Labs    Lab 05/06/17 0857 05/07/17 0330  AST 19 15  ALT 14 11*  ALKPHOS 80 71  BILITOT 0.9 0.9  PROT 7.5 6.3*  ALBUMIN 3.5 3.0*   Cardiac Enzymes:  Recent Labs  Lab 05/06/17 1616  TROPONINI <0.03   Coagulation Studies:  Recent Labs    05/06/17 0857  APTT 30  INR 1.01   PHYSICAL EXAM Temp:  [98.5 F (36.9 C)-99.1 F (37.3 C)] 99.1 F (37.3 C) (12/31 0543) Pulse Rate:  [88-102] 102 (12/31 0543) Resp:  [13-21] 18 (12/31 0543) BP: (122-184)/(58-113) 143/74 (12/31 0543) SpO2:  [94 %-98 %] 94 % (12/31 0543) Weight:  [56.5 kg (124 lb 9 oz)-57.4 kg (126 lb 8.7 oz)] 57.4 kg (126 lb 8.7 oz) (12/31 0435) General - Well nourished, well developed, in no apparent distress HEENT-  Bilateral occular ecchymosis, worse on Left. Normocephalic, Normal external eye/conjunctiva.  Normal external ears. Normal external nose, mucus membranes and septum.   Cardiovascular - Regular rate and rhythm  Respiratory - Lungs clear bilaterally. No wheezing. Abdomen - soft and non-tender, BS normal Extremities- no edema or cyanosis Neurological exam Awake alert oriented x3 Speech is mildly dysarthric Naming, repetition and calm prevention intact. Cranial nerves: Pupils equal round reactive to light, extraocular movements intact, visual fields full, facial examination shows left lower face weakness.  Auditory acuity reduced to conversational hearing bilaterally, palate elevates symmetrically, tongue midline. Motor  exam: 5/5 right upper and right lower extremity.  4+/5 left upper extremity, 4+/5 left lower extremity.  Mild vertical drift in the left arm and leg.  No vertical drift in other extremities. Sensory exam: Intact to light touch all over. Coordination: Intact finger-nose-finger bilaterally Gait testing was deferred at this time.  IMAGING: I have personally reviewed the radiological images below and agree with the radiology interpretations.  Dg Sacrum/coccyx Result Date: 05/06/2017 IMPRESSION:  Distal sacral fracture of uncertain age.   Mr Brain Wo Contrast Result Date: 05/06/2017 IMPRESSION: 1. Acute/subacute nonhemorrhagic linear infarct involving the right paramedian pons, corresponding with the patient's left-sided weakness. 2. No acute supratentorial infarct. 3. Advanced atrophy and diffuse white matter disease likely reflects the sequela of chronic microvascular ischemia. 4. Moderate paranasal sinus disease, predominantly involving the ethmoids.   Mr [facility] Wo Contrast Result Date: 05/06/2017 IMPRESSION: Mild stenosis at the origin of the carotid artery bilaterally Moderate stenosis at the origin of the right vertebral artery. Origin of left vertebral not included on the study. Moderate stenosis left superior cerebellar artery. Mild stenosis right posterior cerebral artery. Basilar artery widely patent.   Ct Head, Maxillofacial and Cervical Wo Contrast Result Date: 05/06/2017 IMPRESSION:  Head CT: CT negative for acute intracranial abnormality. Evidence of chronic microvascular ischemic disease.  Maxillofacial CT: No acute facial fracture. Soft tissue swelling in the left supraorbital scalp without underlying fracture. No radiopaque foreign body. Moderate paranasal sinus disease predominantly involving the ethmoid air cells.  Cervical CT: CT negative for acute fracture malalignment of the cervical spine. Greatest degree of disc disease present at C5-C6 with associated facet disease resulting in mild a moderate left-sided foraminal narrowing. Trace anterolisthesis of C3 on C4 and C4 on C5, likely degenerative. Carotid calcifications.   Echocardiogram:                                               Study Conclusions - Left ventricle: The cavity size was normal. There was mild   concentric hypertrophy. Systolic function was normal. The   estimated ejection fraction was in the range of 55% to 60%. Wall   motion was normal; there were no regional wall motion    abnormalities. Doppler parameters are consistent with abnormal   left ventricular relaxation (grade 1 diastolic dysfunction).   There was no evidence of elevated ventricular filling pressure by   Doppler parameters. - Aortic valve: Trileaflet; moderately thickened, severely   calcified leaflets. There was mild stenosis. Peak gradient (S):   12 mm Hg. Valve area (Vmax): 1.16 cm^2. - Aortic root: The aortic root was normal in size. - Mitral valve: There was mild regurgitation. - Right ventricle: The cavity size was normal. Wall thickness was   normal. Systolic function was normal. - Tricuspid valve: There was moderate regurgitation. - Pulmonic valve: There was trivial regurgitation. - Pulmonary arteries: Systolic pressure was moderately increased.   PA peak pressure: 43 mm Hg (S). - Inferior vena cava: The vessel was normal in size. - Pericardium, extracardiac: There was no pericardial effusion.    IMPRESSION: Ms. ADDYSON TRAUB is a 83 y.o. female with PMH of hypertension hyperlipidemia peptic ulcer disease vitamin D and B12 deficiency with 2 days worth of left facial droop and falls. Examination she has left facial droop, mild dysarthria and mild left hemiparesis. MRI exam shows a right  paramedian pons area of restricted diffusion.  Acute/subacute nonhemorrhagic linear infarct Right paramedian pons  Suspected Etiology: Likely small vessel Resultant Symptoms: left facial droop, mild dysarthria and Left hemiparesis. Stroke Risk Factors: hyperlipidemia and hypertension Other Stroke Risk Factors: Advanced age  Outstanding Stroke Work-up Studies:    Workup completed  05/07/2017 ASSESSMENT:   Neuro exam remained stable with left facial droop, mild dysarthria and left hemiparesis.  All image and laboratory testing reviewed with patient and daughters at bedside.  Daughters are extremely interested in having patient evaluated by CIR so that she may get back to her baseline as soon as  possible.  PLAN  05/07/2017: Continue Plavix, ASA discontinued Continue Statin, new medication for patient Frequent neuro checks Telemetry monitoring PT/OT/SLP Consult PM & Rehab Ongoing aggressive stroke risk factor management Patient counseled to be compliant with her antithrombotic medications Patient counseled on Lifestyle modifications including, Diet, Exercise, and Stress Follow up with [facility] in 6 weeks  DYSPHAGIA: Passed SLP swallow evaluation - Dysphagia 2 Aspiration Precautions in progress  Post Menopausal Discontinue Estradial  HYPERTENSION: Stable Permissive hypertension (OK if <220/120) for 24-48 hours post stroke and then gradually normalized within 5-7 days. Long term BP goal normotensive. May slowly restart home B/P medications after 48 hours Home Meds: Norvasc  HYPERLIPIDEMIA:    Component Value Date/Time   CHOL 217 (H) 05/07/2017 0330   TRIG 94 05/07/2017 0330   HDL 65 05/07/2017 0330   CHOLHDL 3.3 05/07/2017 0330   VLDL 19 05/07/2017 0330   LDLCALC 133 (H) 05/07/2017 0330  Home Meds:  NONE LDL  goal < 70 Started on Lipitor to 40 mg daily Continue statin at discharge  R/O DIABETES: Lab Results  Component Value Date   HGBA1C 5.3 05/07/2017  HgbA1c goal < 7.0  Other Active Problems: Active Problems:   B12 deficiency   Vitamin D deficiency   Hyperlipidemia   Sinusitis, chronic   Generalized anxiety disorder   Menopausal disorder   HTN (hypertension)   OA (osteoarthritis) of hip   Facial droop   Stroke (cerebrum) [facility])    Hospital day # 1 VTE prophylaxis:  Heparin  Diet : DIET DYS 2 Room service appropriate? Yes; Fluid consistency: Thin   FAMILY UPDATES: family at bedside  TEAM UPDATES: Cristal Ford, DO   Prior Home Stroke Medications:  aspirin 81 mg daily  Discharge Stroke Meds:  Please discharge patient on clopidogrel 75 mg daily and Lipitor 40 mg   Disposition: 06-Home-Health Care Svc CIR evaluation  pending Therapy Recs:               PENDING Home Equipment:         PENDING Follow Up:  Follow-up Information    Plotnikov, Evie Lacks, MD Follow up.   Specialty:  Internal Medicine Contact information: [place] [address] [phone]        Dennie Bible, NP. Schedule an appointment as soon as possible for a visit in 6 week(s).   Specialty:  Family Medicine Contact information: [address] [facility] [address] [place] discussed with with attending physician and they are in agreement.    Renie Ora Stroke Neurology Team 05/07/2017 1:27 PM  Attending Note: I reviewed above note and agree with the assessment and plan. I have made any additions or clarifications directly to the above note. Pt was seen and examined.  83 year old female with history of CKD, HTN, HLD, PAD, B12 deficiency admitted for left-sided weakness and left facial droop.  CT no acute abnormality, MRI showed right pontine infarct.  MRA head and neck showed diffuse atherosclerosis including right PCA, left ICA, bilateral VA origin stenosis.  EF 55-60%.  LDL 133, A1c 5.3 and UDS negative.  Patient stroke most likely due to small vessel disease based on location and stroke risk factors.  She is on baby aspirin PTA, changed to Plavix for stroke prevention, and also added Lipitor.  She passes swallow on pured diet.  PT OT recommended CIR.  Neurology will sign off. Please call with questions. Pt will follow up with Cecille Rubin, NP, at [facility] in about 6 weeks. Thanks for the consult.  Rosalin Hawking, MD PhD Stroke Neurology 05/07/2017 3:04 PM    Neurology to sign-off at this time. Please call with any further questions or concerns. Thank you for this consultation.  To contact Stroke Continuity provider, please refer to [email or web address] After hours, contact General Neurology

## 2017-05-07 NOTE — Telephone Encounter (Signed)
Copied from [place] [phone]. Topic: Quick Communication - See Telephone Encounter >> May 07, 2017  2:23 PM Theresa Hampton wrote: CRM for notification. See Telephone encounter for:  Theresa Hampton - [facility] [phone]  Patient has had a mild stroke and will be in hospital for a few weeks.  05/07/17.

## 2017-05-07 NOTE — Consult Note (Addendum)
[facility] CM Primary Care Navigator  05/07/2017  Theresa Hampton 01-23-1920 [id number]   Met with patientand home nurse Judson Roch) at the bedside to identify possible discharge needs.  Patient's home nurse reports that unwitnessed falls and facial drooping had resulted to this admission.   Patient endorsesDr. Tyrone Apple Plotnikov with [employer] at [place] as her primary care provider.    Patient's nurse states using [place] on [address] obtain medications without any problem.  Her home nurse mentioned that she is managing patient's medications at home using "pill box" system filled once a month and patient goes by a "list" system.    Patient's transportation is being provided for by her daughters Lelon Frohlich and Zigmund Daniel) or her home nurse in going to her doctors'appointments.   Patientstates that her daughters (live close by) and home nurse serve as her primary caregivers at home. Patient's nurse usually comes in 1- 4 hours, 2- 3 times a week as stated.  [facility] by therapists prior to returning home.   Patient and home nurse voiced understanding to call primary care provider's office when she returns back home, for a post discharge follow-up within1-2weeksor sooner if needs arise. Patient letter (with PCP's contact number) was provided astheirreminder.   Discussed with patient regarding [facility] CM services available for health management at home but she denies any needs or concerns at this time. Patient and home nurseexpressed understandingto seek referral from primary care provider to [facility] care management if deemed necessary and appropriate for services in the future.   [facility] care management information was provided for future needs that she may have.  Noted order for EMMI Stroke calls in place for patient.   For additional questions please contact:  Edwena Felty A. Baylee Mccorkel, BSN, RN-BC [facility]  PRIMARY CARE Navigator Cell: [phone]

## 2017-05-07 NOTE — Progress Notes (Signed)
Occupational Therapy Evaluation Patient Details Name: Theresa Hampton MRN: [id number] DOB: 1920-03-23 Today's Date: 05/07/2017    History of Present Illness 83 y.o. female with history of HTN, CKD, OA, B-12 deficiency, one week history of difficulty walking progressing to increased falls. She was admitted on 05/06/17 am after found by family with left  facial droop. MRI/MRA brain done revealing  Right pontine stroke. L THA anterior 2016.    Clinical Impression   PTA, pt lived alone and was modified independent with ADL and mobility. Pt had a caregiver 3 days/wk and family assisted as needed. Pt demonstrated a significant functional decline due to deficits listed below and will benefit from intensive rehab at CIR to maximize functional level of independence to facilitate DC home with 24/7 A of family. Will follow acutely to facilitate safe DC to next venue of care. Family very supportive.     Follow Up Recommendations  CIR;Supervision/Assistance - 24 hour    Equipment Recommendations  Other (comment)(TBA)    Recommendations for Other Services Rehab consult     Precautions / Restrictions Precautions Precautions: Fall Precaution Comments: L painful wrist Restrictions Other Position/Activity Restrictions: maintained NWB rhrough L hand due to pain      Mobility Bed Mobility Overal bed mobility: Needs Assistance Bed Mobility: Supine to Sit     Supine to sit: Max assist     General bed mobility comments: Pt able to initiate movement; limited by pain  Transfers Overall transfer level: Needs assistance   Transfers: Sit to/from Stand;Stand Pivot Transfers Sit to Stand: Max assist Stand pivot transfers: +2 safety/equipment;Max assist       General transfer comment: unable to achieve upright posture; fear of falling; complaining of back pain    Balance                                           ADL either performed or assessed with clinical judgement    ADL Overall ADL's : Needs assistance/impaired Eating/Feeding: Minimal assistance;Sitting(modified diet)   Grooming: Moderate assistance;Sitting   Upper Body Bathing: Sitting;Moderate assistance   Lower Body Bathing: Maximal assistance;Sit to/from stand   Upper Body Dressing : Maximal assistance;Sitting   Lower Body Dressing: Maximal assistance;Sit to/from stand   Toilet Transfer: Maximal assistance;Stand-pivot   Toileting- Clothing Manipulation and Hygiene: Maximal assistance       Functional mobility during ADLs: Maximal assistance       Vision Baseline Vision/History: Wears glasses Wears Glasses: Reading only Additional Comments: decreased visual attention. will further assess     Perception     Praxis      Pertinent Vitals/Pain Pain Assessment: 0-10 Pain Score: 10-Worst pain ever Pain Location: L wrist/CMC joint Pain Descriptors / Indicators: Aching;Burning;Constant Pain Intervention(s): Limited activity within patient's tolerance;Repositioned;Ice applied;RN gave pain meds during session(MD notified)     Hand Dominance Right   Extremity/Trunk Assessment Upper Extremity Assessment Upper Extremity Assessment: LUE deficits/detail LUE Deficits / Details: generalized weakness; gross grasp/release/ por in hand manipulation skills; attempting to use hand but limited by pain; isolated movements noted. will further assess LUE Coordination: decreased fine motor;decreased gross motor(difficulty using functionally at this time)   Lower Extremity Assessment Lower Extremity Assessment: Defer to PT evaluation;LLE deficits/detail LLE Deficits / Details: LLE generalized weakness   Cervical / Trunk Assessment Cervical / Trunk Assessment: Kyphotic   Communication Communication Communication: Expressive difficulties   Cognition Arousal/Alertness:  Awake/alert Behavior During Therapy: Anxious Overall Cognitive Status: Impaired/Different from baseline Area of Impairment:  Attention;Safety/judgement;Awareness;Problem solving                   Current Attention Level: Sustained     Safety/Judgement: Decreased awareness of safety Awareness: Emergent Problem Solving: Slow processing;Requires verbal cues;Requires tactile cues     General Comments       Exercises     Shoulder Instructions      Home Living Family/patient expects to be discharged to:: Inpatient rehab                                    Lives With: Alone(has caregiver and family who assist as needed)    Prior Functioning/Environment Level of Independence: Independent        Comments: Pt ambulated without an AD unless she was fatigued; independently completed ADL tasks; baked a pound cake 10 days ago        OT Problem List: Decreased strength;Decreased range of motion;Decreased activity tolerance;Impaired balance (sitting and/or standing);Impaired vision/perception;Decreased coordination;Decreased cognition;Decreased safety awareness;Decreased knowledge of use of DME or AE;Decreased knowledge of precautions;Impaired tone;Impaired UE functional use;Pain      OT Treatment/Interventions: Self-care/ADL training;Therapeutic exercise;Neuromuscular education;DME and/or AE instruction;Therapeutic activities;Cognitive remediation/compensation;Visual/perceptual remediation/compensation;Patient/family education;Balance training    OT Goals(Current goals can be found in the care plan section) Acute Rehab OT Goals Patient Stated Goal: to be independent again OT Goal Formulation: With patient/family Time For Goal Achievement: 05/22/16 Potential to Achieve Goals: Good  OT Frequency: Min 2X/week   Barriers to D/C:            Co-evaluation              AM-PAC PT "6 Clicks" Daily Activity     Outcome Measure Help from another person eating meals?: A Little Help from another person taking care of personal grooming?: A Lot Help from another person toileting, which  includes using toliet, bedpan, or urinal?: A Lot Help from another person bathing (including washing, rinsing, drying)?: A Lot Help from another person to put on and taking off regular upper body clothing?: A Lot Help from another person to put on and taking off regular lower body clothing?: A Lot 6 Click Score: 13   End of Session Equipment Utilized During Treatment: Gait belt Nurse Communication: Mobility status;Patient requests pain meds;Other (comment)(L wrist pain)  Activity Tolerance: Patient tolerated treatment well Patient left: in chair;with call bell/phone within reach;with family/visitor present  OT Visit Diagnosis: Other abnormalities of gait and mobility (R26.89);History of falling (Z91.81);Muscle weakness (generalized) (M62.81);Other symptoms and signs involving cognitive function;Pain Pain - Right/Left: Left Pain - part of body: Hand                Time: 0539-7673 OT Time Calculation (min): 38 min Charges:  OT General Charges $OT Visit: 1 Visit OT Evaluation $OT Eval Moderate Complexity: 1 Mod OT Treatments $Self Care/Home Management : 23-37 mins G-Codes:     [facility], OT/L  [phone] 05/07/2017  Madalyn Legner,HILLARY 05/07/2017, 1:28 PM

## 2017-05-08 ENCOUNTER — Other Ambulatory Visit: Payer: Self-pay

## 2017-05-08 ENCOUNTER — Encounter (HOSPITAL_COMMUNITY): Payer: Self-pay | Admitting: General Practice

## 2017-05-08 MED ORDER — HYDROCODONE-ACETAMINOPHEN 7.5-325 MG PO TABS
1.0000 | ORAL_TABLET | ORAL | Status: DC | PRN
  Administered 2017-05-08 – 2017-05-09 (×4): 1 via ORAL
  Filled 2017-05-08 (×3): qty 1

## 2017-05-08 MED ORDER — DICLOFENAC SODIUM 1 % TD GEL
2.0000 g | Freq: Four times a day (QID) | TRANSDERMAL | Status: DC
  Administered 2017-05-08 (×3): 2 g via TOPICAL
  Filled 2017-05-08: qty 100

## 2017-05-08 MED ORDER — POTASSIUM CHLORIDE CRYS ER 20 MEQ PO TBCR
40.0000 meq | EXTENDED_RELEASE_TABLET | Freq: Once | ORAL | Status: AC
  Administered 2017-05-08: 40 meq via ORAL
  Filled 2017-05-08: qty 2

## 2017-05-08 NOTE — Progress Notes (Signed)
PROGRESS NOTE    Theresa Hampton  DUK:[id number] DOB: 1919/08/02 DOA: 05/06/2017 PCP: Cassandria Anger, MD   Chief Complaint  Patient presents with  . Weakness    Brief Narrative:  84 y.o. female with history of hypertension, hyperlipidemia, history of PUD, vitamin D and B12 deficiency, osteoporosis, hard of hearing who was in his usual state of health until about 1 week ago, when she began having some difficulty walking, with increasing falls 2 days prior to admission.  Admitted for CVA.   Assessment & Plan   Acute/subacute CVA -presented with recurrent falls, left facial droop -CT head: negative for acute intracranial abnormality -MRI brain: Subacute nonhemorrhagic linear infarct involving the right paramedian pons. -MRA head/neck: Mild stenosis at the origin of the carotid artery bilaterally. Moderate stenosis at the origin of right vertebral artery. Moderate stenosis left superior cerebellar artery. -LDL 133, hemoglobin A1c 5.3 -Echocardiogram EF55-60%, G1DD -Continue Plavix, statin (aspirin discontinued) -PT, OT recommended CIR -Speech consulted, recommending inpatient rehabilitation -Neurology consulted and appreciated, continue statin, plavix, with follow up in 6 weeks -CIR consulted and pending approval  Essential hypertension -Allow for permissive hypertension given CVA -Currently on amlodipine  Hyperlipidemia -Continue statin  Anxiety -Continue Remeron, Klonopin as needed  Osteoporosis/Vitamin D deficiency  -continue supplementation  B12 deficiency -Continue supplementation   Sacral fracture -Likely secondary to fall -X-ray showed distal sacral fracture, uncertain age -As above, PT consulted -Continue pain control  Left hand pain -likely secondary to fall -left hand xray: showed wrist swelling, no fracture or dislocation  -Continue pain control  DVT Prophylaxis  heparin  Code Status: Full  Family Communication: None at bedside  Disposition  Plan: admitted, pending CVA workup. dispo pending- CIR?  Consultants Neurology Inpatient rehab  Procedures  Echocardiogram  Antibiotics   Anti-infectives (From admission, onward)   None      Subjective:   Theresa Hampton seen and examined today. Complains of left hand pain and soreness. Denies chest pain, shortness of breath, abdominal pain, N/V/D/C.   Objective:   Vitals:   05/08/17 0200 05/08/17 0500 05/08/17 0655 05/08/17 0948  BP: (!) 148/69  (!) 125/55 118/61  Pulse:   95 89  Resp:   18 19  Temp: 98.5 F (36.9 C)  98.7 F (37.1 C) (!) 97.5 F (36.4 C)  TempSrc: Oral  Oral Oral  SpO2: 94%  94% 94%  Weight:  57.2 kg (126 lb 1.7 oz)    Height:        Intake/Output Summary (Last 24 hours) at 05/08/2017 1210 Last data filed at 05/08/2017 3762 Gross per 24 hour  Intake 890 ml  Output 1000 ml  Net -110 ml   Filed Weights   05/06/17 2031 05/07/17 0435 05/08/17 0500  Weight: 56.5 kg (124 lb 9 oz) 57.4 kg (126 lb 8.7 oz) 57.2 kg (126 lb 1.7 oz)    Exam  General: Well developed, elderly, NAD  HEENT: [place], periorbital bruising, mucous membranes moist.   Cardiovascular: S1 S2 auscultated, RRR, no rubs  Respiratory: Clear to auscultation bilaterally with equal chest rise, no wheezing or rhonchi  Abdomen: Soft, nontender, nondistended, + bowel sounds  Extremities: warm dry without cyanosis clubbing or edema. Left hand edema  Neuro: AAOx3, left sided facial droop, mildly dysathric, mild left sided weakness  Psych: Appropriate mood and affect   Data Reviewed: I have personally reviewed following labs and imaging studies  CBC: Recent Labs  Lab 05/06/17 0857 05/06/17 0916 05/07/17 0330  WBC 9.1  --  8.2  NEUTROABS 7.1  --   --   HGB 12.2 12.2 11.3*  HCT 36.7 36.0 34.8*  MCV 87.8  --  88.3  PLT 257  --  263   Basic Metabolic Panel: Recent Labs  Lab 05/06/17 0857 05/06/17 0916 05/07/17 0330  NA 140 143 140  K 3.5 3.6 3.5  CL 104 106 108  CO2 25  --   22  GLUCOSE 111* 108* 87  BUN 10 10 9   CREATININE 0.79 0.60 0.78  CALCIUM 9.2  --  8.5*   GFR: Estimated Creatinine Clearance: 37.9 mL/min (by C-G formula based on SCr of 0.78 mg/dL). Liver Function Tests: Recent Labs  Lab 05/06/17 0857 05/07/17 0330  AST 19 15  ALT 14 11*  ALKPHOS 80 71  BILITOT 0.9 0.9  PROT 7.5 6.3*  ALBUMIN 3.5 3.0*   No results for input(s): LIPASE, AMYLASE in the last 168 hours. No results for input(s): AMMONIA in the last 168 hours. Coagulation Profile: Recent Labs  Lab 05/06/17 0857  INR 1.01   Cardiac Enzymes: Recent Labs  Lab 05/06/17 1616  TROPONINI <0.03   BNP (last 3 results) No results for input(s): PROBNP in the last 8760 hours. HbA1C: Recent Labs    05/07/17 0330  HGBA1C 5.3   CBG: No results for input(s): GLUCAP in the last 168 hours. Lipid Profile: Recent Labs    05/07/17 0330  CHOL 217*  HDL 65  LDLCALC 133*  TRIG 94  CHOLHDL 3.3   Thyroid Function Tests: No results for input(s): TSH, T4TOTAL, FREET4, T3FREE, THYROIDAB in the last 72 hours. Anemia Panel: No results for input(s): VITAMINB12, FOLATE, FERRITIN, TIBC, IRON, RETICCTPCT in the last 72 hours. Urine analysis:    Component Value Date/Time   COLORURINE STRAW (A) 05/06/2017 1056   APPEARANCEUR CLEAR 05/06/2017 1056   LABSPEC 1.006 05/06/2017 1056   PHURINE 5.0 05/06/2017 1056   GLUCOSEU NEGATIVE 05/06/2017 1056   GLUCOSEU NEGATIVE 08/01/2016 1530   HGBUR NEGATIVE 05/06/2017 [place] 05/06/2017 [place] 05/06/2017 1056   PROTEINUR NEGATIVE 05/06/2017 1056   UROBILINOGEN 0.2 08/01/2016 1530   NITRITE NEGATIVE 05/06/2017 1056   LEUKOCYTESUR NEGATIVE 05/06/2017 1056   Sepsis Labs: @LABRCNTIP (procalcitonin:4,lacticidven:4)  )No results found for this or any previous visit (from the past 240 hour(s)).    Radiology Studies: Dg Sacrum/coccyx  Result Date: 05/06/2017 CLINICAL DATA:  Buttock pain EXAM: SACRUM AND  COCCYX - 2+ VIEW COMPARISON:  None. FINDINGS: Pelvic ring is intact. Bilateral hip replacement is noted. Irregularity is noted in the distal sacrum consistent with prior fracture. This is of indeterminate age. No other focal abnormality is noted. Marland Kitchen IMPRESSION: Distal sacral fracture of uncertain age. Electronically Signed   By: Inez Catalina M.D.   On: 05/06/2017 12:43   [name] Neck W Wo Contrast  Result Date: 05/06/2017 CLINICAL DATA:  Stroke right pons EXAM: MRA NECK WITHOUT AND WITH CONTRAST MRA HEAD WITHOUT CONTRAST TECHNIQUE: Multiplanar and multiecho pulse sequences of the neck were obtained without and with intravenous contrast. Angiographic images of the neck were obtained using MRA technique without and with intravenous contast.; Angiographic images of the Circle of Willis were obtained using MRA technique without intravenous contrast. CONTRAST:  91mL MULTIHANCE GADOBENATE DIMEGLUMINE 529 MG/ML IV SOLN COMPARISON:  MRI head 05/06/2017 FINDINGS: MRA NECK FINDINGS Normal aortic arch. Antegrade flow in the carotid and vertebral arteries bilaterally. Atherosclerotic disease and mild stenosis at the origin of the internal carotid artery  bilaterally. Moderate stenosis at the origin of the right vertebral artery. Origin of the left vertebral arteries not included on the study and not evaluated. Both vertebral arteries are patent to the basilar without additional stenosis. MRA HEAD FINDINGS Both vertebral arteries widely patent to the basilar. PICA patent bilaterally. Basilar widely patent. Moderate stenosis left superior cerebellar artery. Right superior cerebellar arteries patent. Mild stenosis right posterior cerebral artery. Left posterior cerebral artery widely patent. Internal carotid artery widely patent bilaterally without stenosis. Anterior and middle cerebral arteries widely patent bilaterally without stenosis. Negative for cerebral aneurysm. IMPRESSION: Mild stenosis at the origin of the carotid  artery bilaterally Moderate stenosis at the origin of the right vertebral artery. Origin of left vertebral not included on the study. Moderate stenosis left superior cerebellar artery. Mild stenosis right posterior cerebral artery. Basilar artery widely patent. Electronically Signed   By: Franchot Gallo M.D.   On: 05/06/2017 19:34   Mr Brain Wo Contrast  Result Date: 05/06/2017 CLINICAL DATA:  Focal neuro deficit, greater than 6 hours, stroke suspected. Left-sided weakness and facial droop. EXAM: MRI HEAD WITHOUT CONTRAST TECHNIQUE: Multiplanar, multiecho pulse sequences of the brain and surrounding structures were obtained without intravenous contrast. COMPARISON:  CT head without contrast from the same day. FINDINGS: Brain: The diffusion-weighted images demonstrate a right paramedian pontine infarct measuring 14 mm cephalo caudad. No acute supratentorial infarct is present. Advanced atrophy and confluent diffuse white matter disease is present otherwise. There is a remote lacunar infarct involving the right lentiform nucleus. Ischemic changes are present in the thalami bilaterally. Mild white matter changes extend into the brainstem otherwise. T2 signal changes associated with the acute/ subacute infarct. The cerebellum is normal. Vascular: Flow is present in the major intracranial arteries. Skull and upper cervical spine: The skullbase is within normal limits. Midline sagittal structures are unremarkable. The craniocervical junction is within normal limits. Sinuses/Orbits: Diffuse opacification is present in the ethmoid air cells bilaterally. Right maxillary antrostomy is noted. There is mild mucosal thickening involving the maxillary sinuses bilaterally. Anterior sphenoid mucosal thickening is present. Mild mucosal thickening is present in the frontal sinuses bilaterally. There is some fluid in the mastoid air cells bilaterally. No obstructing nasopharyngeal lesion is present. IMPRESSION: 1. Acute/subacute  nonhemorrhagic linear infarct involving the right paramedian pons, corresponding with the patient's left-sided weakness. 2. No acute supratentorial infarct. 3. Advanced atrophy and diffuse white matter disease likely reflects the sequela of chronic microvascular ischemia. 4. Moderate paranasal sinus disease, predominantly involving the ethmoids. Electronically Signed   By: San Morelle M.D.   On: 05/06/2017 12:34   Dg Hand Complete Left  Result Date: 05/07/2017 CLINICAL DATA:  Pain in the first metacarpal and thumb. No known injury. EXAM: LEFT HAND - COMPLETE 3+ VIEW COMPARISON:  None. FINDINGS: The bones are demineralized. There is no evidence of acute fracture or dislocation. There are moderate degenerative changes throughout the interphalangeal joints, greatest at third PIP joint. There are moderate scaphotrapeziotrapezoidal degenerative changes. No erosive changes are identified. There is focal prominence of the soft tissues in the radial aspect of wrist. IMPRESSION: 1. Radial wrist soft tissue swelling/prominence. In the absence of trauma, this could reflect a ganglion or deQuervain's tenosynovitis. 2. 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 Electronically Signed   By: Richardean Sale M.D.   On: 05/07/2017 14:40   [name] Head Wo Contrast  Result Date: 05/06/2017 CLINICAL DATA:  Stroke right pons EXAM: MRA NECK WITHOUT AND WITH CONTRAST MRA HEAD WITHOUT CONTRAST TECHNIQUE:  Multiplanar and multiecho pulse sequences of the neck were obtained without and with intravenous contrast. Angiographic images of the neck were obtained using MRA technique without and with intravenous contast.; Angiographic images of the Circle of Willis were obtained using MRA technique without intravenous contrast. CONTRAST:  95mL MULTIHANCE GADOBENATE DIMEGLUMINE 529 MG/ML IV SOLN COMPARISON:  MRI head 05/06/2017 FINDINGS: MRA NECK FINDINGS Normal aortic arch. Antegrade flow  in the carotid and vertebral arteries bilaterally. Atherosclerotic disease and mild stenosis at the origin of the internal carotid artery bilaterally. Moderate stenosis at the origin of the right vertebral artery. Origin of the left vertebral arteries not included on the study and not evaluated. Both vertebral arteries are patent to the basilar without additional stenosis. MRA HEAD FINDINGS Both vertebral arteries widely patent to the basilar. PICA patent bilaterally. Basilar widely patent. Moderate stenosis left superior cerebellar artery. Right superior cerebellar arteries patent. Mild stenosis right posterior cerebral artery. Left posterior cerebral artery widely patent. Internal carotid artery widely patent bilaterally without stenosis. Anterior and middle cerebral arteries widely patent bilaterally without stenosis. Negative for cerebral aneurysm. IMPRESSION: Mild stenosis at the origin of the carotid artery bilaterally Moderate stenosis at the origin of the right vertebral artery. Origin of left vertebral not included on the study. Moderate stenosis left superior cerebellar artery. Mild stenosis right posterior cerebral artery. Basilar artery widely patent. Electronically Signed   By: Franchot Gallo M.D.   On: 05/06/2017 19:34     Scheduled Meds: . amLODipine  2.5 mg Oral Daily  . atorvastatin  40 mg Oral q1800  . cholecalciferol  2,000 Units Oral QHS  . clopidogrel  75 mg Oral Daily  . cyanocobalamin  500 mcg Oral Daily  . gabapentin  100 mg Oral QHS  . heparin  5,000 Units Subcutaneous Q8H  . mirtazapine  15 mg Oral QHS   Continuous Infusions: . sodium chloride 75 mL/hr at 05/06/17 2319     LOS: 2 days   Time Spent in minutes   45 minutes  Gaylyn Berish D.O. on 05/08/2017 at 12:10 PM  Between 7am to 7pm - Pager - [phone]  After 7pm go to [email or web address] - password [email or web address]  And look for the night coverage person covering for me after hours  [facility] Office   [phone]

## 2017-05-08 NOTE — Progress Notes (Signed)
Orthopedic Tech Progress Note Patient Details:  ANNALISE MCDIARMID 1919-12-07 [id number]  Ortho Devices Type of Ortho Device: Thumb velcro splint Ortho Device/Splint Location: Left/  family is at bedside to assist with care of device.   Ortho Device/Splint Interventions: Application, Adjustment   Post Interventions Patient Tolerated: Well Instructions Provided: Adjustment of device, Care of device   Kristopher Oppenheim 05/08/2017, 2:23 PM

## 2017-05-08 NOTE — Progress Notes (Signed)
Physical Therapy Treatment Patient Details Name: Theresa Hampton MRN: [id number] DOB: 05-30-1919 Today's Date: 05/08/2017    History of Present Illness 84 y.o. female with history of HTN, CKD, OA, B-12 deficiency, one week history of difficulty walking progressing to increased falls. She was admitted on 05/06/17 am after found by family with left  facial droop. MRI/MRA brain done revealing Right pontine stroke. Sacral/coccyx X-ray reveals fracture of sacrum. L THA anterior 2016.     PT Comments    Pt transferred bed to [facility] and BSC to recliner today but felt light headed with decreased verbalization while she was up. BP stable, see below. RN and family aware. They report her having periods of decreased verbalization at home as well but not the lightheadedness. Unable to attempt ambulation today due to this. Max A +2 for transfers to R with L knee buckling. PT will continue to follow.   Follow Up Recommendations  CIR;Supervision for mobility/OOB     Equipment Recommendations  Other (comment)(to be determined at next venue of care)    Recommendations for Other Services       Precautions / Restrictions Precautions Precautions: Fall Precaution Comments: L painful wrist Restrictions Weight Bearing Restrictions: No    Mobility  Bed Mobility Overal bed mobility: Needs Assistance Bed Mobility: Rolling;Sidelying to Sit Rolling: Min assist Sidelying to sit: HOB elevated;Min assist       General bed mobility comments: pt somewhat confused by task of sitting up once in SL. Min instructional cues as well as as min A to elevate trunk away from bed.   Transfers Overall transfer level: Needs assistance Equipment used: 2 person hand held assist Transfers: Sit to/from [facility] Sit to Stand: +2 safety/equipment;Max assist Stand pivot transfers: Max assist;+2 physical assistance;Mod assist       General transfer comment: Pt needed max A for power up first time  standing. Was able to stand with mod A after 2 trials. Pivoted to [facility] and then to recliner after using BSC, max A +2 for pivot each time with L knee buckling but pt able to step LLE and put some wt on that side. Pt limited by light headedness once she got up.   Ambulation/Gait             General Gait Details: unable to safely attempt due to light headedness   Stairs            Wheelchair Mobility    Modified Rankin (Stroke Patients Only) Modified Rankin (Stroke Patients Only) Pre-Morbid Rankin Score: Slight disability Modified Rankin: Moderately severe disability     Balance Overall balance assessment: Needs assistance Sitting-balance support: Feet supported;No upper extremity supported Sitting balance-Leahy Scale: Fair Sitting balance - Comments: pt able to sit EOB without UE support, however requires help with weight-shifting and scooting to EOB.    Standing balance support: Bilateral upper extremity supported;During functional activity Standing balance-Leahy Scale: Poor Standing balance comment: Pt requiring mod/ max assist to maintain standing balance.                             Cognition Arousal/Alertness: Awake/alert Behavior During Therapy: Flat affect Overall Cognitive Status: Impaired/Different from baseline Area of Impairment: Problem solving                   Current Attention Level: Sustained     Safety/Judgement: Decreased awareness of safety Awareness: Emergent Problem Solving: Slow processing;Requires verbal cues General  Comments: Pt with appropriate responses. Pt requiring verbal cues for bed mobility and ambulation. Pt with periods of decreased verbalization and staring though would respond when spoken to. This was combined with light headedness though daughter reports that she had periods like this at home PTA      Exercises      General Comments General comments (skin integrity, edema, etc.): When pt first became light  headed, BP 153/57 with HR 91 bpm. After transfers, pt with decreased veralization and still reporting light headedness and BP 154/67 with HR 96 bpm. RN notified of pt feeling light headed as well as BP      Pertinent Vitals/Pain Pain Assessment: Faces Faces Pain Scale: Hurts even more Pain Location: buttocks, neck, L wrist Pain Descriptors / Indicators: Aching;Sore Pain Intervention(s): Limited activity within patient's tolerance;Monitored during session;Premedicated before session    Home Living                      Prior Function            PT Goals (current goals can now be found in the care plan section) Acute Rehab PT Goals Patient Stated Goal: to get back to independence PT Goal Formulation: With patient Time For Goal Achievement: 05/21/17 Potential to Achieve Goals: Good Progress towards PT goals: Not progressing toward goals - comment(light headed today)    Frequency    Min 4X/week      PT Plan Current plan remains appropriate    Co-evaluation              AM-PAC PT "6 Clicks" Daily Activity  Outcome Measure  Difficulty turning over in bed (including adjusting bedclothes, sheets and blankets)?: Unable Difficulty moving from lying on back to sitting on the side of the bed? : Unable Difficulty sitting down on and standing up from a chair with arms (e.g., wheelchair, bedside commode, etc,.)?: Unable Help needed moving to and from a bed to chair (including a wheelchair)?: A Lot Help needed walking in hospital room?: A Lot Help needed climbing 3-5 steps with a railing? : Total 6 Click Score: 8    End of Session Equipment Utilized During Treatment: Gait belt Activity Tolerance: Treatment limited secondary to medical complications (Comment) Patient left: with call bell/phone within reach;with family/visitor present;in chair Nurse Communication: Mobility status PT Visit Diagnosis: Hemiplegia and hemiparesis;Unsteadiness on feet (R26.81);Difficulty in  walking, not elsewhere classified (R26.2);Pain Hemiplegia - Right/Left: Left Hemiplegia - dominant/non-dominant: Dominant Hemiplegia - caused by: Cerebral infarction Pain - Right/Left: Left Pain - part of body: (wrist, sacral area)     Time: 0762-2633 PT Time Calculation (min) (ACUTE ONLY): 34 min  Charges:  $Therapeutic Activity: 23-37 mins                    G Codes:       Leighton Roach, PT  Acute Rehab Services  [place] 05/08/2017, 2:05 PM

## 2017-05-09 ENCOUNTER — Encounter (HOSPITAL_COMMUNITY): Payer: Self-pay

## 2017-05-09 ENCOUNTER — Inpatient Hospital Stay (HOSPITAL_COMMUNITY)
Admission: RE | Admit: 2017-05-09 | Discharge: 2017-05-25 | DRG: 057 | Disposition: A | Discharge status: Home Health | Payer: Medicare Other | Source: Intra-hospital | Attending: Physical Medicine & Rehabilitation | Admitting: Physical Medicine & Rehabilitation

## 2017-05-09 ENCOUNTER — Other Ambulatory Visit: Payer: Self-pay

## 2017-05-09 DIAGNOSIS — E538 Deficiency of other specified B group vitamins: Secondary | ICD-10-CM | POA: Diagnosis present

## 2017-05-09 DIAGNOSIS — I10 Essential (primary) hypertension: Secondary | ICD-10-CM

## 2017-05-09 DIAGNOSIS — Z8261 Family history of arthritis: Secondary | ICD-10-CM

## 2017-05-09 DIAGNOSIS — I129 Hypertensive chronic kidney disease with stage 1 through stage 4 chronic kidney disease, or unspecified chronic kidney disease: Secondary | ICD-10-CM | POA: Diagnosis present

## 2017-05-09 DIAGNOSIS — D509 Iron deficiency anemia, unspecified: Secondary | ICD-10-CM

## 2017-05-09 DIAGNOSIS — R1310 Dysphagia, unspecified: Secondary | ICD-10-CM | POA: Diagnosis present

## 2017-05-09 DIAGNOSIS — I951 Orthostatic hypotension: Secondary | ICD-10-CM | POA: Diagnosis present

## 2017-05-09 DIAGNOSIS — I69322 Dysarthria following cerebral infarction: Secondary | ICD-10-CM

## 2017-05-09 DIAGNOSIS — Z7982 Long term (current) use of aspirin: Secondary | ICD-10-CM

## 2017-05-09 DIAGNOSIS — N189 Chronic kidney disease, unspecified: Secondary | ICD-10-CM | POA: Diagnosis present

## 2017-05-09 DIAGNOSIS — Z9181 History of falling: Secondary | ICD-10-CM

## 2017-05-09 DIAGNOSIS — I69391 Dysphagia following cerebral infarction: Secondary | ICD-10-CM | POA: Diagnosis not present

## 2017-05-09 DIAGNOSIS — M810 Age-related osteoporosis without current pathological fracture: Secondary | ICD-10-CM | POA: Diagnosis present

## 2017-05-09 DIAGNOSIS — G4700 Insomnia, unspecified: Secondary | ICD-10-CM | POA: Diagnosis present

## 2017-05-09 DIAGNOSIS — R1311 Dysphagia, oral phase: Secondary | ICD-10-CM | POA: Diagnosis present

## 2017-05-09 DIAGNOSIS — G8194 Hemiplegia, unspecified affecting left nondominant side: Secondary | ICD-10-CM | POA: Diagnosis not present

## 2017-05-09 DIAGNOSIS — G479 Sleep disorder, unspecified: Secondary | ICD-10-CM | POA: Diagnosis not present

## 2017-05-09 DIAGNOSIS — F411 Generalized anxiety disorder: Secondary | ICD-10-CM

## 2017-05-09 DIAGNOSIS — Z79890 Hormone replacement therapy (postmenopausal): Secondary | ICD-10-CM | POA: Diagnosis not present

## 2017-05-09 DIAGNOSIS — S3210XD Unspecified fracture of sacrum, subsequent encounter for fracture with routine healing: Secondary | ICD-10-CM

## 2017-05-09 DIAGNOSIS — Z96643 Presence of artificial hip joint, bilateral: Secondary | ICD-10-CM | POA: Diagnosis present

## 2017-05-09 DIAGNOSIS — R269 Unspecified abnormalities of gait and mobility: Secondary | ICD-10-CM | POA: Diagnosis not present

## 2017-05-09 DIAGNOSIS — I69354 Hemiplegia and hemiparesis following cerebral infarction affecting left non-dominant side: Secondary | ICD-10-CM | POA: Diagnosis not present

## 2017-05-09 DIAGNOSIS — E785 Hyperlipidemia, unspecified: Secondary | ICD-10-CM | POA: Diagnosis present

## 2017-05-09 DIAGNOSIS — I6350 Cerebral infarction due to unspecified occlusion or stenosis of unspecified cerebral artery: Secondary | ICD-10-CM | POA: Diagnosis present

## 2017-05-09 DIAGNOSIS — Z8711 Personal history of peptic ulcer disease: Secondary | ICD-10-CM | POA: Diagnosis not present

## 2017-05-09 DIAGNOSIS — I69398 Other sequelae of cerebral infarction: Secondary | ICD-10-CM | POA: Diagnosis not present

## 2017-05-09 DIAGNOSIS — M659 Synovitis and tenosynovitis, unspecified: Secondary | ICD-10-CM

## 2017-05-09 MED ORDER — TRAMADOL HCL 50 MG PO TABS
25.0000 mg | ORAL_TABLET | Freq: Two times a day (BID) | ORAL | Status: DC | PRN
  Administered 2017-05-09 – 2017-05-10 (×2): 25 mg via ORAL
  Filled 2017-05-09 (×2): qty 1

## 2017-05-09 MED ORDER — ACETAMINOPHEN 325 MG PO TABS
325.0000 mg | ORAL_TABLET | ORAL | Status: DC | PRN
  Administered 2017-05-09 – 2017-05-19 (×2): 650 mg via ORAL
  Administered 2017-05-24: 325 mg via ORAL
  Filled 2017-05-09 (×2): qty 2

## 2017-05-09 MED ORDER — ALUM & MAG HYDROXIDE-SIMETH 200-200-20 MG/5ML PO SUSP: 30.0000 mL | ORAL | Status: DC | PRN

## 2017-05-09 MED ORDER — VITAMIN B-12 1000 MCG PO TABS
500.0000 ug | ORAL_TABLET | Freq: Every day | ORAL | Status: DC
  Administered 2017-05-10 – 2017-05-25 (×16): 500 ug via ORAL
  Filled 2017-05-09 (×16): qty 1

## 2017-05-09 MED ORDER — ATORVASTATIN CALCIUM 40 MG PO TABS
40.0000 mg | ORAL_TABLET | Freq: Every day | ORAL | Status: DC
  Administered 2017-05-09 – 2017-05-24 (×16): 40 mg via ORAL
  Filled 2017-05-09 (×16): qty 1

## 2017-05-09 MED ORDER — AMLODIPINE BESYLATE 2.5 MG PO TABS
2.5000 mg | ORAL_TABLET | Freq: Every day | ORAL | Status: DC
  Administered 2017-05-10 – 2017-05-25 (×14): 2.5 mg via ORAL
  Filled 2017-05-09 (×15): qty 1

## 2017-05-09 MED ORDER — DOCUSATE SODIUM 100 MG PO CAPS
100.0000 mg | ORAL_CAPSULE | Freq: Every day | ORAL | Status: DC
  Administered 2017-05-09 – 2017-05-24 (×16): 100 mg via ORAL
  Filled 2017-05-09 (×16): qty 1

## 2017-05-09 MED ORDER — LATANOPROST 0.005 % OP SOLN
1.0000 [drp] | Freq: Every day | OPHTHALMIC | Status: DC
  Administered 2017-05-09 – 2017-05-24 (×16): 1 [drp] via OPHTHALMIC
  Filled 2017-05-09: qty 2.5

## 2017-05-09 MED ORDER — CLOPIDOGREL BISULFATE 75 MG PO TABS: 75.0000 mg | ORAL_TABLET | Freq: Every day | ORAL | 0 refills | Status: DC

## 2017-05-09 MED ORDER — PROCHLORPERAZINE 25 MG RE SUPP: 12.5000 mg | Freq: Four times a day (QID) | RECTAL | Status: DC | PRN

## 2017-05-09 MED ORDER — TRAZODONE HCL 50 MG PO TABS
25.0000 mg | ORAL_TABLET | Freq: Every evening | ORAL | Status: DC | PRN
  Administered 2017-05-09 – 2017-05-24 (×15): 25 mg via ORAL
  Filled 2017-05-09 (×15): qty 1

## 2017-05-09 MED ORDER — MIRTAZAPINE 15 MG PO TABS
15.0000 mg | ORAL_TABLET | Freq: Every day | ORAL | Status: DC
  Administered 2017-05-09 – 2017-05-24 (×16): 15 mg via ORAL
  Filled 2017-05-09 (×16): qty 1

## 2017-05-09 MED ORDER — ENOXAPARIN SODIUM 40 MG/0.4ML ~~LOC~~ SOLN
40.0000 mg | SUBCUTANEOUS | Status: DC
  Administered 2017-05-09 – 2017-05-24 (×16): 40 mg via SUBCUTANEOUS
  Filled 2017-05-09 (×16): qty 0.4

## 2017-05-09 MED ORDER — DIPHENHYDRAMINE HCL 12.5 MG/5ML PO ELIX: 12.5000 mg | ORAL_SOLUTION | Freq: Four times a day (QID) | ORAL | Status: DC | PRN

## 2017-05-09 MED ORDER — FLEET ENEMA 7-19 GM/118ML RE ENEM: 1.0000 | ENEMA | Freq: Once | RECTAL | Status: DC | PRN

## 2017-05-09 MED ORDER — SENNOSIDES-DOCUSATE SODIUM 8.6-50 MG PO TABS: 1.0000 | ORAL_TABLET | Freq: Every evening | ORAL | 0 refills | Status: DC | PRN

## 2017-05-09 MED ORDER — CLONAZEPAM 0.25 MG PO TBDP
0.2500 mg | ORAL_TABLET | Freq: Two times a day (BID) | ORAL | Status: DC | PRN
  Administered 2017-05-10: 0.25 mg via ORAL
  Filled 2017-05-09 (×2): qty 1

## 2017-05-09 MED ORDER — GABAPENTIN 100 MG PO CAPS
100.0000 mg | ORAL_CAPSULE | Freq: Every day | ORAL | Status: DC
  Administered 2017-05-09: 100 mg via ORAL
  Filled 2017-05-09: qty 1

## 2017-05-09 MED ORDER — ATORVASTATIN CALCIUM 40 MG PO TABS: 40.0000 mg | ORAL_TABLET | Freq: Every day | ORAL | 0 refills | Status: DC

## 2017-05-09 MED ORDER — BISACODYL 10 MG RE SUPP
10.0000 mg | Freq: Every day | RECTAL | Status: DC | PRN
  Administered 2017-05-11 – 2017-05-18 (×2): 10 mg via RECTAL
  Filled 2017-05-09 (×2): qty 1

## 2017-05-09 MED ORDER — CLOPIDOGREL BISULFATE 75 MG PO TABS
75.0000 mg | ORAL_TABLET | Freq: Every day | ORAL | Status: DC
  Administered 2017-05-10 – 2017-05-25 (×16): 75 mg via ORAL
  Filled 2017-05-09 (×16): qty 1

## 2017-05-09 MED ORDER — VITAMIN D 1000 UNITS PO TABS
2000.0000 [IU] | ORAL_TABLET | Freq: Every day | ORAL | Status: DC
  Administered 2017-05-09 – 2017-05-24 (×16): 2000 [IU] via ORAL
  Filled 2017-05-09 (×16): qty 2

## 2017-05-09 MED ORDER — PROCHLORPERAZINE MALEATE 5 MG PO TABS: 5.0000 mg | ORAL_TABLET | Freq: Four times a day (QID) | ORAL | Status: DC | PRN

## 2017-05-09 MED ORDER — POLYETHYLENE GLYCOL 3350 17 G PO PACK
17.0000 g | PACK | Freq: Every day | ORAL | Status: DC | PRN
  Administered 2017-05-09 – 2017-05-17 (×3): 17 g via ORAL
  Filled 2017-05-09 (×3): qty 1

## 2017-05-09 MED ORDER — ACETAMINOPHEN 325 MG PO TABS
650.0000 mg | ORAL_TABLET | Freq: Three times a day (TID) | ORAL | Status: DC
  Administered 2017-05-10 – 2017-05-25 (×45): 650 mg via ORAL
  Filled 2017-05-09 (×45): qty 2

## 2017-05-09 MED ORDER — PROCHLORPERAZINE EDISYLATE 5 MG/ML IJ SOLN: 5.0000 mg | Freq: Four times a day (QID) | INTRAMUSCULAR | Status: DC | PRN

## 2017-05-09 MED ORDER — GUAIFENESIN-DM 100-10 MG/5ML PO SYRP: 5.0000 mL | ORAL_SOLUTION | Freq: Four times a day (QID) | ORAL | Status: DC | PRN

## 2017-05-09 MED ORDER — DICLOFENAC SODIUM 1 % TD GEL
2.0000 g | Freq: Four times a day (QID) | TRANSDERMAL | Status: DC
  Administered 2017-05-09 – 2017-05-25 (×45): 2 g via TOPICAL
  Filled 2017-05-09 (×2): qty 100

## 2017-05-09 NOTE — Progress Notes (Signed)
Occupational Therapy Treatment Patient Details Name: Theresa Hampton MRN: [id number] DOB: 1919-05-25 Today's Date: 05/09/2017    History of present illness 84 y.o. female with history of HTN, CKD, OA, B-12 deficiency, one week history of difficulty walking progressing to increased falls. She was admitted on 05/06/17 am after found by family with left  facial droop. MRI/MRA brain done revealing Right pontine stroke. Sacral/coccyx X-ray reveals fracture of sacrum. L THA anterior 2016.    OT comments  Pt making steady progress. Increased postural control in sitting and increasing strength LU/LE. Pt tearful at times, expressing her sadness over her difficulty doing things that were "once so easy to do". Pt is very motivated to return to her PLOF. Continue to recommend CIR.   Follow Up Recommendations  CIR;Supervision/Assistance - 24 hour    Equipment Recommendations  Other (comment)(TBA)    Recommendations for Other Services Rehab consult    Precautions / Restrictions Precautions Precautions: Fall       Mobility Bed Mobility Overal bed mobility: Needs Assistance Bed Mobility: Supine to Sit     Supine to sit: Mod assist     General bed mobility comments: facilitation through trunk and min A to move LLE to EOB; facilitation through LUE - weight bearing through hand without complaints of pain  Transfers Overall transfer level: Needs assistance   Transfers: Squat Pivot Transfers     Squat pivot transfers: Mod assist          Balance Overall balance assessment: Needs assistance Sitting-balance support: Feet supported Sitting balance-Leahy Scale: Fair Sitting balance - Comments: Able to sit EOB and maintain midline postrual control; R lateral lean when distracted but pt adjusting with min vc     Standing balance-Leahy Scale: Poor                             ADL either performed or assessed with clinical judgement   ADL Overall ADL's : Needs  assistance/impaired Eating/Feeding: Minimal assistance;Supervision/ safety Eating/Feeding Details (indicate cue type and reason): encouraged pt to use L hand as functional assist Grooming: Wash/dry hands;Brushing hair;Moderate assistance Grooming Details (indicate cue type and reason): Pt combing her hair; encouraged to comb L side of haed                             Functional mobility during ADLs: Moderate assistance(squat pivot transfer to R)       Vision   Vision Assessment?: Vision impaired- to be further tested in functional context   Perception     Praxis      Cognition Arousal/Alertness: Awake/alert Behavior During Therapy: WFL for tasks assessed/performed(tearful) Overall Cognitive Status: Impaired/Different from baseline Area of Impairment: Problem solving                   Current Attention Level: Sustained     Safety/Judgement: Decreased awareness of safety Awareness: Emergent Problem Solving: Slow processing;Requires verbal cues          Exercises Exercises: Other exercises Other Exercises Other Exercises: LUE A/AAROM during functional activities - increasing strength; isolated movements; generalized weakness; using as a funcitonal assist with mod a   Shoulder Instructions       General Comments      Pertinent Vitals/ Pain       Pain Assessment: Faces Faces Pain Scale: Hurts little more Pain Location: bottom Pain Descriptors / Indicators: Aching;Sore Pain Intervention(s):  Limited activity within patient's tolerance  Home Living                                          Prior Functioning/Environment              Frequency  Min 2X/week        Progress Toward Goals  OT Goals(current goals can now be found in the care plan section)  Progress towards OT goals: Progressing toward goals  Acute Rehab OT Goals Patient Stated Goal: to get back to independence OT Goal Formulation: With  patient/family Time For Goal Achievement: 05/22/16 Potential to Achieve Goals: Good ADL Goals Pt Will Perform Eating: with modified independence;sitting Pt Will Perform Grooming: with set-up;with supervision;sitting Pt Will Perform Upper Body Bathing: with set-up;with supervision;sitting Pt Will Transfer to Toilet: bedside commode;with min assist  Plan Discharge plan remains appropriate    Co-evaluation                 AM-PAC PT "6 Clicks" Daily Activity     Outcome Measure   Help from another person eating meals?: A Little Help from another person taking care of personal grooming?: A Lot Help from another person toileting, which includes using toliet, bedpan, or urinal?: A Lot Help from another person bathing (including washing, rinsing, drying)?: A Lot Help from another person to put on and taking off regular upper body clothing?: A Lot Help from another person to put on and taking off regular lower body clothing?: A Lot 6 Click Score: 13    End of Session Equipment Utilized During Treatment: Gait belt  OT Visit Diagnosis: Other abnormalities of gait and mobility (R26.89);History of falling (Z91.81);Muscle weakness (generalized) (M62.81);Other symptoms and signs involving cognitive function;Pain Pain - part of body: (bottom)   Activity Tolerance Patient tolerated treatment well   Patient Left in chair;with call bell/phone within reach;with chair alarm set   Nurse Communication Mobility status        Time: 2409-7353 OT Time Calculation (min): 38 min  Charges: OT General Charges $OT Visit: 1 Visit OT Treatments $Self Care/Home Management : 23-37 mins $Neuromuscular Re-education: 8-22 mins  [facility], OT/L  [phone] 05/09/2017   Jayvier Burgher,HILLARY 05/09/2017, 12:01 PM

## 2017-05-09 NOTE — Progress Notes (Signed)
Rehab admissions - I met with patient, her 2 daughters and other family this am.  All are in favor of inpatient rehab admission.  I have medical clearance for acute inpatient rehab admission for today. Bed available and will admit to acute inpatient rehab today.  Call me for questions.  #[phone]

## 2017-05-09 NOTE — Progress Notes (Signed)
  Speech Language Pathology Treatment: Dysphagia  Patient Details Name: Theresa Hampton MRN: [id number] DOB: 1920-02-12 Today's Date: 05/09/2017 Time: 0932-6712 SLP Time Calculation (min) (ACUTE ONLY): 15 min  Assessment / Plan / Recommendation Clinical Impression  Pt seen with caregiver present who reports pt continuing to have mild pocketing. Provided toothette sponges and SLP  retrieved mild residue from left buccal cavity. Did not attempt upgraded texture. Caregiver having difficulty receiving appropriate textures (puree fruit). Oral holding and hesitation with sips thin however no s/s aspiration. Suggested pt and caregiver discuss  Menu/D2 options with SLP on CIR and possibly dietitian (transferring to CIR today). Continue D2, thin . Did not address cognition/dysarthria this session d/t RN arrived to transit to rehab.    HPI HPI: Ptis a 84 y.o.left-handed femalewho has a past medical history of hypertension, hyperlipidemia, peptic ulcer disease, vitamin D and B12 deficiency, and CKD, who was admitted with 2 days of L facial droop and falls. MRI showed ana cute ischemic stroke in the R pons. Pt initially passed the stroke swallow screen but has had fluctuating slurred speech. The screen was therefore repeated and she did not pass. PTA pt lived alone but had a nurse who came almost every day, and her daughters live nearby.      SLP Plan  Other (Comment)(discharging to rehab today)       Recommendations  Diet recommendations: Dysphagia 2 (fine chop);Thin liquid Liquids provided via: Cup;Straw Medication Administration: Whole meds with puree Supervision: Patient able to self feed;Full supervision/cueing for compensatory strategies Compensations: Lingual sweep for clearance of pocketing;Small sips/bites;Slow rate Postural Changes and/or Swallow Maneuvers: Seated upright 90 degrees                General recommendations: Rehab consult Oral Care Recommendations: Oral care  BID Follow up Recommendations: Inpatient Rehab SLP Visit Diagnosis: Dysphagia, unspecified (R13.10) Plan: Other (Comment)(discharging to rehab today)       GO                Houston Siren 05/09/2017, 2:45 PM Orbie Pyo Colvin Caroli.[employer] [phone]

## 2017-05-09 NOTE — Progress Notes (Signed)
Orthopedic Tech Progress Note Patient Details:  Theresa Hampton 1920-02-16 [id number]  Patient ID: Theresa Hampton, female   DOB: 04-13-1920, 84 y.o.   MRN: [id number]   Theresa Hampton 05/09/2017, 3:28 PMCalled [facility] left PRAFO.

## 2017-05-09 NOTE — Progress Notes (Signed)
Pt arrived to unit, alert and oriented, visitor at bedside, pain c/o 5 out of 10 to left wrist and buttocks, gave PRN Tylenol and Ultram with good results, also turned pt on to right side.

## 2017-05-09 NOTE — Discharge Instructions (Signed)
Stroke Prevention °Some medical conditions and behaviors are associated with a higher chance of having a stroke. You can help prevent a stroke by making nutrition, lifestyle, and other changes, including managing any medical conditions you may have. °What nutrition changes can be made? °· Eat healthy foods. You can do this by: °? Choosing foods high in fiber, such as fresh fruits and vegetables and whole grains. °? Eating at least 5 or more servings of fruits and vegetables a day. Try to fill half of your plate at each meal with fruits and vegetables. °? Choosing lean protein foods, such as lean cuts of meat, poultry without skin, fish, tofu, beans, and nuts. °? Eating low-fat dairy products. °? Avoiding foods that are high in salt (sodium). This can help lower blood pressure. °? Avoiding foods that have saturated fat, trans fat, and cholesterol. This can help prevent high cholesterol. °? Avoiding processed and premade foods. °· Follow your health care provider's specific guidelines for losing weight, controlling high blood pressure (hypertension), lowering high cholesterol, and managing diabetes. These may include: °? Reducing your daily calorie intake. °? Limiting your daily sodium intake to 1,500 milligrams (mg). °? Using only healthy fats for cooking, such as olive oil, canola oil, or sunflower oil. °? Counting your daily carbohydrate intake. °What lifestyle changes can be made? °· Maintain a healthy weight. Talk to your health care provider about your ideal weight. °· Get at least 30 minutes of moderate physical activity at least 5 days a week. Moderate activity includes brisk walking, biking, and swimming. °· Do not use any products that contain nicotine or tobacco, such as cigarettes and e-cigarettes. If you need help quitting, ask your health care provider. It may also be helpful to avoid exposure to secondhand smoke. °· Limit alcohol intake to no more than 1 drink a day for nonpregnant women and 2 drinks a  day for men. One drink equals 12 oz of beer, 5 oz of wine, or 1½ oz of hard liquor. °· Stop any illegal drug use. °· Avoid taking birth control pills. Talk to your health care provider about the risks of taking birth control pills if: °? You are over 35 years old. °? You smoke. °? You get migraines. °? You have ever had a blood clot. °What other changes can be made? °· Manage your cholesterol levels. °? Eating a healthy diet is important for preventing high cholesterol. If cholesterol cannot be managed through diet alone, you may also need to take medicines. °? Take any prescribed medicines to control your cholesterol as told by your health care provider. °· Manage your diabetes. °? Eating a healthy diet and exercising regularly are important parts of managing your blood sugar. If your blood sugar cannot be managed through diet and exercise, you may need to take medicines. °? Take any prescribed medicines to control your diabetes as told by your health care provider. °· Control your hypertension. °? To reduce your risk of stroke, try to keep your blood pressure below 130/80. °? Eating a healthy diet and exercising regularly are an important part of controlling your blood pressure. If your blood pressure cannot be managed through diet and exercise, you may need to take medicines. °? Take any prescribed medicines to control hypertension as told by your health care provider. °? Ask your health care provider if you should monitor your blood pressure at home. °? Have your blood pressure checked every year, even if your blood pressure is normal. Blood pressure increases with   age and some medical conditions. °· Get evaluated for sleep disorders (sleep apnea). Talk to your health care provider about getting a sleep evaluation if you snore a lot or have excessive sleepiness. °· Take over-the-counter and prescription medicines only as told by your health care provider. Aspirin or blood thinners (antiplatelets or  anticoagulants) may be recommended to reduce your risk of forming blood clots that can lead to stroke. °· Make sure that any other medical conditions you have, such as atrial fibrillation or atherosclerosis, are managed. °What are the warning signs of a stroke? °The warning signs of a stroke can be easily remembered as BEFAST. °· B is for balance. Signs include: °? Dizziness. °? Loss of balance or coordination. °? Sudden trouble walking. °· E is for eyes. Signs include: °? A sudden change in vision. °? Trouble seeing. °· F is for face. Signs include: °? Sudden weakness or numbness of the face. °? The face or eyelid drooping to one side. °· A is for arms. Signs include: °? Sudden weakness or numbness of the arm, usually on one side of the body. °· S is for speech. Signs include: °? Trouble speaking (aphasia). °? Trouble understanding. °· T is for time. °? These symptoms may represent a serious problem that is an emergency. Do not wait to see if the symptoms will go away. Get medical help right away. Call your local emergency services (911 in the [place] Do not drive yourself to the hospital. °· Other signs of stroke may include: °? A sudden, severe headache with no known cause. °? Nausea or vomiting. °? Seizure. ° °Where to find more information: °For more information, visit: °· American Stroke Association: www.strokeassociation.org °· National Stroke Association: www.stroke.org ° °Summary °· You can prevent a stroke by eating healthy, exercising, not smoking, limiting alcohol intake, and managing any medical conditions you may have. °· Do not use any products that contain nicotine or tobacco, such as cigarettes and e-cigarettes. If you need help quitting, ask your health care provider. It may also be helpful to avoid exposure to secondhand smoke. °· Remember BEFAST for warning signs of stroke. Get help right away if you or a loved one has any of these signs. °This information is not intended to replace advice given  to you by your health care provider. Make sure you discuss any questions you have with your health care provider. °Document Released: 06/01/2004 Document Revised: 05/30/2016 Document Reviewed: 05/30/2016 °[employer]vier Interactive Patient Education © 2018 [employer] ° °

## 2017-05-09 NOTE — Progress Notes (Signed)
Physical Therapy Treatment Patient Details Name: Theresa Hampton MRN: [id number] DOB: [birth year] Today's Date: 05/09/2017    History of Present Illness 84 y.o. female with history of HTN, CKD, OA, B-12 deficiency, one week history of difficulty walking progressing to increased falls. She was admitted on 05/06/17 am after found by family with left  facial droop. MRI/MRA brain done revealing Right pontine stroke. Sacral/coccyx X-ray reveals fracture of sacrum. L THA anterior 2016.     PT Comments    Pt progresses towards goals, tolerating slightly more activity than previous PT session and with no c/o lightheadedness. However pt still demonstrating major mobility impairments due to weakness of BLE (LLE>RLE). Pt tolerates sit-to-stand, a few feet of ambulation, and 2x SPT with max physical assist +1 and +1 for safety/equipment. Pt demonstrates difficulty in determining right from left and requiring mod verbal cueing for progressing LEs during gait training. Current discharge plan remains appropriate, as pt independent PTA and motivated to return to independent function. PT will follow acutely in order to maximize mobility while in hospital setting.    Follow Up Recommendations  CIR;Supervision for mobility/OOB     Equipment Recommendations  Other (comment)(TBD by next venue of care)    Recommendations for Other Services       Precautions / Restrictions Precautions Precautions: Fall Precaution Comments: L painful wrist Restrictions Weight Bearing Restrictions: No    Mobility  Bed Mobility Overal bed mobility: Needs Assistance Bed Mobility: Supine to Sit     Supine to sit: Mod assist     General bed mobility comments: Pt OOB in chair upon PT arrival.   Transfers Overall transfer level: Needs assistance Equipment used: 2 person hand held assist Transfers: Stand Pivot Transfers;Sit to/from Stand Sit to Stand: +2 safety/equipment;Max assist Stand pivot transfers: Max assist;+2  safety/equipment      General transfer comment: sit-to-stand x1 from [facility]. SPT from chair to [facility] and BSC to chair. Pt requiring verbal cueing for sequencing, hand placement, weight shifting for steps/forward progression, and standing upright. Max assist to power up from standing and maintain standing balance.  Right and left knee instability - requiring blocking.   Ambulation/Gait   Ambulation Distance (Feet): 4 Feet Assistive device: 2 person hand held assist Gait Pattern/deviations: Step-to pattern;Decreased step length - right;Decreased step length - left;Narrow base of support;Trunk flexed Gait velocity: decreased   General Gait Details: Pt requiring VCs for sequencing and weight shifting to progress LEs. Pt with difficulty following commands and requiring increased time to determine right from left. Requires blocking of right knee (instability) and left knee at times.    Stairs            Wheelchair Mobility    Modified Rankin (Stroke Patients Only) Modified Rankin (Stroke Patients Only) Pre-Morbid Rankin Score: Slight disability Modified Rankin: Moderately severe disability     Balance Overall balance assessment: Needs assistance Sitting-balance support: Feet supported;Bilateral upper extremity supported Sitting balance-Leahy Scale: Fair Sitting balance - Comments: Able to sit on BSC and chair without external support and no LOB, however requires assistance for weight-shifting.    Standing balance support: Bilateral upper extremity supported;During functional activity Standing balance-Leahy Scale: Zero Standing balance comment: Pt requiring max assist to maintain standing balance.                             Cognition Arousal/Alertness: Awake/alert Behavior During Therapy: WFL for tasks assessed/performed Overall Cognitive Status: No family/caregiver present to  determine baseline cognitive functioning Area of Impairment: Problem solving;Following  commands                      Following Commands: Follows multi-step commands inconsistently;Follows one step commands with increased time Safety/Judgement: Decreased awareness of safety Awareness: Emergent Problem Solving: Difficulty sequencing;Decreased initiation;Slow processing;Requires verbal cues;Requires tactile cues General Comments: Pt with difficulty sequencing movements, following commands, and determining left from right side (able to determine, but requiring increased time).       Exercises Other Exercises Other Exercises: LUE A/AAROM during functional activities - increasing strength; isolated movements; generalized weakness; using as a funcitonal assist with mod a    General Comments        Pertinent Vitals/Pain Pain Assessment: Faces Faces Pain Scale: Hurts little more Pain Location: bottom and left wrist Pain Descriptors / Indicators: Aching;Grimacing Pain Intervention(s): Limited activity within patient's tolerance    Home Living                      Prior Function            PT Goals (current goals can now be found in the care plan section) Acute Rehab PT Goals Patient Stated Goal: to get back to independence Progress towards PT goals: Progressing toward goals    Frequency    Min 4X/week      PT Plan Current plan remains appropriate    Co-evaluation              AM-PAC PT "6 Clicks" Daily Activity  Outcome Measure  Difficulty turning over in bed (including adjusting bedclothes, sheets and blankets)?: Unable Difficulty moving from lying on back to sitting on the side of the bed? : Unable Difficulty sitting down on and standing up from a chair with arms (e.g., wheelchair, bedside commode, etc,.)?: Unable Help needed moving to and from a bed to chair (including a wheelchair)?: A Lot Help needed walking in hospital room?: A Lot Help needed climbing 3-5 steps with a railing? : Total 6 Click Score: 8    End of Session  Equipment Utilized During Treatment: Gait belt Activity Tolerance: Patient tolerated treatment well Patient left: in chair;with chair alarm set;Other (comment)(with PA in room)   PT Visit Diagnosis: Hemiplegia and hemiparesis;Unsteadiness on feet (R26.81);Difficulty in walking, not elsewhere classified (R26.2);Pain Hemiplegia - Right/Left: Left Hemiplegia - dominant/non-dominant: Dominant Hemiplegia - caused by: Cerebral infarction Pain - Right/Left: Left Pain - part of body: (bottom and wrist)     Time: 2353-6144 PT Time Calculation (min) (ACUTE ONLY): 31 min  Charges:  $Gait Training: 8-22 mins $Therapeutic Activity: 8-22 mins                    G Codes:       Judee Clara, SPT   Judee Clara 05/09/2017, 12:56 PM

## 2017-05-09 NOTE — Care Management Note (Signed)
Case Management Note  Patient Details  Name: Theresa Hampton MRN: [id number] Date of Birth: Jan 07, 1920  Subjective/Objective:                    Action/Plan: Pt discharging to CIR today. No further needs per CM.  Expected Discharge Date:  05/09/17               Expected Discharge Plan:  IP Rehab Facility  In-House Referral:  Clinical Social Work  Discharge planning Services     Post Acute Care Choice:    Choice offered to:     DME Arranged:    DME Agency:     HH Arranged:    [place] Agency:     Status of Service:  Completed, signed off  If discussed at [employer], dates discussed:    Additional Comments:  Pollie Friar, RN 05/09/2017, 1:34 PM

## 2017-05-09 NOTE — Progress Notes (Signed)
Jamse Arn, MD  Physician  Physical Medicine and Rehabilitation  Consult Note  Signed  Date of Service:  05/07/2017 12:22 PM       Related encounter: ED to Hosp-Admission (Current) from 05/06/2017 in [place] 3W [facility]      Signed      Expand All Collapse All       [] Hide copied text  [] Hover for details        Physical Medicine and Rehabilitation Consult   Reason for Consult: Right pontine stroke with falls and facial weakness  Referring Physician: Dr. Erlinda Hong   HPI: Theresa Hampton is a 84 y.o. female with history of HTN, CKD, OA, B-12 deficiency, one week history of difficulty walking progressing to increased falls. History taken from chart review and patient.  She was admitted on 05/06/17 AM after found by family with left  facial droop. MRI brain reviewed, showing right pontine CVA.  Per report MRI/MRA right pontine stroke with moderate stenosis at origin of R-VA and moderate stenosis L-SCA. Neurology recommended changing ASA to Plavix for stroke likely due to SVD. Work up and therapy evaluations underway.  Speech therapy evaluation revealed deficits in awareness, problem solving and attention. CIR recommended due to functional deficits.   She was independent with ADLs and mobility PTA and used walker when tired. Baked a cake for the holidays. Has had an aide (since [facility]) who comes in for a few hours daily?    Review of Systems  Constitutional: Negative for chills and fever.  HENT: Negative for hearing loss and tinnitus.   Eyes: Negative for blurred vision and double vision.  Respiratory: Negative for shortness of breath.   Cardiovascular: Negative for chest pain and palpitations.  Gastrointestinal: Negative for abdominal pain and heartburn.  Genitourinary: Negative for dysuria.  Musculoskeletal: Positive for joint pain (left wrist pain). Negative for back pain and myalgias.  Neurological: Positive for focal weakness and weakness.  Negative for dizziness and headaches.  Psychiatric/Behavioral: The patient does not have insomnia.   All other systems reviewed and are negative.        Past Medical History:  Diagnosis Date  . Allergic rhinitis   . Anemia    iron deficiency  . Chronic kidney disease    Pyelo 2014  . Chronic sinusitis   . Complication of anesthesia   . History of transfusion   . Hyperlipidemia   . Hypertension   . Leg cramps   . Osteoarthritis    Dr. Maureen Ralphs  . Osteoporosis   . Peptic ulcer disease 2003  . PONV (postoperative nausea and vomiting)   . Seizures (HCC)    x1 with Lidocaine  . Vitamin B12 deficiency   . Vitamin D deficiency          Past Surgical History:  Procedure Laterality Date  . JOINT REPLACEMENT  2004   rt total hip  . THYROIDECTOMY, PARTIAL  "many years ago"  . TOTAL HIP ARTHROPLASTY Left 01/06/2015   Procedure: LEFT TOTAL HIP ARTHROPLASTY ANTERIOR APPROACH;  Surgeon: Gaynelle Arabian, MD;  Location: [facility] ORS;  Service: Orthopedics;  Laterality: Left;  . uterine ablation  2008    Family History  Problem Relation Age of Onset  . Arthritis Mother   . Hypertension Unknown   . Cancer Sister     Social History:  Lives alone and independent PTA. Used walker when fatigued and has an aide who helps daily? Family plans on providing supervision after discharge. She reports that  has never smoked. she has never used smokeless tobacco. She reports that she does not drink alcohol or use drugs.         Allergies  Allergen Reactions  . Lidocaine Hcl     seizure          Medications Prior to Admission  Medication Sig Dispense Refill  . amLODipine (NORVASC) 2.5 MG tablet TAKE 1 TABLET (2.5 MG TOTAL) BY MOUTH DAILY. 90 tablet 1  . aspirin EC 81 MG tablet Take 81 mg by mouth daily.    . Cholecalciferol 2000 UNITS TABS Take 2 each by mouth at bedtime.    . clonazePAM (KLONOPIN) 0.25 MG disintegrating tablet DISSOLVE ONE TABLET BY  MOUTH TWICE A DAY AS NEEDED FOR ANXIETY 60 tablet 2  . cyanocobalamin 500 MCG tablet Take 500 mcg by mouth daily.    Marland Kitchen estradiol (CLIMARA - DOSED IN MG/24 HR) 0.05 mg/24hr patch Place 1 patch (0.05 mg total) onto the skin once a week. 12 patch 1  . fluticasone (FLONASE) 50 MCG/ACT nasal spray Place 1 spray into the nose daily. 16 g 3  . gabapentin (NEURONTIN) 100 MG capsule TAKE 1 CAPSULE (100 MG TOTAL) BY MOUTH AT BEDTIME. 90 capsule 1  . loratadine (CLARITIN) 10 MG tablet Take 1 tablet (10 mg total) by mouth daily. 100 tablet 2  . LUMIGAN 0.01 % SOLN instill 1 drop IN EACH EYE AT BEDTIME  3  . mirtazapine (REMERON) 15 MG tablet TAKE ONE TABLET BY MOUTH EVERY EVENING BEFORE DINNER AT 4 - 5 PM 90 tablet 1  . traMADol (ULTRAM) 50 MG tablet TAKE 1 TABLET BY MOUTH EVERY 6 HOURS as needed for pain 60 tablet 5  . clobetasol cream (TEMOVATE) 3.55 % Apply 1 application topically 2 (two) times daily. Use sparingly. (Patient not taking: Reported on 05/06/2017) 30 g 2  . metoprolol tartrate (LOPRESSOR) 25 MG tablet Take 1 tablet (25 mg total) by mouth 2 (two) times daily. (Patient not taking: Reported on 05/06/2017) 180 tablet 3  . ranitidine (ZANTAC) 150 MG tablet Take 1 tablet (150 mg total) by mouth at bedtime. (Patient not taking: Reported on 05/06/2017) 30 tablet 5    Home: Home Living Type of Home: House  Lives With: Alone, Other (Comment)(has nurse/therapists that come? dtr lives nearby)  Functional History: Functional Status:  Mobility:  ADL:  Cognition: Cognition Overall Cognitive Status: No family/caregiver present to determine baseline cognitive functioning Arousal/Alertness: Awake/alert Orientation Level: Oriented X4 Attention: Sustained, Selective Sustained Attention: Appears intact Selective Attention: Impaired Selective Attention Impairment: Functional basic, Verbal basic Awareness: Impaired Awareness Impairment: Emergent impairment Problem Solving: Impaired Problem  Solving Impairment: Functional basic Cognition Overall Cognitive Status: No family/caregiver present to determine baseline cognitive functioning  Blood pressure (!) 143/74, pulse (!) 102, temperature 99.1 F (37.3 C), temperature source Oral, resp. rate 18, height 5\' 5"  (1.651 m), weight 57.4 kg (126 lb 8.7 oz), SpO2 94 %. Physical Exam  Nursing note and vitals reviewed. Constitutional: She is oriented to person, place, and time. She appears well-developed and well-nourished. She appears distressed.  HENT:  Head: Normocephalic.  Mouth/Throat: Oropharynx is clear and moist.  Eyes: Conjunctivae and EOM are normal. Pupils are equal, round, and reactive to light.  Left forehead hematoma with periorbital ecchymosis  Neck: Normal range of motion. Neck supple.  Cardiovascular: Normal rate and regular rhythm. Frequent extrasystoles are present.  Respiratory: Effort normal and breath sounds normal. No respiratory distress. She has no wheezes.  GI: Soft. Bowel sounds  are normal. She exhibits no distension. There is no tenderness.  Musculoskeletal:  Left wrist tenderness with ROM and min edema noted laterally.   Neurological: She is alert and oriented to person, place, and time.  Left facial weakness with minimal dysarthria.  Tangential but able to answer orientation question without difficulty.  Able to state today's date and follow simple motor commands.  Motor: RUE: 4-/5 proximal to distal LUE: Not moving due to pain B/l LE: Not willing to moving, stating she is in too much pain HOH  Skin: Skin is warm and dry. She is not diaphoretic.  Right periorbital ecchymosis   Psychiatric: Her mood appears anxious. Her speech is tangential            Assessment/Plan: Diagnosis: Right pontine stroke Labs and images independently reviewed.  Records reviewed and summated above. Stroke: Continue secondary stroke prophylaxis and Risk Factor Modification listed below:   Antiplatelet therapy:     Blood Pressure Management:  Continue current medication with prn's with permisive HTN per primary team Statin Agent:   ?Left sided hemiparesis Motor recovery: Fluoxetine  1. Does the need for close, 24 hr/day medical supervision in concert with the patient's rehab needs make it unreasonable for this patient to be served in a less intensive setting? Yes  2. Co-Morbidities requiring supervision/potential complications: HTN (monitor and provide prns in accordance with increased physical exertion and pain), CKD (avoid nephrotoxic meds), OA (ensure pain does not limit therapies), B-12 deficiency (cont supplementation), Tachycardia (monitor in accordance with pain and increasing activity), ABLA (transfuse if necessary to ensure appropriate perfusion for increased activity tolerance), left wrist pain (consider further workup), post-stroke dysphagia (advance diet as tolerated) 3. Due to safety, skin/wound care, disease management, pain management and patient education, does the patient require 24 hr/day rehab nursing? Yes 4. Does the patient require coordinated care of a physician, rehab nurse, PT (1-2 hrs/day, 5 days/week), OT (1-2 hrs/day, 5 days/week) and SLP (1-2 hrs/day, 5 days/week) to address physical and functional deficits in the context of the above medical diagnosis(es)? Yes Addressing deficits in the following areas: balance, endurance, locomotion, strength, transferring, bathing, dressing, toileting, cognition, swallowing and psychosocial support 5. Can the patient actively participate in an intensive therapy program of at least 3 hrs of therapy per day at least 5 days per week? Potentially 6. The potential for patient to make measurable gains while on inpatient rehab is excellent 7. Anticipated functional outcomes upon discharge from inpatient rehab are min assist  with PT, min assist with OT, modified independent and supervision with SLP. 8. Estimated rehab length of stay to reach the above  functional goals is: 13-18 days. 9. Anticipated D/C setting: Home 10. Anticipated post D/C treatments: HH therapy and Home excercise program 11. Overall Rehab/Functional Prognosis: good  RECOMMENDATIONS: This patient's condition is appropriate for continued rehabilitative care in the following setting: Will await PT eval as well as workup for left wrist pain.  Likely CIR. Patient has agreed to participate in recommended program. Yes Note that insurance prior authorization may be required for reimbursement for recommended care.  Comment: Rehab Admissions Coordinator to follow up.  Delice Lesch, MD, ABPMR [facility]ary [place], [place] 05/07/2017          Revision History                        Routing History

## 2017-05-09 NOTE — Discharge Summary (Signed)
Physician Discharge Summary  Theresa Hampton DVV:[id number] DOB: [birth year] DOA: 05/06/2017  PCP: Theresa Anger, MD  Admit date: 05/06/2017 Discharge date: 05/09/2017  Time spent: 45 minutes  Recommendations for Outpatient Follow-up:  Patient will be discharged to inpatient rehabilitation. Continue physical, occupational, and speech therapy.  Patient will need to follow up with primary care provider within one week of discharge.  Follow up with neurology in 6 weeks. Follow up with Patient should continue medications as prescribed.  Patient should follow a dysphagia II diet.   Discharge Diagnoses:  Acute/subacute CVA Essential hypertension Hyperlipidemia Anxiety Osteoporosis/Vitamin D deficiency  B12 deficiency Sacral fracture Left hand pain  Discharge Condition: Stable  Diet recommendation: dysphagia II  Filed Weights   05/07/17 0435 05/08/17 0500 05/09/17 0301  Weight: 57.4 kg (126 lb 8.7 oz) 57.2 kg (126 lb 1.7 oz) 60.1 kg (132 lb 7.9 oz)    History of present illness:  On 05/06/2017 by Ms. Theresa Butters, PA Theresa Hampton is a 83 y.o. female with history of hypertension, hyperlipidemia, history of PUD, vitamin D and B12 deficiency, osteoporosis, hard of hearing who was in his usual state of health until about 1 week ago, when she began having some difficulty walking, with increasing falls 2 days prior to admission.  She was last seen normal around 7 PM last evening.  This morning, when seen by her daughter, she noticed the patient to have left-sided facial droop, and brought her to the ED for further evaluation.  She also was noted to have mild dysarthria, very mild left hemiparesis on presentation.  She denies any dysphagia.  She denies any vision changes or headaches.  She denies any nausea, or vomiting.  She denies any shortness of breath, or chest pain.  She denies any abdominal pain.  No dysuria or gross hematuria, no bowel or urine incontinence.  She denies any  lower extremity swelling or calf pain.  She is not on aspirin on a daily basis, as she forgets to take them.  She denies any tobacco, alcohol, or recreational drug use.  She is not very active.  She denies any recent surgeries.  No bleeding issues are reported.  Of note, the patient is on hormonal patch to help her with her hot flashes.  She had been taking this medicine for many years.  Hospital Course:  Acute/subacute CVA -presented with recurrent falls, left facial droop -CT head: negative for acute intracranial abnormality -MRI brain: Subacute nonhemorrhagic linear infarct involving the right paramedian pons. -MRA head/neck: Mild stenosis at the origin of the carotid artery bilaterally. Moderate stenosis at the origin of right vertebral artery. Moderate stenosis left superior cerebellar artery. -LDL 133, hemoglobin A1c 5.3 -Echocardiogram EF55-60%, G1DD -Continue Plavix, statin (aspirin discontinued) -PT, OT recommended CIR -Speech consulted, recommending inpatient rehabilitation -Neurology consulted and appreciated, continue statin, plavix, with follow up in 6 weeks -Patient will be discharged to inpatient rehab  Essential hypertension -Allow for permissive hypertension given CVA -Currently on amlodipine  Hyperlipidemia -Continue statin  Anxiety -Continue Remeron, Klonopin as needed  Osteoporosis/Vitamin D deficiency  -continue supplementation  B12 deficiency -Continue supplementation   Sacral fracture -Likely secondary to fall -X-ray showed distal sacral fracture, uncertain age -As above, PT consulted -Continue pain control  Left hand pain -likely secondary to fall -left hand xray: showed wrist swelling, no fracture or dislocation  -Continue pain control -splint ordered- does not need to wear it 24 hours  Consultants Neurology Inpatient rehab  Procedures  Echocardiogram  Discharge  Exam: Vitals:   05/09/17 0558 05/09/17 0948  BP: (!) 143/65 130/60    Pulse: 89 97  Resp: 18 17  Temp: 98 F (36.7 C) 97.6 F (36.4 C)  SpO2: 97% 98%   Patient upset that she cannot move her left hand well. She feels she is "old" because she cannot feed herself. Denies chest pain, shortness of breath, abdominal pain, N/V/D/C.    General: Well developed, well nourished, NAD, appears stated age  60: , , mucous membranes moist.  Cardiovascular: S1 S2 auscultated, RRR, no murmur  Respiratory: Clear to auscultation bilaterally with equal chest rise  Abdomen: Soft, nontender, nondistended, + bowel sounds  Extremities: warm dry without cyanosis clubbing or edema of LE. Left hand edema.  Neuro: AAOx3, left sided facial droop, mild left sided weakness.   Psych: Depressed mood, however appropriate  Discharge Instructions Discharge Instructions    Ambulatory referral to Neurology   Complete by:  As directed    An appointment is requested in approximately: 6 weeks Follow up with stroke clinic Theresa Hampton preferred, if not available, then consider Theresa Hampton, [facility] or Theresa Hampton whoever is available) at [facility] in about 6-8 weeks. Thanks.   Discharge instructions   Complete by:  As directed    Patient will be discharged to inpatient rehabilitation. Continue physical, occupational, and speech therapy.  Patient will need to follow up with primary care provider within one week of discharge.  Follow up with neurology in 6 weeks. Follow up with Patient should continue medications as prescribed.  Patient should follow a dysphagia II diet.     Allergies as of 05/09/2017      Reactions   Lidocaine Hcl    seizure      Medication List    STOP taking these medications   aspirin EC 81 MG tablet   clobetasol cream 0.05 % Commonly known as:  TEMOVATE   metoprolol tartrate 25 MG tablet Commonly known as:  LOPRESSOR   ranitidine 150 MG tablet Commonly known as:  ZANTAC     TAKE these medications   amLODipine 2.5 MG tablet Commonly known as:   NORVASC TAKE 1 TABLET (2.5 MG TOTAL) BY MOUTH DAILY.   atorvastatin 40 MG tablet Commonly known as:  LIPITOR Take 1 tablet (40 mg total) by mouth daily at 6 PM.   Cholecalciferol 2000 units Tabs Take 2 each by mouth at bedtime.   clonazePAM 0.25 MG disintegrating tablet Commonly known as:  KLONOPIN DISSOLVE ONE TABLET BY MOUTH TWICE A DAY AS NEEDED FOR ANXIETY   clopidogrel 75 MG tablet Commonly known as:  PLAVIX Take 1 tablet (75 mg total) by mouth daily. Start taking on:  05/10/2017   cyanocobalamin 500 MCG tablet Take 500 mcg by mouth daily.   estradiol 0.05 mg/24hr patch Commonly known as:  CLIMARA - Dosed in mg/24 hr Place 1 patch (0.05 mg total) onto the skin once a week.   fluticasone 50 MCG/ACT nasal spray Commonly known as:  FLONASE Place 1 spray into the nose daily.   gabapentin 100 MG capsule Commonly known as:  NEURONTIN TAKE 1 CAPSULE (100 MG TOTAL) BY MOUTH AT BEDTIME.   loratadine 10 MG tablet Commonly known as:  CLARITIN Take 1 tablet (10 mg total) by mouth daily.   LUMIGAN 0.01 % Soln Generic drug:  bimatoprost instill 1 drop IN EACH EYE AT BEDTIME   mirtazapine 15 MG tablet Commonly known as:  REMERON TAKE ONE TABLET BY MOUTH EVERY EVENING BEFORE DINNER AT  4 - 5 PM   senna-docusate 8.6-50 MG tablet Commonly known as:  Senokot-S Take 1 tablet by mouth at bedtime as needed for moderate constipation.   traMADol 50 MG tablet Commonly known as:  ULTRAM TAKE 1 TABLET BY MOUTH EVERY 6 HOURS as needed for pain      Allergies  Allergen Reactions  . Lidocaine Hcl     seizure   Follow-up Information    Plotnikov, Evie Lacks, MD Follow up.   Specialty:  Internal Medicine Contact information: [place] [address] [phone]        Dennie Bible, NP. Schedule an appointment as soon as possible for a visit in 6 week(s).   Specialty:  Family Medicine Contact information: [address] [place]  [address] [phone]            The results of significant diagnostics from this hospitalization (including imaging, microbiology, ancillary and laboratory) are listed below for reference.    Significant Diagnostic Studies: Dg Sacrum/coccyx  Result Date: 05/06/2017 CLINICAL DATA:  Buttock pain EXAM: SACRUM AND COCCYX - 2+ VIEW COMPARISON:  None. FINDINGS: Pelvic ring is intact. Bilateral hip replacement is noted. Irregularity is noted in the distal sacrum consistent with prior fracture. This is of indeterminate age. No other focal abnormality is noted. Marland Kitchen IMPRESSION: Distal sacral fracture of uncertain age. Electronically Signed   By: Inez Catalina M.D.   On: 05/06/2017 12:43   Ct Head Wo Contrast  Result Date: 05/06/2017 CLINICAL DATA:  84 year old female with a history of fall and bruising on the forehead EXAM: CT HEAD WITHOUT CONTRAST CT MAXILLOFACIAL WITHOUT CONTRAST CT CERVICAL SPINE WITHOUT CONTRAST TECHNIQUE: Multidetector CT imaging of the head, cervical spine, and maxillofacial structures were performed using the standard protocol without intravenous contrast. Multiplanar CT image reconstructions of the cervical spine and maxillofacial structures were also generated. COMPARISON:  04/30/2007 FINDINGS: CT HEAD FINDINGS Brain: No acute intracranial hemorrhage. No midline shift or mass effect. Similar appearance of mild brain volume loss. Unremarkable configuration the ventricles. Gray-white differentiation maintained. Confluent hypodensity in the periventricular white matter, similar prior. Vascular: Calcifications of anterior circulation. Skull: No skull fracture. Soft tissue swelling in the left supraorbital region. Other: Moderate opacification of ethmoid air cells with minimal sphenoid sinus mucoperiosteal thickening. CT MAXILLOFACIAL FINDINGS Osseous: No fracture of the mid face. No mandible fracture. Bilateral condyles appear located. Limited evaluation at the dentition secondary to  hardware. Orbits: Bilateral lens extraction Sinuses: Moderate opacification of ethmoid air cells. Trace mucoperiosteal thickening of the bilateral maxillary sinuses and the sphenoid sinuses. Soft tissues: Soft tissue swelling in the left supraorbital region. CT CERVICAL SPINE FINDINGS Alignment: No subluxation of the cervical elements. Trace anterolisthesis of C3 on C4 and C4 on C5. Facets maintain alignment. Craniocervical junction unremarkable with no skullbase fracture. Skull base and vertebrae: No skullbase fracture with the craniocervical junction aligned. No fracture of the vertebral bodies identified. Vertebral body heights maintained. Soft tissues and spinal canal: No canal hematoma identified. No soft tissue swelling or asymmetry of the cervical soft tissues. Dense calcifications of the bilateral carotid. Disc levels: Multilevel degenerative disc disease of the cervical spine. Greatest degree of disc space narrowing at C5-C6 with uncovertebral joint disease and small posterior disc osteophyte complex. More moderate disc space narrowing at C6-C7 with uncovertebral joint disease. Mild facet hypertrophy present throughout the cervical spine which is most pronounced on the right at C3-C4 and C4-C5. On the left there is ankylosis of the  C2-C3 facet and hypertrophy at the C3-C4 and C4-C5 facet. The changes result in the greatest degree of foraminal narrowing on the left at C5-C6. No facet fracture. Upper chest: Upper chest not imaged Other: None IMPRESSION: Head CT: CT negative for acute intracranial abnormality. Evidence of chronic microvascular ischemic disease. Maxillofacial CT: No acute facial fracture. Soft tissue swelling in the left supraorbital scalp without underlying fracture. No radiopaque foreign body. Moderate paranasal sinus disease predominantly involving the ethmoid air cells. Cervical CT: CT negative for acute fracture malalignment of the cervical spine. Greatest degree of disc disease present at  C5-C6 with associated facet disease resulting in mild a moderate left-sided foraminal narrowing. Trace anterolisthesis of C3 on C4 and C4 on C5, likely degenerative. Carotid calcifications. Electronically Signed   By: Corrie Mckusick D.O.   On: 05/06/2017 10:30   Ct Cervical Spine Wo Contrast  Result Date: 05/06/2017 CLINICAL DATA:  83 year old female with a history of fall and bruising on the forehead EXAM: CT HEAD WITHOUT CONTRAST CT MAXILLOFACIAL WITHOUT CONTRAST CT CERVICAL SPINE WITHOUT CONTRAST TECHNIQUE: Multidetector CT imaging of the head, cervical spine, and maxillofacial structures were performed using the standard protocol without intravenous contrast. Multiplanar CT image reconstructions of the cervical spine and maxillofacial structures were also generated. COMPARISON:  04/30/2007 FINDINGS: CT HEAD FINDINGS Brain: No acute intracranial hemorrhage. No midline shift or mass effect. Similar appearance of mild brain volume loss. Unremarkable configuration the ventricles. Gray-white differentiation maintained. Confluent hypodensity in the periventricular white matter, similar prior. Vascular: Calcifications of anterior circulation. Skull: No skull fracture. Soft tissue swelling in the left supraorbital region. Other: Moderate opacification of ethmoid air cells with minimal sphenoid sinus mucoperiosteal thickening. CT MAXILLOFACIAL FINDINGS Osseous: No fracture of the mid face. No mandible fracture. Bilateral condyles appear located. Limited evaluation at the dentition secondary to hardware. Orbits: Bilateral lens extraction Sinuses: Moderate opacification of ethmoid air cells. Trace mucoperiosteal thickening of the bilateral maxillary sinuses and the sphenoid sinuses. Soft tissues: Soft tissue swelling in the left supraorbital region. CT CERVICAL SPINE FINDINGS Alignment: No subluxation of the cervical elements. Trace anterolisthesis of C3 on C4 and C4 on C5. Facets maintain alignment. Craniocervical  junction unremarkable with no skullbase fracture. Skull base and vertebrae: No skullbase fracture with the craniocervical junction aligned. No fracture of the vertebral bodies identified. Vertebral body heights maintained. Soft tissues and spinal canal: No canal hematoma identified. No soft tissue swelling or asymmetry of the cervical soft tissues. Dense calcifications of the bilateral carotid. Disc levels: Multilevel degenerative disc disease of the cervical spine. Greatest degree of disc space narrowing at C5-C6 with uncovertebral joint disease and small posterior disc osteophyte complex. More moderate disc space narrowing at C6-C7 with uncovertebral joint disease. Mild facet hypertrophy present throughout the cervical spine which is most pronounced on the right at C3-C4 and C4-C5. On the left there is ankylosis of the C2-C3 facet and hypertrophy at the C3-C4 and C4-C5 facet. The changes result in the greatest degree of foraminal narrowing on the left at C5-C6. No facet fracture. Upper chest: Upper chest not imaged Other: None IMPRESSION: Head CT: CT negative for acute intracranial abnormality. Evidence of chronic microvascular ischemic disease. Maxillofacial CT: No acute facial fracture. Soft tissue swelling in the left supraorbital scalp without underlying fracture. No radiopaque foreign body. Moderate paranasal sinus disease predominantly involving the ethmoid air cells. Cervical CT: CT negative for acute fracture malalignment of the cervical spine. Greatest degree of disc disease present at C5-C6 with associated facet disease  resulting in mild a moderate left-sided foraminal narrowing. Trace anterolisthesis of C3 on C4 and C4 on C5, likely degenerative. Carotid calcifications. Electronically Signed   By: Corrie Mckusick D.O.   On: 05/06/2017 10:30   [name] Neck W Wo Contrast  Result Date: 05/06/2017 CLINICAL DATA:  Stroke right pons EXAM: MRA NECK WITHOUT AND WITH CONTRAST MRA HEAD WITHOUT CONTRAST  TECHNIQUE: Multiplanar and multiecho pulse sequences of the neck were obtained without and with intravenous contrast. Angiographic images of the neck were obtained using MRA technique without and with intravenous contast.; Angiographic images of the Circle of Willis were obtained using MRA technique without intravenous contrast. CONTRAST:  42mL MULTIHANCE GADOBENATE DIMEGLUMINE 529 MG/ML IV SOLN COMPARISON:  MRI head 05/06/2017 FINDINGS: MRA NECK FINDINGS Normal aortic arch. Antegrade flow in the carotid and vertebral arteries bilaterally. Atherosclerotic disease and mild stenosis at the origin of the internal carotid artery bilaterally. Moderate stenosis at the origin of the right vertebral artery. Origin of the left vertebral arteries not included on the study and not evaluated. Both vertebral arteries are patent to the basilar without additional stenosis. MRA HEAD FINDINGS Both vertebral arteries widely patent to the basilar. PICA patent bilaterally. Basilar widely patent. Moderate stenosis left superior cerebellar artery. Right superior cerebellar arteries patent. Mild stenosis right posterior cerebral artery. Left posterior cerebral artery widely patent. Internal carotid artery widely patent bilaterally without stenosis. Anterior and middle cerebral arteries widely patent bilaterally without stenosis. Negative for cerebral aneurysm. IMPRESSION: Mild stenosis at the origin of the carotid artery bilaterally Moderate stenosis at the origin of the right vertebral artery. Origin of left vertebral not included on the study. Moderate stenosis left superior cerebellar artery. Mild stenosis right posterior cerebral artery. Basilar artery widely patent. Electronically Signed   By: Franchot Gallo M.D.   On: 05/06/2017 19:34   Mr Brain Wo Contrast  Result Date: 05/06/2017 CLINICAL DATA:  Focal neuro deficit, greater than 6 hours, stroke suspected. Left-sided weakness and facial droop. EXAM: MRI HEAD WITHOUT CONTRAST  TECHNIQUE: Multiplanar, multiecho pulse sequences of the brain and surrounding structures were obtained without intravenous contrast. COMPARISON:  CT head without contrast from the same day. FINDINGS: Brain: The diffusion-weighted images demonstrate a right paramedian pontine infarct measuring 14 mm cephalo caudad. No acute supratentorial infarct is present. Advanced atrophy and confluent diffuse white matter disease is present otherwise. There is a remote lacunar infarct involving the right lentiform nucleus. Ischemic changes are present in the thalami bilaterally. Mild white matter changes extend into the brainstem otherwise. T2 signal changes associated with the acute/ subacute infarct. The cerebellum is normal. Vascular: Flow is present in the major intracranial arteries. Skull and upper cervical spine: The skullbase is within normal limits. Midline sagittal structures are unremarkable. The craniocervical junction is within normal limits. Sinuses/Orbits: Diffuse opacification is present in the ethmoid air cells bilaterally. Right maxillary antrostomy is noted. There is mild mucosal thickening involving the maxillary sinuses bilaterally. Anterior sphenoid mucosal thickening is present. Mild mucosal thickening is present in the frontal sinuses bilaterally. There is some fluid in the mastoid air cells bilaterally. No obstructing nasopharyngeal lesion is present. IMPRESSION: 1. Acute/subacute nonhemorrhagic linear infarct involving the right paramedian pons, corresponding with the patient's left-sided weakness. 2. No acute supratentorial infarct. 3. Advanced atrophy and diffuse white matter disease likely reflects the sequela of chronic microvascular ischemia. 4. Moderate paranasal sinus disease, predominantly involving the ethmoids. Electronically Signed   By: San Morelle M.D.   On: 05/06/2017 12:34  Dg Hand Complete Left  Result Date: 05/07/2017 CLINICAL DATA:  Pain in the first metacarpal and  thumb. No known injury. EXAM: LEFT HAND - COMPLETE 3+ VIEW COMPARISON:  None. FINDINGS: The bones are demineralized. There is no evidence of acute fracture or dislocation. There are moderate degenerative changes throughout the interphalangeal joints, greatest at third PIP joint. There are moderate scaphotrapeziotrapezoidal degenerative changes. No erosive changes are identified. There is focal prominence of the soft tissues in the radial aspect of wrist. IMPRESSION: 1. Radial wrist soft tissue swelling/prominence. In the absence of trauma, this could reflect a ganglion or deQuervain's tenosynovitis. 2. 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 Electronically Signed   By: Richardean Sale M.D.   On: 05/07/2017 14:40   [name] Head Wo Contrast  Result Date: 05/06/2017 CLINICAL DATA:  Stroke right pons EXAM: MRA NECK WITHOUT AND WITH CONTRAST MRA HEAD WITHOUT CONTRAST TECHNIQUE: Multiplanar and multiecho pulse sequences of the neck were obtained without and with intravenous contrast. Angiographic images of the neck were obtained using MRA technique without and with intravenous contast.; Angiographic images of the Circle of Willis were obtained using MRA technique without intravenous contrast. CONTRAST:  81mL MULTIHANCE GADOBENATE DIMEGLUMINE 529 MG/ML IV SOLN COMPARISON:  MRI head 05/06/2017 FINDINGS: MRA NECK FINDINGS Normal aortic arch. Antegrade flow in the carotid and vertebral arteries bilaterally. Atherosclerotic disease and mild stenosis at the origin of the internal carotid artery bilaterally. Moderate stenosis at the origin of the right vertebral artery. Origin of the left vertebral arteries not included on the study and not evaluated. Both vertebral arteries are patent to the basilar without additional stenosis. MRA HEAD FINDINGS Both vertebral arteries widely patent to the basilar. PICA patent bilaterally. Basilar widely patent. Moderate stenosis left superior  cerebellar artery. Right superior cerebellar arteries patent. Mild stenosis right posterior cerebral artery. Left posterior cerebral artery widely patent. Internal carotid artery widely patent bilaterally without stenosis. Anterior and middle cerebral arteries widely patent bilaterally without stenosis. Negative for cerebral aneurysm. IMPRESSION: Mild stenosis at the origin of the carotid artery bilaterally Moderate stenosis at the origin of the right vertebral artery. Origin of left vertebral not included on the study. Moderate stenosis left superior cerebellar artery. Mild stenosis right posterior cerebral artery. Basilar artery widely patent. Electronically Signed   By: Franchot Gallo M.D.   On: 05/06/2017 19:34   Ct Maxillofacial Wo Contrast  Result Date: 05/06/2017 CLINICAL DATA:  84 year old female with a history of fall and bruising on the forehead EXAM: CT HEAD WITHOUT CONTRAST CT MAXILLOFACIAL WITHOUT CONTRAST CT CERVICAL SPINE WITHOUT CONTRAST TECHNIQUE: Multidetector CT imaging of the head, cervical spine, and maxillofacial structures were performed using the standard protocol without intravenous contrast. Multiplanar CT image reconstructions of the cervical spine and maxillofacial structures were also generated. COMPARISON:  04/30/2007 FINDINGS: CT HEAD FINDINGS Brain: No acute intracranial hemorrhage. No midline shift or mass effect. Similar appearance of mild brain volume loss. Unremarkable configuration the ventricles. Gray-white differentiation maintained. Confluent hypodensity in the periventricular white matter, similar prior. Vascular: Calcifications of anterior circulation. Skull: No skull fracture. Soft tissue swelling in the left supraorbital region. Other: Moderate opacification of ethmoid air cells with minimal sphenoid sinus mucoperiosteal thickening. CT MAXILLOFACIAL FINDINGS Osseous: No fracture of the mid face. No mandible fracture. Bilateral condyles appear located. Limited  evaluation at the dentition secondary to hardware. Orbits: Bilateral lens extraction Sinuses: Moderate opacification of ethmoid air cells. Trace mucoperiosteal thickening of the bilateral maxillary sinuses and the sphenoid sinuses. Soft tissues:  Soft tissue swelling in the left supraorbital region. CT CERVICAL SPINE FINDINGS Alignment: No subluxation of the cervical elements. Trace anterolisthesis of C3 on C4 and C4 on C5. Facets maintain alignment. Craniocervical junction unremarkable with no skullbase fracture. Skull base and vertebrae: No skullbase fracture with the craniocervical junction aligned. No fracture of the vertebral bodies identified. Vertebral body heights maintained. Soft tissues and spinal canal: No canal hematoma identified. No soft tissue swelling or asymmetry of the cervical soft tissues. Dense calcifications of the bilateral carotid. Disc levels: Multilevel degenerative disc disease of the cervical spine. Greatest degree of disc space narrowing at C5-C6 with uncovertebral joint disease and small posterior disc osteophyte complex. More moderate disc space narrowing at C6-C7 with uncovertebral joint disease. Mild facet hypertrophy present throughout the cervical spine which is most pronounced on the right at C3-C4 and C4-C5. On the left there is ankylosis of the C2-C3 facet and hypertrophy at the C3-C4 and C4-C5 facet. The changes result in the greatest degree of foraminal narrowing on the left at C5-C6. No facet fracture. Upper chest: Upper chest not imaged Other: None IMPRESSION: Head CT: CT negative for acute intracranial abnormality. Evidence of chronic microvascular ischemic disease. Maxillofacial CT: No acute facial fracture. Soft tissue swelling in the left supraorbital scalp without underlying fracture. No radiopaque foreign body. Moderate paranasal sinus disease predominantly involving the ethmoid air cells. Cervical CT: CT negative for acute fracture malalignment of the cervical spine.  Greatest degree of disc disease present at C5-C6 with associated facet disease resulting in mild a moderate left-sided foraminal narrowing. Trace anterolisthesis of C3 on C4 and C4 on C5, likely degenerative. Carotid calcifications. Electronically Signed   By: Corrie Mckusick D.O.   On: 05/06/2017 10:30    Microbiology: No results found for this or any previous visit (from the past 240 hour(s)).   Labs: Basic Metabolic Panel: Recent Labs  Lab 05/06/17 0857 05/06/17 0916 05/07/17 0330  NA 140 143 140  K 3.5 3.6 3.5  CL 104 106 108  CO2 25  --  22  GLUCOSE 111* 108* 87  BUN 10 10 9   CREATININE 0.79 0.60 0.78  CALCIUM 9.2  --  8.5*   Liver Function Tests: Recent Labs  Lab 05/06/17 0857 05/07/17 0330  AST 19 15  ALT 14 11*  ALKPHOS 80 71  BILITOT 0.9 0.9  PROT 7.5 6.3*  ALBUMIN 3.5 3.0*   No results for input(s): LIPASE, AMYLASE in the last 168 hours. No results for input(s): AMMONIA in the last 168 hours. CBC: Recent Labs  Lab 05/06/17 0857 05/06/17 0916 05/07/17 0330  WBC 9.1  --  8.2  NEUTROABS 7.1  --   --   HGB 12.2 12.2 11.3*  HCT 36.7 36.0 34.8*  MCV 87.8  --  88.3  PLT 257  --  231   Cardiac Enzymes: Recent Labs  Lab 05/06/17 1616  TROPONINI <0.03   BNP: BNP (last 3 results) No results for input(s): BNP in the last 8760 hours.  ProBNP (last 3 results) No results for input(s): PROBNP in the last 8760 hours.  CBG: No results for input(s): GLUCAP in the last 168 hours.     Signed:  Cristal Ford  [facility] 05/09/2017, 11:54 AM

## 2017-05-09 NOTE — H&P (Signed)
Physical Medicine and Rehabilitation Admission H&P    Chief Complaint  Patient presents with  . Stroke with left sided weakness.    HPI:   Theresa Hampton is a 84 y.o. left handed female with history of HTN, CKD, OA, B-12 deficiency, one week history of difficulty walking progressing to increased falls. History taken from chart review and patient.  She was admitted on 05/06/17 AM after found by family with left  facial droop. MRI brain reviewed, showing right pontine CVA.  Per report MRI/MRA right pontine stroke with moderate stenosis at origin of R-VA and moderate stenosis L-SCA. Neurology recommended changing ASA to Plavix for stroke likely due to SVD. 2 D echo showed EF 55-60% with no wall abnormality, mild aortic stenosis with moderately thickened valve and moderate TVR.  She has had reports of buttock pain as well as left wrist pain since admission. X rays of pelvis showed evidence of prior sacral fracture and left wrist films showed moderate degenerative changes with soft tissue swelling distal wrist.  Speech therapy evaluation revealed deficits in awareness, problem solving and attention. CIR recommended due to functional deficits.    Review of Systems  Constitutional: Negative for chills and fever.  HENT: Positive for hearing loss. Negative for tinnitus.   Eyes: Negative for blurred vision and double vision.  Respiratory: Negative for cough and hemoptysis.   Cardiovascular: Negative for chest pain and palpitations.  Gastrointestinal: Positive for constipation (no BM since admission). Negative for heartburn and nausea.  Genitourinary: Negative for dysuria and urgency.  Musculoskeletal: Positive for back pain (left wrist, low back and buttock pain since fall) and myalgias.  Skin: Negative for rash.  Neurological: Positive for sensory change, speech change, focal weakness and weakness. Negative for dizziness and headaches.  Psychiatric/Behavioral: Positive for memory loss. The  patient is nervous/anxious. The patient does not have insomnia.        Feels like she needs a happy pill  All other systems reviewed and are negative.     Past Medical History:  Diagnosis Date  . Allergic rhinitis   . Arthritis    "hips" (05/08/2017)  . B12 deficiency anemia   . Chronic sinusitis   . Daily headache    "q am" (05/08/2017)  . History of transfusion 2004   "w/1st hip OR" (05/08/2017)  . Hyperlipidemia   . Hypertension   . Iron deficiency anemia   . Leg cramps   . Osteoarthritis    Dr. Maureen Ralphs  . Osteoporosis   . Peptic ulcer disease 2003  . PONV (postoperative nausea and vomiting)   . Pyelonephritis, acute 2014  . Seasonal allergies   . Seizures ([facility])    x1 with Lidocaine  . Stroke ([place]) 05/05/2017  . Vitamin B12 deficiency   . Vitamin D deficiency     Past Surgical History:  Procedure Laterality Date  . CATARACT EXTRACTION, BILATERAL Bilateral   . DILATION AND CURETTAGE OF UTERUS    . ENDOMETRIAL ABLATION  2008  . JOINT REPLACEMENT    . THYROIDECTOMY, PARTIAL  "many years ago"  . TOTAL HIP ARTHROPLASTY Left 01/06/2015   Procedure: LEFT TOTAL HIP ARTHROPLASTY ANTERIOR APPROACH;  Surgeon: Gaynelle Arabian, MD;  Location: [facility];  Service: Orthopedics;  Laterality: Left;  . TOTAL HIP ARTHROPLASTY Right 2004    Family History  Problem Relation Age of Onset  . Arthritis Mother   . Hypertension Unknown   . Cancer Sister     Social History: Lives alone and independent  with AD---uses walker when tired. Has an aide who comes in few hours during the week. She  reports that  has never smoked. she has never used smokeless tobacco. She reports that she does not drink alcohol or use drugs.    Allergies  Allergen Reactions  . Lidocaine Hcl     seizure    Medications Prior to Admission  Medication Sig Dispense Refill  . amLODipine (NORVASC) 2.5 MG tablet TAKE 1 TABLET (2.5 MG TOTAL) BY MOUTH DAILY. 90 tablet 1  . aspirin EC 81 MG tablet Take 81 mg by mouth daily.     . Cholecalciferol 2000 UNITS TABS Take 2 each by mouth at bedtime.    . clonazePAM (KLONOPIN) 0.25 MG disintegrating tablet DISSOLVE ONE TABLET BY MOUTH TWICE A DAY AS NEEDED FOR ANXIETY 60 tablet 2  . cyanocobalamin 500 MCG tablet Take 500 mcg by mouth daily.    Marland Kitchen estradiol (CLIMARA - DOSED IN MG/24 HR) 0.05 mg/24hr patch Place 1 patch (0.05 mg total) onto the skin once a week. 12 patch 1  . fluticasone (FLONASE) 50 MCG/ACT nasal spray Place 1 spray into the nose daily. 16 g 3  . gabapentin (NEURONTIN) 100 MG capsule TAKE 1 CAPSULE (100 MG TOTAL) BY MOUTH AT BEDTIME. 90 capsule 1  . loratadine (CLARITIN) 10 MG tablet Take 1 tablet (10 mg total) by mouth daily. 100 tablet 2  . LUMIGAN 0.01 % SOLN instill 1 drop IN EACH EYE AT BEDTIME  3  . mirtazapine (REMERON) 15 MG tablet TAKE ONE TABLET BY MOUTH EVERY EVENING BEFORE DINNER AT 4 - 5 PM 90 tablet 1  . traMADol (ULTRAM) 50 MG tablet TAKE 1 TABLET BY MOUTH EVERY 6 HOURS as needed for pain 60 tablet 5  . clobetasol cream (TEMOVATE) 8.46 % Apply 1 application topically 2 (two) times daily. Use sparingly. (Patient not taking: Reported on 05/06/2017) 30 g 2  . metoprolol tartrate (LOPRESSOR) 25 MG tablet Take 1 tablet (25 mg total) by mouth 2 (two) times daily. (Patient not taking: Reported on 05/06/2017) 180 tablet 3  . ranitidine (ZANTAC) 150 MG tablet Take 1 tablet (150 mg total) by mouth at bedtime. (Patient not taking: Reported on 05/06/2017) 30 tablet 5    Drug Regimen Review  Drug regimen was reviewed and remains appropriate with no significant issues identified  Home: Home Living Family/patient expects to be discharged to:: Private residence Living Arrangements: Alone Available Help at Discharge: (Sra aide comes 3 times per week) Type of Home: House Home Access: Stairs to enter [employer] of Steps: 2 Home Layout: Two level, Able to live on main level with bedroom/bathroom Bathroom Shower/Tub: Print production planner: Handicapped height Bathroom Accessibility: Yes Home Equipment: Environmental consultant - 2 wheels, Walker - 4 wheels, [place] - single point, Grab bars - tub/shower, Civil engineer, contracting  Lives With: Alone   Functional History: Prior Function Level of Independence: Independent Comments: Pt ambulated without an AD unless she was fatigued or late at night to walk to the bathroom for safety. independently completed ADL tasks.   Functional Status:  Mobility: Bed Mobility Overal bed mobility: Needs Assistance Bed Mobility: Rolling, Sidelying to Sit Rolling: Min assist Sidelying to sit: HOB elevated, Min assist Supine to sit: Max assist Sit to supine: Mod assist General bed mobility comments: pt somewhat confused by task of sitting up once in SL. Min instructional cues as well as as min A to elevate trunk away from bed.  Transfers Overall transfer level: Needs  assistance Equipment used: 2 person hand held assist Transfers: Sit to/from Stand, Technical brewer Transfers Sit to Stand: +2 safety/equipment, Max assist Stand pivot transfers: Max assist, +2 physical assistance, Mod assist General transfer comment: Pt needed max A for power up first time standing. Was able to stand with mod A after 2 trials. Pivoted to [facility] and then to recliner after using BSC, max A +2 for pivot each time with L knee buckling but pt able to step LLE and put some wt on that side. Pt limited by light headedness once she got up.  Ambulation/Gait Ambulation/Gait assistance: Mod assist, +2 physical assistance Ambulation Distance (Feet): 15 Feet Assistive device: 2 person hand held assist Gait Pattern/deviations: Step-to pattern, Decreased step length - left, Decreased stance time - right, Narrow base of support, Decreased weight shift to left, Trunk flexed General Gait Details: unable to safely attempt due to light headedness Gait velocity: decreased    ADL: ADL Overall ADL's : Needs assistance/impaired Eating/Feeding: Minimal assistance,  Sitting(modified diet) Grooming: Moderate assistance, Sitting Upper Body Bathing: Sitting, Moderate assistance Lower Body Bathing: Maximal assistance, Sit to/from stand Upper Body Dressing : Maximal assistance, Sitting Lower Body Dressing: Maximal assistance, Sit to/from stand Toilet Transfer: Maximal assistance, Stand-pivot Toileting- Clothing Manipulation and Hygiene: Maximal assistance Functional mobility during ADLs: Maximal assistance  Cognition: Cognition Overall Cognitive Status: Impaired/Different from baseline Arousal/Alertness: Awake/alert Orientation Level: Oriented X4 Attention: Sustained, Selective Sustained Attention: Appears intact Selective Attention: Impaired Selective Attention Impairment: Functional basic, Verbal basic Awareness: Impaired Awareness Impairment: Emergent impairment Problem Solving: Impaired Problem Solving Impairment: Functional basic Cognition Arousal/Alertness: Awake/alert Behavior During Therapy: WFL for tasks assessed/performed(tearful) Overall Cognitive Status: Impaired/Different from baseline Area of Impairment: Problem solving Current Attention Level: Sustained Safety/Judgement: Decreased awareness of safety Awareness: Emergent Problem Solving: Slow processing, Requires verbal cues General Comments: Pt with appropriate responses. Pt requiring verbal cues for bed mobility and ambulation. Pt with periods of decreased verbalization and staring though would respond when spoken to. This was combined with light headedness though daughter reports that she had periods like this at home PTA   Blood pressure 130/60, pulse 97, temperature 97.6 F (36.4 C), temperature source Oral, resp. rate 17, height 5\' 5"  (1.651 m), weight 60.1 kg (132 lb 7.9 oz), SpO2 98 %. Physical Exam  Nursing note and vitals reviewed. Constitutional: She is oriented to person, place, and time. She appears well-developed and well-nourished. No distress.  HENT:  Head:  Normocephalic and atraumatic.  Mouth/Throat: Oropharynx is clear and moist.  Left forehead and periorbital ecchymosis resolving--edema resolved.    Eyes: Conjunctivae and EOM are normal. Pupils are equal, round, and reactive to light.  Neck: Normal range of motion. Neck supple.  Cardiovascular: Normal rate. Frequent extrasystoles are present.  Respiratory: Effort normal and breath sounds normal. No stridor. No respiratory distress.  GI: Soft. Bowel sounds are normal. There is no tenderness.  Musculoskeletal: She exhibits edema and tenderness (left wrist tenderness with ROM and medially with palpation. ).  Neurological: She is alert and oriented to person, place, and time. A cranial nerve deficit is present.  Left facial weakness with dysarthria.  Mild left inattention with decreased postural reflexes--tends to lean to the right.  She was able to follow simple one and two step commands without difficulty.  Motor: RUE/RLE: 4/5 proximal to distal LUE; 3/5 proximal to distal (pain inhibition) LLE: 3-/5 proximal to distal  Skin: Skin is warm and dry. She is not diaphoretic.  Psychiatric: She has a normal mood and  affect. Her behavior is normal. Thought content normal.    No results found for this or any previous visit (from the past 48 hour(s)). Dg Hand Complete Left  Result Date: 05/07/2017 CLINICAL DATA:  Pain in the first metacarpal and thumb. No known injury. EXAM: LEFT HAND - COMPLETE 3+ VIEW COMPARISON:  None. FINDINGS: The bones are demineralized. There is no evidence of acute fracture or dislocation. There are moderate degenerative changes throughout the interphalangeal joints, greatest at third PIP joint. There are moderate scaphotrapeziotrapezoidal degenerative changes. No erosive changes are identified. There is focal prominence of the soft tissues in the radial aspect of wrist. IMPRESSION: 1. Radial wrist soft tissue swelling/prominence. In the absence of trauma, this could reflect a  ganglion or deQuervain's tenosynovitis. 2. 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 Electronically Signed   By: Richardean Sale M.D.   On: 05/07/2017 14:40       Medical Problem List and Plan: 1.  Deficits with mobility, self-care, problem solving secondary to right pontine stroke 2.  DVT Prophylaxis/Anticoagulation: Pharmaceutical: Lovenox 3. Pain Management: Ice to left wrist and lower back. Continue Voltaren gel to help with myalgias. Encourage use of left wrist splint.  4. Mood: Team to provide ego support. Continue Remeron with prn Klonopin.   LCSW to follow for evaluation and support.  5. Neuropsych: This patient is capable of making decisions on her own behalf. 6. Skin/Wound Care: Routine pressure relief measures. Maintain adequate nutritional and hydration status.  7. Fluids/Electrolytes/Nutrition: Monitor I/O. Check lytes in am.  8. B 12 deficiency: Continue Supplement 9. HTN: Monitor BP bid and monitor for orthostatic changes. Will  continue Norvasc for now. Monitor for  10. H/o of Iron deficiency anemia: Mild drop noted--add multivitamin with iron. Will recheck CBC in am.  11. Dyslipidemia: On Lipitor     Post Admission Physician Evaluation: 1. Preadmission assessment reviewed and changes made below. 2. Functional deficits secondary  to right pontine stroke. 3. Patient is admitted to receive collaborative, interdisciplinary care between the physiatrist, rehab nursing staff, and therapy team. 4. Patient's level of medical complexity and substantial therapy needs in context of that medical necessity cannot be provided at a lesser intensity of care such as a SNF. 5. Patient has experienced substantial functional loss from his/her baseline which was documented above under the "Functional History" and "Functional Status" headings.  Judging by the patient's diagnosis, physical exam, and functional history, the patient has potential for  functional progress which will result in measurable gains while on inpatient rehab.  These gains will be of substantial and practical use upon discharge  in facilitating mobility and self-care at the household level. 55. Physiatrist will provide 24 hour management of medical needs as well as oversight of the therapy plan/treatment and provide guidance as appropriate regarding the interaction of the two. 7. 24 hour rehab nursing will assist with bladder management, bowel management, safety, skin/wound care, disease management, medication administration, pain management and patient education  and help integrate therapy concepts, techniques,education, etc. 8. PT will assess and treat for/with: Lower extremity strength, range of motion, stamina, balance, functional mobility, safety, adaptive techniques and equipment, woundcare, coping skills, pain control, stroke education.   Goals are: Min A/Supervision. 9. OT will assess and treat for/with: ADL's, functional mobility, safety, upper extremity strength, adaptive techniques and equipment, wound mgt, ego support, and community reintegration.   Goals are: Min A/Supervision. Therapy may proceed with showering this patient. 10. SLP will assess and treat for/with: speech, language, swallowing,  cognition.  Goals are: Supervision/Min A. 11. Case Management and Social Worker will assess and treat for psychological issues and discharge planning. 12. Team conference will be held weekly to assess progress toward goals and to determine barriers to discharge. 13. Patient will receive at least 3 hours of therapy per day at least 5 days per week. 14. ELOS: 20-25 days.       15. Prognosis:  good  Delice Lesch, MD, ABPMR [facility] [place], [place] 05/09/2017

## 2017-05-09 NOTE — Progress Notes (Signed)
Theresa Diones, RN  Rehab Admission Coordinator  Physical Medicine and Rehabilitation  PMR Pre-admission  Signed  Date of Service:  05/07/2017 4:00 PM       Related encounter: ED to Hosp-Admission (Current) from 05/06/2017 in [facility]      Signed            [] Hide copied text  [] Hover for details   PMR Admission Coordinator Pre-Admission Assessment  Patient: Theresa Hampton is an 84 y.o., female MRN: [id number] DOB: 1920/01/28 Height: 5\' 5"  (165.1 cm) Weight: 60.1 kg (132 lb 7.9 oz)                                                                                                                                              Insurance Information HMO:     PPO:      PCP:      IPA:      80/20: yes     OTHER: no HMO PRIMARY: Medicare a and b      Policy#: [id number]      Subscriber: pt Benefits:  Phone #: passport one online     Name: 05/07/2017 Eff. Date: 04/08/1987     Deduct: $1364      Out of Pocket Max: none      Life Max: none CIR: 100%      SNF: 20 full days Outpatient: 80%     Co-Pay: 20% Home Health: 100%      Co-Pay: none DME: 80%     Co-Pay: 20% Providers: pt choice  SECONDARY: [place]      Policy#: [id number]      Subscriber: pt  Medicaid Application Date:       Case Manager:  Disability Application Date:       Case Worker:   Emergency Contact Information        Contact Information    Name Relation Home Work [place] Daughter [phone]  [phone]   Schuyler Amor Daughter [phone]  [phone]     Current Medical History  Patient Admitting Diagnosis:  Right pontine CVA  History of Present Illness:  FVC:BSWH C Hutchisonis a 84 y.o.femalewith history of HTN, CKD, OA, B-12 deficiency, one week history of difficulty walking progressing to increased falls.History taken from chart review and patient.She was admitted on 12/30/18AMafter found by family with left facial droop. MRI brain  reviewed, showing right pontine CVA. Per report MRI/MRAright pontine stroke with moderate stenosis at origin of R-VA and moderate stenosis L-SCA. Neurology recommended changing ASA to Plavix for stroke likely due to SVD. Work up and therapy evaluations underway.Speech therapy evaluation revealeddeficits in awareness, problem solving and attention.  She was independent with ADLs and mobility PTA and used walker when tired. Baked a cake for the holidays. Has had an aide (since [facility] 2016) who comes in for a  few hours daily?  Total: 6 NIHSS  Past Medical History      Past Medical History:  Diagnosis Date  . Allergic rhinitis   . Arthritis    "hips" (05/08/2017)  . B12 deficiency anemia   . Chronic sinusitis   . Daily headache    "q am" (05/08/2017)  . History of transfusion 2004   "w/1st hip OR" (05/08/2017)  . Hyperlipidemia   . Hypertension   . Iron deficiency anemia   . Leg cramps   . Osteoarthritis    Dr. Maureen Ralphs  . Osteoporosis   . Peptic ulcer disease 2003  . PONV (postoperative nausea and vomiting)   . Pyelonephritis, acute 2014  . Seasonal allergies   . Seizures (HCC)    x1 with Lidocaine  . Stroke ([place]) 05/05/2017  . Vitamin B12 deficiency   . Vitamin D deficiency     Family History  family history includes Arthritis in her mother; Cancer in her sister; Hypertension in her unknown relative.  Prior Rehab/Hospitalizations:  Has the patient had major surgery during 100 days prior to admission? No  Current Medications   Current Facility-Administered Medications:  .  amLODipine (NORVASC) tablet 2.5 mg, 2.5 mg, Oral, Daily, Wertman, Sara E, PA-C, 2.5 mg at 05/09/17 1008 .  atorvastatin (LIPITOR) tablet 40 mg, 40 mg, Oral, q1800, Costello, Mary A, NP, 40 mg at 05/08/17 1828 .  cholecalciferol (VITAMIN D) tablet 2,000 Units, 2,000 Units, Oral, QHS, Rondel Jumbo, PA-C, 2,000 Units at 05/08/17 2148 .  clonazePAM (KLONOPIN) disintegrating tablet  0.25 mg, 0.25 mg, Oral, BID PRN, Rondel Jumbo, PA-C, 0.25 mg at 05/09/17 3419 .  clopidogrel (PLAVIX) tablet 75 mg, 75 mg, Oral, Daily, Rondel Jumbo, PA-C, 75 mg at 05/09/17 1008 .  cyanocobalamin tablet 500 mcg, 500 mcg, Oral, Daily, Rondel Jumbo, PA-C, 500 mcg at 05/09/17 1008 .  diclofenac sodium (VOLTAREN) 1 % transdermal gel 2 g, 2 g, Topical, QID, Mikhail, Sumner, DO, 2 g at 05/08/17 2149 .  gabapentin (NEURONTIN) capsule 100 mg, 100 mg, Oral, QHS, Rondel Jumbo, PA-C, 100 mg at 05/08/17 2148 .  heparin injection 5,000 Units, 5,000 Units, Subcutaneous, Q8H, Rondel Jumbo, PA-C, 5,000 Units at 05/09/17 0547 .  hydrALAZINE (APRESOLINE) injection 5-10 mg, 5-10 mg, Intravenous, Q8H PRN, Rondel Jumbo, PA-C .  HYDROcodone-acetaminophen (NORCO) 7.5-325 MG per tablet 1 tablet, 1 tablet, Oral, Q4H PRN, Cristal Ford, DO, 1 tablet at 05/09/17 1008 .  mirtazapine (REMERON) tablet 15 mg, 15 mg, Oral, QHS, Rondel Jumbo, PA-C, 15 mg at 05/08/17 2148 .  senna-docusate (Senokot-S) tablet 1 tablet, 1 tablet, Oral, QHS PRN, Rondel Jumbo, PA-C  Patients Current Diet: DIET DYS 2 Room service appropriate? Yes; Fluid consistency: Thin  Precautions / Restrictions Precautions Precautions: Fall Precaution Comments: L painful wrist Restrictions Weight Bearing Restrictions: No Other Position/Activity Restrictions: maintained NWB rhrough L hand due to pain  Has the patient had 2 or more falls or a fall with injury in the past year?No  Prior Activity Level Limited Community (1-2x/wk): only used RW when fatigued or at night; does not drive  Development worker, international aid / Equipment Home Assistive Devices/Equipment: Eyeglasses, Dentures (specify type), Environmental consultant (specify type), Bedside commode/3-in-1, Hand-held shower hose, Shower chair without back, Hearing aid Home Equipment: Environmental consultant - 2 wheels, Walker - 4 wheels, [place] - single point, Grab bars - tub/shower, Shower seat  Prior Device Use:  Indicate devices/aids used by the patient prior to current illness, exacerbation or  injury? Walker  Prior Functional Level Prior Function Level of Independence: Independent Comments: Pt ambulated without an AD unless she was fatigued or late at night to walk to the bathroom for safety. independently completed ADL tasks.   Self Care: Did the patient need help bathing, dressing, using the toilet or eating?  Independent  Indoor Mobility: Did the patient need assistance with walking from room to room (with or without device)? Independent  Stairs: Did the patient need assistance with internal or external stairs (with or without device)? Independent  Functional Cognition: Did the patient need help planning regular tasks such as shopping or remembering to take medications? Independent  Current Functional Level Cognition  Arousal/Alertness: Awake/alert Overall Cognitive Status: Impaired/Different from baseline Current Attention Level: Sustained Orientation Level: Oriented X4 Safety/Judgement: Decreased awareness of safety General Comments: Pt with appropriate responses. Pt requiring verbal cues for bed mobility and ambulation. Pt with periods of decreased verbalization and staring though would respond when spoken to. This was combined with light headedness though daughter reports that she had periods like this at home PTA Attention: Sustained, Selective Sustained Attention: Appears intact Selective Attention: Impaired Selective Attention Impairment: Functional basic, Verbal basic Awareness: Impaired Awareness Impairment: Emergent impairment Problem Solving: Impaired Problem Solving Impairment: Functional basic    Extremity Assessment (includes Sensation/Coordination)  Upper Extremity Assessment: LUE deficits/detail, Defer to OT evaluation LUE Deficits / Details: able to lift and hold LUE against gravity to ~ 90 degrees shoulder flexion. decreased grasp at left hand.  LUE  Coordination: decreased fine motor, decreased gross motor(difficulty using functionally at this time)  Lower Extremity Assessment: LLE deficits/detail LLE Deficits / Details: LLE 4-/5 generally.     ADLs  Overall ADL's : Needs assistance/impaired Eating/Feeding: Minimal assistance, Sitting(modified diet) Grooming: Moderate assistance, Sitting Upper Body Bathing: Sitting, Moderate assistance Lower Body Bathing: Maximal assistance, Sit to/from stand Upper Body Dressing : Maximal assistance, Sitting Lower Body Dressing: Maximal assistance, Sit to/from stand Toilet Transfer: Maximal assistance, Stand-pivot Toileting- Clothing Manipulation and Hygiene: Maximal assistance Functional mobility during ADLs: Maximal assistance    Mobility  Overal bed mobility: Needs Assistance Bed Mobility: Rolling, Sidelying to Sit Rolling: Min assist Sidelying to sit: HOB elevated, Min assist Supine to sit: Max assist Sit to supine: Mod assist General bed mobility comments: pt somewhat confused by task of sitting up once in SL. Min instructional cues as well as as min A to elevate trunk away from bed.     Transfers  Overall transfer level: Needs assistance Equipment used: 2 person hand held assist Transfers: Sit to/from Stand, Stand Pivot Transfers Sit to Stand: +2 safety/equipment, Max assist Stand pivot transfers: Max assist, +2 physical assistance, Mod assist General transfer comment: Pt needed max A for power up first time standing. Was able to stand with mod A after 2 trials. Pivoted to [facility] and then to recliner after using BSC, max A +2 for pivot each time with L knee buckling but pt able to step LLE and put some wt on that side. Pt limited by light headedness once she got up.     Ambulation / Gait / Stairs / Wheelchair Mobility  Ambulation/Gait Ambulation/Gait assistance: Mod assist, +2 physical assistance Ambulation Distance (Feet): 15 Feet Assistive device: 2 person hand held assist Gait  Pattern/deviations: Step-to pattern, Decreased step length - left, Decreased stance time - right, Narrow base of support, Decreased weight shift to left, Trunk flexed General Gait Details: unable to safely attempt due to light headedness Gait velocity: decreased  Posture / Balance Dynamic Sitting Balance Sitting balance - Comments: pt able to sit EOB without UE support, however requires help with weight-shifting and scooting to EOB.  Balance Overall balance assessment: Needs assistance Sitting-balance support: Feet supported, No upper extremity supported Sitting balance-Leahy Scale: Fair Sitting balance - Comments: pt able to sit EOB without UE support, however requires help with weight-shifting and scooting to EOB.  Standing balance support: Bilateral upper extremity supported, During functional activity Standing balance-Leahy Scale: Poor Standing balance comment: Pt requiring mod/ max assist to maintain standing balance.     Special needs/care consideration BiPAP/CPAP  N/a CPM  N/a Continuous Drip IV  N/a Dialysis  N/a Life Vest  N/a Oxygen  N/a Special Bed  N/a Trach Size n/a Wound Vac n/a Skin ecchymosis left eye; painful wrist form fall pta                               Bowel mgmt: Last BM 05/07/17 Bladder mgmt: Incontinence, using external catheter Diabetic mgmt  N/a   Previous Home Environment Living Arrangements: Alone  Lives With: Alone Available Help at Discharge: (Sra aide comes 3 times per week) Type of Home: House Home Layout: Two level, Able to live on main level with bedroom/bathroom Home Access: Stairs to enter [employer] of Steps: 2 Bathroom Shower/Tub: Chiropodist: Handicapped height Bathroom Accessibility: Yes How Accessible: Accessible via walker [place]: Yes Type of Home Care Services: Homehealth aide, Housekeeping, [place] (if known): private pay and family  Discharge Living  Setting Plans for Discharge Living Setting: Patient's home, Alone Type of Home at Discharge: House Discharge Home Layout: Two level, Able to live on main level with bedroom/bathroom Discharge Home Access: Stairs to enter Entrance Stairs-Rails: None Entrance Stairs-Number of Steps: 2 Discharge Bathroom Shower/Tub: Tub/shower unit Discharge Bathroom Toilet: Handicapped height Discharge Bathroom Accessibility: Yes How Accessible: Accessible via walker Does the patient have any problems obtaining your medications?: No  Social/Family/Support Systems Patient Roles: Parent Contact Information: daughter, Lelon Frohlich Anticipated Caregiver: daughters Lelon Frohlich and Zigmund Daniel as well as aide, Clarise Cruz Anticipated Ambulance person Information: see above Ability/Limitations of Caregiver: Zigmund Daniel can provide supervision only; Clarise Cruz aide may be able to increase hrs Caregiver Availability: Other (Comment)(daughters Lelon Frohlich and Zigmund Daniel to discuss 24/7 assist with aide and t) Discharge Plan Discussed with Primary Caregiver: Yes Is Caregiver In Agreement with Plan?: Yes Does Caregiver/Family have Issues with Lodging/Transportation while Pt is in Rehab?: No   Daughter, Lelon Frohlich, to discuss with Clarise Cruz, caregiver pta, if she can increase her hours more. Another daughter, Zigmund Daniel can provide supervision.   Goals/Additional Needs Patient/Family Goal for Rehab: min assist PT and OT, Mod I to supervision with SLP Expected length of stay: [facility] 10- 14 days Pt/Family Agrees to Admission and willing to participate: Yes Program Orientation Provided & Reviewed with Pt/Caregiver Including Roles  & Responsibilities: Yes  Decrease burden of Care through IP rehab admission: n/a  Possible need for SNF placement upon discharge: if pt does not progress to supervision min assist level, pt may need short term SNF. Ann, daughter, may be able to increase hours for caregiver support at home during the day with she and her sister filling in for the night  shift  Patient Condition: This patient's medical and functional status has changed since the consult dated: 05/07/2017 in which the Rehabilitation Physician determined and documented that the patient's condition is appropriate for intensive rehabilitative  care in an inpatient rehabilitation facility. See "History of Present Illness" (above) for medical update. Functional changes are: Currently requiring max assist +2 for transfers. Patient's medical and functional status update has been discussed with the Rehabilitation physician and patient remains appropriate for inpatient rehabilitation. Will admit to inpatient rehab today.  Preadmission Screen Completed By:  Theresa Hampton, 05/09/2017 11:52 AM ______________________________________________________________________   Discussed status with Dr. Posey Pronto on 05/09/17 at 1152 and received telephone approval for admission today.  Admission Coordinator:  Theresa Hampton, time 1152/Date 05/09/17             Cosigned by: Jamse Arn, MD at 05/09/2017 11:59 AM  Revision History

## 2017-05-10 ENCOUNTER — Inpatient Hospital Stay (HOSPITAL_COMMUNITY): Payer: Medicare Other | Admitting: Physical Therapy

## 2017-05-10 ENCOUNTER — Encounter (HOSPITAL_COMMUNITY): Payer: Self-pay | Admitting: *Deleted

## 2017-05-10 ENCOUNTER — Inpatient Hospital Stay (HOSPITAL_COMMUNITY): Payer: Medicare Other | Admitting: Occupational Therapy

## 2017-05-10 ENCOUNTER — Inpatient Hospital Stay (HOSPITAL_COMMUNITY): Payer: Medicare Other | Admitting: Speech Pathology

## 2017-05-10 ENCOUNTER — Telehealth: Payer: Self-pay | Admitting: *Deleted

## 2017-05-10 DIAGNOSIS — R269 Unspecified abnormalities of gait and mobility: Secondary | ICD-10-CM

## 2017-05-10 DIAGNOSIS — I69391 Dysphagia following cerebral infarction: Secondary | ICD-10-CM

## 2017-05-10 DIAGNOSIS — I69322 Dysarthria following cerebral infarction: Secondary | ICD-10-CM

## 2017-05-10 DIAGNOSIS — I69398 Other sequelae of cerebral infarction: Secondary | ICD-10-CM

## 2017-05-10 DIAGNOSIS — G8194 Hemiplegia, unspecified affecting left nondominant side: Secondary | ICD-10-CM

## 2017-05-10 DIAGNOSIS — I6350 Cerebral infarction due to unspecified occlusion or stenosis of unspecified cerebral artery: Secondary | ICD-10-CM

## 2017-05-10 LAB — CBC WITH DIFFERENTIAL/PLATELET
Basophils Absolute: 0 10*3/uL (ref 0.0–0.1)
Basophils Relative: 0 %
EOS PCT: 6 %
Eosinophils Absolute: 0.5 10*3/uL (ref 0.0–0.7)
HCT: 34.9 % — ABNORMAL LOW (ref 36.0–46.0)
Hemoglobin: 11.3 g/dL — ABNORMAL LOW (ref 12.0–15.0)
LYMPHS ABS: 0.7 10*3/uL (ref 0.7–4.0)
Lymphocytes Relative: 8 %
MCH: 28.6 pg (ref 26.0–34.0)
MCHC: 32.4 g/dL (ref 30.0–36.0)
MCV: 88.4 fL (ref 78.0–100.0)
MONO ABS: 1 10*3/uL (ref 0.1–1.0)
MONOS PCT: 12 %
Neutro Abs: 6 10*3/uL (ref 1.7–7.7)
Neutrophils Relative %: 74 %
PLATELETS: 246 10*3/uL (ref 150–400)
RBC: 3.95 MIL/uL (ref 3.87–5.11)
RDW: 12.8 % (ref 11.5–15.5)
WBC: 8.2 10*3/uL (ref 4.0–10.5)

## 2017-05-10 LAB — COMPREHENSIVE METABOLIC PANEL
ALT: 16 U/L (ref 14–54)
AST: 25 U/L (ref 15–41)
Albumin: 2.7 g/dL — ABNORMAL LOW (ref 3.5–5.0)
Alkaline Phosphatase: 66 U/L (ref 38–126)
Anion gap: 8 (ref 5–15)
BILIRUBIN TOTAL: 0.7 mg/dL (ref 0.3–1.2)
BUN: 8 mg/dL (ref 6–20)
CHLORIDE: 103 mmol/L (ref 101–111)
CO2: 24 mmol/L (ref 22–32)
CREATININE: 0.64 mg/dL (ref 0.44–1.00)
Calcium: 8.8 mg/dL — ABNORMAL LOW (ref 8.9–10.3)
GFR calc non Af Amer: >60 mL/min (ref >60)
Glucose, Bld: 101 mg/dL — ABNORMAL HIGH (ref 65–99)
POTASSIUM: 3.7 mmol/L (ref 3.5–5.1)
Sodium: 135 mmol/L (ref 135–145)
TOTAL PROTEIN: 6.2 g/dL — AB (ref 6.5–8.1)

## 2017-05-10 MED ORDER — TROLAMINE SALICYLATE 10 % EX CREA: TOPICAL_CREAM | Freq: Two times a day (BID) | CUTANEOUS | Status: DC | PRN

## 2017-05-10 MED ORDER — TRAMADOL HCL 50 MG PO TABS
25.0000 mg | ORAL_TABLET | Freq: Four times a day (QID) | ORAL | Status: DC | PRN
  Administered 2017-05-14 – 2017-05-18 (×3): 25 mg via ORAL
  Filled 2017-05-10 (×3): qty 1

## 2017-05-10 MED ORDER — GABAPENTIN 100 MG PO CAPS
100.0000 mg | ORAL_CAPSULE | Freq: Every day | ORAL | Status: DC
  Administered 2017-05-10 – 2017-05-24 (×15): 100 mg via ORAL
  Filled 2017-05-10 (×15): qty 1

## 2017-05-10 MED ORDER — MUSCLE RUB 10-15 % EX CREA
TOPICAL_CREAM | Freq: Two times a day (BID) | CUTANEOUS | Status: DC | PRN
  Administered 2017-05-10 – 2017-05-14 (×3): via TOPICAL
  Filled 2017-05-10: qty 85

## 2017-05-10 MED ORDER — TRAMADOL HCL 50 MG PO TABS
50.0000 mg | ORAL_TABLET | Freq: Every day | ORAL | Status: DC
  Administered 2017-05-10 – 2017-05-24 (×15): 50 mg via ORAL
  Filled 2017-05-10 (×15): qty 1

## 2017-05-10 MED ORDER — TAB-A-VITE/IRON PO TABS
1.0000 | ORAL_TABLET | Freq: Every day | ORAL | Status: DC
  Administered 2017-05-10 – 2017-05-25 (×16): 1 via ORAL
  Filled 2017-05-10 (×16): qty 1

## 2017-05-10 NOTE — Evaluation (Signed)
Physical Therapy Assessment and Plan  Patient Details  Name: Theresa Hampton MRN: [id number] Date of Birth: 09/19/1919  PT Diagnosis: Abnormality of gait, Coordination disorder, Hemiplegia non-dominant and Muscle weakness Rehab Potential: Good ELOS: 18-21days    Today's Date: 05/10/2017 PT Individual Time: 0347-4259 PT Individual Time Calculation (min): 65 min    Problem List:  Patient Active Problem List   Diagnosis Date Noted  . Right pontine stroke ([place]) 05/09/2017  . Tenosynovitis   . Benign essential HTN   . Chronic kidney disease   . Acute blood loss anemia   . Left wrist pain   . Dysphagia, post-stroke   . Cognitive deficit, post-stroke   . Acute left-sided weakness   . Facial droop 05/06/2017  . Stroke (cerebrum) ([place]) 05/06/2017  . Fatigue 07/29/2015  . Acute sinusitis 03/10/2015  . OA (osteoarthritis) of hip 01/06/2015  . Preop exam for internal medicine 12/16/2014  . HTN (hypertension) 06/09/2014  . Grief 06/09/2014  . Hypokalemia 06/05/2014  . Menopausal disorder 04/07/2014  . Acute pyelonephritis 04/09/2013  . Generalized anxiety disorder 04/09/2013  . Nausea alone 04/07/2013  . Tachycardia 04/07/2013  . Abdominal bloating 04/07/2013  . Neuropathic pain 11/20/2012  . URI, acute 08/21/2012  . Hip pain, left 11/17/2011  . Wrist pain, right 10/27/2010  . Weakness of both legs 10/27/2010  . Cramp of limb 07/09/2009  . B12 deficiency 09/16/2008  . EYE PAIN 02/07/2008  . Sinusitis, chronic 02/07/2008  . Allergic rhinitis 08/06/2007  . Left carotid bruit 08/06/2007  . Vitamin D deficiency 04/26/2007  . Peptic ulcer 04/26/2007  . Osteoarthritis 04/26/2007  . Headache 04/26/2007  . Hyperlipidemia 01/11/2007  . ANEMIA-IRON DEFICIENCY 01/11/2007  . GASTRIC ULCER, ACUTE, HEMORRHAGE 01/11/2007  . DUODENITIS 01/11/2007    Past Medical History:  Past Medical History:  Diagnosis Date  . Allergic rhinitis   . Arthritis    "hips" (05/08/2017)  . B12  deficiency anemia   . Chronic sinusitis   . Daily headache    "q am" (05/08/2017)  . History of transfusion 2004   "w/1st hip OR" (05/08/2017)  . Hyperlipidemia   . Hypertension   . Iron deficiency anemia   . Leg cramps   . Osteoarthritis    Dr. Maureen Ralphs  . Osteoporosis   . Peptic ulcer disease 2003  . PONV (postoperative nausea and vomiting)   . Pyelonephritis, acute 2014  . Seasonal allergies   . Seizures ([facility])    x1 with Lidocaine  . Stroke ([place]) 05/05/2017  . Vitamin B12 deficiency   . Vitamin D deficiency    Past Surgical History:  Past Surgical History:  Procedure Laterality Date  . CATARACT EXTRACTION, BILATERAL Bilateral   . DILATION AND CURETTAGE OF UTERUS    . ENDOMETRIAL ABLATION  2008  . JOINT REPLACEMENT    . THYROIDECTOMY, PARTIAL  "many years ago"  . TOTAL HIP ARTHROPLASTY Left 01/06/2015   Procedure: LEFT TOTAL HIP ARTHROPLASTY ANTERIOR APPROACH;  Surgeon: Gaynelle Arabian, MD;  Location: [facility];  Service: Orthopedics;  Laterality: Left;  . TOTAL HIP ARTHROPLASTY Right 2004    Assessment & Plan Clinical Impression: Patient is a 84 y.o.left handed femalewith history of HTN, CKD, OA, B-12 deficiency, one week history of difficulty walking progressing to increased falls.History taken from chart review and patient.She was admitted on 12/30/18AMafter found by family with left facial droop. MRI brain reviewed, showing right pontine CVA. Per report MRI/MRAright pontine stroke with moderate stenosis at origin of R-VA and moderate stenosis  L-SCA. Neurology recommended changing ASA to Plavix for stroke likely due to SVD. 2 D echo showed EF 55-60% with no wall abnormality, mild aortic stenosis with moderately thickened valve and moderate TVR.  She has had reports of buttock pain as well as left wrist pain since admission. X rays of pelvis showed evidence of prior sacral fracture and left wrist films showed moderate degenerative changes with soft tissue swelling distal wrist.  Speech therapy evaluation revealeddeficits in awareness, problem solving and attention. Patient transferred to CIR on 05/09/2017 .   Patient currently requires max with mobility secondary to muscle weakness and muscle joint tightness, decreased cardiorespiratoy endurance, decreased coordination and decreased motor planning, decreased attention to left and decreased motor planning, decreased attention, decreased awareness, decreased problem solving, decreased safety awareness, decreased memory and delayed processing and decreased sitting balance, decreased standing balance, decreased postural control, hemiplegia and decreased balance strategies.  Prior to hospitalization, patient was modified independent  with mobility and lived with Alone in a House home.  Home access is 2Stairs to enter.  Patient will benefit from skilled PT intervention to maximize safe functional mobility, minimize fall risk and decrease caregiver burden for planned discharge home with 24 hour assist.  Anticipate patient will benefit from follow up [facility] at discharge.  PT - End of Session Activity Tolerance: Tolerates < 10 min activity, no significant change in vital signs Endurance Deficit: Yes PT Assessment Rehab Potential (ACUTE/IP ONLY): Good PT Patient demonstrates impairments in the following area(s): Balance;Behavior;Edema;Endurance;Motor;Pain;Perception;Safety;Sensory;Skin Integrity;Nutrition PT Transfers Functional Problem(s): Bed Mobility;Car;Bed to Chair;Floor;Furniture PT Locomotion Functional Problem(s): Ambulation;Wheelchair Mobility;Stairs PT Plan PT Intensity: Minimum of 1-2 x/day ,45 to 90 minutes PT Frequency: 5 out of 7 days PT Duration Estimated Length of Stay: 18-21days  PT Treatment/Interventions: Ambulation/gait training;Balance/vestibular training;Cognitive remediation/compensation;Community reintegration;Discharge planning;Disease management/prevention;Functional electrical stimulation;Functional mobility  training;DME/adaptive equipment instruction;Neuromuscular re-education;Pain management;Skin care/wound management;Psychosocial support;Stair training;UE/LE Strength taining/ROM;Wheelchair propulsion/positioning;Therapeutic Activities;UE/LE Coordination activities;Patient/family education;Splinting/orthotics;Therapeutic Exercise;Visual/perceptual remediation/compensation PT Transfers Anticipated Outcome(s): Min Assist with LRAD PT Locomotion Anticipated Outcome(s): ambulatory with Min assist and LRAD PT Recommendation Recommendations for Other Services: Neuropsych consult Follow Up Recommendations: Home health PT Patient destination: Home Equipment Recommended: Wheelchair cushion (measurements);To be determined Equipment Details: pt has RW  Skilled Therapeutic Intervention Pt received sitting in WC and agreeable to PT. PT instructed patient in PT Evaluation and initiated treatment intervention; see below for results. PT educated patient in [place], rehab potential, rehab goals, and discharge recommendations. Pt returned to room and performed stedy transfer to bed with mod assist. Sit>supine completed with max assist to the L and pt left supine in bed with call bell in reach and all needs met.      PT Evaluation Precautions/Restrictions Precautions Precautions: Fall Precaution Comments: L painful wrist and saccum  Restrictions Weight Bearing Restrictions: No General   Vital SignsTherapy Vitals Temp: 98 F (36.7 C) Temp Source: Oral Pulse Rate: 93 BP: 110/65 Patient Position (if appropriate): Sitting Oxygen Therapy SpO2: 95 % O2 Device: Not Delivered Pain   0/10 at rest Home Living/Prior Functioning Home Living Available Help at Discharge: Personal care attendant Type of Home: House Home Access: Stairs to enter [employer] of Steps: 2 Home Layout: Two level;Able to live on main level with bedroom/bathroom Bathroom Shower/Tub: Chiropodist:  Handicapped height Bathroom Accessibility: Yes  Lives With: Alone Prior Function Vocation: Retired Vision/Perception  Vision - Assessment Additional Comments: decr visual attention to left Perception Perception: Impaired Inattention/Neglect: Does not attend to left visual field;Does not attend to left side  of body Praxis Praxis: Impaired Praxis Impairment Details: Motor planning  Cognition Overall Cognitive Status: Impaired/Different from baseline Arousal/Alertness: Awake/alert Orientation Level: Oriented X4 Attention: Selective Sustained Attention: Appears intact Sustained Attention Impairment: (P) Functional complex;Verbal complex Selective Attention: Appears intact Selective Attention Impairment: Verbal basic;Functional basic Memory: Appears intact Awareness: Impaired Awareness Impairment: Emergent impairment;Anticipatory impairment Problem Solving: Impaired Problem Solving Impairment: Functional basic Safety/Judgment: Appears intact Sensation Sensation Light Touch: Appears Intact Hot/Cold: Appears Intact Proprioception: Impaired Detail Proprioception Impaired Details: Impaired LUE;Impaired LLE Coordination Gross Motor Movements are Fluid and Coordinated: No Fine Motor Movements are Fluid and Coordinated: No Coordination and Movement Description: decreased motor control on the LUE and LLE Motor  Motor Motor: Abnormal postural alignment and control;Hemiplegia Motor - Skilled Clinical Observations: L sided hemiplegia   Mobility Bed Mobility Bed Mobility: Rolling Right;Rolling Left;Supine to Sit;Sit to Supine Rolling Right: 3: Mod assist Rolling Left: 3: Mod assist Supine to Sit: 3: Mod assist Sit to Supine: 3: Mod assist Transfers Transfers: Yes Sit to Stand: 2: Max assist Sit to Stand Details: Verbal cues for technique;Verbal cues for precautions/safety;Verbal cues for sequencing Stand to Sit: 3: Mod assist Squat Pivot Transfers: 2: Max Physiological scientist Details: Verbal cues for technique;Verbal cues for precautions/safety;Verbal cues for sequencing Locomotion  Ambulation Ambulation: Yes Ambulation/Gait Assistance: 2: Max assist Ambulation Distance (Feet): 10 Feet Assistive device: Rolling walker Gait Gait: Yes Gait Pattern: Trunk flexed;Lateral trunk lean to left;Left steppage Wheelchair Mobility Wheelchair Mobility: Yes Wheelchair Assistance: 2: Max Lexicographer: Both upper extremities Wheelchair Parts Management: Needs assistance Distance: 50f   Trunk/Postural Assessment  Cervical Assessment Cervical Assessment: Within Functional Limits Thoracic Assessment Thoracic Assessment: (slightly kyphotic) Lumbar Assessment Lumbar Assessment: (posterior pelvic tilt) Postural Control Postural Control: Deficits on evaluation Trunk Control: poor with lean to the right requiring cues to correct/ come to midline Righting Reactions: delayed  Protective Responses: delayed - would result in a fall  Balance Balance Balance Assessed: Yes Dynamic Sitting Balance Dynamic Sitting - Balance Support: During functional activity;Feet supported Dynamic Sitting - Level of Assistance: 3: Mod assist Static Standing Balance Static Standing - Balance Support: During functional activity;Bilateral upper extremity supported Static Standing - Level of Assistance: 3: Mod assist Dynamic Standing Balance Dynamic Standing - Balance Support: During functional activity Dynamic Standing - Level of Assistance: 1: +1 Total assist Extremity Assessment  RUE Assessment RUE Assessment: Within Functional Limits LUE Assessment LUE Assessment: Exceptions to WFL LUE AROM (degrees) LUE Overall AROM Comments: ROM- wrist and fingers swollen  LUE Strength LUE Overall Strength Comments: Brunstrom IV- overall very weak; some finger flexion; none observed in thumb LUE Tone LUE Tone Comments: low tone  RLE Assessment RLE Assessment: Within  Functional Limits(Grossly 4-/5 to 4/5, generizled weakness ) LLE Assessment LLE Assessment: Exceptions to [facility] Strength LLE Overall Strength Comments: 3/ knee extension and abduciton. 3-/5 knee flexion, and hip flexion, and hip  abduction   See Function Navigator for Current Functional Status.   Refer to Care Plan for Long Term Goals  Recommendations for other services: None   Discharge Criteria: Patient will be discharged from PT if patient refuses treatment 3 consecutive times without medical reason, if treatment goals not met, if there is a change in medical status, if patient makes no progress towards goals or if patient is discharged from hospital.  The above assessment, treatment plan, treatment alternatives and goals were discussed and mutually agreed upon: by patient  ALorie Phenix1/07/2017, 2:35 PM

## 2017-05-10 NOTE — Progress Notes (Signed)
Subjective/Complaints: Slept ok Occ low back and Left wrist pain Hx freq falls at home  ROS- no CP, SOB, no N/V/D  Objective: Vital Signs: Blood pressure (!) 116/44, pulse 78, temperature 97.9 F (36.6 C), temperature source Oral, resp. rate 18, height 5' 5" (1.651 m), weight 57 kg (125 lb 10.6 oz), SpO2 98 %. No results found. Results for orders placed or performed during the hospital encounter of 05/09/17 (from the past 72 hour(s))  CBC WITH DIFFERENTIAL     Status: Abnormal   Collection Time: 05/10/17  6:11 AM  Result Value Ref Range   WBC 8.2 4.0 - 10.5 K/uL   RBC 3.95 3.87 - 5.11 MIL/uL   Hemoglobin 11.3 (L) 12.0 - 15.0 g/dL   HCT 34.9 (L) 36.0 - 46.0 %   MCV 88.4 78.0 - 100.0 fL   MCH 28.6 26.0 - 34.0 pg   MCHC 32.4 30.0 - 36.0 g/dL   RDW 12.8 11.5 - 15.5 %   Platelets 246 150 - 400 K/uL   Neutrophils Relative % 74 %   Neutro Abs 6.0 1.7 - 7.7 K/uL   Lymphocytes Relative 8 %   Lymphs Abs 0.7 0.7 - 4.0 K/uL   Monocytes Relative 12 %   Monocytes Absolute 1.0 0.1 - 1.0 K/uL   Eosinophils Relative 6 %   Eosinophils Absolute 0.5 0.0 - 0.7 K/uL   Basophils Relative 0 %   Basophils Absolute 0.0 0.0 - 0.1 K/uL  Comprehensive metabolic panel     Status: Abnormal   Collection Time: 05/10/17  6:11 AM  Result Value Ref Range   Sodium 135 135 - 145 mmol/L   Potassium 3.7 3.5 - 5.1 mmol/L   Chloride 103 101 - 111 mmol/L   CO2 24 22 - 32 mmol/L   Glucose, Bld 101 (H) 65 - 99 mg/dL   BUN 8 6 - 20 mg/dL   Creatinine, Ser 0.64 0.44 - 1.00 mg/dL   Calcium 8.8 (L) 8.9 - 10.3 mg/dL   Total Protein 6.2 (L) 6.5 - 8.1 g/dL   Albumin 2.7 (L) 3.5 - 5.0 g/dL   AST 25 15 - 41 U/L   ALT 16 14 - 54 U/L   Alkaline Phosphatase 66 38 - 126 U/L   Total Bilirubin 0.7 0.3 - 1.2 mg/dL   GFR calc non Af Amer >60 >60 mL/min   GFR calc Af Amer >60 >60 mL/min    Comment: (NOTE) The eGFR has been calculated using the CKD EPI equation. This calculation has not been validated in all clinical  situations. eGFR's persistently <60 mL/min signify possible Chronic Kidney Disease.    Anion gap 8 5 - 15     HEENT: left periorbital ecchymosis Cardio: RRR and no murmur Resp: CTA B/L and unlabored GI: BS positive and NT, ND Extremity:  Pulses positive and No Edema Skin:   Bruise left periorbital Neuro: Alert/Oriented, Normal Sensory, Abnormal Motor 2-/5 Left delt, bi, tri, grip, Left Hip/knee ext synergy, 0/5 at ankle and Abnormal FMC Ataxic/ dec [facility] Musc/Skel:  Swelling left dorsum of hand and wrist , no pain wit PROM, no point tenderness Gen NAD   Assessment/Plan: 1. Functional deficits secondary to Right pontine infarct with left hemiparesis which require 3+ hours per day of interdisciplinary therapy in a comprehensive inpatient rehab setting. Physiatrist is providing close team supervision and 24 hour management of active medical problems listed below. Physiatrist and rehab team continue to assess barriers to discharge/monitor patient progress toward functional and medical  goals. FIM:                   Function - Comprehension Comprehension: Auditory  Function - Expression Expression: Verbal        Function - Memory Memory assist level: Recognizes or recalls 50 - 74% of the time/requires cueing 25 - 49% of the time Patient normally able to recall (first 3 days only): That he or she is in a hospital, Current season  Medical Problem List and Plan: 1.  Deficits with mobility, self-care, problem solving secondary to right pontine stroke Initiate CIR PT, OT,SLP 2.  DVT Prophylaxis/Anticoagulation: Pharmaceutical: Lovenox 3. Pain Management: Ice to left wrist and lower back. Continue Voltaren gel to help with myalgias.Add sportscreme to back      Encourage use of left wrist splint.  4. Mood: Team to provide ego support. Continue Remeron with prn Klonopin.   LCSW to follow for evaluation and support.  5. Neuropsych: This patient is capable of making decisions on  her own behalf. 6. Skin/Wound Care: Routine pressure relief measures. Maintain adequate nutritional and hydration status.  7. Fluids/Electrolytes/Nutrition: Monitor I/O. Check lytes in am.  8. B 12 deficiency: Continue Supplement 9. HTN: Monitor BP bid and monitor for orthostatic changes. Will  continue Norvasc for now. Monitor for  10. H/o of Iron deficiency anemia: Mild drop noted--add multivitamin with iron. Will recheck CBC in am.  Baseline 12.2 , now stable at 11.3 cont to monitor, check stool occult blood 11. Dyslipidemia: On Lipitor 12.  Dysphagia Stroke- D2 thin, monitor intake and for signs of asp, SLP 13.  Dysarthria due to CVA, SLP eval   LOS (Days) 1 A FACE TO FACE EVALUATION WAS PERFORMED  Charlett Blake 05/10/2017, 8:38 AM

## 2017-05-10 NOTE — Care Management Note (Signed)
Inpatient [facility] Individual Statement of Services  Patient Name:  Theresa Hampton  Date:  05/10/2017  Welcome to the [facility].  Our goal is to provide you with an individualized program based on your diagnosis and situation, designed to meet your specific needs.  With this comprehensive rehabilitation program, you will be expected to participate in at least 3 hours of rehabilitation therapies Monday-Friday, with modified therapy programming on the weekends.  Your rehabilitation program will include the following services:  Physical Therapy (PT), Occupational Therapy (OT), Speech Therapy (ST), 24 hour per day rehabilitation nursing, Case Management (Social Worker), Rehabilitation Medicine, Nutrition Services and Pharmacy Services  Weekly team conferences will be held on Wednesday to discuss your progress.  Your Social Worker will talk with you frequently to get your input and to update you on team discussions.  Team conferences with you and your family in attendance may also be held.  Expected length of stay: 18-21 days  Overall anticipated outcome: overall min assist level  Depending on your progress and recovery, your program may change. Your Social Worker will coordinate services and will keep you informed of any changes. Your Social Worker's name and contact numbers are listed  below.  The following services may also be recommended but are not provided by the [facility]:   [place] will be made to provide these services after discharge if needed.  Arrangements include referral to agencies that provide these services.  Your insurance has been verified to be:  Medicare & Commerical Generic Your primary doctor is:  Tyrone Apple Plotnikov  Pertinent information will be shared with your doctor and your insurance company.  Social Worker:  Ovidio Kin, [place] or [phone]  Information discussed with and copy given to patient by: Elease Hashimoto, 05/10/2017, 10:19 AM

## 2017-05-10 NOTE — Progress Notes (Signed)
Social Work Assessment and Plan Social Work Assessment and Plan  Patient Details  Name: Theresa Hampton MRN: [id number] Date of Birth: 1919-05-31  Today's Date: 05/10/2017  Problem List:  Patient Active Problem List   Diagnosis Date Noted  . Right pontine stroke ([place]) 05/09/2017  . Tenosynovitis   . Benign essential HTN   . Chronic kidney disease   . Acute blood loss anemia   . Left wrist pain   . Dysphagia, post-stroke   . Cognitive deficit, post-stroke   . Acute left-sided weakness   . Facial droop 05/06/2017  . Stroke (cerebrum) ([place]) 05/06/2017  . Fatigue 07/29/2015  . Acute sinusitis 03/10/2015  . OA (osteoarthritis) of hip 01/06/2015  . Preop exam for internal medicine 12/16/2014  . HTN (hypertension) 06/09/2014  . Grief 06/09/2014  . Hypokalemia 06/05/2014  . Menopausal disorder 04/07/2014  . Acute pyelonephritis 04/09/2013  . Generalized anxiety disorder 04/09/2013  . Nausea alone 04/07/2013  . Tachycardia 04/07/2013  . Abdominal bloating 04/07/2013  . Neuropathic pain 11/20/2012  . URI, acute 08/21/2012  . Hip pain, left 11/17/2011  . Wrist pain, right 10/27/2010  . Weakness of both legs 10/27/2010  . Cramp of limb 07/09/2009  . B12 deficiency 09/16/2008  . EYE PAIN 02/07/2008  . Sinusitis, chronic 02/07/2008  . Allergic rhinitis 08/06/2007  . Left carotid bruit 08/06/2007  . Vitamin D deficiency 04/26/2007  . Peptic ulcer 04/26/2007  . Osteoarthritis 04/26/2007  . Headache 04/26/2007  . Hyperlipidemia 01/11/2007  . ANEMIA-IRON DEFICIENCY 01/11/2007  . GASTRIC ULCER, ACUTE, HEMORRHAGE 01/11/2007  . DUODENITIS 01/11/2007   Past Medical History:  Past Medical History:  Diagnosis Date  . Allergic rhinitis   . Arthritis    "hips" (05/08/2017)  . B12 deficiency anemia   . Chronic sinusitis   . Daily headache    "q am" (05/08/2017)  . History of transfusion 2004   "w/1st hip OR" (05/08/2017)  . Hyperlipidemia   . Hypertension   . Iron deficiency  anemia   . Leg cramps   . Osteoarthritis    Dr. Maureen Ralphs  . Osteoporosis   . Peptic ulcer disease 2003  . PONV (postoperative nausea and vomiting)   . Pyelonephritis, acute 2014  . Seasonal allergies   . Seizures (HCC)    x1 with Lidocaine  . Stroke ([place]) 05/05/2017  . Vitamin B12 deficiency   . Vitamin D deficiency    Past Surgical History:  Past Surgical History:  Procedure Laterality Date  . CATARACT EXTRACTION, BILATERAL Bilateral   . DILATION AND CURETTAGE OF UTERUS    . ENDOMETRIAL ABLATION  2008  . JOINT REPLACEMENT    . THYROIDECTOMY, PARTIAL  "many years ago"  . TOTAL HIP ARTHROPLASTY Left 01/06/2015   Procedure: LEFT TOTAL HIP ARTHROPLASTY ANTERIOR APPROACH;  Surgeon: Gaynelle Arabian, MD;  Location: [facility];  Service: Orthopedics;  Laterality: Left;  . TOTAL HIP ARTHROPLASTY Right 2004   Social History:  reports that  has never smoked. she has never used smokeless tobacco. She reports that she does not drink alcohol or use drugs.  Family / Support Systems Marital Status: Widow/Widower Patient Roles: Parent Children: Karn Cassis [phone]  Other Supports: Ivory Broad [phone] Anticipated Caregiver: Daughter's along with hired caregiver-aide Clarise Cruz Ability/Limitations of Caregiver: Zigmund Daniel can only provide supervision levle only will see to increase Sara's hours Caregiver Availability: Other (Comment)(Working on a plan for pt's care) Family Dynamics: Close knit family who are there for one another and will do and arrange  what is needed. Made aware pt will require 24 hr upond discharge from rehab. Pt has been independent and wants to be as independent as possible prior to discharge from rehab.  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 Usually someone is here with her.   Abuse/Neglect Abuse/Neglect Assessment Can Be Completed: Yes Physical Abuse: Denies Verbal Abuse: Denies Sexual Abuse: Denies Exploitation of patient/patient's resources: Denies Self-Neglect: Denies  Emotional Status Pt's affect, behavior adn adjustment status: Pt is motivated to do well but is 84 yo and not used to all of this activity. She will try her best and give it her best effort. Disucssed can have rest breaks in her therapy day. She wants to be as independent as possible before leaving here. Recent Psychosocial Issues: other health issues-pain in hand and sacrum MD aware of and managing Pyschiatric History: No issues deferred depression screen due to coping appropriately and able to verbalize her concerns and feelings. Will monitor and follow along if neuro-psych services needed will make referral. Substance Abuse History: No issues  Patient / Family Perceptions, Expectations & Goals Pt/Family understanding of illness & functional limitations: Pt and daughter can explain her stroke and deficits, she is weak on her left side. She is hopeful she will do well and does talk with the MD and along with her caregiver and daughter to ask questions and find the treatment plan. All feel they have a good understanding of her condition and plan. Premorbid pt/family roles/activities: Mom, grandmother, retiree, home owner, church member, etc Anticipated changes in roles/activities/participation: resume Pt/family expectations/goals: Pt states: " I want to be able to move around and get back to what I could do before this happened."  Daughter states: " I hope she does well here."  [employer]: Other (Comment)(aide-Sara couple times per week) Premorbid Home Care/DME Agencies: Other (Comment)(had in the past) Transportation available at discharge: Family members pt did not drive  prior to admission Resource referrals recommended: Support group (specify)  Discharge Planning Living Arrangements: Alone Support Systems: Children, Other relatives, Friends/neighbors, Home care staff Type of Residence: Private residence Insurance Resources: [employer], Multimedia programmer (specify)(Commerical secondary insurance) Financial Resources: Social Security Financial Screen Referred: No Living Expenses: Own Money Management: Patient, Family Does the patient have any problems obtaining your medications?: No Home Management: Hired assist to do home management Patient/Family Preliminary Plans: Unsure at this time, family needs to come up with a plan. Checking into if Clarise Cruz can increae her hours and a daughter can stay with at night. Daughter's have health issues of their own and can not provide physical assist. Will need to see team's goals and work on a safe discharge plan. Pt does want to go back home and this is her goal while here. Social Work Anticipated Follow Up Needs: HH/OP, Support Group  Clinical Impression Pleasant female who looks younger than her stated age. She was functioning at home alone with some supplemental assist for home management and assist with transportation. Her two daughter's are both local and involved but have health limitations of their own. Will await team's goals and work on a safe discharge plan. Have informed her daughter she will need 24 hr care upon discharge from rehab.  Elease Hashimoto 05/10/2017, 10:36 AM

## 2017-05-10 NOTE — Evaluation (Signed)
Occupational Therapy Assessment and Plan  Patient Details  Name: Theresa Hampton MRN: [id number] Date of Birth: 1920-01-31  OT Diagnosis: acute pain, apraxia, hemiplegia affecting non-dominant side and muscle weakness (generalized) Rehab Potential: Rehab Potential (ACUTE ONLY): Good ELOS: ~20-24 days   Today's Date: 05/10/2017 OT Individual Time: 1100-1200 OT Individual Time Calculation (min): 60 min     Problem List:  Patient Active Problem List   Diagnosis Date Noted  . Right pontine stroke ([place]) 05/09/2017  . Tenosynovitis   . Benign essential HTN   . Chronic kidney disease   . Acute blood loss anemia   . Left wrist pain   . Dysphagia, post-stroke   . Cognitive deficit, post-stroke   . Acute left-sided weakness   . Facial droop 05/06/2017  . Stroke (cerebrum) ([place]) 05/06/2017  . Fatigue 07/29/2015  . Acute sinusitis 03/10/2015  . OA (osteoarthritis) of hip 01/06/2015  . Preop exam for internal medicine 12/16/2014  . HTN (hypertension) 06/09/2014  . Grief 06/09/2014  . Hypokalemia 06/05/2014  . Menopausal disorder 04/07/2014  . Acute pyelonephritis 04/09/2013  . Generalized anxiety disorder 04/09/2013  . Nausea alone 04/07/2013  . Tachycardia 04/07/2013  . Abdominal bloating 04/07/2013  . Neuropathic pain 11/20/2012  . URI, acute 08/21/2012  . Hip pain, left 11/17/2011  . Wrist pain, right 10/27/2010  . Weakness of both legs 10/27/2010  . Cramp of limb 07/09/2009  . B12 deficiency 09/16/2008  . EYE PAIN 02/07/2008  . Sinusitis, chronic 02/07/2008  . Allergic rhinitis 08/06/2007  . Left carotid bruit 08/06/2007  . Vitamin D deficiency 04/26/2007  . Peptic ulcer 04/26/2007  . Osteoarthritis 04/26/2007  . Headache 04/26/2007  . Hyperlipidemia 01/11/2007  . ANEMIA-IRON DEFICIENCY 01/11/2007  . GASTRIC ULCER, ACUTE, HEMORRHAGE 01/11/2007  . DUODENITIS 01/11/2007    Past Medical History:  Past Medical History:  Diagnosis Date  . Allergic rhinitis   .  Arthritis    "hips" (05/08/2017)  . B12 deficiency anemia   . Chronic sinusitis   . Daily headache    "q am" (05/08/2017)  . History of transfusion 2004   "w/1st hip OR" (05/08/2017)  . Hyperlipidemia   . Hypertension   . Iron deficiency anemia   . Leg cramps   . Osteoarthritis    Dr. Maureen Ralphs  . Osteoporosis   . Peptic ulcer disease 2003  . PONV (postoperative nausea and vomiting)   . Pyelonephritis, acute 2014  . Seasonal allergies   . Seizures (HCC)    x1 with Lidocaine  . Stroke ([place]) 05/05/2017  . Vitamin B12 deficiency   . Vitamin D deficiency    Past Surgical History:  Past Surgical History:  Procedure Laterality Date  . CATARACT EXTRACTION, BILATERAL Bilateral   . DILATION AND CURETTAGE OF UTERUS    . ENDOMETRIAL ABLATION  2008  . JOINT REPLACEMENT    . THYROIDECTOMY, PARTIAL  "many years ago"  . TOTAL HIP ARTHROPLASTY Left 01/06/2015   Procedure: LEFT TOTAL HIP ARTHROPLASTY ANTERIOR APPROACH;  Surgeon: Gaynelle Arabian, MD;  Location: [facility];  Service: Orthopedics;  Laterality: Left;  . TOTAL HIP ARTHROPLASTY Right 2004    Assessment & Plan Clinical Impression: Patient is a 84 y.o. year old left handed female with history of HTN, CKD, OA, B-12 deficiency, one week history of difficulty walking progressing to increased falls. History taken from chart review and patient. She was admitted on 05/06/17 AM after found by family with left facial droop. MRI brain reviewed, showing right pontine CVA. Per  report MRI/MRA right pontine stroke with moderate stenosis at origin of R-VA and moderate stenosis L-SCA. Neurology recommended changing ASA to Plavix for stroke likely due to SVD. 2 D echo showed EF 55-60% with no wall abnormality, mild aortic stenosis with moderately thickened valve and moderate TVR. She has had reports of buttock pain as well as left wrist pain since admission. X rays of pelvis showed evidence of prior sacral fracture and left wrist films showed moderate  degenerative changes with soft tissue swelling distal wrist. Patient transferred to CIR on 05/09/2017 .    Patient currently requires max A  with basic self-care skills and functional mobility  secondary to muscle weakness, decreased cardiorespiratoy endurance, impaired timing and sequencing, unbalanced muscle activation, motor apraxia, decreased coordination and decreased motor planning, decreased attention, decreased awareness, decreased problem solving, decreased safety awareness and decreased memory and decreased standing balance, decreased postural control, hemiplegia, decreased balance strategies and difficulty maintaining precautions.  Prior to hospitalization, patient could complete ADL with modified independent .  Patient will benefit from skilled intervention to decrease level of assist with basic self-care skills and increase independence with basic self-care skills prior to discharge home with care partner.  Anticipate patient will require intermittent supervision and follow up home health.  OT - End of Session Activity Tolerance: Tolerates 30+ min activity with multiple rests OT Assessment Rehab Potential (ACUTE ONLY): Good OT Patient demonstrates impairments in the following area(s): Balance;Perception;Safety;Sensory;Edema;Skin Integrity;Cognition;Endurance;Motor;Pain OT Basic ADL's Functional Problem(s): Grooming;Bathing;Dressing;Toileting OT Transfers Functional Problem(s): Toilet;Tub/Shower OT Additional Impairment(s): Fuctional Use of Upper Extremity OT Plan OT Intensity: Minimum of 1-2 x/day, 45 to 90 minutes OT Frequency: 5 out of 7 days OT Duration/Estimated Length of Stay: ~20-24 days OT Treatment/Interventions: Balance/vestibular training;Discharge planning;Functional electrical stimulation;Pain management;Self Care/advanced ADL retraining;Therapeutic Activities;UE/LE Coordination activities;Cognitive remediation/compensation;Disease mangement/prevention;Functional mobility  training;Skin care/wound managment;Therapeutic Exercise;Visual/perceptual remediation/compensation;Patient/family education;Community reintegration;DME/adaptive equipment instruction;Neuromuscular re-education;Psychosocial support;UE/LE Strength taining/ROM;Wheelchair propulsion/positioning;Splinting/orthotics OT Self Feeding Anticipated Outcome(s): n/a OT Basic Self-Care Anticipated Outcome(s): min A  OT Toileting Anticipated Outcome(s): min A  OT Bathroom Transfers Anticipated Outcome(s): min guard OT Recommendation Patient destination: Home Follow Up Recommendations: 24 hour supervision/assistance Equipment Recommended: To be determined   Skilled Therapeutic Intervention 1:1 Ot eval initiated. Self care retraining at sink level. Pt came to EOB with mod A with extra time for motor planning. Pt with forward and to the right lean requiring min - mod multimodal cues for coming to and maintaining midline. Pt transferred bed<w/c<>toilet with grab bar with max A squat pivot. Pt educated and taught hemi dressing techniques.  Sit to stand at sink initially with max A - rotating to the right requiring +2 to come adjust pants but then with the mirror able to come into midline and stand with more min A with initiation of full left knee extension with multimodal cuing.  Pt left up in the w/c with family.    OT Evaluation Precautions/Restrictions  Precautions Precautions: Fall Precaution Comments: L painful wrist and saccum  Restrictions Weight Bearing Restrictions: No General Chart Reviewed: Yes Family/Caregiver Present: Yes(daughters and caregiver) Vital Signs  Pain   Home Living/Prior Functioning Home Living Living Arrangements: Alone Available Help at Discharge: Personal care attendant Type of Home: House Home Access: Stairs to enter Technical brewer of Steps: 2 Home Layout: Two level, Able to live on main level with bedroom/bathroom Bathroom Shower/Tub: Print production planner: Handicapped height Bathroom Accessibility: Yes  Lives With: Alone Prior Function Vocation: Retired ADL ADL ADL Comments: see functional navigator Vision Baseline Vision/History:  Wears glasses Wears Glasses: Reading only Additional Comments: decr visual attention to left Perception  Perception: Impaired Inattention/Neglect: Does not attend to left visual field;Does not attend to left side of body Praxis Praxis: Impaired Praxis Impairment Details: Motor planning Cognition Overall Cognitive Status: Impaired/Different from baseline Arousal/Alertness: Awake/alert Orientation Level: Person;Place;Situation Person: Oriented Place: Oriented Situation: Oriented Year: 2019 Month: January Day of Week: Correct Memory: Appears intact Immediate Memory Recall: Sock;Blue;Bed Memory Recall: Sock;Blue;Bed Memory Recall Sock: Without Cue Memory Recall Blue: Without Cue Memory Recall Bed: Without Cue Attention: Selective Sustained Attention: Appears intact Sustained Attention Impairment: (P) Functional complex;Verbal complex Selective Attention: Appears intact Selective Attention Impairment: Verbal basic;Functional basic Awareness: Impaired Awareness Impairment: Emergent impairment;Anticipatory impairment Problem Solving: Impaired Problem Solving Impairment: Functional basic Safety/Judgment: Appears intact Sensation Sensation Light Touch: Appears Intact Hot/Cold: Appears Intact Proprioception: Impaired Detail Proprioception Impaired Details: Impaired LUE Coordination Gross Motor Movements are Fluid and Coordinated: No Fine Motor Movements are Fluid and Coordinated: No Coordination and Movement Description: decr [place] of left Ue Motor  Motor Motor: Abnormal postural alignment and control;Hemiplegia Mobility  Transfers Transfers: Sit to Stand;Stand to Sit Sit to Stand: 3: Mod assist Stand to Sit: 3: Mod assist  Trunk/Postural Assessment  Cervical Assessment Cervical  Assessment: Within Functional Limits Thoracic Assessment Thoracic Assessment: (slightly kyphotic) Lumbar Assessment Lumbar Assessment: (posterior pelvic tilt) Postural Control Postural Control: Deficits on evaluation Trunk Control: poor with lean to the right requiring cues to correct/ come to midline Righting Reactions: delayed  Protective Responses: delayed - would result in a fall  Balance Balance Balance Assessed: Yes Dynamic Sitting Balance Dynamic Sitting - Balance Support: During functional activity;Feet supported Dynamic Sitting - Level of Assistance: 3: Mod assist Static Standing Balance Static Standing - Balance Support: During functional activity;Bilateral upper extremity supported Static Standing - Level of Assistance: 3: Mod assist Dynamic Standing Balance Dynamic Standing - Balance Support: During functional activity Dynamic Standing - Level of Assistance: 1: +1 Total assist Extremity/Trunk Assessment RUE Assessment RUE Assessment: Within Functional Limits LUE Assessment LUE Assessment: Exceptions to WFL LUE AROM (degrees) LUE Overall AROM Comments: ROM- wrist and fingers swollen  LUE Strength LUE Overall Strength Comments: Brunstrom IV- overall very weak; some finger flexion; none observed in thumb LUE Tone LUE Tone Comments: low tone    See Function Navigator for Current Functional Status.   Refer to Care Plan for Long Term Goals  Recommendations for other services: None    Discharge Criteria: Patient will be discharged from OT if patient refuses treatment 3 consecutive times without medical reason, if treatment goals not met, if there is a change in medical status, if patient makes no progress towards goals or if patient is discharged from hospital.  The above assessment, treatment plan, treatment alternatives and goals were discussed and mutually agreed upon: by patient  Nicoletta Ba 05/10/2017, 1:55 PM

## 2017-05-10 NOTE — Evaluation (Signed)
Speech Language Pathology Assessment and Plan  Patient Details  Name: Theresa Hampton MRN: [id number] Date of Birth: 1919-08-26  SLP Diagnosis: Dysarthria;Dysphagia;Cognitive Impairments  Rehab Potential: Good ELOS: 2 weeks    Today's Date: 05/10/2017 SLP Individual Time: 0900-1000 SLP Individual Time Calculation (min): 60 min   Problem List:  Patient Active Problem List   Diagnosis Date Noted  . Right pontine stroke ([place]) 05/09/2017  . Tenosynovitis   . Benign essential HTN   . Chronic kidney disease   . Acute blood loss anemia   . Left wrist pain   . Dysphagia, post-stroke   . Cognitive deficit, post-stroke   . Acute left-sided weakness   . Facial droop 05/06/2017  . Stroke (cerebrum) ([place]) 05/06/2017  . Fatigue 07/29/2015  . Acute sinusitis 03/10/2015  . OA (osteoarthritis) of hip 01/06/2015  . Preop exam for internal medicine 12/16/2014  . HTN (hypertension) 06/09/2014  . Grief 06/09/2014  . Hypokalemia 06/05/2014  . Menopausal disorder 04/07/2014  . Acute pyelonephritis 04/09/2013  . Generalized anxiety disorder 04/09/2013  . Nausea alone 04/07/2013  . Tachycardia 04/07/2013  . Abdominal bloating 04/07/2013  . Neuropathic pain 11/20/2012  . URI, acute 08/21/2012  . Hip pain, left 11/17/2011  . Wrist pain, right 10/27/2010  . Weakness of both legs 10/27/2010  . Cramp of limb 07/09/2009  . B12 deficiency 09/16/2008  . EYE PAIN 02/07/2008  . Sinusitis, chronic 02/07/2008  . Allergic rhinitis 08/06/2007  . Left carotid bruit 08/06/2007  . Vitamin D deficiency 04/26/2007  . Peptic ulcer 04/26/2007  . Osteoarthritis 04/26/2007  . Headache 04/26/2007  . Hyperlipidemia 01/11/2007  . ANEMIA-IRON DEFICIENCY 01/11/2007  . GASTRIC ULCER, ACUTE, HEMORRHAGE 01/11/2007  . DUODENITIS 01/11/2007   Past Medical History:  Past Medical History:  Diagnosis Date  . Allergic rhinitis   . Arthritis    "hips" (05/08/2017)  . B12 deficiency anemia   . Chronic sinusitis    . Daily headache    "q am" (05/08/2017)  . History of transfusion 2004   "w/1st hip OR" (05/08/2017)  . Hyperlipidemia   . Hypertension   . Iron deficiency anemia   . Leg cramps   . Osteoarthritis    Dr. Maureen Ralphs  . Osteoporosis   . Peptic ulcer disease 2003  . PONV (postoperative nausea and vomiting)   . Pyelonephritis, acute 2014  . Seasonal allergies   . Seizures ([facility])    x1 with Lidocaine  . Stroke ([place]) 05/05/2017  . Vitamin B12 deficiency   . Vitamin D deficiency    Past Surgical History:  Past Surgical History:  Procedure Laterality Date  . CATARACT EXTRACTION, BILATERAL Bilateral   . DILATION AND CURETTAGE OF UTERUS    . ENDOMETRIAL ABLATION  2008  . JOINT REPLACEMENT    . THYROIDECTOMY, PARTIAL  "many years ago"  . TOTAL HIP ARTHROPLASTY Left 01/06/2015   Procedure: LEFT TOTAL HIP ARTHROPLASTY ANTERIOR APPROACH;  Surgeon: Gaynelle Arabian, MD;  Location: [facility];  Service: Orthopedics;  Laterality: Left;  . TOTAL HIP ARTHROPLASTY Right 2004    Assessment / Plan / Recommendation Clinical Impression Theresa Hampton a 84 y.o.left handed femalewith history of HTN, CKD, OA, B-12 deficiency, one week history of difficulty walking progressing to increased falls.History taken from chart review and patient.She was admitted on 12/30/18AMafter found by family with left facial droop. MRI brain reviewed, showing right pontine CVA. Per report MRI/MRAright pontine stroke with moderate stenosis at origin of R-VA and moderate stenosis L-SCA. Neurology recommended changing  ASA to Plavix for stroke likely due to SVD. 2 D echo showed EF 55-60% with no wall abnormality, mild aortic stenosis with moderately thickened valve and moderate TVR. She has had reports of buttock pain as well as left wrist pain since admission. X rays of pelvis showed evidence of prior sacral fracture and left wrist films showed moderate degenerative changes with soft tissue swelling distal wrist. Speech therapy  evaluation revealeddeficits in awareness, problem solving and attention. CIR recommended due to functional deficits. Pt admitted to CIR on 05/09/2017.  Comprehensive speech-language and bedside swallow evaluations were completed on 05/09/2017. Pt presents with moderate oral phase dysphagia d/t left-sided weakness with mild oral pocketing in left sulci with dysphagia 2. Pt with increased oral phase d/t mild holding of liquids and food textures prior to swallowing. Pt also demonstrates moderate flaccid dysarthria that reduces speech intelligibility to ~ 75% at the sentence to conversation level. Although pt lived alone, she had hired caregivers and family support for money and medication management she was able to perform basic functional and verbal problem solving tasks at baseline. During this evaluation, pt demonstrated deficits in the areas of selective attention, emergent awareness, and functional problem solving. Skilled ST is required to target the above mentioned deficits, increase functional independence and reduce caregiver burden.    Skilled Therapeutic Interventions          Skilled treatment focused on completion of SLE and BSE, see above. Education was completed with caregiver Theresa Hampton and she is signed off to supervise pt with PO intake. While consuming dysphagia 2 trials, pt required Min A cues to utilize lingual sweeps and liquid wash to clear residue. Once cued, strategies were very effective with clearing residue. Pt with mild oral holding of boluses and required Min A cues to swallow immediately instead of holding. With Min A cues, pt able to increase over articulation of sentences to increase speech intelligibility.    SLP Assessment  Patient will need skilled Speech Lanaguage Pathology Services during CIR admission    Recommendations  SLP Diet Recommendations: Dysphagia 2 (Fine chop);Thin Liquid Administration via: Cup;Straw Medication Administration: Whole meds with puree Supervision:  Patient able to self feed;Full supervision/cueing for compensatory strategies Compensations: Lingual sweep for clearance of pocketing;Small sips/bites;Slow rate Postural Changes and/or Swallow Maneuvers: Seated upright 90 degrees Oral Care Recommendations: Oral care BID Patient destination: Home Follow up Recommendations: Home Health SLP;24 hour supervision/assistance Equipment Recommended: None recommended by SLP    SLP Frequency 3 to 5 out of 7 days   SLP Duration  SLP Intensity  SLP Treatment/Interventions 2 weeks  Minumum of 1-2 x/day, 30 to 90 minutes  Patient/family education;Dysphagia/aspiration precaution training;Cognitive remediation/compensation;Functional tasks    Pain    Prior Functioning Cognitive/Linguistic Baseline: Within functional limits Type of Home: House  Lives With: Alone Available Help at Discharge: Personal care attendant(caregiver) Vocation: Retired  Function:  Eating Eating   Modified Consistency Diet: Yes Eating Assist Level: Set up assist for;Supervision or verbal cues   Eating Set Up Assist For: Opening containers;Cutting food       Cognition Comprehension Comprehension assist level: Follows basic conversation/direction with extra time/assistive device  Expression   Expression assist level: Expresses basic needs/ideas: With extra time/assistive device  Social Interaction Social Interaction assist level: Interacts appropriately 75 - 89% of the time - Needs redirection for appropriate language or to initiate interaction.  Problem Solving Problem solving assist level: Solves basic 75 - 89% of the time/requires cueing 10 - 24% of the time  Memory Memory assist level: Recognizes or recalls 75 - 89% of the time/requires cueing 10 - 24% of the time   Short Term Goals: Week 1: SLP Short Term Goal 1 (Week 1): Pt will consume least restrictive diet with Min A cues for compensatory swallow strategies.  SLP Short Term Goal 2 (Week 1): Given Min  Acues, pt will utilize speech intelligibility strategies to achieve ~90% intelligibility at the sentence level.  SLP Short Term Goal 3 (Week 1): Given Min A cues, pt will demonstrate selective attention for ~ 15 minutes in mildly distracting environment.  SLP Short Term Goal 4 (Week 1): Pt will demonstrate increase awareness but answering [facility] safety questions with Min A cues.  SLP Short Term Goal 5 (Week 1): Pt will complete basic problem solving tasks with supervision cues.  Refer to Care Plan for Long Term Goals  Recommendations for other services: None   Discharge Criteria: Patient will be discharged from SLP if patient refuses treatment 3 consecutive times without medical reason, if treatment goals not met, if there is a change in medical status, if patient makes no progress towards goals or if patient is discharged from hospital.  The above assessment, treatment plan, treatment alternatives and goals were discussed and mutually agreed upon: by patient  Theresa Hampton 05/10/2017, 1:16 PM

## 2017-05-10 NOTE — Plan of Care (Signed)
  RH BOWEL ELIMINATION RH STG MANAGE BOWEL WITH ASSISTANCE Description STG Manage Bowel with mod Assistance. LBM last Sat per caregiver. Miralax given this morning Pam PA made aware and ok with intervention as of this time since patient is not eating much. 05/10/2017 1011 - Not Progressing by Ander Slade, RN   RH BLADDER ELIMINATION RH STG MANAGE BLADDER WITH ASSISTANCE Description STG Manage Bladder With mod Assistance   Patient is incontinent  05/10/2017 1011 - Not Progressing by Ander Slade, RN

## 2017-05-10 NOTE — Telephone Encounter (Signed)
Pt was on TCM list presented to ED with recurrent falls, left facial droop 05/06/17 which she was admitted. Testing ordered -CT head: negative for acute intracranial abnormality- -MRI brain: Subacute nonhemorrhagic linear infarct involving the right paramedian pons. -MRA head/neck: Mild stenosis at the origin of the carotid artery bilaterally. Moderate stenosis at the origin of right vertebral artery. Moderate stenosis left superior cerebellar artery. Pt D/c 05/09/17 to inpatient rehabilitation to continue physical, occupational, and speech therapy....Chryl Heck

## 2017-05-10 NOTE — Telephone Encounter (Signed)
FYI

## 2017-05-11 ENCOUNTER — Inpatient Hospital Stay (HOSPITAL_COMMUNITY): Payer: Medicare Other

## 2017-05-11 ENCOUNTER — Inpatient Hospital Stay (HOSPITAL_COMMUNITY): Payer: Medicare Other | Admitting: Physical Therapy

## 2017-05-11 ENCOUNTER — Inpatient Hospital Stay (HOSPITAL_COMMUNITY): Payer: Medicare Other | Admitting: Speech Pathology

## 2017-05-11 LAB — CBC
HCT: 31.5 % — ABNORMAL LOW (ref 36.0–46.0)
HEMOGLOBIN: 10 g/dL — AB (ref 12.0–15.0)
MCH: 27.6 pg (ref 26.0–34.0)
MCHC: 31.7 g/dL (ref 30.0–36.0)
MCV: 87 fL (ref 78.0–100.0)
PLATELETS: 248 10*3/uL (ref 150–400)
RBC: 3.62 MIL/uL — ABNORMAL LOW (ref 3.87–5.11)
RDW: 12.6 % (ref 11.5–15.5)
WBC: 4.6 10*3/uL (ref 4.0–10.5)

## 2017-05-11 LAB — OCCULT BLOOD X 1 CARD TO LAB, STOOL: FECAL OCCULT BLD: NEGATIVE

## 2017-05-11 NOTE — Progress Notes (Signed)
Occupational Therapy Session Note  Patient Details  Name: Theresa Hampton MRN: [id number] Date of Birth: 1919-06-11  Today's Date: 05/11/2017 OT Individual Time: 7628-3151 OT Individual Time Calculation (min): 56 min    Short Term Goals: Week 1:  OT Short Term Goal 1 (Week 1): Pt will transfer on/off BSC/toilet with mod A OT Short Term Goal 2 (Week 1): Pt will don shirt with min A OT Short Term Goal 3 (Week 1): Pt will don LB clothing including shoes with mod A OT Short Term Goal 4 (Week 1): Pt will perform sit to stand with mod A in prep for LB dressing  OT Short Term Goal 5 (Week 1): Pt will transfer into shower with mod A for bathing  Skilled Therapeutic Interventions/Progress Updates:    1;1. Upon entering room, PCG (sarah), son and daughter present. Pt reporting fatigue but no pain. With encouragement pt willing to spongge bathe as this is what she does at home. Pt washes UB with supervision and A to wash back. Pt dons pull over shirt with A to pull shirt over head and down back as neck is tight (turtle neck shirt). Pt requires Vc for hemi techniques. Pt able to initate crossing LLE into figure four to thread pants however requires A to assume/maintain position as pt threads pant legs 2/2 weakness. D/t decreased sitting balance pt difficulty threading RLE into pants. Pt sit to stand with MAX A and L knee block (knee caves in) as OT advances pants past hips. Pt stand pivot transfer with MAX A into recliner and OT positions for comfort. Exited session with pt seated in recliner with call light in reach and all needs met.  Therapy Documentation Precautions:  Precautions Precautions: Fall Precaution Comments: L painful wrist and saccum  Restrictions Weight Bearing Restrictions: No  See Function Navigator for Current Functional Status.   Therapy/Group: Individual Therapy  Tonny Branch 05/11/2017, 11:26 AM

## 2017-05-11 NOTE — Progress Notes (Signed)
Speech Language Pathology Daily Session Note  Patient Details  Name: Theresa Hampton MRN: [id number] Date of Birth: [birth year]  Today's Date: 05/11/2017 SLP Individual Time: 0930-1030 SLP Individual Time Calculation (min): 60 min  Short Term Goals: Week 1: SLP Short Term Goal 1 (Week 1): Pt will consume least restrictive diet with Min A cues for compensatory swallow strategies.  SLP Short Term Goal 2 (Week 1): Given Min Acues, pt will utilize speech intelligibility strategies to achieve ~90% intelligibility at the sentence level.  SLP Short Term Goal 3 (Week 1): Given Min A cues, pt will demonstrate selective attention for ~ 15 minutes in mildly distracting environment.  SLP Short Term Goal 4 (Week 1): Pt will demonstrate increase awareness but answering [facility] safety questions with Min A cues.  SLP Short Term Goal 5 (Week 1): Pt will complete basic problem solving tasks with supervision cues.  Skilled Therapeutic Interventions: Skilled treatment session focused on dysphagia, speech and completion of caregiver education with daughter. SLP facilitated session by providing skilled observation of pt consuming medicine whole with thin liquids. Pt with good ability. Will liberalize medicines whole as pt tolerates. Pt with significant fatigue this session and as a result, her intelligibility was reduced to ~ 50% at the sentence level d/t decreased vocal intensity. Pt's daughter was present and was demanding to be singed off on safety plan to supervise feedings. Education provided and daughter voiced understanding. Pt was left upright in wheelchair with daughter and caregiver present. Continue per current plan of care.      Function:  Eating Eating   Modified Consistency Diet: Yes Eating Assist Level: Set up assist for;Supervision or verbal cues   Eating Set Up Assist For: Opening containers       Cognition Comprehension Comprehension assist level: Follows basic conversation/direction with extra  time/assistive device  Expression   Expression assist level: Expresses basic needs/ideas: With extra time/assistive device  Social Interaction Social Interaction assist level: Interacts appropriately 75 - 89% of the time - Needs redirection for appropriate language or to initiate interaction.  Problem Solving Problem solving assist level: Solves basic 75 - 89% of the time/requires cueing 10 - 24% of the time  Memory Memory assist level: Recognizes or recalls 75 - 89% of the time/requires cueing 10 - 24% of the time    Pain    Therapy/Group: Individual Therapy  Wynelle Dreier 05/11/2017, 12:48 PM

## 2017-05-12 ENCOUNTER — Inpatient Hospital Stay (HOSPITAL_COMMUNITY): Payer: Medicare Other | Admitting: Physical Therapy

## 2017-05-12 ENCOUNTER — Inpatient Hospital Stay (HOSPITAL_COMMUNITY): Payer: Medicare Other | Admitting: Occupational Therapy

## 2017-05-12 LAB — BASIC METABOLIC PANEL
ANION GAP: 10 (ref 5–15)
BUN: 11 mg/dL (ref 6–20)
CO2: 26 mmol/L (ref 22–32)
Calcium: 9 mg/dL (ref 8.9–10.3)
Chloride: 101 mmol/L (ref 101–111)
Creatinine, Ser: 0.72 mg/dL (ref 0.44–1.00)
GFR calc non Af Amer: >60 mL/min (ref >60)
GLUCOSE: 92 mg/dL (ref 65–99)
POTASSIUM: 4 mmol/L (ref 3.5–5.1)
Sodium: 137 mmol/L (ref 135–145)

## 2017-05-12 NOTE — Progress Notes (Signed)
Occupational Therapy Session Note  Patient Details  Name: Theresa Hampton MRN: [id number] Date of Birth: 1919/06/23  Today's Date: 05/12/2017 OT Individual Time: 1300-1415 OT Individual Time Calculation (min): 75 min    Short Term Goals: Week 1:  OT Short Term Goal 1 (Week 1): Pt will transfer on/off BSC/toilet with mod A OT Short Term Goal 2 (Week 1): Pt will don shirt with min A OT Short Term Goal 3 (Week 1): Pt will don LB clothing including shoes with mod A OT Short Term Goal 4 (Week 1): Pt will perform sit to stand with mod A in prep for LB dressing  OT Short Term Goal 5 (Week 1): Pt will transfer into shower with mod A for bathing  Skilled Therapeutic Interventions/Progress Updates:    Upon entering the room, pt supine in bed with family members present. Pt agreeable to OT intervention even after reporting extreme fatigue. Pt performed supine >sit with mod A to EOB. Pt performed stand pivot transfer with max A to wheelchair and therapist assisted pt into bathroom. Pt transferred onto toilet with max A and able to void and have BM. Pt performing hygiene from seated position but needing assistance for clothing management in standing. Pt performed hand hygiene at sink with set up A. Pt returning to bed secondary to fatigue with max A stand pivot transfer. Pt focusing on utilizing R UE for functional [facility] tasks such as buttons,zipper, and opening denture container with increased time and min cues for strategy. OT also providing pt with built up utensil for next meal in order to increase independence. Pt remained supine in bed with bed alarm activated and call bell within reach.   Therapy Documentation Precautions:  Precautions Precautions: Fall Precaution Comments: L painful wrist and saccum  Restrictions Weight Bearing Restrictions: No General:   Vital Signs: Therapy Vitals Temp: 97.9 F (36.6 C) Temp Source: Oral Pulse Rate: 86 Resp: 16 BP: (!) 127/59 Patient Position (if  appropriate): Lying Oxygen Therapy SpO2: 96 % O2 Device: Not Delivered Pain:   ADL: ADL ADL Comments: see functional navigator Vision   Perception    Praxis   Exercises:   Other Treatments:    See Function Navigator for Current Functional Status.   Therapy/Group: Individual Therapy  Gypsy Decant 05/12/2017, 4:41 PM

## 2017-05-12 NOTE — Progress Notes (Signed)
Physical Therapy Session Note  Patient Details  Name: Theresa Hampton MRN: [id number] Date of Birth: February 16, 1920  Today's Date: 05/12/2017 PT Individual Time: 1505-1600 PT Individual Time Calculation (min): 55 min   Short Term Goals: Week 1:  PT Short Term Goal 1 (Week 1): Pt will perform bed mobility with mod assist consistently  PT Short Term Goal 2 (Week 1): Pt will perform bed<>WC transfer with mod assist  PT Short Term Goal 3 (Week 1): Pt will ambulate 30f with max assist of 1 with LRAD  PT Short Term Goal 4 (Week 1): Pt will propell WC 1050fwith min assist   Skilled Therapeutic Interventions/Progress Updates:   Pt received supine in bed and agreeable to PT. Supine>sit transfer with mod assist from semirecumbent position. Scooting to EOB with mod assist and moderate cues.   [facility]. [facility] instructed pt in gait training with RW, L hand splint and mod-max assist, 2549f 2. Max cues for AD management and gait pattern. Pt noted to have intermittent L knee instability and required assist to advance the LLE. Prolonged rest break following each bout of gait training.   Dynamic balance training for cross body lateral reaches to place and remove clothes pins x 5 BUE.   Stand pivot transfer training x 5 with mod assist and RW. Moderate cues for AD management, posture and gait pattern.   Pt returned to room and performed stand pivot transfer to bed with mod assist. Sit>supine completed with mod assist and pt left supine in bed with call bell in reach and all needs met.          Therapy Documentation Precautions:  Precautions Precautions: Fall Precaution Comments: L painful wrist and saccum  Restrictions Weight Bearing Restrictions: No Vital Signs: Therapy Vitals Temp: 97.9 F (36.6 C) Temp Source: Oral Pulse Rate: 86 Resp: 16 BP: (!) 127/59 Patient Position (if appropriate): Lying Oxygen  Therapy SpO2: 96 % O2 Device: Not Delivered Pain:   0/10 at rest     See Function Navigator for Current Functional Status.   Therapy/Group: Individual Therapy  AusLorie Phenix5/2019, 4:05 PM

## 2017-05-12 NOTE — IPOC Note (Signed)
Overall Plan of Care [facility]) Patient Details Name: Theresa Hampton MRN: [id number] DOB: 1919/09/11  Admitting Diagnosis: <principal problem not specified>  Hospital Problems: Active Problems:   Right pontine stroke [facility])     Functional Problem List: Nursing Bladder, Pain, Bowel, Safety, Medication Management  PT Balance, Behavior, Edema, Endurance, Motor, Pain, Perception, Safety, Sensory, Skin Integrity, Nutrition  OT Balance, Perception, Safety, Sensory, Edema, Skin Integrity, Cognition, Endurance, Motor, Pain  SLP Cognition  TR         Basic ADL's: OT Grooming, Bathing, Dressing, Toileting     Advanced  ADL's: OT       Transfers: PT Bed Mobility, Car, Bed to Chair, Floor, Manufacturing systems engineer, Metallurgist: PT Ambulation, Emergency planning/management officer, Stairs     Additional Impairments: OT Fuctional Use of Upper Extremity  SLP Swallowing, Communication, Social Cognition expression Social Interaction, Problem Solving, Attention, Awareness  TR      Anticipated Outcomes Item Anticipated Outcome  Self Feeding n/a  Swallowing  Mod I    Basic self-care  min A   Toileting  min A    Bathroom Transfers min guard  Bowel/Bladder  to be continent of bladder and bowel with min assist  Transfers  Min Assist with LRAD  Locomotion  ambulatory with Min assist and LRAD  Communication  Supervision  Cognition  Supervision  Pain  less than 2 with min assist  Safety/Judgment  pt will be fall free during stay in rehab with min assist   Therapy Plan: PT Intensity: Minimum of 1-2 x/day ,45 to 90 minutes PT Frequency: 5 out of 7 days PT Duration Estimated Length of Stay: 18-21days  OT Intensity: Minimum of 1-2 x/day, 45 to 90 minutes OT Frequency: 5 out of 7 days OT Duration/Estimated Length of Stay: ~20-24 days SLP Intensity: Minumum of 1-2 x/day, 30 to 90 minutes SLP Frequency: 3 to 5 out of 7 days SLP Duration/Estimated Length of Stay: 2 weeks    Team  Interventions: Nursing Interventions Patient/Family Education, Discharge Planning, Bladder Management, Pain Management, Bowel Management, Medication Management  PT interventions Ambulation/gait training, Balance/vestibular training, Cognitive remediation/compensation, Community reintegration, Discharge planning, Disease management/prevention, Functional electrical stimulation, Functional mobility training, DME/adaptive equipment instruction, Neuromuscular re-education, Pain management, Skin care/wound management, Psychosocial support, Stair training, UE/LE Strength taining/ROM, Wheelchair propulsion/positioning, Therapeutic Activities, UE/LE Coordination activities, Patient/family education, Splinting/orthotics, Therapeutic Exercise, Visual/perceptual remediation/compensation  OT Interventions Balance/vestibular training, Discharge planning, Functional electrical stimulation, Pain management, Self Care/advanced ADL retraining, Therapeutic Activities, UE/LE Coordination activities, Cognitive remediation/compensation, Disease mangement/prevention, Functional mobility training, Skin care/wound managment, Therapeutic Exercise, Visual/perceptual remediation/compensation, Patient/family education, Academic librarian, Engineer, drilling, Neuromuscular re-education, Psychosocial support, UE/LE Strength taining/ROM, Wheelchair propulsion/positioning, Splinting/orthotics  SLP Interventions Patient/family education, Dysphagia/aspiration precaution training, Cognitive remediation/compensation, Functional tasks  TR Interventions    SW/CM Interventions Discharge Planning, Psychosocial Support, Patient/Family Education   Barriers to Discharge MD  Medical stability and Incontinence  Nursing Incontinence family to provide 24 hour care  PT      OT      SLP      SW       Team Discharge Planning: Destination: PT-Home ,OT- Home , SLP-Home Projected Follow-up: PT-Home health PT, OT-  24 hour  supervision/assistance, SLP-Home Health SLP, 24 hour supervision/assistance Projected Equipment Needs: PT-Wheelchair cushion (measurements), To be determined, OT- To be determined, SLP-None recommended by SLP Equipment Details: PT-pt has RW, OT-  Patient/family involved in discharge planning: PT- Patient,  OT-Patient, SLP-Patient, Other (Comment)(Paid caregiver)  MD  ELOS: 18-21d Medical Rehab Prognosis:  Good Assessment:  84 y.o.left handed femalewith history of HTN, CKD, OA, B-12 deficiency, one week history of difficulty walking progressing to increased falls.History taken from chart review and patient.She was admitted on 12/30/18AMafter found by family with left facial droop. MRI brain reviewed, showing right pontine CVA. Per report MRI/MRAright pontine stroke with moderate stenosis at origin of R-VA and moderate stenosis L-SCA. Neurology recommended changing ASA to Plavix for stroke likely due to SVD. 2 D echo showed EF 55-60% with no wall abnormality, mild aortic stenosis with moderately thickened valve and moderate TVR.  She has had reports of buttock pain as well as left wrist pain since admission. X rays of pelvis showed evidence of prior sacral fracture and left wrist films showed moderate degenerative changes with soft tissue swelling distal wrist.     Now requiring 24/7 Rehab RN,MD, as well as CIR level PT, OT and SLP.  Treatment team will focus on ADLs and mobility with goals set at MinA/Sup See Team Conference Notes for weekly updates to the plan of care

## 2017-05-12 NOTE — Progress Notes (Signed)
Physical Therapy Session Note  Patient Details  Name: Theresa Hampton MRN: [id number] Date of Birth: [birth year]  Today's Date: 05/12/2017 PT Individual Time: 0800-0905 PT Individual Time Calculation (min): 65 min   Short Term Goals: Week 1:  PT Short Term Goal 1 (Week 1): Pt will perform bed mobility with mod assist consistently  PT Short Term Goal 2 (Week 1): Pt will perform bed<>WC transfer with mod assist  PT Short Term Goal 3 (Week 1): Pt will ambulate 35ft with max assist of 1 with LRAD  PT Short Term Goal 4 (Week 1): Pt will propell WC 145ft with min assist   Skilled Therapeutic Interventions/Progress Updates:  Pt was seen bedside in the am. Pt transferred supine to edge of bed with side rail, head of bed elevated and max A with verbal cues. Pt transferred stand pivot edge of bed to w/c with max A and verbal cues. Pt performed toilet transfers with grab bar and mod to max A with verbal cues. Pt transferred sit to stand in parallel bars with mod A and verbal cues. Pt performed bed to chair transfers x 2 with rolling walker and mod to max A with verbal cues. Pt returned to room following treatment and left sitting up with family at bedside.   Therapy Documentation Precautions:  Precautions Precautions: Fall Precaution Comments: L painful wrist and saccum  Restrictions Weight Bearing Restrictions: No General:   Pain: No c/o pain.   See Function Navigator for Current Functional Status.   Therapy/Group: Individual Therapy  Dub Amis 05/12/2017, 12:36 PM

## 2017-05-12 NOTE — Progress Notes (Signed)
Subjective/Complaints:  No new issues daughters at bedside discussed pt's eating habits Some back pain-improving mainly in buttocks  ROS- no CP, SOB, no N/V/D  Objective: Vital Signs: Blood pressure 133/68, pulse 92, temperature 97.9 F (36.6 C), temperature source Oral, resp. rate 18, height '5\' 5"'  (1.651 m), weight 57.3 kg (126 lb 5.2 oz), SpO2 94 %. No results found. Results for orders placed or performed during the hospital encounter of 05/09/17 (from the past 72 hour(s))  CBC WITH DIFFERENTIAL     Status: Abnormal   Collection Time: 05/10/17  6:11 AM  Result Value Ref Range   WBC 8.2 4.0 - 10.5 K/uL   RBC 3.95 3.87 - 5.11 MIL/uL   Hemoglobin 11.3 (L) 12.0 - 15.0 g/dL   HCT 34.9 (L) 36.0 - 46.0 %   MCV 88.4 78.0 - 100.0 fL   MCH 28.6 26.0 - 34.0 pg   MCHC 32.4 30.0 - 36.0 g/dL   RDW 12.8 11.5 - 15.5 %   Platelets 246 150 - 400 K/uL   Neutrophils Relative % 74 %   Neutro Abs 6.0 1.7 - 7.7 K/uL   Lymphocytes Relative 8 %   Lymphs Abs 0.7 0.7 - 4.0 K/uL   Monocytes Relative 12 %   Monocytes Absolute 1.0 0.1 - 1.0 K/uL   Eosinophils Relative 6 %   Eosinophils Absolute 0.5 0.0 - 0.7 K/uL   Basophils Relative 0 %   Basophils Absolute 0.0 0.0 - 0.1 K/uL  Comprehensive metabolic panel     Status: Abnormal   Collection Time: 05/10/17  6:11 AM  Result Value Ref Range   Sodium 135 135 - 145 mmol/L   Potassium 3.7 3.5 - 5.1 mmol/L   Chloride 103 101 - 111 mmol/L   CO2 24 22 - 32 mmol/L   Glucose, Bld 101 (H) 65 - 99 mg/dL   BUN 8 6 - 20 mg/dL   Creatinine, Ser 0.64 0.44 - 1.00 mg/dL   Calcium 8.8 (L) 8.9 - 10.3 mg/dL   Total Protein 6.2 (L) 6.5 - 8.1 g/dL   Albumin 2.7 (L) 3.5 - 5.0 g/dL   AST 25 15 - 41 U/L   ALT 16 14 - 54 U/L   Alkaline Phosphatase 66 38 - 126 U/L   Total Bilirubin 0.7 0.3 - 1.2 mg/dL   GFR calc non Af Amer >60 >60 mL/min   GFR calc Af Amer >60 >60 mL/min    Comment: (NOTE) The eGFR has been calculated using the CKD EPI equation. This calculation  has not been validated in all clinical situations. eGFR's persistently <60 mL/min signify possible Chronic Kidney Disease.    Anion gap 8 5 - 15  CBC     Status: Abnormal   Collection Time: 05/11/17  5:11 AM  Result Value Ref Range   WBC 4.6 4.0 - 10.5 K/uL   RBC 3.62 (L) 3.87 - 5.11 MIL/uL   Hemoglobin 10.0 (L) 12.0 - 15.0 g/dL   HCT 31.5 (L) 36.0 - 46.0 %   MCV 87.0 78.0 - 100.0 fL   MCH 27.6 26.0 - 34.0 pg   MCHC 31.7 30.0 - 36.0 g/dL   RDW 12.6 11.5 - 15.5 %   Platelets 248 150 - 400 K/uL  Occult blood card to lab, stool RN will collect     Status: None   Collection Time: 05/11/17  1:48 PM  Result Value Ref Range   Fecal Occult Bld NEGATIVE NEGATIVE  Basic metabolic panel  Status: None   Collection Time: 05/12/17  7:31 AM  Result Value Ref Range   Sodium 137 135 - 145 mmol/L   Potassium 4.0 3.5 - 5.1 mmol/L   Chloride 101 101 - 111 mmol/L   CO2 26 22 - 32 mmol/L   Glucose, Bld 92 65 - 99 mg/dL   BUN 11 6 - 20 mg/dL   Creatinine, Ser 0.72 0.44 - 1.00 mg/dL   Calcium 9.0 8.9 - 10.3 mg/dL   GFR calc non Af Amer >60 >60 mL/min   GFR calc Af Amer >60 >60 mL/min    Comment: (NOTE) The eGFR has been calculated using the CKD EPI equation. This calculation has not been validated in all clinical situations. eGFR's persistently <60 mL/min signify possible Chronic Kidney Disease.    Anion gap 10 5 - 15     HEENT: left periorbital ecchymosis Cardio: RRR and no murmur Resp: CTA B/L and unlabored GI: BS positive and NT, ND Extremity:  Pulses positive and No Edema Skin:   Bruise left periorbital Neuro: Alert/Oriented, Normal Sensory, Abnormal Motor 2-/5 Left delt, bi, tri, grip, Left Hip/knee ext synergy, 0/5 at ankle and Abnormal FMC Ataxic/ dec [facility] Musc/Skel:  Swelling left dorsum of hand and wrist , no pain wit PROM, no point tenderness along the lumbosacral area Gen NAD   Assessment/Plan: 1. Functional deficits secondary to Right pontine infarct with left hemiparesis  which require 3+ hours per day of interdisciplinary therapy in a comprehensive inpatient rehab setting. Physiatrist is providing close team supervision and 24 hour management of active medical problems listed below. Physiatrist and rehab team continue to assess barriers to discharge/monitor patient progress toward functional and medical goals. FIM: Function - Bathing Position: Wheelchair/chair at sink Body parts bathed by patient: Left arm, Abdomen, Front perineal area, Right upper leg, Left upper leg Body parts bathed by helper: Right arm, Chest, Buttocks, Right lower leg, Left lower leg, Back Assist Level: Touching or steadying assistance(Pt > 75%)  Function- Upper Body Dressing/Undressing What is the patient wearing?: Pull over shirt/dress Pull over shirt/dress - Perfomed by patient: Thread/unthread right sleeve Pull over shirt/dress - Perfomed by helper: Thread/unthread left sleeve, Put head through opening, Pull shirt over trunk Assist Level: Touching or steadying assistance(Pt > 75%) Function - Lower Body Dressing/Undressing What is the patient wearing?: Pants, Non-skid slipper socks Position: Wheelchair/chair at sink Pants- Performed by patient: Thread/unthread right pants leg Pants- Performed by helper: Thread/unthread left pants leg, Pull pants up/down Non-skid slipper socks- Performed by helper: Don/doff right sock, Don/doff left sock Assist for footwear: Maximal assist Assist for lower body dressing: Touching or steadying assistance (Pt > 75%)  Function - Toileting Toileting steps completed by patient: Performs perineal hygiene Toileting steps completed by helper: Adjust clothing prior to toileting, Adjust clothing after toileting Assist level: Touching or steadying assistance (Pt.75%)  Function - Toilet Transfers Assist level to toilet: Maximal assist (Pt 25 - 49%/lift and lower) Assist level from toilet: Maximal assist (Pt 25 - 49%/lift and lower)  Function - Chair/bed  transfer Chair/bed transfer method: Stand pivot Chair/bed transfer assist level: Maximal assist (Pt 25 - 49%/lift and lower) Chair/bed transfer assistive device: Armrests  Function - Locomotion: Wheelchair Type: Manual Max wheelchair distance: 64f Assist Level: Maximal assistance (Pt 25 - 49%) Wheel 50 feet with 2 turns activity did not occur: Safety/medical concerns Wheel 150 feet activity did not occur: Safety/medical concerns Function - Locomotion: Ambulation Assistive device: Walker-rolling Max distance: 149fAssist level: Maximal assist (Pt  25 - 49%) Assist level: Maximal assist (Pt 25 - 49%) Walk 50 feet with 2 turns activity did not occur: Safety/medical concerns Walk 150 feet activity did not occur: Safety/medical concerns Walk 10 feet on uneven surfaces activity did not occur: Safety/medical concerns  Function - Comprehension Comprehension: Auditory Comprehension assist level: Follows basic conversation/direction with extra time/assistive device  Function - Expression Expression: Verbal Expression assist level: Expresses basic needs/ideas: With extra time/assistive device  Function - Social Interaction Social Interaction assist level: Interacts appropriately 75 - 89% of the time - Needs redirection for appropriate language or to initiate interaction.  Function - Problem Solving Problem solving assist level: Solves basic 75 - 89% of the time/requires cueing 10 - 24% of the time  Function - Memory Memory assist level: Recognizes or recalls 50 - 74% of the time/requires cueing 25 - 49% of the time Patient normally able to recall (first 3 days only): That he or she is in a hospital, Current season  Medical Problem List and Plan: 1.  Deficits with mobility, self-care, problem solving secondary to right pontine stroke Cont CIR PT, OT,SLP 2.  DVT Prophylaxis/Anticoagulation: Pharmaceutical: Lovenox 3. Pain Management: Ice to left wrist and lower back. Continue Voltaren  gel to help with myalgias.Add sportscreme to back      Encourage use of left wrist splint.  4. Mood: Team to provide ego support. Continue Remeron with prn Klonopin.   LCSW to follow for evaluation and support.  5. Neuropsych: This patient is capable of making decisions on her own behalf. 6. Skin/Wound Care: Routine pressure relief measures. Maintain adequate nutritional and hydration status.  7. Fluids/Electrolytes/Nutrition: Monitor I/O. 300-485m per day- per family, freq small meals at home BMET nl, 10-75% meals 8. B 12 deficiency: Continue Supplement 9. HTN: Monitor BP bid and monitor for orthostatic changes. Will  continue Norvasc for now. Monitor for  10. H/o of Iron deficiency anemia: Mild drop noted--add multivitamin with iron. Will recheck CBC in am.  Baseline 12.2 , now stable at 11.3 cont to monitor, Negative stool occult blood 11. Dyslipidemia: On Lipitor 12.  Dysphagia Stroke- D2 thin, monitor intake and for signs of asp, SLP 13.  Dysarthria due to CVA, SLP eval 14.  Insomnia takes trazodone, remeron, tramadol and gabapentin at night  LOS (Days) 3 A FACE TO FACE EVALUATION WAS PERFORMED  ACharlett Blake1/09/2017, 9:34 AM

## 2017-05-12 NOTE — Progress Notes (Signed)
Physical Therapy Session Note  Patient Details  Name: Theresa Hampton MRN: [id number] Date of Birth: 1919/07/27  Today's Date: 05/12/2017 PT Individual Time:1310-1415   65 min   Short Term Goals: Week 1:  PT Short Term Goal 1 (Week 1): Pt will perform bed mobility with mod assist consistently  PT Short Term Goal 2 (Week 1): Pt will perform bed<>WC transfer with mod assist  PT Short Term Goal 3 (Week 1): Pt will ambulate 90f with max assist of 1 with LRAD  PT Short Term Goal 4 (Week 1): Pt will propell WC 103fwith min assist   Skilled Therapeutic Interventions/Progress Updates:    Pt received sitting in WC and agreeable to PT PT transported pt to rehab gym. Squat pivot transfer to Nustep with mod assist. nustep reciprocal movement training 5 min +5 min BLE only and BLE support bars to maintain neutral hip alignment. Pt required prolonged rest break following each bout of reciprocal movement training.   Gait training instructed by PT with max assist +2. For WC follow at rail in hall x 2523fPT required to facilitate trunk extension, LLE limb advancement and LLE knee extension to prevent collapse in R SLS. Pt noted to have improved knee extension and trunk extension as gait progressed.   Pt returned to room and performed mod-max assist squat pivot transfer to bed. Sit>supine completed with mod assist from L side of bed.   PT instructed pt in supine NMR:  SAQ 2 x 10 BLE.  Clam shells x 12 BLE.  Heel slides x 10 BLE with AAROM on the L.  Hip abduction x 10 BLE with AAROM on the L.   Throughout NMR: pt required cues for proper ROM and decreased compensation through trunk to improve proper muscle activation in the LLE.   Pt left supine in bed with call bell in reach and all needs met.      Therapy Documentation Precautions:  Precautions Precautions: Fall Precaution Comments: L painful wrist and saccum  Restrictions Weight Bearing Restrictions: No Vital Signs: Therapy  Vitals Temp: 97.9 F (36.6 C) Temp Source: Oral Pulse Rate: 92 Resp: 18 BP: (!) 120/51 Patient Position (if appropriate): Lying Oxygen Therapy SpO2: 94 % O2 Device: Not Delivered   See Function Navigator for Current Functional Status.   Therapy/Group: Individual Therapy  AusLorie Phenix5/2019, 6:00 AM

## 2017-05-12 NOTE — Plan of Care (Signed)
  Progressing RH BLADDER ELIMINATION RH STG MANAGE BLADDER WITH ASSISTANCE Description STG Manage Bladder With mod Assistance  05/12/2017 0004 - Progressing by Edd Arbour, RN RH SAFETY RH STG ADHERE TO SAFETY PRECAUTIONS W/ASSISTANCE/DEVICE Description STG Adhere to Safety Precautions With  Mod Assistance/Device.  05/12/2017 0004 - Progressing by Edd Arbour, RN RH STG DECREASED RISK OF FALL WITH ASSISTANCE Description STG Decreased Risk of Fall With Assistance. 05/12/2017 0004 - Progressing by Edd Arbour, RN RH PAIN MANAGEMENT RH STG PAIN MANAGED AT OR BELOW PT'S PAIN GOAL Description Pain less than 3  05/12/2017 0004 - Progressing by Edd Arbour, RN

## 2017-05-13 NOTE — Progress Notes (Signed)
Subjective/Complaints:  Visiting with family, no new issues overnite, reviewed labwork with pt an daughters  ROS- no CP, SOB, no N/V/D  Objective: Vital Signs: Blood pressure (!) 127/56, pulse 87, temperature 97.7 F (36.5 C), temperature source Oral, resp. rate 16, height '5\' 5"'  (1.651 m), weight 57 kg (125 lb 10.6 oz), SpO2 92 %. No results found. Results for orders placed or performed during the hospital encounter of 05/09/17 (from the past 72 hour(s))  CBC     Status: Abnormal   Collection Time: 05/11/17  5:11 AM  Result Value Ref Range   WBC 4.6 4.0 - 10.5 K/uL   RBC 3.62 (L) 3.87 - 5.11 MIL/uL   Hemoglobin 10.0 (L) 12.0 - 15.0 g/dL   HCT 31.5 (L) 36.0 - 46.0 %   MCV 87.0 78.0 - 100.0 fL   MCH 27.6 26.0 - 34.0 pg   MCHC 31.7 30.0 - 36.0 g/dL   RDW 12.6 11.5 - 15.5 %   Platelets 248 150 - 400 K/uL  Occult blood card to lab, stool RN will collect     Status: None   Collection Time: 05/11/17  1:48 PM  Result Value Ref Range   Fecal Occult Bld NEGATIVE NEGATIVE  Basic metabolic panel     Status: None   Collection Time: 05/12/17  7:31 AM  Result Value Ref Range   Sodium 137 135 - 145 mmol/L   Potassium 4.0 3.5 - 5.1 mmol/L   Chloride 101 101 - 111 mmol/L   CO2 26 22 - 32 mmol/L   Glucose, Bld 92 65 - 99 mg/dL   BUN 11 6 - 20 mg/dL   Creatinine, Ser 0.72 0.44 - 1.00 mg/dL   Calcium 9.0 8.9 - 10.3 mg/dL   GFR calc non Af Amer >60 >60 mL/min   GFR calc Af Amer >60 >60 mL/min    Comment: (NOTE) The eGFR has been calculated using the CKD EPI equation. This calculation has not been validated in all clinical situations. eGFR's persistently <60 mL/min signify possible Chronic Kidney Disease.    Anion gap 10 5 - 15     HEENT: left periorbital ecchymosis Cardio: RRR and no murmur Resp: CTA B/L and unlabored GI: BS positive and NT, ND Extremity:  Pulses positive and No Edema Skin:   Bruise left periorbital Neuro: Alert/Oriented, Normal Sensory, Abnormal Motor 2-/5 Left  delt, bi, tri, grip, Left Hip/knee ext synergy, 0/5 at ankle and Abnormal FMC Ataxic/ dec [facility] Musc/Skel:  Swelling left dorsum of hand and wrist , no pain wit PROM, no point tenderness along the lumbosacral area Gen NAD   Assessment/Plan: 1. Functional deficits secondary to Right pontine infarct with left hemiparesis which require 3+ hours per day of interdisciplinary therapy in a comprehensive inpatient rehab setting. Physiatrist is providing close team supervision and 24 hour management of active medical problems listed below. Physiatrist and rehab team continue to assess barriers to discharge/monitor patient progress toward functional and medical goals. FIM: Function - Bathing Position: Wheelchair/chair at sink Body parts bathed by patient: Left arm, Abdomen, Front perineal area, Right upper leg, Left upper leg Body parts bathed by helper: Right arm, Chest, Buttocks, Right lower leg, Left lower leg, Back Assist Level: Touching or steadying assistance(Pt > 75%)  Function- Upper Body Dressing/Undressing What is the patient wearing?: Pull over shirt/dress Pull over shirt/dress - Perfomed by patient: Thread/unthread right sleeve Pull over shirt/dress - Perfomed by helper: Thread/unthread left sleeve, Put head through opening, Pull shirt over trunk Assist  Level: Touching or steadying assistance(Pt > 75%) Function - Lower Body Dressing/Undressing What is the patient wearing?: Pants, Non-skid slipper socks Position: Wheelchair/chair at sink Pants- Performed by patient: Thread/unthread right pants leg Pants- Performed by helper: Thread/unthread left pants leg, Pull pants up/down Non-skid slipper socks- Performed by helper: Don/doff right sock, Don/doff left sock Assist for footwear: Maximal assist Assist for lower body dressing: Touching or steadying assistance (Pt > 75%)  Function - Toileting Toileting steps completed by patient: Performs perineal hygiene Toileting steps completed by  helper: Adjust clothing prior to toileting, Adjust clothing after toileting Toileting Assistive Devices: Grab bar or rail Assist level: (max A)  Function - Air cabin crew transfer assistive device: Elevated toilet seat/BSC over toilet Assist level to toilet: Maximal assist (Pt 25 - 49%/lift and lower) Assist level from toilet: Maximal assist (Pt 25 - 49%/lift and lower)  Function - Chair/bed transfer Chair/bed transfer method: Stand pivot Chair/bed transfer assist level: Moderate assist (Pt 50 - 74%/lift or lower) Chair/bed transfer assistive device: Walker  Function - Locomotion: Wheelchair Type: Manual Max wheelchair distance: 67f Assist Level: Maximal assistance (Pt 25 - 49%) Wheel 50 feet with 2 turns activity did not occur: Safety/medical concerns Wheel 150 feet activity did not occur: Safety/medical concerns Turns around,maneuvers to table,bed, and toilet,negotiates 3% grade,maneuvers on rugs and over doorsills: No Function - Locomotion: Ambulation Ambulation activity did not occur: N/A Assistive device: Walker-rolling Max distance: 25 Assist level: Moderate assist (Pt 50 - 74%) Walk 10 feet activity did not occur: N/A Assist level: Moderate assist (Pt 50 - 74%) Walk 50 feet with 2 turns activity did not occur: N/A Walk 150 feet activity did not occur: N/A Walk 10 feet on uneven surfaces activity did not occur: N/A  Function - Comprehension Comprehension: Auditory Comprehension assistive device: Hearing aids Comprehension assist level: Follows basic conversation/direction with extra time/assistive device  Function - Expression Expression: Verbal Expression assist level: Expresses basic needs/ideas: With extra time/assistive device  Function - Social Interaction Social Interaction assist level: Interacts appropriately 75 - 89% of the time - Needs redirection for appropriate language or to initiate interaction.  Function - Problem Solving Problem solving  assist level: Solves basic 75 - 89% of the time/requires cueing 10 - 24% of the time  Function - Memory Memory assist level: Recognizes or recalls 50 - 74% of the time/requires cueing 25 - 49% of the time Patient normally able to recall (first 3 days only): That he or she is in a hospital, Current season  Medical Problem List and Plan: 1.  Deficits with mobility, self-care, problem solving secondary to right pontine stroke Cont CIR PT, OT,SLP 2.  DVT Prophylaxis/Anticoagulation: Pharmaceutical: Lovenox 3. Pain Management: Ice to left wrist and lower back. Continue Voltaren gel to help with myalgias.Add sportscreme to back      Encourage use of left wrist splint.  4. Mood: Team to provide ego support. Continue Remeron with prn Klonopin.   LCSW to follow for evaluation and support.  5. Neuropsych: This patient is capable of making decisions on her own behalf. 6. Skin/Wound Care: Routine pressure relief measures. Maintain adequate nutritional and hydration status.  7. Fluids/Electrolytes/Nutrition: Monitor I/O. 300-407mper day-  But only 6036mesterday BMET nl, 10-75% meals 8. B 12 deficiency: Continue Supplement 9. HTN: Monitor BP bid and monitor for orthostatic changes. Will  continue Norvasc for now. Monitor for  10. H/o of Iron deficiency anemia: Mild drop noted--add multivitamin with iron. Will recheck CBC in am.  Baseline 12.2 , now stable at 11.3 cont to monitor, Negative stool occult blood 11. Dyslipidemia: On Lipitor 12.  Dysphagia Stroke- D2 thin, monitor intake and for signs of asp, SLP 13.  Dysarthria due to CVA, SLP eval 14.  Insomnia takes trazodone, remeron, tramadol and gabapentin at night  LOS (Days) 4 A FACE TO FACE EVALUATION WAS PERFORMED  Charlett Blake 05/13/2017, 10:09 AM

## 2017-05-13 NOTE — Plan of Care (Signed)
  Progressing RH BLADDER ELIMINATION RH STG MANAGE BLADDER WITH ASSISTANCE Description STG Manage Bladder With mod Assistance  05/13/2017 0203 - Progressing by Edd Arbour, RN RH SKIN INTEGRITY RH STG MAINTAIN SKIN INTEGRITY WITH ASSISTANCE Description STG Maintain Skin Integrity With mod Assistance.  05/13/2017 0203 - Progressing by Edd Arbour, RN RH SAFETY RH STG ADHERE TO SAFETY PRECAUTIONS W/ASSISTANCE/DEVICE Description STG Adhere to Safety Precautions With  Mod Assistance/Device.  05/13/2017 0203 - Progressing by Edd Arbour, RN RH PAIN MANAGEMENT RH STG PAIN MANAGED AT OR BELOW PT'S PAIN GOAL Description Pain less than 3  05/13/2017 0203 - Progressing by Edd Arbour, RN

## 2017-05-13 NOTE — Plan of Care (Signed)
  Progressing RH BLADDER ELIMINATION RH STG MANAGE BLADDER WITH ASSISTANCE Description STG Manage Bladder With mod Assistance  05/13/2017 2246 - Progressing by Edd Arbour, RN RH SKIN INTEGRITY RH STG MAINTAIN SKIN INTEGRITY WITH ASSISTANCE Description STG Maintain Skin Integrity With mod Assistance.  05/13/2017 2246 - Progressing by Edd Arbour, RN RH SAFETY RH STG ADHERE TO SAFETY PRECAUTIONS W/ASSISTANCE/DEVICE Description STG Adhere to Safety Precautions With  Mod Assistance/Device.  05/13/2017 2246 - Progressing by Edd Arbour, RN RH STG DECREASED RISK OF FALL WITH ASSISTANCE Description STG Decreased Risk of Fall With Assistance. 05/13/2017 2246 - Progressing by Edd Arbour, RN RH PAIN MANAGEMENT RH STG PAIN MANAGED AT OR BELOW PT'S PAIN GOAL Description Pain less than 3  05/13/2017 2246 - Progressing by Edd Arbour, RN

## 2017-05-14 ENCOUNTER — Other Ambulatory Visit: Payer: Self-pay

## 2017-05-14 ENCOUNTER — Inpatient Hospital Stay (HOSPITAL_COMMUNITY): Payer: Medicare Other | Admitting: Speech Pathology

## 2017-05-14 ENCOUNTER — Inpatient Hospital Stay (HOSPITAL_COMMUNITY): Payer: Medicare Other | Admitting: Physical Therapy

## 2017-05-14 ENCOUNTER — Inpatient Hospital Stay (HOSPITAL_COMMUNITY): Payer: Medicare Other | Admitting: Occupational Therapy

## 2017-05-14 NOTE — Progress Notes (Signed)
Speech Language Pathology Daily Session Note  Patient Details  Name: Theresa Hampton MRN: [id number] Date of Birth: 1919-06-30  Today's Date: 05/14/2017 SLP Individual Time: 0730-0830 SLP Individual Time Calculation (min): 60 min  Short Term Goals: Week 1: SLP Short Term Goal 1 (Week 1): Pt will consume least restrictive diet with Min A cues for compensatory swallow strategies.  SLP Short Term Goal 2 (Week 1): Given Min Acues, pt will utilize speech intelligibility strategies to achieve ~90% intelligibility at the sentence level.  SLP Short Term Goal 3 (Week 1): Given Min A cues, pt will demonstrate selective attention for ~ 15 minutes in mildly distracting environment.  SLP Short Term Goal 4 (Week 1): Pt will demonstrate increase awareness but answering [facility] safety questions with Min A cues.  SLP Short Term Goal 5 (Week 1): Pt will complete basic problem solving tasks with supervision cues.  Skilled Therapeutic Interventions: Skilled treatment session focused on dysphagia and cognition goals as well as education regarding PO textures. SLP facilitated session by providing skilled observation of pt consuming dysphagia 2 breakfast tray with thin liquids via straw. Pt required Max A verbal cues faded to Mod A cues to use lingual sweep to clear left buccal area. Unable to fade cues further as pt frequently left half bolus in buccal area when cues were faded. Pt required Mod A cues to demonstrate selective attention between tasks. Caregiver reported some confusion about diet textures with dietary, SLP attempted to solve. Pt continues to benefit from dysphagia 2 diet. Pt was left upright in bed with caregiver present. Continue per current plan of care.      Function:  Eating Eating   Modified Consistency Diet: Yes Eating Assist Level: Set up assist for;Supervision or verbal cues   Eating Set Up Assist For: Opening containers       Cognition Comprehension Comprehension assist level: Follows  basic conversation/direction with extra time/assistive device  Expression   Expression assist level: Expresses basic needs/ideas: With extra time/assistive device  Social Interaction Social Interaction assist level: Interacts appropriately 75 - 89% of the time - Needs redirection for appropriate language or to initiate interaction.  Problem Solving Problem solving assist level: Solves basic 75 - 89% of the time/requires cueing 10 - 24% of the time;Solves basic 50 - 74% of the time/requires cueing 25 - 49% of the time(With use of compensatory swallow strategies)  Memory Memory assist level: Recognizes or recalls 75 - 89% of the time/requires cueing 10 - 24% of the time    Pain    Therapy/Group: Individual Therapy  Wendall Isabell 05/14/2017, 10:29 AM

## 2017-05-14 NOTE — Progress Notes (Signed)
Subjective/Complaints:  Slept ok, denies urinary incont.  No therapy yesterday  ROS- no CP, SOB, no N/V/D  Objective: Vital Signs: Blood pressure (!) 145/87, pulse 86, temperature 98.6 F (37 C), temperature source Oral, resp. rate 16, height '5\' 5"'  (1.651 m), weight 56.3 kg (124 lb 1.9 oz), SpO2 96 %. No results found. Results for orders placed or performed during the hospital encounter of 05/09/17 (from the past 72 hour(s))  Occult blood card to lab, stool RN will collect     Status: None   Collection Time: 05/11/17  1:48 PM  Result Value Ref Range   Fecal Occult Bld NEGATIVE NEGATIVE  Basic metabolic panel     Status: None   Collection Time: 05/12/17  7:31 AM  Result Value Ref Range   Sodium 137 135 - 145 mmol/L   Potassium 4.0 3.5 - 5.1 mmol/L   Chloride 101 101 - 111 mmol/L   CO2 26 22 - 32 mmol/L   Glucose, Bld 92 65 - 99 mg/dL   BUN 11 6 - 20 mg/dL   Creatinine, Ser 0.72 0.44 - 1.00 mg/dL   Calcium 9.0 8.9 - 10.3 mg/dL   GFR calc non Af Amer >60 >60 mL/min   GFR calc Af Amer >60 >60 mL/min    Comment: (NOTE) The eGFR has been calculated using the CKD EPI equation. This calculation has not been validated in all clinical situations. eGFR's persistently <60 mL/min signify possible Chronic Kidney Disease.    Anion gap 10 5 - 15     HEENT: left periorbital ecchymosis Cardio: RRR and no murmur Resp: CTA B/L and unlabored GI: BS positive and NT, ND Extremity:  Pulses positive and No Edema Skin:   Bruise left periorbital Neuro: Alert/Oriented, Normal Sensory, Abnormal Motor 2-/5 Left delt, bi, tri, grip, Left Hip/knee ext synergy, 0/5 at ankle and Abnormal FMC Ataxic/ dec [facility] Musc/Skel:  Swelling left dorsum of hand and wrist , no pain wit PROM, no point tenderness along the lumbosacral area Gen NAD   Assessment/Plan: 1. Functional deficits secondary to Right pontine infarct with left hemiparesis which require 3+ hours per day of interdisciplinary therapy in a  comprehensive inpatient rehab setting. Physiatrist is providing close team supervision and 24 hour management of active medical problems listed below. Physiatrist and rehab team continue to assess barriers to discharge/monitor patient progress toward functional and medical goals. FIM: Function - Bathing Position: Wheelchair/chair at sink Body parts bathed by patient: Left arm, Abdomen, Front perineal area, Right upper leg, Left upper leg Body parts bathed by helper: Right arm, Chest, Buttocks, Right lower leg, Left lower leg, Back Assist Level: Touching or steadying assistance(Pt > 75%)  Function- Upper Body Dressing/Undressing What is the patient wearing?: Pull over shirt/dress Pull over shirt/dress - Perfomed by patient: Thread/unthread right sleeve Pull over shirt/dress - Perfomed by helper: Thread/unthread left sleeve, Put head through opening, Pull shirt over trunk Assist Level: Touching or steadying assistance(Pt > 75%) Function - Lower Body Dressing/Undressing What is the patient wearing?: Pants, Non-skid slipper socks Position: Wheelchair/chair at sink Pants- Performed by patient: Thread/unthread right pants leg Pants- Performed by helper: Thread/unthread left pants leg, Pull pants up/down Non-skid slipper socks- Performed by helper: Don/doff right sock, Don/doff left sock Assist for footwear: Maximal assist Assist for lower body dressing: Touching or steadying assistance (Pt > 75%)  Function - Toileting Toileting steps completed by patient: Performs perineal hygiene Toileting steps completed by helper: Adjust clothing prior to toileting, Adjust clothing after toileting Toileting  Assistive Devices: Grab bar or rail Assist level: (max A)  Function - Air cabin crew transfer assistive device: Elevated toilet seat/BSC over toilet Assist level to toilet: Maximal assist (Pt 25 - 49%/lift and lower) Assist level from toilet: Maximal assist (Pt 25 - 49%/lift and  lower)  Function - Chair/bed transfer Chair/bed transfer method: Stand pivot Chair/bed transfer assist level: Maximal assist (Pt 25 - 49%/lift and lower) Chair/bed transfer assistive device: Armrests, Walker  Function - Locomotion: Wheelchair Type: Manual Max wheelchair distance: 63f Assist Level: Maximal assistance (Pt 25 - 49%) Wheel 50 feet with 2 turns activity did not occur: Safety/medical concerns Wheel 150 feet activity did not occur: Safety/medical concerns Turns around,maneuvers to table,bed, and toilet,negotiates 3% grade,maneuvers on rugs and over doorsills: No Function - Locomotion: Ambulation Ambulation activity did not occur: N/A Assistive device: Walker-rolling Max distance: 25 Assist level: Moderate assist (Pt 50 - 74%) Walk 10 feet activity did not occur: N/A Assist level: Moderate assist (Pt 50 - 74%) Walk 50 feet with 2 turns activity did not occur: N/A Walk 150 feet activity did not occur: N/A Walk 10 feet on uneven surfaces activity did not occur: N/A  Function - Comprehension Comprehension: Auditory Comprehension assistive device: Hearing aids Comprehension assist level: Follows basic conversation/direction with extra time/assistive device  Function - Expression Expression: Verbal Expression assist level: Expresses basic needs/ideas: With extra time/assistive device  Function - Social Interaction Social Interaction assist level: Interacts appropriately 75 - 89% of the time - Needs redirection for appropriate language or to initiate interaction.  Function - Problem Solving Problem solving assist level: Solves basic 75 - 89% of the time/requires cueing 10 - 24% of the time  Function - Memory Memory assist level: Recognizes or recalls 50 - 74% of the time/requires cueing 25 - 49% of the time Patient normally able to recall (first 3 days only): That he or she is in a hospital, Current season  Medical Problem List and Plan: 1.  Deficits with mobility,  self-care, problem solving secondary to right pontine stroke Cont CIR PT, OT,SLP 2.  DVT Prophylaxis/Anticoagulation: Pharmaceutical: Lovenox 3. Pain Management: Ice to left wrist and lower back. Continue Voltaren gel to help with myalgias.Add sportscreme to back      Encourage use of left wrist splint.  4. Mood: Team to provide ego support. Continue Remeron with prn Klonopin.   LCSW to follow for evaluation and support.  5. Neuropsych: This patient is capable of making decisions on her own behalf. 6. Skin/Wound Care: Routine pressure relief measures. Maintain adequate nutritional and hydration status.  7. Fluids/Electrolytes/Nutrition: Monitor I/O. only 2858myesterday BMET nl, 10-50% meals- discussed improtance of adequate fluid intake 8. B 12 deficiency: Continue Supplement 9. HTN: Monitor BP bid and monitor for orthostatic changes. Will  continue Norvasc for now. Monitor for  10. H/o of Iron deficiency anemia: Mild drop noted--add multivitamin with iron.  Baseline 12.2 , now stable at 11.3 cont to monitor, Negative stool occult blood 11. Dyslipidemia: On Lipitor 12.  Dysphagia Stroke- D2 thin, monitor intake and for signs of asp, SLP, hopefully can be upgraded soon as this may improve intake 13.  Dysarthria due to CVA, SLP eval 14.  Insomnia takes trazodone, remeron, tramadol and gabapentin at night  LOS (Days) 5 A FACE TO FACE EVALUATION WAS PERFORMED  AnCharlett Blake/11/2017, 8:34 AM

## 2017-05-14 NOTE — Progress Notes (Signed)
Physical Therapy Session Note  Patient Details  Name: Theresa Hampton MRN: [id number] Date of Birth: November 12, 1919  Today's Date: 05/14/2017 PT Individual Time: 1000-1045 and 1500-1530 PT Individual Time Calculation (min): 45 min  And 30 min  Short Term Goals: Week 1:  PT Short Term Goal 1 (Week 1): Pt will perform bed mobility with mod assist consistently  PT Short Term Goal 2 (Week 1): Pt will perform bed<>WC transfer with mod assist  PT Short Term Goal 3 (Week 1): Pt will ambulate 31f with max assist of 1 with LRAD  PT Short Term Goal 4 (Week 1): Pt will propell WC 1031fwith min assist   Skilled Therapeutic Interventions/Progress Updates: Tx1: Pt presented in bed agreeable to therapy. Performed supine to sit modA with cues for hand placement and truncal support. Performed sit to stand modA to adjust pants with manual facilitation of hips shifting anteriorly and noted L knee instability. Performed squat pivot to R modA and transported to rehab gym. Performed squat pivot to L modA, sit to/from stand x 4 with RW and hand splint. Performed seated dynamic activity with forced use of LUE reaching/tossing horseshoes. Also performed reaching low picking up cups with LUE for encouragement for flexing trunk forward and continued trunk control. Pt required intermittent rests due to fatigue throughout session. Performed squat pivot to R to w/c with maxA. Pt transported back to room and remained in w/c at end of session with call bell within reach and needs met.   Tx2: Pt presented in bed agreeable to therapy. Pt performed supine to sit modA and use of features. Performed squat pivot to w/c modA and transported to rehab gym for time management. Performed squat pivot to NuStep in same manner. Pt performed NuStep L2 x 8 min  For BLE strengthening and reciprocal movement with BLE supports for improved hip alignment. Pt returned to w/c via squat pivot in same manner and returned to room. Performed squat pivot to  bed maxA and mod/maxA sit to supine with cues for sequencing and maxA for LLE placement and repositioning. Pt in bed at end of session with alarm set and call bell within reach.      Therapy Documentation Precautions:  Precautions Precautions: Fall Precaution Comments: L painful wrist and saccum  Restrictions Weight Bearing Restrictions: No  See Function Navigator for Current Functional Status.   Therapy/Group: Individual Therapy  Jacynda Brunke  Isael Stille, PTA  05/14/2017, 1:08 PM

## 2017-05-14 NOTE — Progress Notes (Signed)
Occupational Therapy Session Note  Patient Details  Name: Theresa Hampton MRN: [id number] Date of Birth: [birth year]  Today's Date: 05/14/2017 OT Individual Time: 1100-1200 OT Individual Time Calculation (min): 60 min    Short Term Goals: Week 1:  OT Short Term Goal 1 (Week 1): Pt will transfer on/off BSC/toilet with mod A OT Short Term Goal 2 (Week 1): Pt will don shirt with min A OT Short Term Goal 3 (Week 1): Pt will don LB clothing including shoes with mod A OT Short Term Goal 4 (Week 1): Pt will perform sit to stand with mod A in prep for LB dressing  OT Short Term Goal 5 (Week 1): Pt will transfer into shower with mod A for bathing  Skilled Therapeutic Interventions/Progress Updates:    Pt seen for OT ADL bathing/dressing session. Pt sitting up in w/c upon arrival with prsonal care attendant present. Pt agreeable to bathing this session, opting to complete sponge bathing at sink.  Multimodal cuing and min A for doffing clothing using hemi technique. She bathed UB with hand over hand assist to use L UE to wash R UE, assist required due to decreased grasping ability and limited ROM, though pt was able to lift UE to place on sink ledge and assist with set-up. She stood at sink with mod-max A, required mod progressing to max A for dynamic standing balance due to poor R knee control/stability. Assist required to thread LEs into pants due to decreased hip flexion and VCs for attention to L UE during UB dressing. Pt able to brush hair with L UE with assist for stability at elbow and providing enough pressure to push through hair.  She completed mod A stand pivot transfer to recliner at end of session using RW.  Pt left seated in recliner at end of session with personal care attendant present assisting with lunch.   Therapy Documentation Precautions:  Precautions Precautions: Fall Precaution Comments: L painful wrist and saccum  Restrictions Weight Bearing Restrictions: No Pain: Pain  Assessment Pain Assessment: No/ denies pain ADL: ADL ADL Comments: see functional navigator  See Function Navigator for Current Functional Status.   Therapy/Group: Individual Therapy  Jahan Friedlander L 05/14/2017, 7:15 AM

## 2017-05-15 ENCOUNTER — Inpatient Hospital Stay (HOSPITAL_COMMUNITY): Payer: Medicare Other | Admitting: Occupational Therapy

## 2017-05-15 ENCOUNTER — Inpatient Hospital Stay (HOSPITAL_COMMUNITY): Payer: Medicare Other

## 2017-05-15 ENCOUNTER — Inpatient Hospital Stay (HOSPITAL_COMMUNITY): Payer: Medicare Other | Admitting: Physical Therapy

## 2017-05-15 NOTE — Progress Notes (Signed)
Speech Language Pathology Daily Session Note  Patient Details  Name: Theresa Hampton MRN: [id number] Date of Birth: 03/17/1920  Today's Date: 05/15/2017 SLP Individual Time: 0800-0900 SLP Individual Time Calculation (min): 60 min  Short Term Goals: Week 1: SLP Short Term Goal 1 (Week 1): Pt will consume least restrictive diet with Min A cues for compensatory swallow strategies.  SLP Short Term Goal 2 (Week 1): Given Min Acues, pt will utilize speech intelligibility strategies to achieve ~90% intelligibility at the sentence level.  SLP Short Term Goal 3 (Week 1): Given Min A cues, pt will demonstrate selective attention for ~ 15 minutes in mildly distracting environment.  SLP Short Term Goal 4 (Week 1): Pt will demonstrate increase awareness but answering [facility] safety questions with Min A cues.  SLP Short Term Goal 5 (Week 1): Pt will complete basic problem solving tasks with supervision cues.  Skilled Therapeutic Interventions: Skilled ST services focused on swallow and cognitive skills. SLP facilitated PO consumption of dys 2 and thin liquids, pt required min A verbal cues for swallow strategies and mild left side pocketing requiring lingual sweeps and liquid wash. Pt demonstrated no overt s/s aspiration. SLP facilitated problem solving skills given safety [place] questions and picture scenes, pt required Min A verbal cues for safety awareness given functional scenarios and providing corrective solutions. Pt demonstrated selective attention in mildly distracting environment with Min A verbal cues in 20 minute intervals.Pt demonstrated speech intelligibility in at sentence level in conversation with supervision A verbal cues required. Pt was left in room with daughter and call bell within reach. Recommend to continue skilled ST services.      Function:  Eating Eating   Modified Consistency Diet: Yes Eating Assist Level: Set up assist for;Supervision or verbal cues   Eating Set Up Assist For:  Opening containers       Cognition Comprehension Comprehension assist level: Follows basic conversation/direction with extra time/assistive device  Expression   Expression assist level: Expresses basic needs/ideas: With extra time/assistive device  Social Interaction Social Interaction assist level: Interacts appropriately 75 - 89% of the time - Needs redirection for appropriate language or to initiate interaction.  Problem Solving Problem solving assist level: Solves basic 75 - 89% of the time/requires cueing 10 - 24% of the time  Memory Memory assist level: Recognizes or recalls 75 - 89% of the time/requires cueing 10 - 24% of the time    Pain Pain Assessment Pain Assessment: No/denies pain Pain Score: 0-No pain  Therapy/Group: Individual Therapy  Awesome Jared  [facility] 05/15/2017, 12:21 PM

## 2017-05-15 NOTE — Progress Notes (Signed)
Subjective/Complaints:  Had pain L dorsum of foot after PT yesterday.  No prior hx Lef tfoot pain, no trauma to area.  Feels better this am, slept in PRAFO  ROS- no CP, SOB, no N/V/D  Objective: Vital Signs: Blood pressure (!) 143/51, pulse 85, temperature 98.2 F (36.8 C), temperature source Oral, resp. rate 18, height '5\' 5"'  (1.651 m), weight 57.2 kg (126 lb 1.7 oz), SpO2 97 %. No results found. No results found for this or any previous visit (from the past 72 hour(s)).   HEENT: left periorbital ecchymosis Cardio: RRR and no murmur Resp: CTA B/L and unlabored GI: BS positive and NT, ND Extremity:  Pulses positive and No Edema Skin:   Bruise left periorbital Neuro: Alert/Oriented, Normal Sensory, Abnormal Motor 2-/5 Left delt, bi, tri, grip, Left Hip/knee ext synergy, 0/5 at ankle and Abnormal FMC Ataxic/ dec [facility] Musc/Skel:  Swelling left dorsum of hand and wrist , no pain wit PROM, no point tenderness along the Left midfoot, Met heads, calcaneus ro achilles tendon, no swelling or erythema Gen NAD   Assessment/Plan: 1. Functional deficits secondary to Right pontine infarct with left hemiparesis which require 3+ hours per day of interdisciplinary therapy in a comprehensive inpatient rehab setting. Physiatrist is providing close team supervision and 24 hour management of active medical problems listed below. Physiatrist and rehab team continue to assess barriers to discharge/monitor patient progress toward functional and medical goals. FIM: Function - Bathing Position: Wheelchair/chair at sink Body parts bathed by patient: Left arm, Abdomen, Front perineal area, Right upper leg, Left upper leg, Right arm Body parts bathed by helper: Chest, Buttocks, Right lower leg, Left lower leg, Back Assist Level: Touching or steadying assistance(Pt > 75%)(50%, moderate assist)  Function- Upper Body Dressing/Undressing What is the patient wearing?: Pull over shirt/dress Pull over shirt/dress -  Perfomed by patient: Thread/unthread right sleeve, Put head through opening, Pull shirt over trunk, Thread/unthread left sleeve Pull over shirt/dress - Perfomed by helper: Thread/unthread left sleeve, Put head through opening, Pull shirt over trunk Assist Level: Touching or steadying assistance(Pt > 75%) Function - Lower Body Dressing/Undressing What is the patient wearing?: Pants, Non-skid slipper socks, Shoes Position: Wheelchair/chair at sink Pants- Performed by patient: Thread/unthread right pants leg Pants- Performed by helper: Thread/unthread left pants leg, Pull pants up/down Non-skid slipper socks- Performed by helper: Don/doff right sock, Don/doff left sock Shoes - Performed by helper: Don/doff right shoe, Don/doff left shoe Assist for footwear: Maximal assist Assist for lower body dressing: Touching or steadying assistance (Pt > 75%)  Function - Toileting Toileting activity did not occur: No continent bowel/bladder event Toileting steps completed by patient: Performs perineal hygiene Toileting steps completed by helper: Adjust clothing prior to toileting, Adjust clothing after toileting Toileting Assistive Devices: Grab bar or rail Assist level: (max A)  Function - Air cabin crew transfer assistive device: Elevated toilet seat/BSC over toilet Assist level to toilet: Maximal assist (Pt 25 - 49%/lift and lower) Assist level from toilet: Maximal assist (Pt 25 - 49%/lift and lower)  Function - Chair/bed transfer Chair/bed transfer method: Squat pivot Chair/bed transfer assist level: Moderate assist (Pt 50 - 74%/lift or lower) Chair/bed transfer assistive device: Armrests, Walker  Function - Locomotion: Wheelchair Type: Manual Max wheelchair distance: 34f Assist Level: Maximal assistance (Pt 25 - 49%) Wheel 50 feet with 2 turns activity did not occur: Safety/medical concerns Wheel 150 feet activity did not occur: Safety/medical concerns Turns around,maneuvers to  table,bed, and toilet,negotiates 3% grade,maneuvers on rugs and over  doorsills: No Function - Locomotion: Ambulation Ambulation activity did not occur: N/A Assistive device: Walker-rolling Max distance: 25 Assist level: Moderate assist (Pt 50 - 74%) Walk 10 feet activity did not occur: N/A Assist level: Moderate assist (Pt 50 - 74%) Walk 50 feet with 2 turns activity did not occur: N/A Walk 150 feet activity did not occur: N/A Walk 10 feet on uneven surfaces activity did not occur: N/A  Function - Comprehension Comprehension: Auditory Comprehension assistive device: Hearing aids Comprehension assist level: Follows basic conversation/direction with extra time/assistive device  Function - Expression Expression: Verbal Expression assist level: Expresses basic needs/ideas: With extra time/assistive device  Function - Social Interaction Social Interaction assist level: Interacts appropriately 75 - 89% of the time - Needs redirection for appropriate language or to initiate interaction.  Function - Problem Solving Problem solving assist level: Solves basic 75 - 89% of the time/requires cueing 10 - 24% of the time, Solves basic 50 - 74% of the time/requires cueing 25 - 49% of the time(With use of compensatory swallow strategies)  Function - Memory Memory assist level: Recognizes or recalls 75 - 89% of the time/requires cueing 10 - 24% of the time Patient normally able to recall (first 3 days only): That he or she is in a hospital, Current season, Staff names and faces  Medical Problem List and Plan: 1.  Deficits with mobility, self-care, problem solving secondary to right pontine stroke Cont CIR PT, OT,SLP team conf in am 2.  DVT Prophylaxis/Anticoagulation: Pharmaceutical: Lovenox 3. Pain Management: Ice to left wrist and lower back. Continue Voltaren gel to help with myalgias.Add sportscreme to back      Encourage use of left wrist splint.  4. Mood: Team to provide ego support. Continue  Remeron with prn Klonopin.   LCSW to follow for evaluation and support.  5. Neuropsych: This patient is capable of making decisions on her own behalf. 6. Skin/Wound Care: Routine pressure relief measures. Maintain adequate nutritional and hydration status.  7. Fluids/Electrolytes/Nutrition: Monitor I/O. I 312m on 1/7 check BMET in am 8. B 12 deficiency: Continue Supplement 9. HTN: Monitor BP bid and monitor for orthostatic changes. Will  continue Norvasc for now. Monitor for  10. H/o of Iron deficiency anemia: Mild drop noted--add multivitamin with iron.  Baseline 12.2 , now stable at 11.3 cont to monitor, Negative stool occult blood 11. Dyslipidemia: On Lipitor 12.  Dysphagia Stroke- D2 thin, monitor intake and for signs of asp, SLP, hopefully can be upgraded soon as this may improve intake 13.  Dysarthria due to CVA, SLP eval 14.  Insomnia takes trazodone, remeron, tramadol and gabapentin at night  LOS (Days) 6 A FACE TO FACE EVALUATION WAS PERFORMED  ACharlett Blake1/12/2017, 7:48 AM

## 2017-05-15 NOTE — Progress Notes (Signed)
Physical Therapy Session Note  Patient Details  Name: Theresa Hampton MRN: [id number] Date of Birth: [birth year]  Today's Date: 05/15/2017 PT Individual Time: 1400-1500 PT Individual Time Calculation (min): 60 min   Short Term Goals: Week 1:  PT Short Term Goal 1 (Week 1): Pt will perform bed mobility with mod assist consistently  PT Short Term Goal 2 (Week 1): Pt will perform bed<>WC transfer with mod assist  PT Short Term Goal 3 (Week 1): Pt will ambulate 64ft with max assist of 1 with LRAD  PT Short Term Goal 4 (Week 1): Pt will propell WC 160ft with min assist   Skilled Therapeutic Interventions/Progress Updates: Pt presented in bed agreeable to therapy. Performed supine to sit modA with improved use of BLE for positioning and requiring assist for truncal support. Pt required modA and multimodal cues for scooting to EOB with tactile cues for lateral shifting. Pt indicating urgency to voiding. Brought out [facility] and performed stand pivot with RW to [facility]. Pt required modA for stand pivot with cues for safety with RW. Pt required total assist for clothing management (+void). Pt performed stand pivot transfer to w/c to R. Pt required modA for sit to stand and mod/maxA for transfer with max cues for maintaining hips anteriorly and safe placement prior to sitting. Transported pt to rehab gym and initiated w/c mobility. Pt practiced w/c mobility hemi technique 76ft x 2 with modA and max multimodal cues for use of LUE. Pt returned to room and agreeable to remain in w/c at end of session with call bell within reach and NT present for vitals check.       Therapy Documentation Precautions:  Precautions Precautions: Fall Precaution Comments: L painful wrist and saccum  Restrictions Weight Bearing Restrictions: No General:   Vital Signs: Therapy Vitals Temp: 98.5 F (36.9 C) Temp Source: Oral Pulse Rate: 87 Resp: 18 BP: 131/81 Patient Position (if appropriate): Sitting Oxygen Therapy SpO2: 97  % O2 Device: Not Delivered Pain: Pain Assessment Pain Score: 0-No pain  See Function Navigator for Current Functional Status.   Therapy/Group: Individual Therapy  Noya Santarelli  Lynda Wanninger, PTA  05/15/2017, 3:36 PM

## 2017-05-15 NOTE — Progress Notes (Signed)
Occupational Therapy Session Note  Patient Details  Name: Theresa Hampton MRN: [id number] Date of Birth: [birth year]  Today's Date: 05/15/2017 OT Individual Time: 2025-4270 OT Individual Time Calculation (min): 70 min    Short Term Goals: Week 1:  OT Short Term Goal 1 (Week 1): Pt will transfer on/off BSC/toilet with mod A OT Short Term Goal 2 (Week 1): Pt will don shirt with min A OT Short Term Goal 3 (Week 1): Pt will don LB clothing including shoes with mod A OT Short Term Goal 4 (Week 1): Pt will perform sit to stand with mod A in prep for LB dressing  OT Short Term Goal 5 (Week 1): Pt will transfer into shower with mod A for bathing  Skilled Therapeutic Interventions/Progress Updates:    Pt seen for OT ADL bathing/dressing session. Pt sitting up in bed upon arrival, agreeable to tx session. She transferred to EOB with VCs for technique and assist to manage L LE off EOB. Completed stand pivot transfer to w/c using RW with hand splint. Min A to stand from EOB, however, required increased assist for positioning and controlled sit into w/c as pt fatigued during transfers. Completed bathing/dressing from w/c level at sink per pt request. Required VCs to recall hemi dressing technique and assist to implement with decreased attention to L side of body when dressing. She stood at sink with mod A and mod A for dynamic standing balance with B UE support on sink ledge. She completed stand pivot toilet transfer with cuing for sequencing and max A for positioning of hips over toilet. Pt noted with posterior lean when sitting unsupported on toilet, VCs for anterior weight shift.  Pt voicing increased fatigue towards end of session and desiring to return back to bed. Max A squat pivot back to bed at end of session. Pt left in supine with all needs in reach and bed alarm on.   Therapy Documentation Precautions:  Precautions Precautions: Fall Precaution Comments: L painful wrist and saccum   Restrictions Weight Bearing Restrictions: No Pain:   no/denies pain ADL: ADL ADL Comments: see functional navigator  See Function Navigator for Current Functional Status.   Therapy/Group: Individual Therapy  Jacion Dismore L 05/15/2017, 7:07 AM

## 2017-05-16 ENCOUNTER — Inpatient Hospital Stay (HOSPITAL_COMMUNITY): Payer: Medicare Other | Admitting: Physical Therapy

## 2017-05-16 ENCOUNTER — Inpatient Hospital Stay (HOSPITAL_COMMUNITY): Payer: Medicare Other | Admitting: Speech Pathology

## 2017-05-16 ENCOUNTER — Inpatient Hospital Stay (HOSPITAL_COMMUNITY): Payer: Medicare Other | Admitting: Occupational Therapy

## 2017-05-16 ENCOUNTER — Inpatient Hospital Stay (HOSPITAL_COMMUNITY): Payer: Medicare Other | Admitting: *Deleted

## 2017-05-16 ENCOUNTER — Other Ambulatory Visit: Payer: Self-pay

## 2017-05-16 LAB — BASIC METABOLIC PANEL
Anion gap: 9 (ref 5–15)
BUN: 11 mg/dL (ref 6–20)
CO2: 27 mmol/L (ref 22–32)
Calcium: 9.2 mg/dL (ref 8.9–10.3)
Chloride: 101 mmol/L (ref 101–111)
Creatinine, Ser: 0.67 mg/dL (ref 0.44–1.00)
GFR calc Af Amer: >60 mL/min (ref >60)
GLUCOSE: 150 mg/dL — AB (ref 65–99)
Potassium: 3.8 mmol/L (ref 3.5–5.1)
Sodium: 137 mmol/L (ref 135–145)

## 2017-05-16 LAB — CBC
HEMATOCRIT: 36.4 % (ref 36.0–46.0)
Hemoglobin: 11.4 g/dL — ABNORMAL LOW (ref 12.0–15.0)
MCH: 27.9 pg (ref 26.0–34.0)
MCHC: 31.3 g/dL (ref 30.0–36.0)
MCV: 89.2 fL (ref 78.0–100.0)
Platelets: 324 10*3/uL (ref 150–400)
RBC: 4.08 MIL/uL (ref 3.87–5.11)
RDW: 12.6 % (ref 11.5–15.5)
WBC: 9.5 10*3/uL (ref 4.0–10.5)

## 2017-05-16 MED ORDER — TUBERCULIN PPD 5 UNIT/0.1ML ID SOLN
5.0000 [IU] | Freq: Once | INTRADERMAL | Status: AC
  Administered 2017-05-16: 5 [IU] via INTRADERMAL
  Filled 2017-05-16 (×3): qty 0.1

## 2017-05-16 NOTE — Progress Notes (Signed)
Recreational Therapy Session Note  Patient Details  Name: Theresa Hampton MRN: [id number] Date of Birth: Aug 08, 1919 Today's Date: 05/16/2017  Pain: no c/o  Met with pt today to discuss TR services.  Pt describes being fairly sedentary PTA.  Pt with low activity tolerance, full eval deferred. [place] 05/16/2017, 3:15 PM

## 2017-05-16 NOTE — Patient Care Conference (Signed)
Inpatient RehabilitationTeam Conference and Plan of Care Update Date: 05/16/2017   Time: 10:45 AM    Patient Name: Theresa Hampton      Medical Record Number: [id number]  Date of Birth: 10/28/1919 Sex: Female         Room/Bed: 4W24C/4W24C-01 Payor Info: Payor: MEDICARE / Plan: MEDICARE PART A AND B / Product Type: *No Product type* /    Admitting Diagnosis: Rt CVA  Admit Date/Time:  05/09/2017  3:14 PM Admission Comments: No comment available   Primary Diagnosis:  <principal problem not specified> Principal Problem: <principal problem not specified>  Patient Active Problem List   Diagnosis Date Noted  . Right pontine stroke ([place]) 05/09/2017  . Tenosynovitis   . Benign essential HTN   . Chronic kidney disease   . Acute blood loss anemia   . Left wrist pain   . Dysphagia, post-stroke   . Cognitive deficit, post-stroke   . Acute left-sided weakness   . Facial droop 05/06/2017  . Stroke (cerebrum) ([place]) 05/06/2017  . Fatigue 07/29/2015  . Acute sinusitis 03/10/2015  . OA (osteoarthritis) of hip 01/06/2015  . Preop exam for internal medicine 12/16/2014  . HTN (hypertension) 06/09/2014  . Grief 06/09/2014  . Hypokalemia 06/05/2014  . Menopausal disorder 04/07/2014  . Acute pyelonephritis 04/09/2013  . Generalized anxiety disorder 04/09/2013  . Nausea alone 04/07/2013  . Tachycardia 04/07/2013  . Abdominal bloating 04/07/2013  . Neuropathic pain 11/20/2012  . URI, acute 08/21/2012  . Hip pain, left 11/17/2011  . Wrist pain, right 10/27/2010  . Weakness of both legs 10/27/2010  . Cramp of limb 07/09/2009  . B12 deficiency 09/16/2008  . EYE PAIN 02/07/2008  . Sinusitis, chronic 02/07/2008  . Allergic rhinitis 08/06/2007  . Left carotid bruit 08/06/2007  . Vitamin D deficiency 04/26/2007  . Peptic ulcer 04/26/2007  . Osteoarthritis 04/26/2007  . Headache 04/26/2007  . Hyperlipidemia 01/11/2007  . ANEMIA-IRON DEFICIENCY 01/11/2007  . GASTRIC ULCER, ACUTE, HEMORRHAGE  01/11/2007  . DUODENITIS 01/11/2007    Expected Discharge Date: Expected Discharge Date: 05/29/17  Team Members Present: Physician leading conference: Dr. Alysia Penna Social Worker Present: Ovidio Kin, LCSW Nurse Present: Arelia Sneddon, RN PT Present: Phylliss Bob, PTA;Barrie Folk, PT OT Present: Napoleon Form, OT SLP Present: Stormy Fabian, SLP PPS Coordinator present : Daiva Nakayama, RN, CRRN     Current Status/Progress Goal Weekly Team Focus  Medical   low endurance, BPs variable  reduce fall risk and recurrent CVA risk  BP and pain management   Bowel/Bladder   continent of b/b, LBM--1/4 after supp  maintain b/b with mod assist  monitor b/b q shift and prn   Swallow/Nutrition/ Hydration   Supervision dys 2 and thin, trials dys3 Mod A  Mod I  carryover swallow strategies, trials of dys 3   ADL's   Mod A stand pivot transfers; max A LB dressing; min A UB dressing; supervision grooming  Min A overall  Activity tolerance, R Neuro re-ed, ADL re-training, functional transfers   Mobility   modA bed mobility, modA squat pivot, mod/maxA stand pivot with RW, gait 8ft with RW maxA  min assist overall  L NMR, standing balance, gait, transfers,    Communication   Min- Supervision A  Supervision A  carryover of stratgeies in conversation /sentence level   Safety/Cognition/ Behavioral Observations  Min A  Supervision  selective attention, problem solving, safety awareness and anticipatory awareness   Pain   scheduled tramadol and prn for back pain  maintain pain <2 with mod assist  monitor q shift and prn   Skin   bruises to left face/left arm, healing, no other skin issues  maintain skin with mod assist  monitor skin q shift and prn      *See Care Plan and progress notes for long and short-term goals.     Barriers to Discharge  Current Status/Progress Possible Resolutions Date Resolved   Physician    Other (comments)  prior hx of recurrent falls and fracture  pain  control imprving  Cont CIR, pain management      Nursing                  PT                    OT                  SLP (P) Decreased caregiver support (P) Pt will need hired caregivers 24 hours per day            SW                Discharge Planning/Teaching Needs:  Home with family and hired caregivers providing 24 hr care. Daughter's have been here daily but not sure if completely understand pt's care. Theresa Hampton does understand pt's care needs.      Team Discussion:  Goals min assist level. Back pain managed with tramadol. Dys 2 thin trials of Dys 3 today with speech. Working on balance, attention, and safety awareness. Fatigues easily therapy team building in rest breaks each day. Poor po intake MD watching and monitoring. Family aware will need 24 hr care upon discharge. Pursuing options.  Revisions to Treatment Plan:  DC 1/22    Continued Need for Acute Rehabilitation Level of Care: The patient requires daily medical management by a physician with specialized training in physical medicine and rehabilitation for the following conditions: Daily direction of a multidisciplinary physical rehabilitation program to ensure safe treatment while eliciting the highest outcome that is of practical value to the patient.: Yes Daily medical management of patient stability for increased activity during participation in an intensive rehabilitation regime.: Yes Daily analysis of laboratory values and/or radiology reports with any subsequent need for medication adjustment of medical intervention for : Neurological problems;Other  Theresa Hampton 05/17/2017, 12:42 PM

## 2017-05-16 NOTE — Progress Notes (Signed)
Speech Language Pathology Daily Session Note  Patient Details  Name: Theresa Hampton MRN: [id number] Date of Birth: [birth year]  Today's Date: 05/16/2017 SLP Individual Time: 0830-0930 SLP Individual Time Calculation (min): 60 min  Short Term Goals: Week 1: SLP Short Term Goal 1 (Week 1): Pt will consume least restrictive diet with Min A cues for compensatory swallow strategies.  SLP Short Term Goal 2 (Week 1): Given Min Acues, pt will utilize speech intelligibility strategies to achieve ~90% intelligibility at the sentence level.  SLP Short Term Goal 3 (Week 1): Given Min A cues, pt will demonstrate selective attention for ~ 15 minutes in mildly distracting environment.  SLP Short Term Goal 4 (Week 1): Pt will demonstrate increase awareness but answering [facility] safety questions with Min A cues.  SLP Short Term Goal 5 (Week 1): Pt will complete basic problem solving tasks with supervision cues.  Skilled Therapeutic Interventions: Skilled treatment session focused on dysphagia and cognition goals. SLP facilitated session by providing intermittent supervision cues for rate and clearance of left buccal cavity. Pt with much improved self-awareness and self-monitoring of oral residue. SLP further facilitated session by providing skilled observation of pt consuming graham crackers. Pt with increased lingual manipulation of bolus and good ability to clear oral residue before placing next bite. Given pt's increased oral ability, recommend trial dysphagia 3 tray during next available ST session. SLP also facilitated session by providing supervision level reminders for selective attention for ~ 45 minutes. Pt with noted improvement in selective attention. Pt was left upright in bed with both daughters present. Continue per current plan of care.      Function:  Eating Eating   Modified Consistency Diet: Yes Eating Assist Level: Set up assist for;Supervision or verbal cues   Eating Set Up Assist For:  Opening containers       Cognition Comprehension Comprehension assist level: Follows basic conversation/direction with extra time/assistive device  Expression   Expression assist level: Expresses basic needs/ideas: With extra time/assistive device  Social Interaction Social Interaction assist level: Interacts appropriately 90% of the time - Needs monitoring or encouragement for participation or interaction.  Problem Solving Problem solving assist level: Solves basic 90% of the time/requires cueing < 10% of the time  Memory Memory assist level: Recognizes or recalls 75 - 89% of the time/requires cueing 10 - 24% of the time    Pain    Therapy/Group: Individual Therapy  Kameelah Minish 05/16/2017, 10:08 AM

## 2017-05-16 NOTE — Progress Notes (Signed)
Occupational Therapy Session Note  Patient Details  Name: Theresa Hampton MRN: [id number] Date of Birth: 1920-04-13  Today's Date: 05/16/2017 OT Individual Time: 1100-1203 OT Individual Time Calculation (min): 63 min    Short Term Goals: Week 1:  OT Short Term Goal 1 (Week 1): Pt will transfer on/off BSC/toilet with mod A OT Short Term Goal 2 (Week 1): Pt will don shirt with min A OT Short Term Goal 3 (Week 1): Pt will don LB clothing including shoes with mod A OT Short Term Goal 4 (Week 1): Pt will perform sit to stand with mod A in prep for LB dressing  OT Short Term Goal 5 (Week 1): Pt will transfer into shower with mod A for bathing  Skilled Therapeutic Interventions/Progress Updates:    Pt seen for OT session focusing on ADL re-training and per pt and pt's family request, desiring to work on hand witting. Pt sitting up in w/c upon arrival, voiced increased fatigued but willing to attempt therapy. She declined full bathing/dressing opting just to complete grooming tasks from w/c level at sink. Provided min hand over hand assist to use L UE to brush hair as pt unable to apply enough force to complete task functionally though demonstrates full ROM required for task.  Completed hand writing task trialing various adaptive measures. Pt voiced liked red built up grippe placed on pen best and improved performance with L elbow supported on table.  Pt then completed fine motor activities using foam blocks focusing on fine and gross grasping ability. Provided with written hand out of exercises for pt to complete outside of tx sessions per pt's family request. Pt returned to room at end of session, left seated in w/c with all needs in reach and caregiver present.  Reviewed d/c planning with pt's daughter who was present at beginning of session, made aware of pt's need for 24 hr assist at d/c.   Therapy Documentation Precautions:  Precautions Precautions: Fall Precaution Comments: L painful wrist  and saccum  Restrictions Weight Bearing Restrictions: No Pain:   No/denies pain ADL: ADL ADL Comments: see functional navigator  See Function Navigator for Current Functional Status.   Therapy/Group: Individual Therapy  Daquavion Catala L 05/16/2017, 7:13 AM

## 2017-05-16 NOTE — Progress Notes (Signed)
  Physical Therapy Session Note  Patient Details  Name: Theresa Hampton MRN: [id number] Date of Birth: 12/21/1919  Today's Date: 05/16/2017 PT Individual Time: 1300-1415 PT Individual Time Calculation (min): 75 min   Short Term Goals: Week 1:  PT Short Term Goal 1 (Week 1): Pt will perform bed mobility with mod assist consistently  PT Short Term Goal 2 (Week 1): Pt will perform bed<>WC transfer with mod assist  PT Short Term Goal 3 (Week 1): Pt will ambulate 47ft with max assist of 1 with LRAD  PT Short Term Goal 4 (Week 1): Pt will propell WC 1105ft with min assist   Skilled Therapeutic Interventions/Progress Updates: Pt presented in bed agreeable to therapy. Pt performed supine to sit min guard with increased time and use of features. Pt able to scoot to EOB with min guard and increased time with verbal cues for increasing lateral shifting for "hip walking" to EOB. Pt performed stand pivot to w/c modA with cues for sequencing. Pt transported to rehab gym and performed gait training 37ft x 1, 13ft x1 with modA, cues for step length and maintaining erect posture. Pt continues to demonstrate significant genu varus with ambulation. Participated in blocked practice stand pivot transfers from standard chair to w/c with multimodal cues for R hand placement, BLE placement, and technique. Pt required increased cues for achieving BLE touching chair and placing RLE on chair prior ro descending into chair. Pt continues to require reinforcement due to decreased carryover. Performed LNMR via forced use of LLE, performed LAQ with PTA plaecing bolster between knees and tactile cues for neutral hip with activity. Also performed seated marches with emphasis on L hip flexion, and B hip er in sitting. Pt returned to room total assist and initiated stand pivot to bed. Upon standing pt indicating urgency for urinary void. Pt ambulated to toilet and transferred to toilet modA with PTA providing total assist for clothing  management. Pt left at toilet with caregiver present and NT notified.      Therapy Documentation Precautions:  Precautions Precautions: Fall Precaution Comments: L painful wrist and saccum  Restrictions Weight Bearing Restrictions: No General:   Vital Signs: Therapy Vitals Temp: 98.1 F (36.7 C) Temp Source: Oral Pulse Rate: 97 Resp: 18 BP: (!) 141/62 Patient Position (if appropriate): Lying Oxygen Therapy SpO2: 97 % O2 Device: Not Delivered  See Function Navigator for Current Functional Status.   Therapy/Group: Individual Therapy  Delara Shepheard  Tressie Ragin, PTA  05/16/2017, 4:17 PM

## 2017-05-16 NOTE — Progress Notes (Signed)
Tuberculin injection given in right forearm, area circled. To be read on 05/18/2017

## 2017-05-16 NOTE — Progress Notes (Signed)
Social Work Patient ID: Theresa Hampton, female   DOB: 10-Jun-1919, 84 y.o.   MRN: [id number]  Met with caregiver, two daughter's and son in-law to inform team conference goals min assist level and target discharge 1/22. Daughter's have informed worker they have placed a deposit at [facility] for pt to go there upon discharge from rehab. They have not discussed with pt yet, this was strongly encouraged due to she will need to agree and will be the one paying for it. They plan to do this today. They will bring in paperwork to be completed and will need a TB test, have asked [employer] to write an order for one. Will touch base with pt later this afternoon to allow daughter's to talk with her first.

## 2017-05-16 NOTE — Progress Notes (Signed)
Subjective/Complaints:  Some back pain last noc  ROS- no CP, SOB, no N/V/D  Objective: Vital Signs: Blood pressure 130/61, pulse 78, temperature 98 F (36.7 C), temperature source Oral, resp. rate 18, height 5' 5" (1.651 m), weight 57.3 kg (126 lb 5.2 oz), SpO2 95 %. No results found. No results found for this or any previous visit (from the past 72 hour(s)).   HEENT: left periorbital ecchymosis Cardio: RRR and no murmur Resp: CTA B/L and unlabored GI: BS positive and NT, ND Extremity:  Pulses positive and No Edema Skin:   Bruise left periorbital Neuro: Alert/Oriented, Normal Sensory, Abnormal Motor 2-/5 Left delt, bi, tri, grip, Left Hip/knee ext synergy, 0/5 at ankle and Abnormal FMC Ataxic/ dec FMC Musc/Skel:  Swelling left dorsum of hand and wrist , no pain wit PROM, no point tenderness along the Left midfoot, Met heads, calcaneus ro achilles tendon, no swelling or erythema Gen NAD   Assessment/Plan: 1. Functional deficits secondary to Right pontine infarct with left hemiparesis which require 3+ hours per day of interdisciplinary therapy in a comprehensive inpatient rehab setting. Physiatrist is providing close team supervision and 24 hour management of active medical problems listed below. Physiatrist and rehab team continue to assess barriers to discharge/monitor patient progress toward functional and medical goals. FIM: Function - Bathing Position: Wheelchair/chair at sink Body parts bathed by patient: Left arm, Abdomen, Front perineal area, Right upper leg, Left upper leg, Right arm, Chest Body parts bathed by helper: Buttocks, Right lower leg, Left lower leg, Back Assist Level: Touching or steadying assistance(Pt > 75%)  Function- Upper Body Dressing/Undressing What is the patient wearing?: Pull over shirt/dress Pull over shirt/dress - Perfomed by patient: Thread/unthread right sleeve, Put head through opening, Pull shirt over trunk, Thread/unthread left sleeve Pull  over shirt/dress - Perfomed by helper: Thread/unthread left sleeve, Put head through opening, Pull shirt over trunk Assist Level: Touching or steadying assistance(Pt > 75%) Function - Lower Body Dressing/Undressing What is the patient wearing?: Pants, Shoes, Socks Position: Wheelchair/chair at sink Pants- Performed by patient: Thread/unthread right pants leg Pants- Performed by helper: Thread/unthread right pants leg, Thread/unthread left pants leg, Pull pants up/down Non-skid slipper socks- Performed by helper: Don/doff right sock, Don/doff left sock Socks - Performed by helper: Don/doff right sock, Don/doff left sock Shoes - Performed by helper: Don/doff right shoe, Don/doff left shoe Assist for footwear: Maximal assist Assist for lower body dressing: Touching or steadying assistance (Pt > 75%)  Function - Toileting Toileting activity did not occur: No continent bowel/bladder event Toileting steps completed by patient: Performs perineal hygiene Toileting steps completed by helper: Adjust clothing prior to toileting, Adjust clothing after toileting Toileting Assistive Devices: Grab bar or rail Assist level: (max A)  Function - Toilet Transfers Toilet transfer assistive device: Elevated toilet seat/BSC over toilet Assist level to toilet: Maximal assist (Pt 25 - 49%/lift and lower) Assist level from toilet: Maximal assist (Pt 25 - 49%/lift and lower)  Function - Chair/bed transfer Chair/bed transfer method: Squat pivot Chair/bed transfer assist level: Moderate assist (Pt 50 - 74%/lift or lower) Chair/bed transfer assistive device: Armrests, Walker  Function - Locomotion: Wheelchair Type: Manual Max wheelchair distance: 30ft Assist Level: Maximal assistance (Pt 25 - 49%) Wheel 50 feet with 2 turns activity did not occur: Safety/medical concerns Wheel 150 feet activity did not occur: Safety/medical concerns Turns around,maneuvers to table,bed, and toilet,negotiates 3% grade,maneuvers  on rugs and over doorsills: No Function - Locomotion: Ambulation Ambulation activity did not occur: N/A Assistive   device: Walker-rolling Max distance: 25 Assist level: Moderate assist (Pt 50 - 74%) Walk 10 feet activity did not occur: N/A Assist level: Moderate assist (Pt 50 - 74%) Walk 50 feet with 2 turns activity did not occur: N/A Walk 150 feet activity did not occur: N/A Walk 10 feet on uneven surfaces activity did not occur: N/A  Function - Comprehension Comprehension: Auditory Comprehension assistive device: Hearing aids Comprehension assist level: Follows basic conversation/direction with extra time/assistive device  Function - Expression Expression: Verbal Expression assist level: Expresses basic needs/ideas: With extra time/assistive device  Function - Social Interaction Social Interaction assist level: Interacts appropriately 75 - 89% of the time - Needs redirection for appropriate language or to initiate interaction.  Function - Problem Solving Problem solving assist level: Solves basic 75 - 89% of the time/requires cueing 10 - 24% of the time  Function - Memory Memory assist level: Recognizes or recalls 75 - 89% of the time/requires cueing 10 - 24% of the time Patient normally able to recall (first 3 days only): That he or she is in a hospital, Current season, Staff names and faces  Medical Problem List and Plan: 1.  Deficits with mobility, self-care, problem solving secondary to right pontine stroke Cont CIR PT, OT,SLP Team conference today please see physician documentation under team conference tab, met with team face-to-face to discuss problems,progress, and goals. Formulized individual treatment plan based on medical history, underlying problem and comorbidities. 2.  DVT Prophylaxis/Anticoagulation: Pharmaceutical: Lovenox 3. Pain Management: Ice to left wrist and lower back. Continue Voltaren gel to help with myalgias.Add sportscreme to back      Encourage use of  left wrist splint.  4. Mood: Team to provide ego support. Continue Remeron with prn Klonopin.   LCSW to follow for evaluation and support.  5. Neuropsych: This patient is capable of making decisions on her own behalf. 6. Skin/Wound Care: Routine pressure relief measures. Maintain adequate nutritional and hydration status.  7. Fluids/Electrolytes/Nutrition: Monitor I/O. I 393m on 1/8 , BMET pending 8. B 12 deficiency: Continue Supplement 9. HTN: Monitor BP bid and monitor for orthostatic changes. Will  continue Norvasc for now. Monitor for  Vitals:   05/15/17 1503 05/16/17 0545  BP: 131/81 130/61  Pulse: 87 78  Resp: 18 18  Temp: 98.5 F (36.9 C) 98 F (36.7 C)  SpO2: 97% 95%  Controlled 1/9                                                                                                                                              10. H/o of Iron deficiency anemia: Mild drop noted--add multivitamin with iron.  Baseline 12.2 , now stable at 11.3 cont to monitor, Negative stool occult blood 11. Dyslipidemia: On Lipitor 12.  Dysphagia Stroke- D2 thin, monitor intake and for signs of asp, SLP, hopefully can be upgraded soon as  this may improve intake 13.  Dysarthria due to CVA, SLP eval 14.  Insomnia takes trazodone, remeron, tramadol and gabapentin at night  LOS (Days) 7 A FACE TO FACE EVALUATION WAS PERFORMED  Charlett Blake 05/16/2017, 9:05 AM

## 2017-05-17 ENCOUNTER — Inpatient Hospital Stay (HOSPITAL_COMMUNITY): Payer: Medicare Other | Admitting: Speech Pathology

## 2017-05-17 ENCOUNTER — Inpatient Hospital Stay (HOSPITAL_COMMUNITY): Payer: Medicare Other | Admitting: Physical Therapy

## 2017-05-17 ENCOUNTER — Encounter (HOSPITAL_COMMUNITY): Payer: Self-pay | Admitting: Emergency Medicine

## 2017-05-17 ENCOUNTER — Inpatient Hospital Stay (HOSPITAL_COMMUNITY): Payer: Medicare Other | Admitting: Occupational Therapy

## 2017-05-17 NOTE — Plan of Care (Signed)
  RH KNOWLEDGE DEFICIT RH STG INCREASE KNOWLEDGE OF DYSPHAGIA/FLUID INTAKE Description Mod assistance increase knowledge fo dysphagia/fluid intake  05/17/2017 1527 - Not Progressing by Blinda Leatherwood, RN  Pt was tearful this morning after finding out she will be going to a nursing home. She did not understand why she could not go home. Family member at bedside noted that MD, informed the pt of disposition.

## 2017-05-17 NOTE — Progress Notes (Signed)
Occupational Therapy Weekly Progress Note  Patient Details  Name: Theresa Hampton MRN: [id number] Date of Birth: 31-Aug-1919  Beginning of progress report period: May 10, 2017 End of progress report period: May 17, 2017  Today's Date: 05/17/2017 OT Individual Time: 1045-1200 OT Individual Time Calculation (min): 75 min    Patient has met 3 of 4 short term goals.  Pt is making slow but steady progress towards OT goals. She cont to be most limited by LE weakness, requiring mod-max A for sit<>stand, R UE ataxia and overall deconditioning/ generalized weakness. She requires significantly increased time and rest breaks to complete all tasks.  Family has decided for pt to d/c to ALF facility following IPR admission. Goals of overall min A remain appropriate at this time.   Patient continues to demonstrate the following deficits: acute pain, apraxia, hemiplegia affecting non-dominant side and muscle weakness (generalized) and therefore will continue to benefit from skilled OT intervention to enhance overall performance with BADL and Reduce care partner burden.  Patient progressing toward long term goals..  Continue plan of care.  OT Short Term Goals Week 1:  OT Short Term Goal 1 (Week 1): Pt will transfer on/off BSC/toilet with mod A OT Short Term Goal 1 - Progress (Week 1): Met OT Short Term Goal 2 (Week 1): Pt will don shirt with min A OT Short Term Goal 2 - Progress (Week 1): Met OT Short Term Goal 3 (Week 1): Pt will don LB clothing including shoes with mod A OT Short Term Goal 3 - Progress (Week 1): Not met OT Short Term Goal 4 (Week 1): Pt will perform sit to stand with mod A in prep for LB dressing  OT Short Term Goal 4 - Progress (Week 1): Met OT Short Term Goal 5 (Week 1): Pt will transfer into shower with mod A for bathing OT Short Term Goal 5 - Progress (Week 1): Discontinued (comment)(d/c as pt did not shower PTA, therefore have not addressed this goal) Week 2:  OT Short  Term Goal 1 (Week 2): Pt will complete 2/3 toileting task with mod steadying assist in order to decrease caregiver burden OT Short Term Goal 2 (Week 2): Pt will stand at sink to complete 2 grooming tasks in order to increase functional activity tolerance OT Short Term Goal 3 (Week 2): Pt will complete LB dressing with mod A OT Short Term Goal 4 (Week 2): Pt will use L UE at non-dominant level during grooming tasks with no more than 2 VCs for initiation  Skilled Therapeutic Interventions/Progress Updates:    Pt seen for OT session focusing on ADL re-training, functional ambulation, and transfers. Pt sitting up in bed upon arrival with personal caregiver present, voicing need for toileting task. She transferred to EOB using hospital bed functions with VCs for technique. She stood from EOB with min A and ambulated into bathroom with RW and min-occasional mod A. She required total A for clothing management due to poor standing balance and poor standing tolerance. She voiced dizziness upon return to w/c, BP 132/67 and reported feeling better following seated rest break. She completed grooming and UB dressing tasks from w/c level at sink, multimodal cuing to initiate use of L UE into functional task. Min stabilizing assist at elbow provided for pt to brush  Hair and reach top of head, able to maintain functional grasp on brush throughout task though requiring frequent rest breaks.  Completed sit<>stand trials at sink, pt standing from w/c with min A  and VCs for hand placement. Pt able to stand to wash hands, overall min A for standing balance, however, progressed to mod A for dynamic balance when attempting to pull up pants. Pt tolerating ~20 seconds in standing each trial before requiring seated rest break. Assist required for controlled descent due to poor descentric control. Pt transfered into recliner at end of session, left in recliner, set-up with lunch tray all needs in reach and personal care giver  present.   Therapy Documentation Precautions:  Precautions Precautions: Fall Precaution Comments: L painful wrist and saccum  Restrictions Weight Bearing Restrictions: No Pain:   No/ denies pain ADL: ADL ADL Comments: see functional navigator  See Function Navigator for Current Functional Status.   Therapy/Group: Individual Therapy  Jeidi Gilles L 05/17/2017, 7:08 AM

## 2017-05-17 NOTE — Progress Notes (Signed)
Social Work   Tuck Dulworth, Eliezer Champagne  Social Worker  Physical Medicine and Rehabilitation  Patient Care Conference  Signed  Date of Service:  05/16/2017  1:37 PM          Signed          [] Hide copied text  [] Hover for details   Inpatient RehabilitationTeam Conference and Plan of Care Update Date: 05/16/2017   Time: 10:45 AM      Patient Name: Theresa Hampton      Medical Record Number: [id number]  Date of Birth: 09-08-1919 Sex: Female         Room/Bed: 4W24C/4W24C-01 Payor Info: Payor: MEDICARE / Plan: MEDICARE PART A AND B / Product Type: *No Product type* /     Admitting Diagnosis: Rt CVA  Admit Date/Time:  05/09/2017  3:14 PM Admission Comments: No comment available    Primary Diagnosis:  <principal problem not specified> Principal Problem: <principal problem not specified>       Patient Active Problem List    Diagnosis Date Noted  . Right pontine stroke ([place]) 05/09/2017  . Tenosynovitis    . Benign essential HTN    . Chronic kidney disease    . Acute blood loss anemia    . Left wrist pain    . Dysphagia, post-stroke    . Cognitive deficit, post-stroke    . Acute left-sided weakness    . Facial droop 05/06/2017  . Stroke (cerebrum) ([place]) 05/06/2017  . Fatigue 07/29/2015  . Acute sinusitis 03/10/2015  . OA (osteoarthritis) of hip 01/06/2015  . Preop exam for internal medicine 12/16/2014  . HTN (hypertension) 06/09/2014  . Grief 06/09/2014  . Hypokalemia 06/05/2014  . Menopausal disorder 04/07/2014  . Acute pyelonephritis 04/09/2013  . Generalized anxiety disorder 04/09/2013  . Nausea alone 04/07/2013  . Tachycardia 04/07/2013  . Abdominal bloating 04/07/2013  . Neuropathic pain 11/20/2012  . URI, acute 08/21/2012  . Hip pain, left 11/17/2011  . Wrist pain, right 10/27/2010  . Weakness of both legs 10/27/2010  . Cramp of limb 07/09/2009  . B12 deficiency 09/16/2008  . EYE PAIN 02/07/2008  . Sinusitis, chronic 02/07/2008  . Allergic rhinitis  08/06/2007  . Left carotid bruit 08/06/2007  . Vitamin D deficiency 04/26/2007  . Peptic ulcer 04/26/2007  . Osteoarthritis 04/26/2007  . Headache 04/26/2007  . Hyperlipidemia 01/11/2007  . ANEMIA-IRON DEFICIENCY 01/11/2007  . GASTRIC ULCER, ACUTE, HEMORRHAGE 01/11/2007  . DUODENITIS 01/11/2007      Expected Discharge Date: Expected Discharge Date: 05/29/17   Team Members Present: Physician leading conference: Dr. Alysia Penna Social Worker Present: Ovidio Kin, LCSW Nurse Present: Arelia Sneddon, RN PT Present: Phylliss Bob, PTA;Barrie Folk, PT OT Present: Napoleon Form, OT SLP Present: Stormy Fabian, SLP PPS Coordinator present : Daiva Nakayama, RN, CRRN       Current Status/Progress Goal Weekly Team Focus  Medical     low endurance, BPs variable  reduce fall risk and recurrent CVA risk  BP and pain management   Bowel/Bladder     continent of b/b, LBM--1/4 after supp  maintain b/b with mod assist  monitor b/b q shift and prn   Swallow/Nutrition/ Hydration     Supervision dys 2 and thin, trials dys3 Mod A  Mod I  carryover swallow strategies, trials of dys 3   ADL's     Mod A stand pivot transfers; max A LB dressing; min A UB dressing; supervision grooming  Min A overall  Activity tolerance, R Neuro  re-ed, ADL re-training, functional transfers   Mobility     modA bed mobility, modA squat pivot, mod/maxA stand pivot with RW, gait 17ft with RW maxA  min assist overall  L NMR, standing balance, gait, transfers,    Communication     Min- Supervision A  Supervision A  carryover of stratgeies in conversation /sentence level   Safety/Cognition/ Behavioral Observations   Min A  Supervision  selective attention, problem solving, safety awareness and anticipatory awareness   Pain     scheduled tramadol and prn for back pain  maintain pain <2 with mod assist  monitor q shift and prn   Skin     bruises to left face/left arm, healing, no other skin issues  maintain skin with  mod assist  monitor skin q shift and prn     *See Care Plan and progress notes for long and short-term goals.      Barriers to Discharge   Current Status/Progress Possible Resolutions Date Resolved   Physician     Other (comments)  prior hx of recurrent falls and fracture  pain control imprving  Cont CIR, pain management      Nursing                 PT                    OT                 SLP (P) Decreased caregiver support (P) Pt will need hired caregivers 24 hours per day           SW              Discharge Planning/Teaching Needs:  Home with family and hired caregivers providing 24 hr care. Daughter's have been here daily but not sure if completely understand pt's care. Leitha Bleak does understand pt's care needs.      Team Discussion:  Goals min assist level. Back pain managed with tramadol. Dys 2 thin trials of Dys 3 today with speech. Working on balance, attention, and safety awareness. Fatigues easily therapy team building in rest breaks each day. Poor po intake MD watching and monitoring. Family aware will need 24 hr care upon discharge. Pursuing options.  Revisions to Treatment Plan:  DC 1/22    Continued Need for Acute Rehabilitation Level of Care: The patient requires daily medical management by a physician with specialized training in physical medicine and rehabilitation for the following conditions: Daily direction of a multidisciplinary physical rehabilitation program to ensure safe treatment while eliciting the highest outcome that is of practical value to the patient.: Yes Daily medical management of patient stability for increased activity during participation in an intensive rehabilitation regime.: Yes Daily analysis of laboratory values and/or radiology reports with any subsequent need for medication adjustment of medical intervention for : Neurological problems;Other   Theresa Hampton 05/17/2017, 12:42 PM                 Patient ID: Theresa Hampton, female   DOB: [birth year], 84 y.o.   MRN: [id number]

## 2017-05-17 NOTE — Progress Notes (Signed)
Subjective/Complaints:  Appreciate SW note  ROS- no CP, SOB, no N/V/D  Objective: Vital Signs: Blood pressure 120/60, pulse 87, temperature 98 F (36.7 C), temperature source Oral, resp. rate 18, height _0  (1.651 m), weight 57.2 kg (126 lb 1.7 oz), SpO2 95 %. No results found. Results for orders placed or performed during the hospital encounter of 05/09/17 (from the past 72 hour(s))  Basic metabolic panel     Status: Abnormal   Collection Time: 05/16/17  8:54 AM  Result Value Ref Range   Sodium 137 135 - 145 mmol/L   Potassium 3.8 3.5 - 5.1 mmol/L   Chloride 101 101 - 111 mmol/L   CO2 27 22 - 32 mmol/L   Glucose, Bld 150 (H) 65 - 99 mg/dL   BUN 11 6 - 20 mg/dL   Creatinine, Ser 0.67 0.44 - 1.00 mg/dL   Calcium 9.2 8.9 - 10.3 mg/dL   GFR calc non Af Amer >60 >60 mL/min   GFR calc Af Amer >60 >60 mL/min    Comment: (NOTE) The eGFR has been calculated using the CKD EPI equation. This calculation has not been validated in all clinical situations. eGFR's persistently <60 mL/min signify possible Chronic Kidney Disease.    Anion gap 9 5 - 15  CBC     Status: Abnormal   Collection Time: 05/16/17  8:54 AM  Result Value Ref Range   WBC 9.5 4.0 - 10.5 K/uL   RBC 4.08 3.87 - 5.11 MIL/uL   Hemoglobin 11.4 (L) 12.0 - 15.0 g/dL   HCT 36.4 36.0 - 46.0 %   MCV 89.2 78.0 - 100.0 fL   MCH 27.9 26.0 - 34.0 pg   MCHC 31.3 30.0 - 36.0 g/dL   RDW 12.6 11.5 - 15.5 %   Platelets 324 150 - 400 K/uL     HEENT: left periorbital ecchymosis Cardio: RRR and no murmur Resp: CTA B/L and unlabored GI: BS positive and NT, ND Extremity:  Pulses positive and No Edema Skin:   Bruise left periorbital Neuro: Alert/Oriented, Normal Sensory, Abnormal Motor 2-/5 Left delt, bi, tri, grip, Left Hip/knee ext synergy, 0/5 at ankle and Abnormal FMC Ataxic/ dec [facility] Musc/Skel:  Swelling left dorsum of hand and wrist , no pain wit PROM, no point tenderness along the Left midfoot, Met heads, calcaneus ro  achilles tendon, no swelling or erythema Gen NAD   Assessment/Plan: 1. Functional deficits secondary to Right pontine infarct with left hemiparesis which require 3+ hours per day of interdisciplinary therapy in a comprehensive inpatient rehab setting. Physiatrist is providing close team supervision and 24 hour management of active medical problems listed below. Physiatrist and rehab team continue to assess barriers to discharge/monitor patient progress toward functional and medical goals. FIM: Function - Bathing Position: Wheelchair/chair at sink Body parts bathed by patient: Left arm, Abdomen, Front perineal area, Right upper leg, Left upper leg, Right arm, Chest Body parts bathed by helper: Buttocks, Right lower leg, Left lower leg, Back Assist Level: Touching or steadying assistance(Pt > 75%)  Function- Upper Body Dressing/Undressing What is the patient wearing?: Pull over shirt/dress Pull over shirt/dress - Perfomed by patient: Thread/unthread right sleeve, Put head through opening, Pull shirt over trunk, Thread/unthread left sleeve Pull over shirt/dress - Perfomed by helper: Thread/unthread left sleeve, Put head through opening, Pull shirt over trunk Assist Level: Touching or steadying assistance(Pt > 75%) Function - Lower Body Dressing/Undressing What is the patient wearing?: Pants, Shoes, Socks Position: Wheelchair/chair at sink Pants- Performed by  patient: Thread/unthread right pants leg Pants- Performed by helper: Thread/unthread right pants leg, Thread/unthread left pants leg, Pull pants up/down Non-skid slipper socks- Performed by helper: Don/doff right sock, Don/doff left sock Socks - Performed by helper: Don/doff right sock, Don/doff left sock Shoes - Performed by helper: Don/doff right shoe, Don/doff left shoe Assist for footwear: Maximal assist Assist for lower body dressing: Touching or steadying assistance (Pt > 75%)  Function - Toileting Toileting activity did not  occur: No continent bowel/bladder event Toileting steps completed by patient: Performs perineal hygiene Toileting steps completed by helper: Adjust clothing prior to toileting, Adjust clothing after toileting Toileting Assistive Devices: Grab bar or rail Assist level: (max A)  Function - Air cabin crew transfer assistive device: Elevated toilet seat/BSC over toilet Assist level to toilet: Maximal assist (Pt 25 - 49%/lift and lower) Assist level from toilet: Maximal assist (Pt 25 - 49%/lift and lower)  Function - Chair/bed transfer Chair/bed transfer method: Ambulatory Chair/bed transfer assist level: Moderate assist (Pt 50 - 74%/lift or lower) Chair/bed transfer assistive device: Walker, Armrests, Orthosis Chair/bed transfer details: Verbal cues for technique, Verbal cues for sequencing  Function - Locomotion: Wheelchair Type: Manual Max wheelchair distance: 63f Assist Level: Maximal assistance (Pt 25 - 49%) Wheel 50 feet with 2 turns activity did not occur: Safety/medical concerns Wheel 150 feet activity did not occur: Safety/medical concerns Turns around,maneuvers to table,bed, and toilet,negotiates 3% grade,maneuvers on rugs and over doorsills: No Function - Locomotion: Ambulation Ambulation activity did not occur: N/A Assistive device: Walker-rolling, Orthosis Max distance: 293fAssist level: Touching or steadying assistance (Pt > 75%) Walk 10 feet activity did not occur: N/A Assist level: Touching or steadying assistance (Pt > 75%) Walk 50 feet with 2 turns activity did not occur: N/A Walk 150 feet activity did not occur: N/A Walk 10 feet on uneven surfaces activity did not occur: N/A  Function - Comprehension Comprehension: Auditory Comprehension assistive device: Hearing aids Comprehension assist level: Follows basic conversation/direction with extra time/assistive device  Function - Expression Expression: Verbal Expression assist level: Expresses basic  needs/ideas: With extra time/assistive device  Function - Social Interaction Social Interaction assist level: Interacts appropriately 90% of the time - Needs monitoring or encouragement for participation or interaction.  Function - Problem Solving Problem solving assist level: Solves basic 90% of the time/requires cueing < 10% of the time  Function - Memory Memory assist level: Recognizes or recalls 90% of the time/requires cueing < 10% of the time Patient normally able to recall (first 3 days only): That he or she is in a hospital, Current season, Staff names and faces  Medical Problem List and Plan: 1.  Deficits with mobility, self-care, problem solving secondary to right pontine stroke Cont CIR PT, OT,SLP  2.  DVT Prophylaxis/Anticoagulation: Pharmaceutical: Lovenox 3. Pain Management: Ice to left wrist and lower back. Continue Voltaren gel to help with myalgias.Add sportscreme to back      Encourage use of left wrist splint.  4. Mood: Team to provide ego support. Continue Remeron with prn Klonopin.   LCSW to follow for evaluation and support.  5. Neuropsych: This patient is capable of making decisions on her own behalf. 6. Skin/Wound Care: Routine pressure relief measures. Maintain adequate nutritional and hydration status.  7. Fluids/Electrolytes/Nutrition: Monitor I/O. I 36048mn 1/8 , BMET pending 8. B 12 deficiency: Continue Supplement 9. HTN: Monitor BP bid and monitor for orthostatic changes. Will  continue Norvasc for now. Monitor for  Vitals:   05/17/17 0430  05/17/17 0808  BP: (!) 124/92 120/60  Pulse: 91 87  Resp: 18   Temp: 98 F (36.7 C)   SpO2: 91% 95%  Controlled 1/10                                                                                                                                              10. H/o of Iron deficiency anemia: Mild drop noted--add multivitamin with iron.  Baseline 12.2 , now stable at 11.3 cont to monitor, Negative stool occult  blood 11. Dyslipidemia: On Lipitor 12.  Dysphagia Stroke- D2 thin, monitor intake and for signs of asp, SLP, hopefully can be upgraded soon as this may improve intake 13.  Dysarthria due to CVA, SLP eval 14.  Insomnia takes trazodone, remeron, tramadol and gabapentin at night  LOS (Days) 8 A FACE TO FACE EVALUATION WAS PERFORMED  Charlett Blake 05/17/2017, 8:33 AM

## 2017-05-17 NOTE — Progress Notes (Signed)
Physical Therapy Session Note  Patient Details  Name: Theresa Hampton MRN: [id number] Date of Birth: [birth year]  Today's Date: 05/17/2017 PT Individual Time: 1605-1700 PT Individual Time Calculation (min): 55 min   Short Term Goals: Week 1:  PT Short Term Goal 1 (Week 1): Pt will perform bed mobility with mod assist consistently  PT Short Term Goal 2 (Week 1): Pt will perform bed<>WC transfer with mod assist  PT Short Term Goal 3 (Week 1): Pt will ambulate 72f with max assist of 1 with LRAD  PT Short Term Goal 4 (Week 1): Pt will propell WC 1024fwith min assist   Skilled Therapeutic Interventions/Progress Updates:   Pt received sitting in WC and agreeable to PT. [facility] instructed by PT with min assist progressing to supervision assist [id number] cues from PT for increased step height and improved heel contact on the LLE. PT removed LUE hand splint from RW due to improved LUE neuromotor control. Sit<>stand transfers completed with min assist throughout treatment, min verbal cues for proper UE placement and increased anterior weight shift. Nustep reciprocal movement training x 9 minutes with supervision assist from PT;moderate verbal and tactile cues for improved activation of BLE hip adductors as well as increased attention to the LUE on hand rail. Standing balance to toss ball off trampoline 2 x 8 with min assist overall from PT to maintain balance and improved upright posture. Patient returned to room and [facility] and all needs met.        Therapy Documentation Precautions:  Precautions Precautions: Fall Precaution Comments: L painful wrist and saccum  Restrictions Weight Bearing Restrictions: No    Vital Signs: Therapy Vitals Temp: 97.9 F (36.6 C) Temp Source: Oral Pulse Rate: 95 Resp: 18 BP: 128/65 Patient Position (if appropriate): Lying Oxygen Therapy SpO2: 95  % O2 Device: Not Delivered Pain: Denies.   See Function Navigator for Current Functional Status.   Therapy/Group: Individual Therapy  AustinLorie Phenix2019, 5:22 PM

## 2017-05-17 NOTE — Progress Notes (Signed)
Speech Language Pathology Weekly Progress and Session Note  Patient Details  Name: Theresa Hampton MRN: [id number] Date of Birth: 07-11-1919  Beginning of progress report period: May 10, 2016 End of progress report period: May 17, 2016  Today's Date: 05/17/2017 SLP Individual Time: 0830 0930 SLP Time: 60 minutes    Short Term Goals: Week 1: SLP Short Term Goal 1 (Week 1): Pt will consume least restrictive diet with Min A cues for compensatory swallow strategies.  SLP Short Term Goal 1 - Progress (Week 1): Met SLP Short Term Goal 2 (Week 1): Given Min Acues, pt will utilize speech intelligibility strategies to achieve ~90% intelligibility at the sentence level.  SLP Short Term Goal 2 - Progress (Week 1): Met SLP Short Term Goal 3 (Week 1): Given Min A cues, pt will demonstrate selective attention for ~ 15 minutes in mildly distracting environment.  SLP Short Term Goal 3 - Progress (Week 1): Met SLP Short Term Goal 4 (Week 1): Pt will demonstrate increase awareness but answering [facility] safety questions with Min A cues.  SLP Short Term Goal 4 - Progress (Week 1): Met SLP Short Term Goal 5 (Week 1): Pt will complete basic problem solving tasks with supervision cues. SLP Short Term Goal 5 - Progress (Week 1): Met    New Short Term Goals: Week 2: SLP Short Term Goal 1 (Week 2): Pt will consume least restrictive diet with supervision cues for compensatory swallow strategies.  SLP Short Term Goal 2 (Week 2): Given supervision cues, pt will utilize speech intelligibility strategies to achieve ~90% intelligibility at the simple conversation level.  SLP Short Term Goal 3 (Week 2): Given Min A cues, pt will demonstrate selective attention for ~ 30 minutes in mildly distracting environment.  SLP Short Term Goal 4 (Week 2): Pt will demonstrate increased awareness but answering [facility] safety questions with supervision cues.   Weekly Progress Updates: Pt has made good progress during this reporting  period as evidenced by meeting 5 of 5 STGs. Pt with increased oral/lingual manipulation of dysphagia 3 textures with recent upgrade as well as increased speech intelligibility at the sentence level, selective attention and awareness of deficits. Skilled ST continues to be indicated to increase independence level to supervision to reduce caregiver burden.      Intensity: Minumum of 1-2 x/day, 30 to 90 minutes Frequency: 3 to 5 out of 7 days Duration/Length of Stay: 2 weeks Treatment/Interventions: Patient/family education;Dysphagia/aspiration precaution training;Cognitive remediation/compensation;Functional tasks   Daily Session  Skilled Therapeutic Interventions: Skilled treatment session focused on dysphagia and cognition goals. SLP facilitated session by providing skilled observation of dysphagia 3 breakfast tray. Pt with increased lingual manipulation of advanced textures as evidenced by decreased left buccal residue. Pt was intermittent supervision level to reduce talking with food in mouth (baseline behavior). Recommend diet upgrade to dysphagia 3 with supervision. SLP further facilitated session by providing Min A cues for anticipatory awareness and need for 24 hour supervision at time of discharge. Pt was left upright in bed with caregiver present.      Function:   Eating Eating   Modified Consistency Diet: Yes Eating Assist Level: Set up assist for;Supervision or verbal cues   Eating Set Up Assist For: Opening containers       Cognition Comprehension Comprehension assist level: Follows basic conversation/direction with no assist  Expression   Expression assist level: Expresses basic needs/ideas: With no assist  Social Interaction Social Interaction assist level: Interacts appropriately with others with medication or extra  time (anti-anxiety, antidepressant).  Problem Solving Problem solving assist level: Solves basic 90% of the time/requires cueing < 10% of the time  Memory  Memory assist level: Recognizes or recalls 90% of the time/requires cueing < 10% of the time   General    Pain Pain Assessment Pain Score: 0-No pain  Therapy/Group: Individual Therapy  Theresa Hampton 05/17/2017, 4:11 PM

## 2017-05-17 NOTE — Progress Notes (Signed)
Social Work Patient ID: Theresa Hampton, female   DOB: Aug 27, 1919, 84 y.o.   MRN: [id number]  Met with two daughter's when here, they were upset pt found out about [place] from MD and not them this am. Thought they had planned to tell pt yesterday afternoon after team conference but they did not. The will discuss with pt now and answer her questions. She is aware she will require 24 hr physical care at discharge. Pt will talk what her wishes are. RN from facility will come next week to evaluate pt and find out her care needs. Work with on discharge plans.

## 2017-05-18 ENCOUNTER — Inpatient Hospital Stay (HOSPITAL_COMMUNITY): Payer: Medicare Other | Admitting: Occupational Therapy

## 2017-05-18 ENCOUNTER — Inpatient Hospital Stay (HOSPITAL_COMMUNITY): Payer: Medicare Other | Admitting: Physical Therapy

## 2017-05-18 ENCOUNTER — Ambulatory Visit (HOSPITAL_COMMUNITY): Payer: Medicare Other | Admitting: Speech Pathology

## 2017-05-18 NOTE — Progress Notes (Signed)
PPD read at 1400, result negative. Results called to Reesa Chew, PA

## 2017-05-18 NOTE — Progress Notes (Signed)
Subjective/Complaints:  No issues overnite, remains constipated and required enema  ROS- no CP, SOB, no N/V/D  Objective: Vital Signs: Blood pressure 123/62, pulse 99, temperature 98.3 F (36.8 C), temperature source Oral, resp. rate 12, height '5\' 5"'  (1.651 m), weight 55.7 kg (122 lb 12.7 oz), SpO2 92 %. No results found. Results for orders placed or performed during the hospital encounter of 05/09/17 (from the past 72 hour(s))  Basic metabolic panel     Status: Abnormal   Collection Time: 05/16/17  8:54 AM  Result Value Ref Range   Sodium 137 135 - 145 mmol/L   Potassium 3.8 3.5 - 5.1 mmol/L   Chloride 101 101 - 111 mmol/L   CO2 27 22 - 32 mmol/L   Glucose, Bld 150 (H) 65 - 99 mg/dL   BUN 11 6 - 20 mg/dL   Creatinine, Ser 0.67 0.44 - 1.00 mg/dL   Calcium 9.2 8.9 - 10.3 mg/dL   GFR calc non Af Amer >60 >60 mL/min   GFR calc Af Amer >60 >60 mL/min    Comment: (NOTE) The eGFR has been calculated using the CKD EPI equation. This calculation has not been validated in all clinical situations. eGFR's persistently <60 mL/min signify possible Chronic Kidney Disease.    Anion gap 9 5 - 15  CBC     Status: Abnormal   Collection Time: 05/16/17  8:54 AM  Result Value Ref Range   WBC 9.5 4.0 - 10.5 K/uL   RBC 4.08 3.87 - 5.11 MIL/uL   Hemoglobin 11.4 (L) 12.0 - 15.0 g/dL   HCT 36.4 36.0 - 46.0 %   MCV 89.2 78.0 - 100.0 fL   MCH 27.9 26.0 - 34.0 pg   MCHC 31.3 30.0 - 36.0 g/dL   RDW 12.6 11.5 - 15.5 %   Platelets 324 150 - 400 K/uL     HEENT: left periorbital ecchymosis Cardio: RRR and no murmur Resp: CTA B/L and unlabored GI: BS positive and NT, ND Extremity:  Pulses positive and No Edema Skin:   Bruise left periorbital Neuro: Alert/Oriented, Normal Sensory, Abnormal Motor 2-/5 Left delt, bi, tri, grip, Left Hip/knee ext synergy, 0/5 at ankle and Abnormal FMC Ataxic/ dec [facility] Musc/Skel:  Swelling left dorsum of hand and wrist , no pain wit PROM, no point tenderness along the  Left midfoot, Met heads, calcaneus ro achilles tendon, no swelling or erythema Gen NAD   Assessment/Plan: 1. Functional deficits secondary to Right pontine infarct with left hemiparesis which require 3+ hours per day of interdisciplinary therapy in a comprehensive inpatient rehab setting. Physiatrist is providing close team supervision and 24 hour management of active medical problems listed below. Physiatrist and rehab team continue to assess barriers to discharge/monitor patient progress toward functional and medical goals. FIM: Function - Bathing Position: Wheelchair/chair at sink Body parts bathed by patient: Left arm, Abdomen, Front perineal area, Right upper leg, Left upper leg, Right arm, Chest Body parts bathed by helper: Buttocks, Right lower leg, Left lower leg, Back Assist Level: Touching or steadying assistance(Pt > 75%)  Function- Upper Body Dressing/Undressing What is the patient wearing?: Pull over shirt/dress Pull over shirt/dress - Perfomed by patient: Thread/unthread right sleeve, Put head through opening, Pull shirt over trunk, Thread/unthread left sleeve Pull over shirt/dress - Perfomed by helper: Thread/unthread left sleeve, Put head through opening, Pull shirt over trunk Assist Level: Supervision or verbal cues Function - Lower Body Dressing/Undressing What is the patient wearing?: Pants, Shoes, Socks Position: Wheelchair/chair at sink  Pants- Performed by patient: Thread/unthread right pants leg Pants- Performed by helper: Thread/unthread right pants leg, Thread/unthread left pants leg, Pull pants up/down Non-skid slipper socks- Performed by helper: Don/doff right sock, Don/doff left sock Socks - Performed by helper: Don/doff right sock, Don/doff left sock Shoes - Performed by helper: Don/doff right shoe, Don/doff left shoe Assist for footwear: Maximal assist Assist for lower body dressing: Touching or steadying assistance (Pt > 75%)  Function -  Toileting Toileting activity did not occur: No continent bowel/bladder event Toileting steps completed by patient: Performs perineal hygiene Toileting steps completed by helper: Adjust clothing prior to toileting, Adjust clothing after toileting Toileting Assistive Devices: Grab bar or rail Assist level: (max A)  Function - Air cabin crew transfer assistive device: Grab bar Assist level to toilet: Moderate assist (Pt 50 - 74%/lift or lower) Assist level from toilet: Moderate assist (Pt 50 - 74%/lift or lower)  Function - Chair/bed transfer Chair/bed transfer method: Stand pivot Chair/bed transfer assist level: Touching or steadying assistance (Pt > 75%) Chair/bed transfer assistive device: Walker Chair/bed transfer details: Verbal cues for technique, Verbal cues for sequencing  Function - Locomotion: Wheelchair Type: Manual Max wheelchair distance: 6f Assist Level: Maximal assistance (Pt 25 - 49%) Wheel 50 feet with 2 turns activity did not occur: Safety/medical concerns Wheel 150 feet activity did not occur: Safety/medical concerns Turns around,maneuvers to table,bed, and toilet,negotiates 3% grade,maneuvers on rugs and over doorsills: No Function - Locomotion: Ambulation Ambulation activity did not occur: N/A Assistive device: Walker-rolling Max distance: 1279fAssist level: Touching or steadying assistance (Pt > 75%) Walk 10 feet activity did not occur: N/A Assist level: Supervision or verbal cues Walk 50 feet with 2 turns activity did not occur: N/A Assist level: Touching or steadying assistance (Pt > 75%) Walk 150 feet activity did not occur: N/A Walk 10 feet on uneven surfaces activity did not occur: N/A  Function - Comprehension Comprehension: Auditory Comprehension assistive device: Hearing aids Comprehension assist level: Follows basic conversation/direction with no assist  Function - Expression Expression: Verbal Expression assist level: Expresses  basic needs/ideas: With no assist  Function - Social Interaction Social Interaction assist level: Interacts appropriately with others with medication or extra time (anti-anxiety, antidepressant).  Function - Problem Solving Problem solving assist level: Solves basic 90% of the time/requires cueing < 10% of the time  Function - Memory Memory assist level: Recognizes or recalls 90% of the time/requires cueing < 10% of the time Patient normally able to recall (first 3 days only): That he or she is in a hospital, Current season, Staff names and faces  Medical Problem List and Plan: 1.  Deficits with mobility, self-care, problem solving secondary to right pontine stroke Cont CIR PT, OT,SLP  Plan for 1/22 d/c progressing toward goals 2.  DVT Prophylaxis/Anticoagulation: Pharmaceutical: Lovenox 3. Pain Management: Ice to left wrist and lower back. Continue Voltaren gel to help with myalgias.Add sportscreme to back      Encourage use of left wrist splint.  4. Mood: Team to provide ego support. Continue Remeron with prn Klonopin.   LCSW to follow for evaluation and support.  5. Neuropsych: This patient is capable of making decisions on her own behalf. 6. Skin/Wound Care: Routine pressure relief measures. Maintain adequate nutritional and hydration status.  7. Fluids/Electrolytes/Nutrition: Monitor I/O. I 12531mn 1/10 , BMET nl 1/9, per family also drank 12oz coffee and 12 ox water yesterday which was not recorded 8. B 12 deficiency: Continue Supplement 9. HTN: Monitor BP  bid and monitor for orthostatic changes. Will  continue Norvasc for now. Monitor for  Vitals:   05/17/17 1450 05/18/17 0620  BP: 128/65 123/62  Pulse: 95 99  Resp: 18 12  Temp: 97.9 F (36.6 C) 98.3 F (36.8 C)  SpO2: 95% 92%  Controlled 1/11                                                                                                                                              10. H/o of Iron deficiency anemia: Mild drop  noted--add multivitamin with iron.  Baseline 12.2 , now stable at 11.3 cont to monitor, Negative stool occult blood 11. Dyslipidemia: On Lipitor 12.  Dysphagia Stroke- D2 thin, monitor intake and for signs of asp, SLP, hopefully can be upgraded soon as this may improve intake 13.  Dysarthria due to CVA, SLP eval 14.  Insomnia takes trazodone, remeron, tramadol and gabapentin at night  LOS (Days) 9 A FACE TO FACE EVALUATION WAS PERFORMED  Charlett Blake 05/18/2017, 8:26 AM

## 2017-05-18 NOTE — Progress Notes (Signed)
Speech Language Pathology Daily Session Note  Patient Details  Name: Theresa Hampton MRN: [id number] Date of Birth: 09-22-1919  Today's Date: 05/18/2017 SLP Individual Time: 0730-0830 SLP Individual Time Calculation (min): 60 min  Short Term Goals: Week 2: SLP Short Term Goal 1 (Week 2): Pt will consume least restrictive diet with supervision cues for compensatory swallow strategies.  SLP Short Term Goal 2 (Week 2): Given supervision cues, pt will utilize speech intelligibility strategies to achieve ~90% intelligibility at the simple conversation level.  SLP Short Term Goal 3 (Week 2): Given Min A cues, pt will demonstrate selective attention for ~ 30 minutes in mildly distracting environment.  SLP Short Term Goal 4 (Week 2): Pt will demonstrate increased awareness but answering [facility] safety questions with supervision cues.  SLP Short Term Goal 5 (Week 2): Pt will complete semi-complex problems with supervision cues.   Skilled Therapeutic Interventions: Skilled treatment session focused on dysphagia and cognition goals. SLP facilitated session by providing skilled observation of pt consuming dysphagia 3 breakfast tray with thin liquids. Pt with more advanced textures than previous meals - this date had muffin, cottage cheese and pear slices. Pt with mild oral widespread residue when consuming cottage cheese but cleared with cued liquid wash. Pt required Min A cues to completing clear pears before taking another bite. Pt appeared tired and her comments indicated that she was internally distracted by thoughts of going to [employer]. Pt with appropriate questions and was able to demonstrate selective attention with supervision cues. Pt was left upright in wheelchair with caregiver present. Continue per current plan of care.      Function:  Eating Eating   Modified Consistency Diet: Yes Eating Assist Level: Set up assist for;Supervision or verbal cues   Eating Set Up Assist For: Opening  containers       Cognition Comprehension Comprehension assist level: Follows basic conversation/direction with no assist  Expression   Expression assist level: Expresses basic needs/ideas: With no assist  Social Interaction Social Interaction assist level: Interacts appropriately with others with medication or extra time (anti-anxiety, antidepressant).  Problem Solving Problem solving assist level: Solves basic 90% of the time/requires cueing < 10% of the time;Solves basic problems with no assist  Memory Memory assist level: More than reasonable amount of time    Pain    Therapy/Group: Individual Therapy  Jemell Town 05/18/2017, 8:56 AM

## 2017-05-18 NOTE — Progress Notes (Signed)
Physical Therapy Session Note  Patient Details  Name: Theresa Hampton MRN: [id number] Date of Birth: 1919/12/08  Today's Date: 05/18/2017 PT Individual Time: 1400-1500   60 min   Short Term Goals: Week 1:  PT Short Term Goal 1 (Week 1): Pt will perform bed mobility with mod assist consistently  PT Short Term Goal 2 (Week 1): Pt will perform bed<>WC transfer with mod assist  PT Short Term Goal 3 (Week 1): Pt will ambulate 76f with max assist of 1 with LRAD  PT Short Term Goal 4 (Week 1): Pt will propell WC 1068fwith min assist   Skilled Therapeutic Interventions/Progress Updates:    Pt received supine in bed and agreeable to PT. Pt's family present at start of PT session. With pt's permission, family was educated on current functional status as well as long term goals and possible DME needs at d/c. Supine>sit transfer with supervisoin assist and min cues for scooting to EOB.   Gait training instructed by PT with RW, 14020fith intermittent min-supervision assist with min cues for increased step height on the LLE. Dynamic gait training to weave through 4 cones x 2 with RW and min assist. Moderate cues for AD management and gait pattern. Pt noted to have poor safety awarenes at end of dynamic gait training due to fatigue.   Standing balance on level surface progressing to standing on airex while completing peg board puzzle. Supervision assist on level surface and min-mod assist while standing on airex. PT provided min-mod cues for use of ankle strategy to prevent L/posterior LOB as well as cues for improved posture.   Pt returned to room and performed stand pivot transfer to bed with supervision assist and RW. Sit>supine completed with supervision assist, min cues for LLE management, and pt left supine in bed with call bell in reach and all needs met.       Therapy Documentation Precautions:  Precautions Precautions: Fall Precaution Comments: L painful wrist and saccum   Restrictions Weight Bearing Restrictions: No Vital Signs: Therapy Vitals Temp: 97.9 F (36.6 C) Temp Source: Oral Pulse Rate: 93 Resp: 18 BP: (!) 120/50 Patient Position (if appropriate): Sitting Oxygen Therapy SpO2: 96 % O2 Device: Not Delivered Pain: 0/10   See Function Navigator for Current Functional Status.   Therapy/Group: Individual Therapy  AusLorie Phenix11/2019, 5:01 PM

## 2017-05-18 NOTE — Progress Notes (Signed)
Occupational Therapy Session Note  Patient Details  Name: Theresa Hampton MRN: [id number] Date of Birth: 05-15-1919  Today's Date: 05/18/2017 OT Individual Time: 0945-1100 OT Individual Time Calculation (min): 75 min    Short Term Goals: Week 2:  OT Short Term Goal 1 (Week 2): Pt will complete 2/3 toileting task with mod steadying assist in order to decrease caregiver burden OT Short Term Goal 2 (Week 2): Pt will stand at sink to complete 2 grooming tasks in order to increase functional activity tolerance OT Short Term Goal 3 (Week 2): Pt will complete LB dressing with mod A OT Short Term Goal 4 (Week 2): Pt will use L UE at non-dominant level during grooming tasks with no more than 2 VCs for initiation  Skilled Therapeutic Interventions/Progress Updates:    Pt seen for OT session focusing on ADL re-training, functional moility and standing balance in prep for functional tasks. Pt in supine upon arrival with personal care attendant present. Pt ready for tx session. She declined bathing/dressing session this morning. She transferred to EOB using hospital bed functions with supervision.  She brushed hair seated EOB, VCs for initiation of L UE to complete task, pt alternating UE use due to fatigue.  She ambulated within room with CGA, VCs for hand placement on RW during sit <> stand.  In ADL apartment, practiced ambulation on carpeted surface in simulation of home environment and completed sit<>stand transfer from low soft surface couch with min A and VCs for technique to power forward.  In therapy gym, completed dynamic standing task removing clothes pins from pants in standing position in simulation of home environment. CGA provided and pt demonstrated ability to manipulate clothespins using B UEs.  Compeleted x4 trials. Pt returned to room via w/c. Ambulated into bathroom and complete 3/3 toileting tasks with supervision. Returned to sink to wash hands, pt with increased fatigue and knees  began buckling requiring VCs for upright posture, assist required to safely return to w/c. Pt left sitting up in recliner at end of session, all needs in reach.    Therapy Documentation Precautions:  Precautions Precautions: Fall Precaution Comments: L painful wrist and saccum  Restrictions Weight Bearing Restrictions: No Pain:   No/ denies pain ADL: ADL ADL Comments: see functional navigator  See Function Navigator for Current Functional Status.   Therapy/Group: Individual Therapy  Theodore Rahrig L 05/18/2017, 7:14 AM

## 2017-05-19 ENCOUNTER — Inpatient Hospital Stay (HOSPITAL_COMMUNITY): Payer: Medicare Other | Admitting: Physical Therapy

## 2017-05-19 DIAGNOSIS — D509 Iron deficiency anemia, unspecified: Secondary | ICD-10-CM

## 2017-05-19 DIAGNOSIS — I10 Essential (primary) hypertension: Secondary | ICD-10-CM

## 2017-05-19 NOTE — Progress Notes (Signed)
Subjective/Complaints: Patient seen lying in bed this morning. She states she is comfortable overnight. She states she feels she is improving.  ROS: Denies CP, SOB, no N/V/D  Objective: Vital Signs: Blood pressure (!) 139/52, pulse 73, temperature (!) 97.5 F (36.4 C), temperature source Oral, resp. rate 18, height 5\' 5"  (1.651 m), weight 53.6 kg (118 lb 2.7 oz), SpO2 96 %. No results found. No results found for this or any previous visit (from the past 72 hour(s)).   HEENT: left periorbital ecchymosis, improving Cardio: RRR and no JVD Resp: CTA B/L and Unlabored GI: BS positive and ND Skin:   Bruise left periorbital Neuro: Alert/Oriented Motor: 3/5 Left delt, bi, tri, grip  Musc/Skel:  Edema, tenderness improving to left wrist  Gen NAD. Vital signs reviewed.  Assessment/Plan: 1. Functional deficits secondary to Right pontine infarct with left hemiparesis which require 3+ hours per day of interdisciplinary therapy in a comprehensive inpatient rehab setting. Physiatrist is providing close team supervision and 24 hour management of active medical problems listed below. Physiatrist and rehab team continue to assess barriers to discharge/monitor patient progress toward functional and medical goals. FIM: Function - Bathing Position: Wheelchair/chair at sink Body parts bathed by patient: Left arm, Abdomen, Front perineal area, Right upper leg, Left upper leg, Right arm, Chest Body parts bathed by helper: Buttocks, Right lower leg, Left lower leg, Back Assist Level: Touching or steadying assistance(Pt > 75%)  Function- Upper Body Dressing/Undressing What is the patient wearing?: Pull over shirt/dress Pull over shirt/dress - Perfomed by patient: Thread/unthread right sleeve, Put head through opening, Pull shirt over trunk, Thread/unthread left sleeve Pull over shirt/dress - Perfomed by helper: Thread/unthread left sleeve, Put head through opening, Pull shirt over trunk Assist Level:  Supervision or verbal cues Function - Lower Body Dressing/Undressing What is the patient wearing?: Pants, Shoes, Socks Position: Wheelchair/chair at sink Pants- Performed by patient: Thread/unthread right pants leg Pants- Performed by helper: Thread/unthread right pants leg, Thread/unthread left pants leg, Pull pants up/down Non-skid slipper socks- Performed by helper: Don/doff right sock, Don/doff left sock Socks - Performed by helper: Don/doff right sock, Don/doff left sock Shoes - Performed by helper: Don/doff right shoe, Don/doff left shoe Assist for footwear: Maximal assist Assist for lower body dressing: Touching or steadying assistance (Pt > 75%)  Function - Toileting Toileting activity did not occur: No continent bowel/bladder event Toileting steps completed by patient: Performs perineal hygiene Toileting steps completed by helper: Adjust clothing prior to toileting, Adjust clothing after toileting Toileting Assistive Devices: Grab bar or rail Assist level: (max A)  Function - Air cabin crew transfer assistive device: Grab bar Assist level to toilet: Moderate assist (Pt 50 - 74%/lift or lower) Assist level from toilet: Moderate assist (Pt 50 - 74%/lift or lower)  Function - Chair/bed transfer Chair/bed transfer method: Stand pivot Chair/bed transfer assist level: Touching or steadying assistance (Pt > 75%) Chair/bed transfer assistive device: Walker Chair/bed transfer details: Verbal cues for technique, Verbal cues for sequencing  Function - Locomotion: Wheelchair Type: Manual Max wheelchair distance: 5ft  Assist Level: Maximal assistance (Pt 25 - 49%) Wheel 50 feet with 2 turns activity did not occur: Safety/medical concerns Assist Level: Supervision or verbal cues Wheel 150 feet activity did not occur: Safety/medical concerns Turns around,maneuvers to table,bed, and toilet,negotiates 3% grade,maneuvers on rugs and over doorsills: No Function - Locomotion:  Ambulation Ambulation activity did not occur: N/A Assistive device: Walker-rolling Max distance: 140 Assist level: Touching or steadying assistance (Pt > 75%) Walk  10 feet activity did not occur: N/A Assist level: Touching or steadying assistance (Pt > 75%) Walk 50 feet with 2 turns activity did not occur: N/A Assist level: Touching or steadying assistance (Pt > 75%) Walk 150 feet activity did not occur: N/A Assist level: Touching or steadying assistance (Pt > 75%) Walk 10 feet on uneven surfaces activity did not occur: N/A  Function - Comprehension Comprehension: Auditory Comprehension assistive device: Hearing aids Comprehension assist level: Follows basic conversation/direction with no assist  Function - Expression Expression: Verbal Expression assist level: Expresses basic needs/ideas: With no assist  Function - Social Interaction Social Interaction assist level: Interacts appropriately with others with medication or extra time (anti-anxiety, antidepressant).  Function - Problem Solving Problem solving assist level: Solves basic 90% of the time/requires cueing < 10% of the time, Solves basic problems with no assist  Function - Memory Memory assist level: More than reasonable amount of time Patient normally able to recall (first 3 days only): That he or she is in a hospital, Current season, Staff names and faces  Medical Problem List and Plan: 1.  Deficits with mobility, self-care, problem solving secondary to right pontine stroke   Cont CIR  2.  DVT Prophylaxis/Anticoagulation: Pharmaceutical: Lovenox 3. Pain Management: Ice to left wrist and lower back. Continue Voltaren gel to help with myalgias.Add sportscreme to back      Encourage use of left wrist splint.  4. Mood: Team to provide ego support. Continue Remeron with prn Klonopin.   LCSW to follow for evaluation and support.  5. Neuropsych: This patient is capable of making decisions on her own behalf. 6. Skin/Wound  Care: Routine pressure relief measures. Maintain adequate nutritional and hydration status.  7. Fluids/Electrolytes/Nutrition: Monitor I/O.  8. B 12 deficiency: Continue Supplement 9. HTN: Monitor BP bid and monitor for orthostatic changes. Will  continue Norvasc for now. Monitor for  Vitals:   05/18/17 1343 05/19/17 0500  BP: (!) 120/50 (!) 139/52  Pulse: 93 73  Resp: 18 18  Temp: 97.9 F (36.6 C) (!) 97.5 F (36.4 C)  SpO2: 96% 96%    Overall controlled 1/12 10. H/o of Iron deficiency anemia:    Baseline 12.2 , now stable at 11.4 on 1/9    Cont to monitor   Negative stool occult blood 11. Dyslipidemia: On Lipitor 12.  Dysphagia Stroke- monitor intake and for signs of asp, SLP,   Advanced to D3 thin with full supervision 13.  Dysarthria due to CVA, SLP eval 14.  Insomnia takes trazodone, remeron, tramadol and gabapentin at night  LOS (Days) 10 A FACE TO FACE EVALUATION WAS PERFORMED  Monika Chestang Lorie Phenix 05/19/2017, 11:31 AM

## 2017-05-19 NOTE — Plan of Care (Signed)
  Progressing RH BOWEL ELIMINATION RH STG MANAGE BOWEL WITH ASSISTANCE Description STG Manage Bowel with mod Assistance.  05/19/2017 2353 - Progressing by Gwendolyn Grant, RN RH STG MANAGE BOWEL W/MEDICATION W/ASSISTANCE Description STG Manage Bowel with Medication with Assistance. 05/19/2017 2353 - Progressing by Gwendolyn Grant, RN RH BLADDER ELIMINATION RH STG MANAGE BLADDER WITH ASSISTANCE Description STG Manage Bladder With mod Assistance  05/19/2017 2353 - Progressing by Gwendolyn Grant, RN RH SKIN INTEGRITY RH STG MAINTAIN SKIN INTEGRITY WITH ASSISTANCE Description STG Maintain Skin Integrity With mod Assistance.  05/19/2017 2353 - Progressing by Gwendolyn Grant, RN RH SAFETY RH STG ADHERE TO SAFETY PRECAUTIONS W/ASSISTANCE/DEVICE Description STG Adhere to Safety Precautions With  Mod Assistance/Device.  05/19/2017 2353 - Progressing by Gwendolyn Grant, RN RH STG DECREASED RISK OF FALL WITH ASSISTANCE Description STG Decreased Risk of Fall With Assistance. 05/19/2017 2353 - Progressing by Gwendolyn Grant, RN RH COGNITION-NURSING RH STG ANTICIPATES NEEDS/CALLS FOR ASSIST W/ASSIST/CUES Description STG Anticipates Needs/Calls for Assist With Assistance/Cues. 05/19/2017 2353 - Progressing by Gwendolyn Grant, RN RH PAIN MANAGEMENT RH STG PAIN MANAGED AT OR BELOW PT'S PAIN GOAL Description Pain less than 3  05/19/2017 2353 - Progressing by Gwendolyn Grant, RN RH KNOWLEDGE DEFICIT RH STG INCREASE KNOWLEDGE OF DYSPHAGIA/FLUID INTAKE Description Mod assistance increase knowledge fo dysphagia/fluid intake  05/19/2017 2353 - Progressing by Gwendolyn Grant, RN

## 2017-05-19 NOTE — Progress Notes (Signed)
Physical Therapy Weekly Progress Note  Patient Details  Name: Theresa Hampton MRN: [id number] Date of Birth: 07/08/1919  Beginning of progress report period: May 11, 2017 End of progress report period: May 19, 2017  Today's Date: 05/19/2017 PT Individual Time: 1004-1100 PT Individual Time Calculation (min): 56 min   Patient has met 4 of 4 short term goals.  Pt has made better than expected gains with PT over the past week. Pt currently functioning at min-mod assist for transfers depending on seat height, min-supervisoin assist bed mobility with moderate cues, min-supervision assist for ambulation with RW up to 165f. Which is significantly improved from max assist level over all at eval.   `  Patient continues to demonstrate the following deficits muscle weakness, decreased cardiorespiratoy endurance, abnormal tone and unbalanced muscle activation, decreased attention to left and decreased sitting balance, decreased standing balance, decreased postural control, hemiplegia and decreased balance strategies and therefore will continue to benefit from skilled PT intervention to increase functional independence with mobility.  Patient progressing toward long term goals..  Plan of care revisions: PT goals upgraded due to significant improvement in functional status. see plan of care documentation. .  PT Short Term Goals Week 1:  PT Short Term Goal 1 (Week 1): Pt will perform bed mobility with mod assist consistently  PT Short Term Goal 1 - Progress (Week 1): Met PT Short Term Goal 2 (Week 1): Pt will perform bed<>WC transfer with mod assist  PT Short Term Goal 2 - Progress (Week 1): Met PT Short Term Goal 3 (Week 1): Pt will ambulate 225fwith max assist of 1 with LRAD  PT Short Term Goal 3 - Progress (Week 1): Met PT Short Term Goal 4 (Week 1): Pt will propell WC 10077fith min assist  PT Short Term Goal 4 - Progress (Week 1): Met Week 2:  PT Short Term Goal 1 (Week 2): Pt will perform  bed mobility with supervision assist  PT Short Term Goal 2 (Week 2): Pt will consistently performed WC<>bed transfers with min assist  PT Short Term Goal 3 (Week 2): Pt will ambulate >150f14fnsistently with min assist  PT Short Term Goal 4 (Week 2): Pt will ascend 4 steps with min assist for improved community access.   Skilled Therapeutic Interventions/Progress Updates:   Pt received sitting in WC and agreeable to PT. [facility]   Gait training instructed by PT with RW and min assist x 140ft66f note to have decreased foot clearance in the LLE compared to previous PT session, and states that her Leg feels more tired than on previous days.   PT instructed pt in [place] balance exercise program  LAQ, standing HS curls, standing hip abduction, sit<>stand with BUE support, mini squats, and tandem standing with BUE support. All completed x 10 BLE except sit<>stand x 5 and tandem stance 2 x 10 sec BLE. Min assist from PT throughout with min cues for posture and decreased compensation through trunk.   WC mobility with supervision assist x 50 ft with min cues for improved use of the LUE to maintain straight path throughout hall.   Patient returned to [facility] and [facility] and all needs met.          Therapy Documentation Precautions:  Precautions Precautions: Fall Precaution Comments: L painful wrist and saccum  Restrictions Weight Bearing Restrictions: No Pain: Pain Assessment Pain Assessment: No/denies  pain Pain Score: 0-No pain Pain Intervention(s): (scheduled tylenol)   See Function Navigator for Current Functional Status.  Therapy/Group: Individual Therapy  Lorie Phenix 05/19/2017, 11:06 AM

## 2017-05-19 NOTE — Plan of Care (Signed)
PT goals updated/upgraded due to significant improvement in pt's functional ability and progress towards long term goals. See care plan.

## 2017-05-20 ENCOUNTER — Inpatient Hospital Stay (HOSPITAL_COMMUNITY): Payer: Medicare Other

## 2017-05-20 DIAGNOSIS — I69322 Dysarthria following cerebral infarction: Secondary | ICD-10-CM

## 2017-05-20 DIAGNOSIS — G479 Sleep disorder, unspecified: Secondary | ICD-10-CM

## 2017-05-20 DIAGNOSIS — I951 Orthostatic hypotension: Secondary | ICD-10-CM

## 2017-05-20 NOTE — Progress Notes (Signed)
Subjective/Complaints: Patient seen lying in bed this morning. She states she slept well overnight. She denies complaints.  ROS: Denies CP, SOB, no N/V/D  Objective: Vital Signs: Blood pressure (!) 106/56, pulse 98, temperature 98.9 F (37.2 C), temperature source Oral, resp. rate 18, height 5\' 5"  (1.651 m), weight 54.3 kg (119 lb 11.4 oz), SpO2 94 %. No results found. No results found for this or any previous visit (from the past 72 hour(s)).   HEENT: left periorbital ecchymosis, improving Cardio: RRR and no JVD Resp: CTA B/L and unlabored GI: BS positive and ND Skin:   Bruise left periorbital Neuro: Alert/Oriented Motor: 3/5 Left delt, bi, tri, grip (unchanged) Musc/Skel:  Edema, tenderness improving to left wrist  Gen NAD. Vital signs reviewed.  Assessment/Plan: 1. Functional deficits secondary to Right pontine infarct with left hemiparesis which require 3+ hours per day of interdisciplinary therapy in a comprehensive inpatient rehab setting. Physiatrist is providing close team supervision and 24 hour management of active medical problems listed below. Physiatrist and rehab team continue to assess barriers to discharge/monitor patient progress toward functional and medical goals. FIM: Function - Bathing Position: Wheelchair/chair at sink Body parts bathed by patient: Left arm, Abdomen, Front perineal area, Right upper leg, Left upper leg, Right arm, Chest Body parts bathed by helper: Buttocks, Right lower leg, Left lower leg, Back Assist Level: Touching or steadying assistance(Pt > 75%)  Function- Upper Body Dressing/Undressing What is the patient wearing?: Pull over shirt/dress Pull over shirt/dress - Perfomed by patient: Thread/unthread right sleeve, Put head through opening, Pull shirt over trunk, Thread/unthread left sleeve Pull over shirt/dress - Perfomed by helper: Thread/unthread left sleeve, Put head through opening, Pull shirt over trunk Assist Level: Supervision or  verbal cues Function - Lower Body Dressing/Undressing What is the patient wearing?: Pants, Shoes, Socks Position: Wheelchair/chair at sink Pants- Performed by patient: Thread/unthread right pants leg Pants- Performed by helper: Thread/unthread right pants leg, Thread/unthread left pants leg, Pull pants up/down Non-skid slipper socks- Performed by helper: Don/doff right sock, Don/doff left sock Socks - Performed by helper: Don/doff right sock, Don/doff left sock Shoes - Performed by helper: Don/doff right shoe, Don/doff left shoe Assist for footwear: Maximal assist Assist for lower body dressing: Touching or steadying assistance (Pt > 75%)  Function - Toileting Toileting activity did not occur: No continent bowel/bladder event Toileting steps completed by patient: Adjust clothing prior to toileting, Performs perineal hygiene Toileting steps completed by helper: Adjust clothing after toileting Toileting Assistive Devices: Grab bar or rail Assist level: Touching or steadying assistance (Pt.75%)  Function - Air cabin crew transfer assistive device: Grab bar Assist level to toilet: Moderate assist (Pt 50 - 74%/lift or lower) Assist level from toilet: Moderate assist (Pt 50 - 74%/lift or lower)  Function - Chair/bed transfer Chair/bed transfer method: Stand pivot Chair/bed transfer assist level: Touching or steadying assistance (Pt > 75%) Chair/bed transfer assistive device: Walker Chair/bed transfer details: Verbal cues for technique, Verbal cues for sequencing  Function - Locomotion: Wheelchair Type: Manual Max wheelchair distance: 13ft  Assist Level: Maximal assistance (Pt 25 - 49%) Wheel 50 feet with 2 turns activity did not occur: Safety/medical concerns Assist Level: Supervision or verbal cues Wheel 150 feet activity did not occur: Safety/medical concerns Turns around,maneuvers to table,bed, and toilet,negotiates 3% grade,maneuvers on rugs and over doorsills:  No Function - Locomotion: Ambulation Ambulation activity did not occur: N/A Assistive device: Walker-rolling Max distance: 140 Assist level: Touching or steadying assistance (Pt > 75%) Walk 10 feet  activity did not occur: N/A Assist level: Touching or steadying assistance (Pt > 75%) Walk 50 feet with 2 turns activity did not occur: N/A Assist level: Touching or steadying assistance (Pt > 75%) Walk 150 feet activity did not occur: N/A Assist level: Touching or steadying assistance (Pt > 75%) Walk 10 feet on uneven surfaces activity did not occur: N/A  Function - Comprehension Comprehension: Auditory Comprehension assistive device: Hearing aids Comprehension assist level: Follows basic conversation/direction with extra time/assistive device  Function - Expression Expression: Verbal Expression assist level: Expresses basic needs/ideas: With extra time/assistive device  Function - Social Interaction Social Interaction assist level: Interacts appropriately with others with medication or extra time (anti-anxiety, antidepressant).  Function - Problem Solving Problem solving assist level: Solves basic 90% of the time/requires cueing < 10% of the time  Function - Memory Memory assist level: More than reasonable amount of time Patient normally able to recall (first 3 days only): Current season, Staff names and faces, That he or she is in a hospital  Medical Problem List and Plan: 1.  Deficits with mobility, self-care, problem solving secondary to right pontine stroke   Cont CIR  2.  DVT Prophylaxis/Anticoagulation: Pharmaceutical: Lovenox 3. Pain Management: Ice to left wrist and lower back. Continue Voltaren gel to help with myalgias.Add sportscreme to back      Encourage use of left wrist splint.  4. Mood: Team to provide ego support. Continue Remeron with prn Klonopin.   LCSW to follow for evaluation and support.  5. Neuropsych: This patient is capable of making decisions on her own  behalf. 6. Skin/Wound Care: Routine pressure relief measures. Maintain adequate nutritional and hydration status.  7. Fluids/Electrolytes/Nutrition: Monitor I/O.  8. B 12 deficiency: Continue Supplement 9. HTN with orthostatic hypotension: Monitor BP bid and monitor for orthostatic changes. Will  continue Norvasc for now. Monitor for  Vitals:   05/19/17 1437 05/20/17 0539  BP: (!) 119/47 (!) 106/56  Pulse: 78 98  Resp: 18 18  Temp: 97.6 F (36.4 C) 98.9 F (37.2 C)  SpO2: 98% 94%    Overall controlled 1/13, but has orthostasis, will consider intervention if necessary 10. H/o of Iron deficiency anemia:    Baseline 12.2 , now stable at 11.4 on 1/9    Cont to monitor   Negative stool occult blood 11. Dyslipidemia: On Lipitor 12.  Dysphagia Stroke- monitor intake and for signs of asp, SLP   Advanced to D3 thin with full supervision, tolerating diet 13.  Dysarthria due to CVA, SLP eval 14.  Sleep disturbance takes trazodone, remeron, tramadol and gabapentin at night  LOS (Days) 11 A FACE TO FACE EVALUATION WAS PERFORMED  Ankit Lorie Phenix 05/20/2017, 7:59 AM

## 2017-05-20 NOTE — Plan of Care (Signed)
  Progressing RH BOWEL ELIMINATION RH STG MANAGE BOWEL WITH ASSISTANCE Description STG Manage Bowel with mod Assistance.  05/20/2017 2219 - Progressing by Gwendolyn Grant, RN RH STG MANAGE BOWEL W/MEDICATION W/ASSISTANCE Description STG Manage Bowel with Medication with Assistance. 05/20/2017 2219 - Progressing by Gwendolyn Grant, RN RH BLADDER ELIMINATION RH STG MANAGE BLADDER WITH ASSISTANCE Description STG Manage Bladder With mod Assistance  05/20/2017 2219 - Progressing by Gwendolyn Grant, RN RH SKIN INTEGRITY RH STG MAINTAIN SKIN INTEGRITY WITH ASSISTANCE Description STG Maintain Skin Integrity With mod Assistance.  05/20/2017 2219 - Progressing by Gwendolyn Grant, RN RH SAFETY RH STG ADHERE TO SAFETY PRECAUTIONS W/ASSISTANCE/DEVICE Description STG Adhere to Safety Precautions With  Mod Assistance/Device.  05/20/2017 2219 - Progressing by Gwendolyn Grant, RN RH STG DECREASED RISK OF FALL WITH ASSISTANCE Description STG Decreased Risk of Fall With Assistance. 05/20/2017 2219 - Progressing by Gwendolyn Grant, RN RH COGNITION-NURSING RH STG USES MEMORY AIDS/STRATEGIES W/ASSIST TO PROBLEM SOLVE Description STG Uses Memory Aids/Strategies With mod Assistance to Problem Solve.  05/20/2017 2219 - Progressing by Gwendolyn Grant, RN RH STG ANTICIPATES NEEDS/CALLS FOR ASSIST W/ASSIST/CUES Description STG Anticipates Needs/Calls for Assist With Assistance/Cues. 05/20/2017 2219 - Progressing by Gwendolyn Grant, RN RH PAIN MANAGEMENT RH STG PAIN MANAGED AT OR BELOW PT'S PAIN GOAL Description Pain less than 3  05/20/2017 2219 - Progressing by Gwendolyn Grant, RN RH KNOWLEDGE DEFICIT RH STG INCREASE KNOWLEDGE OF DYSPHAGIA/FLUID INTAKE Description Mod assistance increase knowledge fo dysphagia/fluid intake  05/20/2017 2219 - Progressing by Gwendolyn Grant, RN

## 2017-05-20 NOTE — Progress Notes (Signed)
Physical Therapy Note  Patient Details  Name: Theresa Hampton MRN: [id number] Date of Birth: 1920-03-26 Today's Date: 05/20/2017  0930-1030, 60 min individual tx Pain: none per pt; premeciated for L shoulder pain  Pt finishing up with sink bath with Caryl Pina, Therapist, sports.  With cues, pt used L hand to apply cream to face.   Pt needed help pulling shirt and sweater down in the back on the L due to L shoulder limitation/pain.  neuromuscular re-education via forced use, visual cues on L knee for alternating reciprocal movement x 4 extremities on NuStep at level 3 x 5 minutes.. Focus on L knee alignment with hip, as L hip weakness results in L hip adduction in sitting.  Seated bil hip abduction against resistance band, x 15. 10 x 2 bil shoulder adduction with manual cues.  Pre-gait seated kicking activity, using LLE and focusing on L ankle DF when kicking.  Seated L attention task, using Stroop cards; pt named colors accurately 5/10 cards.  Gait training on level tile with RW, during dual task of naming colors on Stroop cards on floor to her L.  Pt accurate 5/5 cards.  Pt demonstrated inattention for safe hand placement sit>< stand.  Pt left resting in w/c with quick release belt applied and all needs within reach.  See function navigator for current status.  Diane Mochizuki 05/20/2017, 8:23 AM

## 2017-05-21 ENCOUNTER — Inpatient Hospital Stay (HOSPITAL_COMMUNITY): Payer: Medicare Other | Admitting: Occupational Therapy

## 2017-05-21 ENCOUNTER — Inpatient Hospital Stay (HOSPITAL_COMMUNITY): Payer: Medicare Other | Admitting: Physical Therapy

## 2017-05-21 ENCOUNTER — Ambulatory Visit (HOSPITAL_COMMUNITY): Payer: Medicare Other | Admitting: Speech Pathology

## 2017-05-21 NOTE — NC FL2 (Signed)
MEDICAID FL2 LEVEL OF CARE SCREENING TOOL     IDENTIFICATION  Patient Name: Theresa Hampton Birthdate: 1920/05/06 Sex: female Admission Date (Current Location): 05/09/2017  [facility] and [place] Number:  Herbalist and Address:  [facility] [place] [place] [address] [place], [place] [address]      Provider Number: [id number]  Attending Physician Name and Address:  Charlett Blake, MD  Relative Name and Phone Number:  Thana Ates  [phone]    Current Level of Care: Other (Comment)(rehab) Recommended Level of Care: Other (Comment)(independent living) Prior Approval Number:    Date Approved/Denied:   PASRR Number:    Discharge Plan: Other (Comment)(Independent living-with hired assist)    Current Diagnoses: Patient Active Problem List   Diagnosis Date Noted  . Dysarthria, post-stroke   . Sleep disturbance   . Orthostatic hypotension   . Right pontine stroke ([place]) 05/09/2017  . Tenosynovitis   . Benign essential HTN   . Chronic kidney disease   . Acute blood loss anemia   . Left wrist pain   . Dysphagia, post-stroke   . Cognitive deficit, post-stroke   . Acute left-sided weakness   . Facial droop 05/06/2017  . Stroke (cerebrum) ([place]) 05/06/2017  . Fatigue 07/29/2015  . Acute sinusitis 03/10/2015  . OA (osteoarthritis) of hip 01/06/2015  . Preop exam for internal medicine 12/16/2014  . HTN (hypertension) 06/09/2014  . Grief 06/09/2014  . Hypokalemia 06/05/2014  . Menopausal disorder 04/07/2014  . Acute pyelonephritis 04/09/2013  . Generalized anxiety disorder 04/09/2013  . Nausea alone 04/07/2013  . Tachycardia 04/07/2013  . Abdominal bloating 04/07/2013  . Neuropathic pain 11/20/2012  . URI, acute 08/21/2012  . Hip pain, left 11/17/2011  . Wrist pain, right 10/27/2010  . Weakness of both legs 10/27/2010  . Cramp of limb 07/09/2009  . B12 deficiency 09/16/2008  . EYE PAIN 02/07/2008  . Sinusitis,  chronic 02/07/2008  . Allergic rhinitis 08/06/2007  . Left carotid bruit 08/06/2007  . Vitamin D deficiency 04/26/2007  . Peptic ulcer 04/26/2007  . Osteoarthritis 04/26/2007  . Headache 04/26/2007  . Hyperlipidemia 01/11/2007  . ANEMIA-IRON DEFICIENCY 01/11/2007  . GASTRIC ULCER, ACUTE, HEMORRHAGE 01/11/2007  . DUODENITIS 01/11/2007    Orientation RESPIRATION BLADDER Height & Weight     Self, Time, Situation, Place  Normal Continent Weight: 116 lb 2.9 oz (52.7 kg) Height:  5\' 5"  (165.1 cm)  BEHAVIORAL SYMPTOMS/MOOD NEUROLOGICAL BOWEL NUTRITION STATUS      Continent Diet(Dys 3 thin liquids)  AMBULATORY STATUS COMMUNICATION OF NEEDS Skin   Limited Assist Verbally Normal                       Personal Care Assistance Level of Assistance  Bathing, Feeding, Dressing Bathing Assistance: Limited assistance Feeding assistance: Limited assistance Dressing Assistance: Limited assistance     Functional Limitations Info  Sight Sight Info: Impaired        SPECIAL CARE FACTORS FREQUENCY  PT (By licensed PT), OT (By licensed OT), Speech therapy     PT Frequency: 3x week OT Frequency: 3x week     Speech Therapy Frequency: 3x week      Contractures Contractures Info: Not present    Additional Factors Info  Code Status, Allergies Code Status Info: Full Code Allergies Info: Lidocaine, HCL           Current Medications (05/21/2017):  This is the current hospital active medication list Current Facility-Administered Medications  Medication Dose Route Frequency Provider Last Rate Last Dose  . acetaminophen (TYLENOL) tablet 325-650 mg  325-650 mg Oral Q4H PRN Bary Leriche, PA-C   650 mg at 05/19/17 0802  . acetaminophen (TYLENOL) tablet 650 mg  650 mg Oral TID WC Bary Leriche, PA-C   650 mg at 05/21/17 1205  . alum & mag hydroxide-simeth (MAALOX/MYLANTA) 200-200-20 MG/5ML suspension 30 mL  30 mL Oral Q4H PRN Love, Pamela S, PA-C      . amLODipine (NORVASC) tablet 2.5  mg  2.5 mg Oral Daily Love, Ivan Anchors, PA-C   2.5 mg at 05/21/17 0723  . atorvastatin (LIPITOR) tablet 40 mg  40 mg Oral q1800 Bary Leriche, PA-C   40 mg at 05/20/17 1639  . bisacodyl (DULCOLAX) suppository 10 mg  10 mg Rectal Daily PRN Bary Leriche, PA-C   10 mg at 05/18/17 6962  . cholecalciferol (VITAMIN D) tablet 2,000 Units  2,000 Units Oral QHS Bary Leriche, PA-C   2,000 Units at 05/20/17 2043  . clonazePAM (KLONOPIN) disintegrating tablet 0.25 mg  0.25 mg Oral BID PRN Bary Leriche, PA-C   0.25 mg at 05/10/17 2052  . clopidogrel (PLAVIX) tablet 75 mg  75 mg Oral Daily Bary Leriche, PA-C   75 mg at 05/21/17 0724  . diclofenac sodium (VOLTAREN) 1 % transdermal gel 2 g  2 g Topical QID Love, Pamela S, PA-C   2 g at 05/21/17 1206  . diphenhydrAMINE (BENADRYL) 12.5 MG/5ML elixir 12.5-25 mg  12.5-25 mg Oral Q6H PRN Love, Pamela S, PA-C      . docusate sodium (COLACE) capsule 100 mg  100 mg Oral QHS Love, Pamela S, PA-C   100 mg at 05/20/17 2042  . enoxaparin (LOVENOX) injection 40 mg  40 mg Subcutaneous Q24H Reesa Chew S, PA-C   40 mg at 05/20/17 1639  . gabapentin (NEURONTIN) capsule 100 mg  100 mg Oral QHS Bary Leriche, PA-C   100 mg at 05/20/17 2043  . guaiFENesin-dextromethorphan (ROBITUSSIN DM) 100-10 MG/5ML syrup 5-10 mL  5-10 mL Oral Q6H PRN Love, Pamela S, PA-C      . latanoprost (XALATAN) 0.005 % ophthalmic solution 1 drop  1 drop Both Eyes QHS Bary Leriche, PA-C   1 drop at 05/20/17 2044  . mirtazapine (REMERON) tablet 15 mg  15 mg Oral QHS Bary Leriche, PA-C   15 mg at 05/20/17 2043  . multivitamins with iron tablet 1 tablet  1 tablet Oral Daily Bary Leriche, PA-C   1 tablet at 05/21/17 9528  . MUSCLE RUB CREA   Topical BID PRN Kirsteins, Luanna Salk, MD      . polyethylene glycol (MIRALAX / GLYCOLAX) packet 17 g  17 g Oral Daily PRN Bary Leriche, PA-C   17 g at 05/17/17 0644  . prochlorperazine (COMPAZINE) tablet 5-10 mg  5-10 mg Oral Q6H PRN Love, Pamela S, PA-C        Or  . prochlorperazine (COMPAZINE) injection 5-10 mg  5-10 mg Intramuscular Q6H PRN Love, Pamela S, PA-C       Or  . prochlorperazine (COMPAZINE) suppository 12.5 mg  12.5 mg Rectal Q6H PRN Love, Pamela S, PA-C      . sodium phosphate (FLEET) 7-19 GM/118ML enema 1 enema  1 enema Rectal Once PRN Love, Pamela S, PA-C      . traMADol Veatrice Bourbon) tablet 25 mg  25 mg Oral Q6H PRN Bary Leriche, PA-C  25 mg at 05/18/17 0635  . traMADol (ULTRAM) tablet 50 mg  50 mg Oral QHS Bary Leriche, PA-C   50 mg at 05/20/17 2042  . traZODone (DESYREL) tablet 25 mg  25 mg Oral QHS PRN Bary Leriche, PA-C   25 mg at 05/20/17 2042  . vitamin B-12 (CYANOCOBALAMIN) tablet 500 mcg  500 mcg Oral Daily Bary Leriche, PA-C   500 mcg at 05/21/17 1470     Discharge Medications: Please see discharge summary for a list of discharge medications.  Relevant Imaging Results:  Relevant Lab Results:   Additional Information SSN: [id number]  Cieanna Stormes, Gardiner Rhyme, LCSW

## 2017-05-21 NOTE — Progress Notes (Signed)
Subjective/Complaints: Per OT doing much better since last THurs  ROS: Denies CP, SOB, no N/V/D  Objective: Vital Signs: Blood pressure 132/60, pulse 88, temperature 98 F (36.7 C), temperature source Oral, resp. rate 18, height 5\' 5"  (1.651 m), weight 52.7 kg (116 lb 2.9 oz), SpO2 93 %. No results found. No results found for this or any previous visit (from the past 72 hour(s)).   HEENT: left periorbital ecchymosis, improving Cardio: RRR and no JVD Resp: CTA B/L and unlabored GI: BS positive and ND Skin:   Bruise left periorbital Neuro: Alert/Oriented Motor: 3/5 Left delt, bi, tri, grip (unchanged) Musc/Skel:  Edema, tenderness improving to left wrist  Gen NAD. Vital signs reviewed.  Assessment/Plan: 1. Functional deficits secondary to Right pontine infarct with left hemiparesis which require 3+ hours per day of interdisciplinary therapy in a comprehensive inpatient rehab setting. Physiatrist is providing close team supervision and 24 hour management of active medical problems listed below. Physiatrist and rehab team continue to assess barriers to discharge/monitor patient progress toward functional and medical goals. FIM: Function - Bathing Position: Wheelchair/chair at sink Body parts bathed by patient: Left arm, Abdomen, Front perineal area, Right upper leg, Left upper leg, Right arm, Chest Body parts bathed by helper: Buttocks, Right lower leg, Left lower leg, Back Assist Level: Touching or steadying assistance(Pt > 75%)  Function- Upper Body Dressing/Undressing What is the patient wearing?: Pull over shirt/dress Pull over shirt/dress - Perfomed by patient: Thread/unthread right sleeve, Put head through opening, Pull shirt over trunk, Thread/unthread left sleeve Pull over shirt/dress - Perfomed by helper: Thread/unthread left sleeve, Put head through opening, Pull shirt over trunk Assist Level: Supervision or verbal cues Function - Lower Body Dressing/Undressing What is  the patient wearing?: Pants, Shoes, Socks Position: Wheelchair/chair at sink Pants- Performed by patient: Thread/unthread right pants leg Pants- Performed by helper: Thread/unthread right pants leg, Thread/unthread left pants leg, Pull pants up/down Non-skid slipper socks- Performed by helper: Don/doff right sock, Don/doff left sock Socks - Performed by helper: Don/doff right sock, Don/doff left sock Shoes - Performed by helper: Don/doff right shoe, Don/doff left shoe Assist for footwear: Maximal assist Assist for lower body dressing: Touching or steadying assistance (Pt > 75%)  Function - Toileting Toileting activity did not occur: No continent bowel/bladder event Toileting steps completed by patient: Adjust clothing prior to toileting, Performs perineal hygiene, Adjust clothing after toileting Toileting steps completed by helper: Adjust clothing after toileting Toileting Assistive Devices: Grab bar or rail Assist level: Touching or steadying assistance (Pt.75%)  Function - Air cabin crew transfer assistive device: Elevated toilet seat/BSC over toilet, Grab bar Assist level to toilet: Touching or steadying assistance (Pt > 75%) Assist level from toilet: Touching or steadying assistance (Pt > 75%)  Function - Chair/bed transfer Chair/bed transfer method: Stand pivot Chair/bed transfer assist level: Touching or steadying assistance (Pt > 75%) Chair/bed transfer assistive device: Walker Chair/bed transfer details: Verbal cues for technique, Verbal cues for sequencing, Verbal cues for safe use of DME/AE  Function - Locomotion: Wheelchair Type: Manual Max wheelchair distance: 73ft  Assist Level: Maximal assistance (Pt 25 - 49%) Wheel 50 feet with 2 turns activity did not occur: Safety/medical concerns Assist Level: Supervision or verbal cues Wheel 150 feet activity did not occur: Safety/medical concerns Turns around,maneuvers to table,bed, and toilet,negotiates 3%  grade,maneuvers on rugs and over doorsills: No Function - Locomotion: Ambulation Ambulation activity did not occur: N/A Assistive device: Walker-rolling Max distance: 120 Assist level: Touching or steadying assistance (Pt >  75%) Walk 10 feet activity did not occur: N/A Assist level: Touching or steadying assistance (Pt > 75%) Walk 50 feet with 2 turns activity did not occur: N/A Assist level: Touching or steadying assistance (Pt > 75%) Walk 150 feet activity did not occur: N/A Assist level: Touching or steadying assistance (Pt > 75%) Walk 10 feet on uneven surfaces activity did not occur: N/A  Function - Comprehension Comprehension: Auditory Comprehension assistive device: Hearing aids Comprehension assist level: Follows basic conversation/direction with no assist  Function - Expression Expression: Verbal Expression assist level: Expresses basic needs/ideas: With no assist  Function - Social Interaction Social Interaction assist level: Interacts appropriately with others with medication or extra time (anti-anxiety, antidepressant).  Function - Problem Solving Problem solving assist level: Solves basic 90% of the time/requires cueing < 10% of the time  Function - Memory Memory assist level: More than reasonable amount of time Patient normally able to recall (first 3 days only): Current season, Staff names and faces, That he or she is in a hospital  Medical Problem List and Plan: 1.  Deficits with mobility, self-care, problem solving secondary to right pontine stroke   Cont CIR PT, OT goals upgraded to Sup 2.  DVT Prophylaxis/Anticoagulation: Pharmaceutical: Lovenox 3. Pain Management: Ice to left wrist and lower back. Continue Voltaren gel to help with myalgias.Add sportscreme to back      Encourage use of left wrist splint.  4. Mood: Team to provide ego support. Continue Remeron with prn Klonopin.   LCSW to follow for evaluation and support.  5. Neuropsych: This patient is  capable of making decisions on her own behalf. 6. Skin/Wound Care: Routine pressure relief measures. Maintain adequate nutritional and hydration status.  7. Fluids/Electrolytes/Nutrition: Monitor I/O.  8. B 12 deficiency: Continue Supplement 9. HTN with orthostatic hypotension: Monitor BP bid and monitor for orthostatic changes. Will  continue Norvasc for now. Monitor for  Vitals:   05/20/17 1538 05/21/17 0500  BP: 118/62 132/60  Pulse: 83 88  Resp: 18 18  Temp: 98.5 F (36.9 C) 98 F (36.7 C)  SpO2: 95% 93%    Overall controlled 1/14, but orthostatic sit to stand-place TED hose 10. H/o of Iron deficiency anemia:    Baseline 12.2 , now stable at 11.4 on 1/9    Cont to monitor   Negative stool occult blood 11. Dyslipidemia: On Lipitor 12.  Dysphagia Stroke- monitor intake and for signs of asp, SLP   Advanced to D3 thin with full supervision, tolerating diet 13.  Dysarthria due to CVA, SLP eval 14.  Sleep disturbance takes trazodone, remeron, tramadol and gabapentin at night  LOS (Days) 12 A FACE TO FACE EVALUATION WAS PERFORMED  Charlett Blake 05/21/2017, 8:35 AM

## 2017-05-21 NOTE — Progress Notes (Signed)
Physical Therapy Session Note  Patient Details  Name: LUAN URBANI MRN: [id number] Date of Birth: 1920-03-09  Today's Date: 05/21/2017 PT Individual Time: 1300-1405 PT Individual Time Calculation (min): 65 min   Short Term Goals: Week 2:  PT Short Term Goal 1 (Week 2): Pt will perform bed mobility with supervision assist  PT Short Term Goal 2 (Week 2): Pt will consistently performed WC<>bed transfers with min assist  PT Short Term Goal 3 (Week 2): Pt will ambulate >134f consistently with min assist  PT Short Term Goal 4 (Week 2): Pt will ascend 4 steps with min assist for improved community access.   Skilled Therapeutic Interventions/Progress Updates: Pt presented in bed agreeable to therapy with caregiver SJudson Rochalso present. Pt performed supine to sit with use of features and supervision. Performed hand on training with Sarah for transfers, Pt able to performed stand pivot and ambulatoty transfer to recliner with Sarah x 2 and was signed off. Pt propelled w/c to rehab gym with x 1 rest due to fatigue. Attempted gait with rollator to check possibility of using AD for entergy conservation. Pt able to ambulate approx 1015fwith minA as pt noted to have increased difficulty controlling RW and possibility of decreased safety. Pt performed stand pivot to mat and transferred to supine with min cues for LLE management and min guard. Performed neuro re-ed via forced use of LLE with activities as follows: AASLR, hip abd/add, HS pulls with physioball, hip ER with level x 2 resistance band, bridges with ball squeeze, all performed x 15 bilaterally. Pt required minA for truncal support to return to sitting EOM with PTA providing tactile cues for use of BUE. Performed stand pivot w/c mi guard and pt transported back to room. Performed ambulatory transfer to bed and performed sit to supine with features and min guard. Pt left in bed with alarm set with needs met.      Therapy Documentation Precautions:   Precautions Precautions: Fall Precaution Comments: L painful wrist and saccum  Restrictions Weight Bearing Restrictions: No General:   Vital Signs: Therapy Vitals Temp: 98.6 F (37 C) Temp Source: Oral Pulse Rate: 81 Resp: 18 BP: 125/62 Patient Position (if appropriate): Lying Oxygen Therapy SpO2: 94 % O2 Device: Not Delivered   See Function Navigator for Current Functional Status.   Therapy/Group: Individual Therapy  Jairen Goldfarb  Ishaaq Penna, PTA  05/21/2017, 4:13 PM

## 2017-05-21 NOTE — Progress Notes (Signed)
Occupational Therapy Session Note  Patient Details  Name: Theresa Hampton MRN: [id number] Date of Birth: [birth year]  Today's Date: 05/21/2017 OT Individual Time: 7017-7939 OT Individual Time Calculation (min): 75 min    Short Term Goals: Week 2:  OT Short Term Goal 1 (Week 2): Pt will complete 2/3 toileting task with mod steadying assist in order to decrease caregiver burden OT Short Term Goal 2 (Week 2): Pt will stand at sink to complete 2 grooming tasks in order to increase functional activity tolerance OT Short Term Goal 3 (Week 2): Pt will complete LB dressing with mod A OT Short Term Goal 4 (Week 2): Pt will use L UE at non-dominant level during grooming tasks with no more than 2 VCs for initiation  Skilled Therapeutic Interventions/Progress Updates:    Pt seen for OT ADL bathing/dressing session. Pt in supine upon arrival, voicing increased fatigue, however, agreeable to tx session. She transferred to EOB using hospital bed functions with min cues.  She ambulated to w/c, ~10 ft with min A, 1 LOB episode requiring mod A to regain balance. Pt voiced dizziness upon activity, BP 139/58, MD present completing morning Zacari Radick and wanting TED hose to be donned- therapist donned following bathing/dressing. Pt completed bathing/dressing and grooming tasks from w/c level at sink, VCs to incorporate use of L UE into functional tasks.  She ambulated into bathroom with CGA, completed clothing management with close supervision.  She declined LB bathing/dressing this session. Pt desiring to return to bed to rest following session, ambulated with CGA to bed and supervision and increased time to return to supine. Pt left in supine at end of session, all needs in reach and personal caregiver present.  Education provided throughout session regarding DME recommendations, continuum of care, pt's progress and return to PLOF and d/c planning.    Therapy Documentation Precautions:   Precautions Precautions: Fall Precaution Comments: L painful wrist and saccum  Restrictions Weight Bearing Restrictions: No Pain:   No/ denies pain ADL: ADL ADL Comments: see functional navigator  See Function Navigator for Current Functional Status.   Therapy/Group: Individual Therapy  Avi Kerschner L 05/21/2017, 7:09 AM

## 2017-05-21 NOTE — Progress Notes (Signed)
Speech Language Pathology Daily Session Note  Patient Details  Name: Theresa Hampton MRN: [id number] Date of Birth: Aug 08, 1919  Today's Date: 05/21/2017 SLP Individual Time: 1100-1200 SLP Individual Time Calculation (min): 60 min  Short Term Goals: Week 2: SLP Short Term Goal 1 (Week 2): Pt will consume least restrictive diet with supervision cues for compensatory swallow strategies.  SLP Short Term Goal 2 (Week 2): Given supervision cues, pt will utilize speech intelligibility strategies to achieve ~90% intelligibility at the simple conversation level.  SLP Short Term Goal 3 (Week 2): Given Min A cues, pt will demonstrate selective attention for ~ 30 minutes in mildly distracting environment.  SLP Short Term Goal 4 (Week 2): Pt will demonstrate increased awareness but answering [facility] safety questions with supervision cues.  SLP Short Term Goal 5 (Week 2): Pt will complete semi-complex problems with supervision cues.   Skilled Therapeutic Interventions: Skilled treatment session focused on communication goals. SLP facilitated session by asking [facility] questions of pt on how she transfers from bed to recliner. Pt able to answer questions appropriately and demonstrate precautions appropriately. Pt able to achieve ~ 100% speech intelligibility at the simple conversation level during a barrier task and unknown topics. After ~ 45 minutes, pt with increased fatigue from use of speech intelligibility strategies (specifically increased vocal intensity). Pt was left upright in recliner with caregiver present. Continue per current plan of care.      Function:   Cognition Comprehension Comprehension assist level: Follows complex conversation/direction with extra time/assistive device  Expression   Expression assist level: Expresses complex 90% of the time/cues < 10% of the time  Social Interaction Social Interaction assist level: Interacts appropriately with others - No medications needed.  Problem Solving  Problem solving assist level: Solves complex 90% of the time/cues < 10% of the time  Memory Memory assist level: More than reasonable amount of time    Pain    Therapy/Group: Individual Therapy  Jakyron Fabro 05/21/2017, 12:42 PM

## 2017-05-22 ENCOUNTER — Inpatient Hospital Stay (HOSPITAL_COMMUNITY): Payer: Medicare Other | Admitting: Occupational Therapy

## 2017-05-22 ENCOUNTER — Inpatient Hospital Stay (HOSPITAL_COMMUNITY): Payer: Medicare Other

## 2017-05-22 ENCOUNTER — Other Ambulatory Visit: Payer: Self-pay | Admitting: Internal Medicine

## 2017-05-22 ENCOUNTER — Inpatient Hospital Stay (HOSPITAL_COMMUNITY): Payer: Medicare Other | Admitting: Physical Therapy

## 2017-05-22 LAB — CBC
HEMATOCRIT: 34.5 % — AB (ref 36.0–46.0)
HEMOGLOBIN: 10.9 g/dL — AB (ref 12.0–15.0)
MCH: 28.2 pg (ref 26.0–34.0)
MCHC: 31.6 g/dL (ref 30.0–36.0)
MCV: 89.4 fL (ref 78.0–100.0)
Platelets: 364 10*3/uL (ref 150–400)
RBC: 3.86 MIL/uL — AB (ref 3.87–5.11)
RDW: 12.8 % (ref 11.5–15.5)
WBC: 7.4 10*3/uL (ref 4.0–10.5)

## 2017-05-22 LAB — BASIC METABOLIC PANEL
Anion gap: 8 (ref 5–15)
BUN: 10 mg/dL (ref 6–20)
CO2: 26 mmol/L (ref 22–32)
Calcium: 9 mg/dL (ref 8.9–10.3)
Chloride: 104 mmol/L (ref 101–111)
Creatinine, Ser: 0.74 mg/dL (ref 0.44–1.00)
GFR calc non Af Amer: >60 mL/min (ref >60)
Glucose, Bld: 102 mg/dL — ABNORMAL HIGH (ref 65–99)
POTASSIUM: 4.2 mmol/L (ref 3.5–5.1)
SODIUM: 138 mmol/L (ref 135–145)

## 2017-05-22 NOTE — Progress Notes (Signed)
Occupational Therapy Session Note  Patient Details  Name: Theresa Hampton MRN: [id number] Date of Birth: Sep 11, 1919  Today's Date: 05/22/2017 OT Individual Time: 1100-1200  And 1430-1500 OT Individual Time Calculation (min): 60 min and 30 min   Short Term Goals: Week 2:  OT Short Term Goal 1 (Week 2): Pt will complete 2/3 toileting task with mod steadying assist in order to decrease caregiver burden OT Short Term Goal 2 (Week 2): Pt will stand at sink to complete 2 grooming tasks in order to increase functional activity tolerance OT Short Term Goal 3 (Week 2): Pt will complete LB dressing with mod A OT Short Term Goal 4 (Week 2): Pt will use L UE at non-dominant level during grooming tasks with no more than 2 VCs for initiation  Skilled Therapeutic Interventions/Progress Updates:    Session One: Pt seen for OT session focusing on ADL re-training. Pt sitting up in bed upon arrival with hand off from SLP. Pt desiring to get OOB and "freshen up".  She completed functional transfers throughout session with supervision, VCs for proper hand placement during transfers. Pt performed best when using B UEs to push up from w/c to RW as pt fearful RW not stable when starting with one UE on RW.  She completed UB bathing/dressing from w/c level at sink, able to cue self to use L UE throughout session. She completed grooming tasks standing at sink, tolerating ~2 minutes of standing to complete oral care and denture care with supervision. She ambulated in/out of bathroom and completed hand hygiene all with supervision.  Discussed throughout session, pt's plan for d/c to ALF and change of routine as well as cont with independence with ADLs. She transitioned to recliner at end of session, left with all needs in reach.    Session Two: Pt seen for OT session focusing on L UE coordination and strengthening in functional task. Pt in supine upon arrival, voicing complaints of fatigue from previous session but  willing to attempt therapy. She transferred to EOB from flat bed with supervision. Seated unsupported EOB, pt worked on Engineer, civil (consulting) per request. Trialed use of pen with and without built up gripper, no large difference in performance and pt stated she preferred using the gripper. Practiced writing out names of family members and then practiced tracing small objects to address control of movements. Pt then provided with word search book and educated regarding how to complete. Pt stated she had never done word searches, but was enjoying it. She desired to stay sitting EOB at end of session and cont working on word search.  Pt left seated EOB with all needs in reach, bed alarm on, and call bell in reach. NT made aware of pt's position.   Therapy Documentation Precautions:  Precautions Precautions: Fall Precaution Comments: L painful wrist and saccum  Restrictions Weight Bearing Restrictions: No Pain:   No/ denies pain ADL: ADL ADL Comments: see functional navigator  See Function Navigator for Current Functional Status.   Therapy/Group: Individual Therapy  Cuahutemoc Attar L 05/22/2017, 7:04 AM

## 2017-05-22 NOTE — Progress Notes (Signed)
Physical Therapy Session Note  Patient Details  Name: Theresa Hampton MRN: [id number] Date of Birth: [birth year]  Today's Date: 05/22/2017 PT Individual Time: 1300-1400 PT Individual Time Calculation (min): 60 min   Short Term Goals: Week 2:  PT Short Term Goal 1 (Week 2): Pt will perform bed mobility with supervision assist  PT Short Term Goal 2 (Week 2): Pt will consistently performed WC<>bed transfers with min assist  PT Short Term Goal 3 (Week 2): Pt will ambulate >150ft consistently with min assist  PT Short Term Goal 4 (Week 2): Pt will ascend 4 steps with min assist for improved community access.   Skilled Therapeutic Interventions/Progress Updates: Pt presented in chair agreeable to therapy. Pt ambulated to rehab gym with RW and min guard with intermittent cues for increasing L foot clearance. Pt indicating feeling like "head was swimming" and needed to lay down after ambulation. Vitals checked BP 141/61, HR 120 SpO2 95%. Pt felt better after 3-5 min rest. Pt practiced supine to sit bed mobility from flat surface. Pt required mod cues for sequencing and initially minA progressing to min guard with several trials. Pt then transported to ADL apt and performed supine to/from stand on standard bed with supervision. Pt returned to rehab gym and participated in Cybex Kinetron 40cm/sec sitting in w/c x 5 min for LE strengthening. Pt transported back to room total assist and performed ambulatory transfer to bed min guard and performing bed mobility with supervision. Pt left with bed alarm on and call bell within reach.      Therapy Documentation Precautions:  Precautions Precautions: Fall Precaution Comments: L painful wrist and saccum  Restrictions Weight Bearing Restrictions: No General:   Vital Signs: Therapy Vitals Temp: 97.9 F (36.6 C) Temp Source: Oral Pulse Rate: 92 Resp: 18 BP: (!) 109/55 Patient Position (if appropriate): Sitting Oxygen Therapy SpO2: 93 % O2 Device:  Not Delivered   See Function Navigator for Current Functional Status.   Therapy/Group: Individual Therapy  Baley Lorimer  Alaira Level, PTA  05/22/2017, 3:08 PM

## 2017-05-22 NOTE — Progress Notes (Signed)
Subjective/Complaints:  No issues overnite, has a good appetite, no pains or dizziness  ROS: Denies CP, SOB, no N/V/D  Objective: Vital Signs: Blood pressure 140/64, pulse 98, temperature 98.9 F (37.2 C), temperature source Oral, resp. rate 16, height 5\' 5"  (1.651 m), weight 53 kg (116 lb 13.5 oz), SpO2 98 %. No results found. No results found for this or any previous visit (from the past 72 hour(s)).   HEENT: left periorbital ecchymosis, improving Cardio: RRR and no JVD Resp: CTA B/L and unlabored GI: BS positive and ND Skin:   Bruise left periorbital Neuro: Alert/Oriented Motor: 3/5 Left delt, bi, tri, grip (unchanged) Musc/Skel:  Edema, tenderness improving to left wrist  Gen NAD. Vital signs reviewed.  Assessment/Plan: 1. Functional deficits secondary to Right pontine infarct with left hemiparesis which require 3+ hours per day of interdisciplinary therapy in a comprehensive inpatient rehab setting. Physiatrist is providing close team supervision and 24 hour management of active medical problems listed below. Physiatrist and rehab team continue to assess barriers to discharge/monitor patient progress toward functional and medical goals. FIM: Function - Bathing Position: Wheelchair/chair at sink Body parts bathed by patient: Left arm, Abdomen, Front perineal area, Right arm, Chest(Declined LB; supervision UB) Body parts bathed by helper: Buttocks, Right lower leg, Left lower leg, Back Assist Level: Supervision or verbal cues  Function- Upper Body Dressing/Undressing What is the patient wearing?: Pull over shirt/dress Pull over shirt/dress - Perfomed by patient: Thread/unthread right sleeve, Put head through opening, Pull shirt over trunk, Thread/unthread left sleeve Pull over shirt/dress - Perfomed by helper: Thread/unthread left sleeve, Put head through opening, Pull shirt over trunk Assist Level: Supervision or verbal cues Function - Lower Body Dressing/Undressing What  is the patient wearing?: Non-skid slipper socks, Ted Hose Position: Wheelchair/chair at [employer]- Performed by patient: Thread/unthread right pants leg Pants- Performed by helper: Thread/unthread right pants leg, Thread/unthread left pants leg, Pull pants up/down Non-skid slipper socks- Performed by patient: Don/doff right sock Non-skid slipper socks- Performed by helper: Don/doff left sock Socks - Performed by helper: Don/doff right sock, Don/doff left sock Shoes - Performed by helper: Don/doff right shoe, Don/doff left shoe TED Hose - Performed by helper: Don/doff right TED hose, Don/doff left TED hose Assist for footwear: Partial/moderate assist Assist for lower body dressing: Touching or steadying assistance (Pt > 75%)  Function - Toileting Toileting activity did not occur: No continent bowel/bladder event Toileting steps completed by patient: Adjust clothing prior to toileting, Performs perineal hygiene, Adjust clothing after toileting Toileting steps completed by helper: Adjust clothing after toileting Toileting Assistive Devices: Grab bar or rail Assist level: Touching or steadying assistance (Pt.75%)  Function - Air cabin crew transfer assistive device: Elevated toilet seat/BSC over toilet, Grab bar, Walker Assist level to toilet: Touching or steadying assistance (Pt > 75%) Assist level from toilet: Touching or steadying assistance (Pt > 75%)  Function - Chair/bed transfer Chair/bed transfer method: Stand pivot Chair/bed transfer assist level: Touching or steadying assistance (Pt > 75%) Chair/bed transfer assistive device: Walker Chair/bed transfer details: Verbal cues for technique, Verbal cues for sequencing, Verbal cues for safe use of DME/AE  Function - Locomotion: Wheelchair Type: Manual Max wheelchair distance: 39ft  Assist Level: Maximal assistance (Pt 25 - 49%) Wheel 50 feet with 2 turns activity did not occur: Safety/medical concerns Assist Level:  Supervision or verbal cues Wheel 150 feet activity did not occur: Safety/medical concerns Turns around,maneuvers to table,bed, and toilet,negotiates 3% grade,maneuvers on rugs and over doorsills: No Function -  Locomotion: Ambulation Ambulation activity did not occur: N/A Assistive device: Walker-rolling Max distance: 120 Assist level: Touching or steadying assistance (Pt > 75%) Walk 10 feet activity did not occur: N/A Assist level: Touching or steadying assistance (Pt > 75%) Walk 50 feet with 2 turns activity did not occur: N/A Assist level: Touching or steadying assistance (Pt > 75%) Walk 150 feet activity did not occur: N/A Assist level: Touching or steadying assistance (Pt > 75%) Walk 10 feet on uneven surfaces activity did not occur: N/A  Function - Comprehension Comprehension: (P) Auditory Comprehension assistive device: Hearing aids Comprehension assist level: (P) Follows complex conversation/direction with extra time/assistive device  Function - Expression Expression: (P) Verbal Expression assist level: (P) Expresses complex 90% of the time/cues < 10% of the time  Function - Social Interaction Social Interaction assist level: (P) Interacts appropriately with others - No medications needed.  Function - Problem Solving Problem solving assist level: (P) Solves complex 90% of the time/cues < 10% of the time  Function - Memory Memory assist level: (P) More than reasonable amount of time Patient normally able to recall (first 3 days only): Current season, Staff names and faces, That he or she is in a hospital, Location of own room  Medical Problem List and Plan: 1.  Deficits with mobility, self-care, problem solving secondary to right pontine stroke   Cont CIR PT, OT goals upgraded to Sup 2.  DVT Prophylaxis/Anticoagulation: Pharmaceutical: Lovenox 3. Pain Management: Ice to left wrist and lower back. Continue Voltaren gel to help with myalgias.Add sportscreme to back       Encourage use of left wrist splint.  4. Mood: Team to provide ego support. Continue Remeron with prn Klonopin.   LCSW to follow for evaluation and support.  5. Neuropsych: This patient is capable of making decisions on her own behalf. 6. Skin/Wound Care: Routine pressure relief measures. Maintain adequate nutritional and hydration status.  7. Fluids/Electrolytes/Nutrition: Monitor I/O.  8. B 12 deficiency: Continue Supplement 9. HTN with orthostatic hypotension: Monitor BP bid and monitor for orthostatic changes. Will  continue Norvasc for now. Monitor for  Vitals:   05/21/17 1500 05/22/17 0516  BP: 125/62 140/64  Pulse: 81 98  Resp: 18 16  Temp: 98.6 F (37 C) 98.9 F (37.2 C)  SpO2: 94% 98%    Overall controlled 1/15 and orthostastis resolved 10. H/o of Iron deficiency anemia:    Baseline 12.2 , now stable at 11.4 on 1/9    Cont to monitor   Negative stool occult blood 11. Dyslipidemia: On Lipitor 12.  Dysphagia Stroke- monitor intake and for signs of asp, SLP   Advanced to D3 thin with full supervision, tolerating diet 13.  Dysarthria due to CVA, SLP eval 14.  Sleep disturbance takes trazodone, remeron, tramadol and gabapentin at night  LOS (Days) 13 A FACE TO FACE EVALUATION WAS PERFORMED  Charlett Blake 05/22/2017, 8:18 AM

## 2017-05-22 NOTE — Progress Notes (Signed)
Speech Language Pathology Daily Session Note  Patient Details  Name: Theresa Hampton MRN: [id number] Date of Birth: 09-04-1919  Today's Date: 05/22/2017 SLP Individual Time: 1000-1100 SLP Individual Time Calculation (min): 60 min  Short Term Goals: Week 2: SLP Short Term Goal 1 (Week 2): Pt will consume least restrictive diet with supervision cues for compensatory swallow strategies.  SLP Short Term Goal 2 (Week 2): Given supervision cues, pt will utilize speech intelligibility strategies to achieve ~90% intelligibility at the simple conversation level.  SLP Short Term Goal 3 (Week 2): Given Min A cues, pt will demonstrate selective attention for ~ 30 minutes in mildly distracting environment.  SLP Short Term Goal 4 (Week 2): Pt will demonstrate increased awareness but answering [facility] safety questions with supervision cues.  SLP Short Term Goal 5 (Week 2): Pt will complete semi-complex problems with supervision cues.   Skilled Therapeutic Interventions: Skilled ST services focused on cognitive skills. SLP facilitated semi-complex problem solving targeting organization and working memory in 4-5 step sequencing task, pt required Min A verbal cues however faded to supervision A after initial trial. SLP and discussed moving to "[place]" and pt demonstrated ability to respond to open ended safety questions addressing problem solving and anticipatory awareness skills with supervision A question cues. Pt demonstrated selective attention in mildly distracting environment with Min A verbal cues to redirect attention and 90% intelligibility in conversation with supervision A verbal cues. Pt was left in room with OT. Recommend continuing skilled St services.     Function:  Eating Eating                 Cognition Comprehension Comprehension assist level: Follows complex conversation/direction with extra time/assistive device  Expression   Expression assist level: Expresses complex 90% of the  time/cues < 10% of the time  Social Interaction Social Interaction assist level: Interacts appropriately with others - No medications needed.  Problem Solving Problem solving assist level: Solves complex 90% of the time/cues < 10% of the time  Memory      Pain Pain Assessment Pain Assessment: No/denies pain  Therapy/Group: Individual Therapy  Carrine Kroboth 05/22/2017, 12:12 PM

## 2017-05-23 ENCOUNTER — Inpatient Hospital Stay (HOSPITAL_COMMUNITY): Payer: Medicare Other | Admitting: Physical Therapy

## 2017-05-23 ENCOUNTER — Inpatient Hospital Stay (HOSPITAL_COMMUNITY): Payer: Medicare Other | Admitting: Occupational Therapy

## 2017-05-23 ENCOUNTER — Encounter (HOSPITAL_COMMUNITY): Payer: Self-pay | Admitting: Student

## 2017-05-23 ENCOUNTER — Inpatient Hospital Stay (HOSPITAL_COMMUNITY): Payer: Medicare Other

## 2017-05-23 NOTE — Progress Notes (Signed)
Physical Therapy Session Note  Patient Details  Name: Theresa Hampton MRN: [id number] Date of Birth: Jan 16, 1920  Today's Date: 05/23/2017 PT Individual Time: 3295-1884 PT Individual Time Calculation (min): 40 min   Short Term Goals: Week 2:  PT Short Term Goal 1 (Week 2): Pt will perform bed mobility with supervision assist  PT Short Term Goal 2 (Week 2): Pt will consistently performed WC<>bed transfers with min assist  PT Short Term Goal 3 (Week 2): Pt will ambulate >126ft consistently with min assist  PT Short Term Goal 4 (Week 2): Pt will ascend 4 steps with min assist for improved community access.   Skilled Therapeutic Interventions/Progress Updates:  Pt received in bed with friend Judson Roch) present for session. Pt denied c/o pain & agreeable to tx. Pt reports feeling "just exhausted" and required frequent rest breaks throughout session. Pt ambulated room>apartment but took seated break by elevator and in gym. In apartment pt practiced ambulating over carpeted surface to simulate her apartment in d/c setting. Pt navigated through kitchen then returned to room without a rest break with steady assist. Therapist provided instructional cuing for increased dorsiflexion LLE to prevent foot drag, which increases with fatigue. In apartment pt completed sit<>stand transfer from rocking recliner with close supervision. At end of session pt returned to supine in bed & was left with all needs within reach & bed alarm set. Pt missed 20 minutes 2/2 fatigue.  Provided pt with walker bag to allow her to safely carry items.  Therapy Documentation Precautions:  Precautions Precautions: Fall Precaution Comments: L painful wrist and saccum  Restrictions Weight Bearing Restrictions: No General: PT Amount of Missed Time (min): 20 Minutes PT Missed Treatment Reason: Patient fatigue   See Function Navigator for Current Functional Status.   Therapy/Group: Individual Therapy  Waunita Schooner 05/23/2017, 3:02 PM

## 2017-05-23 NOTE — Progress Notes (Signed)
Speech Language Pathology Daily Session Note  Patient Details  Name: Theresa Hampton MRN: [id number] Date of Birth: 08/31/1919  Today's Date: 05/23/2017 SLP Individual Time: 6629-4765 SLP Individual Time Calculation (min): 31 min  Short Term Goals: Week 2: SLP Short Term Goal 1 (Week 2): Pt will consume least restrictive diet with supervision cues for compensatory swallow strategies.  SLP Short Term Goal 2 (Week 2): Given supervision cues, pt will utilize speech intelligibility strategies to achieve ~90% intelligibility at the simple conversation level.  SLP Short Term Goal 3 (Week 2): Given Min A cues, pt will demonstrate selective attention for ~ 30 minutes in mildly distracting environment.  SLP Short Term Goal 4 (Week 2): Pt will demonstrate increased awareness but answering [facility] safety questions with supervision cues.  SLP Short Term Goal 5 (Week 2): Pt will complete semi-complex problems with supervision cues.   Skilled Therapeutic Interventions: This session, skilled ST services focused on cognitive skills. SLP facilitated semi-complex problem solving task involving scheduling given min A verbal cues for planning and organization. When asked basic and semi-complex wh- questions re: daily rehab schedule, pt answered questions with 100% accuracy given extra processing time and supervision A question cues. SLP and pt discussed moving to "[facility] [employer]" with pt continuing to demonstrate ability to respond to open ended safety questions addressing problem solving and anticipatory awareness skills with supervision A question cues.  Pt was left upright in wc with call bell in reach. Recommend continuing skilled ST services to address aforementioned goals.  Function:  Cognition Comprehension Comprehension assist level: Follows complex conversation/direction with extra time/assistive device  Expression   Expression assist level: Expresses complex 90% of the time/cues < 10% of the time  Social  Interaction Social Interaction assist level: Interacts appropriately with others - No medications needed.  Problem Solving Problem solving assist level: Solves complex 90% of the time/cues < 10% of the time  Memory Memory assist level: More than reasonable amount of time    Pain Pain Assessment Pain Assessment: 0-10 Pain Score: 4  Pain Type: Chronic pain Pain Location: Head(headache) Pain Orientation: Anterior(sinus area per pt) Pain Descriptors / Indicators: Dull Pain Frequency: Intermittent Pain Intervention(s): Medication (See eMAR)  Therapy/Group: Individual Therapy  Dannisha Eckmann A Garrie Elenes 05/23/2017, 11:34 AM

## 2017-05-23 NOTE — Progress Notes (Signed)
Social Work   Belvin Gauss, Eliezer Champagne  Social Worker  Physical Medicine and Rehabilitation  Patient Care Conference  Signed  Date of Service:  05/23/2017 12:39 PM          Signed          [] Hide copied text  [] Hover for details   Inpatient RehabilitationTeam Conference and Plan of Care Update Date: 05/23/2017   Time: 10:40 AM      Patient Name: Theresa Hampton      Medical Record Number: [id number]  Date of Birth: [birth year] Sex: Female         Room/Bed: 4W24C/4W24C-01 Payor Info: Payor: MEDICARE / Plan: MEDICARE PART A AND B / Product Type: *No Product type* /     Admitting Diagnosis: Rt CVA  Admit Date/Time:  05/09/2017  3:14 PM Admission Comments: No comment available    Primary Diagnosis:  <principal problem not specified> Principal Problem: <principal problem not specified>       Patient Active Problem List    Diagnosis Date Noted  . Dysarthria, post-stroke    . Sleep disturbance    . Orthostatic hypotension    . Right pontine stroke ([place]) 05/09/2017  . Tenosynovitis    . Benign essential HTN    . Chronic kidney disease    . Acute blood loss anemia    . Left wrist pain    . Dysphagia, post-stroke    . Cognitive deficit, post-stroke    . Acute left-sided weakness    . Facial droop 05/06/2017  . Stroke (cerebrum) ([place]) 05/06/2017  . Fatigue 07/29/2015  . Acute sinusitis 03/10/2015  . OA (osteoarthritis) of hip 01/06/2015  . Preop exam for internal medicine 12/16/2014  . HTN (hypertension) 06/09/2014  . Grief 06/09/2014  . Hypokalemia 06/05/2014  . Menopausal disorder 04/07/2014  . Acute pyelonephritis 04/09/2013  . Generalized anxiety disorder 04/09/2013  . Nausea alone 04/07/2013  . Tachycardia 04/07/2013  . Abdominal bloating 04/07/2013  . Neuropathic pain 11/20/2012  . URI, acute 08/21/2012  . Hip pain, left 11/17/2011  . Wrist pain, right 10/27/2010  . Weakness of both legs 10/27/2010  . Cramp of limb 07/09/2009  . B12 deficiency  09/16/2008  . EYE PAIN 02/07/2008  . Sinusitis, chronic 02/07/2008  . Allergic rhinitis 08/06/2007  . Left carotid bruit 08/06/2007  . Vitamin D deficiency 04/26/2007  . Peptic ulcer 04/26/2007  . Osteoarthritis 04/26/2007  . Headache 04/26/2007  . Hyperlipidemia 01/11/2007  . ANEMIA-IRON DEFICIENCY 01/11/2007  . GASTRIC ULCER, ACUTE, HEMORRHAGE 01/11/2007  . DUODENITIS 01/11/2007      Expected Discharge Date: Expected Discharge Date: 05/26/17   Team Members Present: Physician leading conference: Dr. Alysia Penna Social Worker Present: Ovidio Kin, LCSW Nurse Present: Dorthula Nettles, RN PT Present: Barrie Folk, PT OT Present: Napoleon Form, OT SLP Present: Charolett Bumpers, SLP PPS Coordinator present : Daiva Nakayama, RN, CRRN       Current Status/Progress Goal Weekly Team Focus  Medical     Endurance improved, Left side now 4/5  upgraded goals Sup  d/c planning   Bowel/Bladder     continent of bowel and bladder, LBM 05-22-17   Remain continent of bowel and bladder  Assist with toileting needs prn   Swallow/Nutrition/ Hydration     upgrade dys 3 and thn, supervision A  Mod I  carryover swallow strategies    ADL's     Supervision- occasional min A overll; min A LB dressing  Upgraded to supervision overall  Functional  activity tolerance, L neuro re-ed, ADL/IADl re-training   Mobility     min guard/supervision bed mobiity, minA to min guard sit/stand transfers, gait min guard up to 15ft  min assist overall  L NMR, LLE strengthening, gait   Communication     Supervision A sentence, Min A conversation   Supervision A  carryover strategies in conversation    Safety/Cognition/ Behavioral Observations   Min-Supervision A  supervision   selective attention, problem solving, safety awraeness and anticipatory awareness   Pain     pain managed with scheduled tramadol and prn for back pain  maintain pain <2 with mod assist  Assess pain q shift and prn   Skin     bruises to left  face, left arm   no new skin issues   Assess skin q shift and prn     *See Care Plan and progress notes for long and short-term goals.      Barriers to Discharge   Current Status/Progress Possible Resolutions Date Resolved   Physician     Decreased caregiver support     has exceeded goals  May be d/ced with 24/7 sup      Nursing                 PT                    OT                 SLP            SW              Discharge Planning/Teaching Needs:  Family planning on having pt go to [place] with hired caregiver's from rehab. RN to come in this week and evaluate pt for their facility.       Team Discussion:  Progressing and exceeding her goals therapy team has upgraded her goals to supervision level. L-LE stronger and gait is more functional. Continue to work on attention and problem solving. Will move up discharge date to 1/19 due to reaching goals sooner than expected. Diet upgraded to Dys 3 thin  Revisions to Treatment Plan:  DC 1/19 upgraded goals to supervision level    Continued Need for Acute Rehabilitation Level of Care: The patient requires daily medical management by a physician with specialized training in physical medicine and rehabilitation for the following conditions: Daily direction of a multidisciplinary physical rehabilitation program to ensure safe treatment while eliciting the highest outcome that is of practical value to the patient.: Yes Daily medical management of patient stability for increased activity during participation in an intensive rehabilitation regime.: Yes Daily analysis of laboratory values and/or radiology reports with any subsequent need for medication adjustment of medical intervention for : Neurological problems   Sandra Tellefsen, Gardiner Rhyme 05/23/2017, 12:39 PM                  Miran Kautzman, Gardiner Rhyme, LCSW  Social Worker  Physical Medicine and Rehabilitation  Patient Care Conference  Signed  Date of Service:  05/16/2017  1:37 PM            Signed          [] Hide copied text  [] Hover for details   Inpatient RehabilitationTeam Conference and Plan of Care Update Date: 05/16/2017   Time: 10:45 AM      Patient Name: Theresa Hampton Record Number: [id number]  Date of  Birth: 04/18/1920 Sex: Female         Room/Bed: 4W24C/4W24C-01 Payor Info: Payor: MEDICARE / Plan: MEDICARE PART A AND B / Product Type: *No Product type* /     Admitting Diagnosis: Rt CVA  Admit Date/Time:  05/09/2017  3:14 PM Admission Comments: No comment available    Primary Diagnosis:  <principal problem not specified> Principal Problem: <principal problem not specified>       Patient Active Problem List    Diagnosis Date Noted  . Right pontine stroke ([place]) 05/09/2017  . Tenosynovitis    . Benign essential HTN    . Chronic kidney disease    . Acute blood loss anemia    . Left wrist pain    . Dysphagia, post-stroke    . Cognitive deficit, post-stroke    . Acute left-sided weakness    . Facial droop 05/06/2017  . Stroke (cerebrum) ([place]) 05/06/2017  . Fatigue 07/29/2015  . Acute sinusitis 03/10/2015  . OA (osteoarthritis) of hip 01/06/2015  . Preop exam for internal medicine 12/16/2014  . HTN (hypertension) 06/09/2014  . Grief 06/09/2014  . Hypokalemia 06/05/2014  . Menopausal disorder 04/07/2014  . Acute pyelonephritis 04/09/2013  . Generalized anxiety disorder 04/09/2013  . Nausea alone 04/07/2013  . Tachycardia 04/07/2013  . Abdominal bloating 04/07/2013  . Neuropathic pain 11/20/2012  . URI, acute 08/21/2012  . Hip pain, left 11/17/2011  . Wrist pain, right 10/27/2010  . Weakness of both legs 10/27/2010  . Cramp of limb 07/09/2009  . B12 deficiency 09/16/2008  . EYE PAIN 02/07/2008  . Sinusitis, chronic 02/07/2008  . Allergic rhinitis 08/06/2007  . Left carotid bruit 08/06/2007  . Vitamin D deficiency 04/26/2007  . Peptic ulcer 04/26/2007  . Osteoarthritis 04/26/2007  . Headache 04/26/2007  .  Hyperlipidemia 01/11/2007  . ANEMIA-IRON DEFICIENCY 01/11/2007  . GASTRIC ULCER, ACUTE, HEMORRHAGE 01/11/2007  . DUODENITIS 01/11/2007      Expected Discharge Date: Expected Discharge Date: 05/29/17   Team Members Present: Physician leading conference: Dr. Alysia Penna Social Worker Present: Ovidio Kin, LCSW Nurse Present: Arelia Sneddon, RN PT Present: Phylliss Bob, PTA;Barrie Folk, PT OT Present: Napoleon Form, OT SLP Present: Stormy Fabian, SLP PPS Coordinator present : Daiva Nakayama, RN, CRRN       Current Status/Progress Goal Weekly Team Focus  Medical     low endurance, BPs variable  reduce fall risk and recurrent CVA risk  BP and pain management   Bowel/Bladder     continent of b/b, LBM--1/4 after supp  maintain b/b with mod assist  monitor b/b q shift and prn   Swallow/Nutrition/ Hydration     Supervision dys 2 and thin, trials dys3 Mod A  Mod I  carryover swallow strategies, trials of dys 3   ADL's     Mod A stand pivot transfers; max A LB dressing; min A UB dressing; supervision grooming  Min A overall  Activity tolerance, R Neuro re-ed, ADL re-training, functional transfers   Mobility     modA bed mobility, modA squat pivot, mod/maxA stand pivot with RW, gait 61ft with RW maxA  min assist overall  L NMR, standing balance, gait, transfers,    Communication     Min- Supervision A  Supervision A  carryover of stratgeies in conversation /sentence level   Safety/Cognition/ Behavioral Observations   Min A  Supervision  selective attention, problem solving, safety awareness and anticipatory awareness   Pain     scheduled tramadol and prn for back pain  maintain pain <2 with mod assist  monitor q shift and prn   Skin     bruises to left face/left arm, healing, no other skin issues  maintain skin with mod assist  monitor skin q shift and prn     *See Care Plan and progress notes for long and short-term goals.      Barriers to Discharge   Current Status/Progress  Possible Resolutions Date Resolved   Physician     Other (comments)  prior hx of recurrent falls and fracture  pain control imprving  Cont CIR, pain management      Nursing                 PT                    OT                 SLP (P) Decreased caregiver support (P) Pt will need hired caregivers 24 hours per day           SW              Discharge Planning/Teaching Needs:  Home with family and hired caregivers providing 24 hr care. Daughter's have been here daily but not sure if completely understand pt's care. Leitha Bleak does understand pt's care needs.      Team Discussion:  Goals min assist level. Back pain managed with tramadol. Dys 2 thin trials of Dys 3 today with speech. Working on balance, attention, and safety awareness. Fatigues easily therapy team building in rest breaks each day. Poor po intake MD watching and monitoring. Family aware will need 24 hr care upon discharge. Pursuing options.  Revisions to Treatment Plan:  DC 1/22    Continued Need for Acute Rehabilitation Level of Care: The patient requires daily medical management by a physician with specialized training in physical medicine and rehabilitation for the following conditions: Daily direction of a multidisciplinary physical rehabilitation program to ensure safe treatment while eliciting the highest outcome that is of practical value to the patient.: Yes Daily medical management of patient stability for increased activity during participation in an intensive rehabilitation regime.: Yes Daily analysis of laboratory values and/or radiology reports with any subsequent need for medication adjustment of medical intervention for : Neurological problems;Other   Elease Hashimoto 05/17/2017, 12:42 PM                 Patient ID: Theresa Hampton, female   DOB: 01/23/1920, 83 y.o.   MRN: [id number]

## 2017-05-23 NOTE — Progress Notes (Signed)
Physical Therapy Session Note  Patient Details  Name: Theresa Hampton MRN: [id number] Date of Birth: February 07, 1920  Today's Date: 05/23/2017 PT Individual Time: 9379-0240 PT Individual Time Calculation (min): 45 min   Short Term Goals: Week 1:  PT Short Term Goal 1 (Week 1): Pt will perform bed mobility with mod assist consistently  PT Short Term Goal 1 - Progress (Week 1): Met PT Short Term Goal 2 (Week 1): Pt will perform bed<>WC transfer with mod assist  PT Short Term Goal 2 - Progress (Week 1): Met PT Short Term Goal 3 (Week 1): Pt will ambulate 57f with max assist of 1 with LRAD  PT Short Term Goal 3 - Progress (Week 1): Met PT Short Term Goal 4 (Week 1): Pt will propell WC 1044fwith min assist  PT Short Term Goal 4 - Progress (Week 1): Met Week 2:  PT Short Term Goal 1 (Week 2): Pt will perform bed mobility with supervision assist  PT Short Term Goal 2 (Week 2): Pt will consistently performed WC<>bed transfers with min assist  PT Short Term Goal 3 (Week 2): Pt will ambulate >15069fonsistently with min assist  PT Short Term Goal 4 (Week 2): Pt will ascend 4 steps with min assist for improved community access.  Week 3:     Skilled Therapeutic Interventions/Progress Updates:   Pt received sitting in WC and agreeable to PT  Gait training with RW [id number] 32f supervision assist from PT. Min cues for AD management and step height on the L in turns. Pt noted to have significantly improved gait pattern and gait speed on this day compared to previous sessions.   Car transfers with RW and supervision assist from PT. Min cues for AD management into and out of car.   Transfer training with RW from various heights with supervision assist. Min cues from PT for proper UE placement.   Gait training with Rollator, min-supervision assist. Pt noted to have decreased step length, decreased cadence, reduced foot clearance, and increased tunk flexion when walking with Rollator compared  to RW  Patient returned to room and [facility] reach and all needs met.        Therapy Documentation Precautions:  Precautions Precautions: Fall Precaution Comments: L painful wrist and saccum  Restrictions Weight Bearing Restrictions: No Pain:   0/10   See Function Navigator for Current Functional Status.   Therapy/Group: Individual Therapy  AustinLorie Phenix2019, 10:09 AM

## 2017-05-23 NOTE — Progress Notes (Addendum)
Social Work Patient ID: Theresa Hampton, female   DOB: 1919/10/07, 84 y.o.   MRN: [id number]  Met with pt and spoke with both sister's via telephone to inform of pt meeting her goals and discharge date being moved up to 1/19, team conference discussion.  RN evaluator from [place] will be here tomorrow to see pt and get paperwork completed. Will work on discharge plans for Sat, daughter's at [employer] there supervising the movers. All in agreement with the plan.

## 2017-05-23 NOTE — Progress Notes (Signed)
Subjective/Complaints:  Had a good night , very tired after therapy , walked to gym yesterday  ROS: Denies CP, SOB, no N/V/D  Objective: Vital Signs: Blood pressure 140/69, pulse 98, temperature 98.9 F (37.2 C), temperature source Oral, resp. rate 16, height _0  (1.651 m), weight 53.1 kg (117 lb 2.6 oz), SpO2 94 %. No results found. Results for orders placed or performed during the hospital encounter of 05/09/17 (from the past 72 hour(s))  Basic metabolic panel     Status: Abnormal   Collection Time: 05/22/17  7:30 AM  Result Value Ref Range   Sodium 138 135 - 145 mmol/L   Potassium 4.2 3.5 - 5.1 mmol/L   Chloride 104 101 - 111 mmol/L   CO2 26 22 - 32 mmol/L   Glucose, Bld 102 (H) 65 - 99 mg/dL   BUN 10 6 - 20 mg/dL   Creatinine, Ser 0.74 0.44 - 1.00 mg/dL   Calcium 9.0 8.9 - 10.3 mg/dL   GFR calc non Af Amer >60 >60 mL/min   GFR calc Af Amer >60 >60 mL/min    Comment: (NOTE) The eGFR has been calculated using the CKD EPI equation. This calculation has not been validated in all clinical situations. eGFR's persistently <60 mL/min signify possible Chronic Kidney Disease.    Anion gap 8 5 - 15  CBC     Status: Abnormal   Collection Time: 05/22/17  7:30 AM  Result Value Ref Range   WBC 7.4 4.0 - 10.5 K/uL   RBC 3.86 (L) 3.87 - 5.11 MIL/uL   Hemoglobin 10.9 (L) 12.0 - 15.0 g/dL   HCT 34.5 (L) 36.0 - 46.0 %   MCV 89.4 78.0 - 100.0 fL   MCH 28.2 26.0 - 34.0 pg   MCHC 31.6 30.0 - 36.0 g/dL   RDW 12.8 11.5 - 15.5 %   Platelets 364 150 - 400 K/uL     HEENT: left periorbital ecchymosis, improving Cardio: RRR and no JVD Resp: CTA B/L and unlabored GI: BS positive and ND Skin:   Bruise left periorbital Neuro: Alert/Oriented Motor: 3/5 Left delt, bi, tri, grip (unchanged) Musc/Skel:  Edema, tenderness improving to left wrist  Gen NAD. Vital signs reviewed.  Assessment/Plan: 1. Functional deficits secondary to Right pontine infarct with left hemiparesis which require 3+  hours per day of interdisciplinary therapy in a comprehensive inpatient rehab setting. Physiatrist is providing close team supervision and 24 hour management of active medical problems listed below. Physiatrist and rehab team continue to assess barriers to discharge/monitor patient progress toward functional and medical goals. FIM: Function - Bathing Position: Wheelchair/chair at sink Body parts bathed by patient: Left arm, Abdomen, Front perineal area, Right arm, Chest(Declined LB; supervision UB) Body parts bathed by helper: Buttocks, Right lower leg, Left lower leg, Back Assist Level: Supervision or verbal cues  Function- Upper Body Dressing/Undressing What is the patient wearing?: Pull over shirt/dress Pull over shirt/dress - Perfomed by patient: Thread/unthread right sleeve, Put head through opening, Pull shirt over trunk, Thread/unthread left sleeve Pull over shirt/dress - Perfomed by helper: Thread/unthread left sleeve, Put head through opening, Pull shirt over trunk Assist Level: Supervision or verbal cues Function - Lower Body Dressing/Undressing What is the patient wearing?: Maryln Manuel, Shoes Position: Wheelchair/chair at [employer]- Performed by patient: Thread/unthread right pants leg Pants- Performed by helper: Thread/unthread right pants leg, Thread/unthread left pants leg, Pull pants up/down Non-skid slipper socks- Performed by patient: Don/doff right sock Non-skid slipper socks- Performed by helper: Don/doff  left sock Socks - Performed by helper: Don/doff right sock, Don/doff left sock Shoes - Performed by patient: Don/doff right shoe, Don/doff left shoe Shoes - Performed by helper: Fasten right, Fasten left TED Hose - Performed by helper: Don/doff right TED hose, Don/doff left TED hose Assist for footwear: Partial/moderate assist Assist for lower body dressing: Touching or steadying assistance (Pt > 75%)  Function - Toileting Toileting activity did not occur: No continent  bowel/bladder event Toileting steps completed by patient: Adjust clothing prior to toileting, Performs perineal hygiene, Adjust clothing after toileting Toileting steps completed by helper: Adjust clothing after toileting Toileting Assistive Devices: Grab bar or rail Assist level: More than reasonable time  Function - Air cabin crew transfer assistive device: Elevated toilet seat/BSC over toilet, Grab bar, Walker Assist level to toilet: Supervision or verbal cues Assist level from toilet: Supervision or verbal cues  Function - Chair/bed transfer Chair/bed transfer method: Stand pivot Chair/bed transfer assist level: Touching or steadying assistance (Pt > 75%) Chair/bed transfer assistive device: Walker Chair/bed transfer details: Verbal cues for technique, Verbal cues for sequencing, Verbal cues for safe use of DME/AE  Function - Locomotion: Wheelchair Type: Manual Max wheelchair distance: 15f  Assist Level: Maximal assistance (Pt 25 - 49%) Wheel 50 feet with 2 turns activity did not occur: Safety/medical concerns Assist Level: Supervision or verbal cues Wheel 150 feet activity did not occur: Safety/medical concerns Turns around,maneuvers to table,bed, and toilet,negotiates 3% grade,maneuvers on rugs and over doorsills: No Function - Locomotion: Ambulation Ambulation activity did not occur: N/A Assistive device: Walker-rolling Max distance: 1634f Assist level: Touching or steadying assistance (Pt > 75%) Walk 10 feet activity did not occur: N/A Assist level: Touching or steadying assistance (Pt > 75%) Walk 50 feet with 2 turns activity did not occur: N/A Assist level: Touching or steadying assistance (Pt > 75%) Walk 150 feet activity did not occur: N/A Assist level: Touching or steadying assistance (Pt > 75%) Walk 10 feet on uneven surfaces activity did not occur: N/A  Function - Comprehension Comprehension: Auditory Comprehension assistive device: Hearing  aids Comprehension assist level: Follows complex conversation/direction with extra time/assistive device  Function - Expression Expression: Verbal Expression assist level: Expresses complex 90% of the time/cues < 10% of the time  Function - Social Interaction Social Interaction assist level: Interacts appropriately with others - No medications needed.  Function - Problem Solving Problem solving assist level: Solves complex 90% of the time/cues < 10% of the time  Function - Memory Memory assist level: More than reasonable amount of time Patient normally able to recall (first 3 days only): Current season, Staff names and faces, That he or she is in a hospital, Location of own room  Medical Problem List and Plan: 1.  Deficits with mobility, self-care, problem solving secondary to right pontine stroke   Cont CIR PT, OT goals upgraded to Sup Team conference today please see physician documentation under team conference tab, met with team face-to-face to discuss problems,progress, and goals. Formulized individual treatment plan based on medical history, underlying problem and comorbidities. 2.  DVT Prophylaxis/Anticoagulation: Pharmaceutical: Lovenox 3. Pain Management: Ice to left wrist and lower back. Continue Voltaren gel to help with myalgias.Add sportscreme to back      Encourage use of left wrist splint.  4. Mood: Team to provide ego support. Continue Remeron with prn Klonopin.   LCSW to follow for evaluation and support.  5. Neuropsych: This patient is capable of making decisions on her own behalf. 6.  Skin/Wound Care: Routine pressure relief measures. Maintain adequate nutritional and hydration status.  7. Fluids/Electrolytes/Nutrition: Monitor I/O.  8. B 12 deficiency: Continue Supplement 9. HTN with orthostatic hypotension: Monitor BP bid and monitor for orthostatic changes. Will  continue Norvasc for now. Monitor for  Vitals:   05/22/17 1456 05/23/17 0116  BP: (!) 109/55 140/69   Pulse: 92 98  Resp: 18 16  Temp: 97.9 F (36.6 C) 98.9 F (37.2 C)  SpO2: 93% 94%    Overall controlled 1/15 and orthostastis resolved 10. H/o of Iron deficiency anemia:    Baseline 12.2 , now stable at 10.9 on 1/15    Cont to monitor   Negative stool occult blood 11. Dyslipidemia: On Lipitor 12.  Dysphagia Stroke- monitor intake and for signs of asp, SLP   Advanced to D3 thin with full supervision, tolerating diet 13.  Dysarthria due to CVA, Improved 14.  Sleep disturbance takes trazodone, remeron, tramadol and gabapentin at night  LOS (Days) 14 A FACE TO FACE EVALUATION WAS PERFORMED  Charlett Blake 05/23/2017, 10:17 AM

## 2017-05-23 NOTE — Plan of Care (Signed)
Goals upgraded to supervision assist overall due to pt progress. See POC for goal details. Dorian Renfro, OTR/L

## 2017-05-23 NOTE — Patient Care Conference (Signed)
Inpatient RehabilitationTeam Conference and Plan of Care Update Date: 05/23/2017   Time: 10:40 AM    Patient Name: Theresa Hampton      Medical Record Number: [id number]  Date of Birth: 20-Jul-1919 Sex: Female         Room/Bed: 4W24C/4W24C-01 Payor Info: Payor: MEDICARE / Plan: MEDICARE PART A AND B / Product Type: *No Product type* /    Admitting Diagnosis: Rt CVA  Admit Date/Time:  05/09/2017  3:14 PM Admission Comments: No comment available   Primary Diagnosis:  <principal problem not specified> Principal Problem: <principal problem not specified>  Patient Active Problem List   Diagnosis Date Noted  . Dysarthria, post-stroke   . Sleep disturbance   . Orthostatic hypotension   . Right pontine stroke ([place]) 05/09/2017  . Tenosynovitis   . Benign essential HTN   . Chronic kidney disease   . Acute blood loss anemia   . Left wrist pain   . Dysphagia, post-stroke   . Cognitive deficit, post-stroke   . Acute left-sided weakness   . Facial droop 05/06/2017  . Stroke (cerebrum) ([place]) 05/06/2017  . Fatigue 07/29/2015  . Acute sinusitis 03/10/2015  . OA (osteoarthritis) of hip 01/06/2015  . Preop exam for internal medicine 12/16/2014  . HTN (hypertension) 06/09/2014  . Grief 06/09/2014  . Hypokalemia 06/05/2014  . Menopausal disorder 04/07/2014  . Acute pyelonephritis 04/09/2013  . Generalized anxiety disorder 04/09/2013  . Nausea alone 04/07/2013  . Tachycardia 04/07/2013  . Abdominal bloating 04/07/2013  . Neuropathic pain 11/20/2012  . URI, acute 08/21/2012  . Hip pain, left 11/17/2011  . Wrist pain, right 10/27/2010  . Weakness of both legs 10/27/2010  . Cramp of limb 07/09/2009  . B12 deficiency 09/16/2008  . EYE PAIN 02/07/2008  . Sinusitis, chronic 02/07/2008  . Allergic rhinitis 08/06/2007  . Left carotid bruit 08/06/2007  . Vitamin D deficiency 04/26/2007  . Peptic ulcer 04/26/2007  . Osteoarthritis 04/26/2007  . Headache 04/26/2007  . Hyperlipidemia  01/11/2007  . ANEMIA-IRON DEFICIENCY 01/11/2007  . GASTRIC ULCER, ACUTE, HEMORRHAGE 01/11/2007  . DUODENITIS 01/11/2007    Expected Discharge Date: Expected Discharge Date: 05/26/17  Team Members Present: Physician leading conference: Dr. Alysia Penna Social Worker Present: Ovidio Kin, LCSW Nurse Present: Dorthula Nettles, RN PT Present: Barrie Folk, PT OT Present: Napoleon Form, OT SLP Present: Charolett Bumpers, SLP PPS Coordinator present : Daiva Nakayama, RN, CRRN     Current Status/Progress Goal Weekly Team Focus  Medical   Endurance improved, Left side now 4/5  upgraded goals Sup  d/c planning   Bowel/Bladder   continent of bowel and bladder, LBM 05-22-17   Remain continent of bowel and bladder  Assist with toileting needs prn   Swallow/Nutrition/ Hydration   upgrade dys 3 and thn, supervision A  Mod I  carryover swallow strategies    ADL's   Supervision- occasional min A overll; min A LB dressing  Upgraded to supervision overall  Functional activity tolerance, L neuro re-ed, ADL/IADl re-training   Mobility   min guard/supervision bed mobiity, minA to min guard sit/stand transfers, gait min guard up to 195ft  min assist overall  L NMR, LLE strengthening, gait   Communication   Supervision A sentence, Min A conversation   Supervision A  carryover strategies in conversation    Safety/Cognition/ Behavioral Observations  Min-Supervision A  supervision   selective attention, problem solving, safety awraeness and anticipatory awareness   Pain   pain managed with scheduled tramadol and prn  for back pain  maintain pain <2 with mod assist  Assess pain q shift and prn   Skin   bruises to left face, left arm   no new skin issues   Assess skin q shift and prn      *See Care Plan and progress notes for long and short-term goals.     Barriers to Discharge  Current Status/Progress Possible Resolutions Date Resolved   Physician    Decreased caregiver support     has exceeded  goals  May be d/ced with 24/7 sup      Nursing                  PT                    OT                  SLP                SW                Discharge Planning/Teaching Needs:  Family planning on having pt go to [facility] with hired caregiver's from rehab. RN to come in this week and evaluate pt for their facility.       Team Discussion:  Progressing and exceeding her goals therapy team has upgraded her goals to supervision level. L-LE stronger and gait is more functional. Continue to work on attention and problem solving. Will move up discharge date to 1/19 due to reaching goals sooner than expected. Diet upgraded to Dys 3 thin  Revisions to Treatment Plan:  DC 1/19 upgraded goals to supervision level    Continued Need for Acute Rehabilitation Level of Care: The patient requires daily medical management by a physician with specialized training in physical medicine and rehabilitation for the following conditions: Daily direction of a multidisciplinary physical rehabilitation program to ensure safe treatment while eliciting the highest outcome that is of practical value to the patient.: Yes Daily medical management of patient stability for increased activity during participation in an intensive rehabilitation regime.: Yes Daily analysis of laboratory values and/or radiology reports with any subsequent need for medication adjustment of medical intervention for : Neurological problems  Elease Hashimoto 05/23/2017, 12:39 PM

## 2017-05-23 NOTE — Progress Notes (Addendum)
Occupational Therapy Session Note  Patient Details  Name: Theresa Hampton MRN: [id number] Date of Birth: 1920-01-17  Today's Date: 05/23/2017 OT Individual Time: 1300-1400 OT Individual Time Calculation (min): 60 min    Short Term Goals: Week 2:  OT Short Term Goal 1 (Week 2): Pt will complete 2/3 toileting task with mod steadying assist in order to decrease caregiver burden OT Short Term Goal 2 (Week 2): Pt will stand at sink to complete 2 grooming tasks in order to increase functional activity tolerance OT Short Term Goal 3 (Week 2): Pt will complete LB dressing with mod A OT Short Term Goal 4 (Week 2): Pt will use L UE at non-dominant level during grooming tasks with no more than 2 VCs for initiation  Skilled Therapeutic Interventions/Progress Updates:    Pt seen for OT session focusing on functional ambulation, fine motor coordination, and d/c planning. Pt in supine upon arrival with personal care giver present. Pt agreeable to tx session. Discussed at length d/c planning, her caregiver took pictures of her new apartment at ALF and discussed set-up for optimal performance and no need for DME due to raised toilet seat, grab bars, etc.  She ambulated throughout session with RW and supervision, she was able to self cue to raise L LE entirely as to not drag foot when fatigued and self initiated rest breaks.  Seated EOM in gym, completed fine motor activity focusing on in hand manipulation and stereogenosis abilities to complete task with vision occluded. Pt completed all tasks with L UE mod I.  Completed 9 hole peg test, see results below. Pt returned to room in same manner as described above. Desired to rst in supine until hand off to PT. Cont to provide education and examples for HEP of fine motor activities though pt with minimal deficits R vs. L at this time.  9 Hole Peg Test:  R: 34.01 sec, 37.12 sec, and 35.06 sec L: 45.59 sec, 46.72 sec, and 39.52 sec  Therapy  Documentation Precautions:  Precautions Precautions: Fall Precaution Comments: L painful wrist and saccum  Restrictions Weight Bearing Restrictions: No Pain:   No/ denies pain ADL: ADL ADL Comments: see functional navigator  See Function Navigator for Current Functional Status.   Therapy/Group: Individual Therapy  Mayah Urquidi L 05/23/2017, 7:12 AM

## 2017-05-24 ENCOUNTER — Inpatient Hospital Stay (HOSPITAL_COMMUNITY): Payer: Medicare Other | Admitting: Occupational Therapy

## 2017-05-24 ENCOUNTER — Inpatient Hospital Stay (HOSPITAL_COMMUNITY): Payer: Medicare Other | Admitting: Physical Therapy

## 2017-05-24 ENCOUNTER — Ambulatory Visit (HOSPITAL_COMMUNITY): Payer: Medicare Other | Admitting: Physical Therapy

## 2017-05-24 ENCOUNTER — Inpatient Hospital Stay (HOSPITAL_COMMUNITY): Payer: Medicare Other | Admitting: Speech Pathology

## 2017-05-24 DIAGNOSIS — I69354 Hemiplegia and hemiparesis following cerebral infarction affecting left non-dominant side: Principal | ICD-10-CM

## 2017-05-24 NOTE — Progress Notes (Signed)
Subjective/Complaints:  No issues overnite pt aware of new  ROS: Denies CP, SOB, no N/V/D  Objective: Vital Signs: Blood pressure (!) 127/49, pulse 81, temperature 98 F (36.7 C), temperature source Oral, resp. rate 17, height 5' 5" (1.651 m), weight 53.2 kg (117 lb 4.6 oz), SpO2 96 %. No results found. Results for orders placed or performed during the hospital encounter of 05/09/17 (from the past 72 hour(s))  Basic metabolic panel     Status: Abnormal   Collection Time: 05/22/17  7:30 AM  Result Value Ref Range   Sodium 138 135 - 145 mmol/L   Potassium 4.2 3.5 - 5.1 mmol/L   Chloride 104 101 - 111 mmol/L   CO2 26 22 - 32 mmol/L   Glucose, Bld 102 (H) 65 - 99 mg/dL   BUN 10 6 - 20 mg/dL   Creatinine, Ser 0.74 0.44 - 1.00 mg/dL   Calcium 9.0 8.9 - 10.3 mg/dL   GFR calc non Af Amer >60 >60 mL/min   GFR calc Af Amer >60 >60 mL/min    Comment: (NOTE) The eGFR has been calculated using the CKD EPI equation. This calculation has not been validated in all clinical situations. eGFR's persistently <60 mL/min signify possible Chronic Kidney Disease.    Anion gap 8 5 - 15  CBC     Status: Abnormal   Collection Time: 05/22/17  7:30 AM  Result Value Ref Range   WBC 7.4 4.0 - 10.5 K/uL   RBC 3.86 (L) 3.87 - 5.11 MIL/uL   Hemoglobin 10.9 (L) 12.0 - 15.0 g/dL   HCT 34.5 (L) 36.0 - 46.0 %   MCV 89.4 78.0 - 100.0 fL   MCH 28.2 26.0 - 34.0 pg   MCHC 31.6 30.0 - 36.0 g/dL   RDW 12.8 11.5 - 15.5 %   Platelets 364 150 - 400 K/uL     HEENT: left periorbital ecchymosis, improving Cardio: RRR and no JVD Resp: CTA B/L and unlabored GI: BS positive and ND Skin:   Bruise left periorbital Neuro: Alert/Oriented Motor: 3/5 Left delt, bi, tri, grip (unchanged) Musc/Skel:  Edema, tenderness improving to left wrist  Gen NAD. Vital signs reviewed.  Assessment/Plan: 1. Functional deficits secondary to Right pontine infarct with left hemiparesis which require 3+ hours per day of  interdisciplinary therapy in a comprehensive inpatient rehab setting. Physiatrist is providing close team supervision and 24 hour management of active medical problems listed below. Physiatrist and rehab team continue to assess barriers to discharge/monitor patient progress toward functional and medical goals. FIM: Function - Bathing Position: Wheelchair/chair at sink Body parts bathed by patient: Left arm, Abdomen, Front perineal area, Right arm, Chest(Declined LB; supervision UB) Body parts bathed by helper: Buttocks, Right lower leg, Left lower leg, Back Assist Level: Supervision or verbal cues  Function- Upper Body Dressing/Undressing What is the patient wearing?: Pull over shirt/dress Pull over shirt/dress - Perfomed by patient: Thread/unthread right sleeve, Put head through opening, Pull shirt over trunk, Thread/unthread left sleeve Pull over shirt/dress - Perfomed by helper: Thread/unthread left sleeve, Put head through opening, Pull shirt over trunk Assist Level: Supervision or verbal cues Function - Lower Body Dressing/Undressing What is the patient wearing?: Maryln Manuel, Shoes Position: Wheelchair/chair at [employer]- Performed by patient: Thread/unthread right pants leg Pants- Performed by helper: Thread/unthread right pants leg, Thread/unthread left pants leg, Pull pants up/down Non-skid slipper socks- Performed by patient: Don/doff right sock Non-skid slipper socks- Performed by helper: Don/doff left sock Socks - Performed by  helper: Don/doff right sock, Don/doff left sock Shoes - Performed by patient: Don/doff right shoe, Don/doff left shoe Shoes - Performed by helper: Fasten right, Fasten left TED Hose - Performed by helper: Don/doff right TED hose, Don/doff left TED hose Assist for footwear: Partial/moderate assist Assist for lower body dressing: Touching or steadying assistance (Pt > 75%)  Function - Toileting Toileting activity did not occur: No continent bowel/bladder  event Toileting steps completed by patient: Adjust clothing prior to toileting, Performs perineal hygiene, Adjust clothing after toileting Toileting steps completed by helper: Adjust clothing after toileting Toileting Assistive Devices: Grab bar or rail Assist level: More than reasonable time  Function - Air cabin crew transfer assistive device: Elevated toilet seat/BSC over toilet, Grab bar, Walker Assist level to toilet: Supervision or verbal cues Assist level from toilet: Supervision or verbal cues  Function - Chair/bed transfer Chair/bed transfer method: Ambulatory Chair/bed transfer assist level: Touching or steadying assistance (Pt > 75%) Chair/bed transfer assistive device: Walker Chair/bed transfer details: Verbal cues for technique, Verbal cues for sequencing, Verbal cues for safe use of DME/AE  Function - Locomotion: Wheelchair Type: Manual Max wheelchair distance: 59f  Assist Level: Maximal assistance (Pt 25 - 49%) Wheel 50 feet with 2 turns activity did not occur: Safety/medical concerns Assist Level: Supervision or verbal cues Wheel 150 feet activity did not occur: Safety/medical concerns Turns around,maneuvers to table,bed, and toilet,negotiates 3% grade,maneuvers on rugs and over doorsills: No Function - Locomotion: Ambulation Ambulation activity did not occur: N/A Assistive device: Walker-rolling Max distance: >150 ft Assist level: Touching or steadying assistance (Pt > 75%) Walk 10 feet activity did not occur: N/A Assist level: Touching or steadying assistance (Pt > 75%) Walk 50 feet with 2 turns activity did not occur: N/A Assist level: Touching or steadying assistance (Pt > 75%) Walk 150 feet activity did not occur: N/A Assist level: Touching or steadying assistance (Pt > 75%) Walk 10 feet on uneven surfaces activity did not occur: N/A  Function - Comprehension Comprehension: Auditory Comprehension assistive device: Hearing aids Comprehension  assist level: Follows complex conversation/direction with extra time/assistive device  Function - Expression Expression: Verbal Expression assist level: Expresses complex 90% of the time/cues < 10% of the time  Function - Social Interaction Social Interaction assist level: Interacts appropriately with others - No medications needed.  Function - Problem Solving Problem solving assist level: Solves complex 90% of the time/cues < 10% of the time  Function - Memory Memory assist level: More than reasonable amount of time Patient normally able to recall (first 3 days only): Current season, Staff names and faces, That he or she is in a hospital, Location of own room  Medical Problem List and Plan: 1.  Deficits with mobility, self-care, problem solving secondary to right pontine stroke   Cont CIR PT, OT goals upgraded to Sup, moved up D/C to 1/19 accomplished goals earlier than expected  2.  DVT Prophylaxis/Anticoagulation: Pharmaceutical: Lovenox 3. Pain Management: Ice to left wrist and lower back. Continue Voltaren gel to help with myalgias.Add sportscreme to back      Encourage use of left wrist splint.  4. Mood: Team to provide ego support. Continue Remeron with prn Klonopin.   LCSW to follow for evaluation and support.  5. Neuropsych: This patient is capable of making decisions on her own behalf. 6. Skin/Wound Care: Routine pressure relief measures. Maintain adequate nutritional and hydration status.  7. Fluids/Electrolytes/Nutrition: Monitor I/O.  8. B 12 deficiency: Continue Supplement 9. HTN with orthostatic  hypotension: Monitor BP bid and monitor for orthostatic changes. Will  continue Norvasc for now. Monitor for  Vitals:   05/23/17 1556 05/24/17 0525  BP: 129/64 (!) 127/49  Pulse: 90 81  Resp: 16 17  Temp: 98.2 F (36.8 C) 98 F (36.7 C)  SpO2: 96% 96%    Overall controlled 1/15 and orthostasis resolved 10. H/o of Iron deficiency anemia:    Baseline 12.2 , now stable at  10.9 on 1/15    Cont to monitor   Negative stool occult blood 11. Dyslipidemia: On Lipitor 12.  Dysphagia Stroke- monitor intake and for signs of asp, SLP   Advanced to D3 thin with full supervision, tolerating diet 13.  Dysarthria due to CVA, Improved 14.  Sleep disturbance takes trazodone, remeron, tramadol and gabapentin at night  LOS (Days) 15 A FACE TO FACE EVALUATION WAS PERFORMED  Charlett Blake 05/24/2017, 8:13 AM

## 2017-05-24 NOTE — Discharge Summary (Signed)
Physician Discharge Summary  Patient ID: Theresa Hampton MRN: [id number] DOB/AGE: 07-05-1919 84 y.o.  Admit date: 05/09/2017 Discharge date: 05/25/2017  Discharge Diagnoses:  Principal Problem:   Right pontine stroke [facility]) Active Problems:   ANEMIA-IRON DEFICIENCY   Generalized anxiety disorder   Dysphagia, post-stroke   Benign essential HTN   Dysarthria, post-stroke   Sleep disturbance   Orthostatic hypotension   Discharged Condition:  Stable   Significant Diagnostic Studies: Dg Sacrum/coccyx  Result Date: 05/06/2017 CLINICAL DATA:  Buttock pain EXAM: SACRUM AND COCCYX - 2+ VIEW COMPARISON:  None. FINDINGS: Pelvic ring is intact. Bilateral hip replacement is noted. Irregularity is noted in the distal sacrum consistent with prior fracture. This is of indeterminate age. No other focal abnormality is noted. Marland Kitchen IMPRESSION: Distal sacral fracture of uncertain age. Electronically Signed   By: Theresa Hampton M.D.   On: 05/06/2017 12:43    Mr Theresa Hampton Neck W Wo Contrast  Result Date: 05/06/2017 CLINICAL DATA:  Stroke right pons EXAM: MRA NECK WITHOUT AND WITH CONTRAST MRA HEAD WITHOUT CONTRAST TECHNIQUE: Multiplanar and multiecho pulse sequences of the neck were obtained without and with intravenous contrast. Angiographic images of the neck were obtained using MRA technique without and with intravenous contast.; Angiographic images of the Circle of Willis were obtained using MRA technique without intravenous contrast. CONTRAST:  48mL MULTIHANCE GADOBENATE DIMEGLUMINE 529 MG/ML IV SOLN COMPARISON:  MRI head 05/06/2017 FINDINGS: MRA NECK FINDINGS Normal aortic arch. Antegrade flow in the carotid and vertebral arteries bilaterally. Atherosclerotic disease and mild stenosis at the origin of the internal carotid artery bilaterally. Moderate stenosis at the origin of the right vertebral artery. Origin of the left vertebral arteries not included on the study and not evaluated. Both vertebral arteries are  patent to the basilar without additional stenosis. MRA HEAD FINDINGS Both vertebral arteries widely patent to the basilar. PICA patent bilaterally. Basilar widely patent. Moderate stenosis left superior cerebellar artery. Right superior cerebellar arteries patent. Mild stenosis right posterior cerebral artery. Left posterior cerebral artery widely patent. Internal carotid artery widely patent bilaterally without stenosis. Anterior and middle cerebral arteries widely patent bilaterally without stenosis. Negative for cerebral aneurysm. IMPRESSION: Mild stenosis at the origin of the carotid artery bilaterally Moderate stenosis at the origin of the right vertebral artery. Origin of left vertebral not included on the study. Moderate stenosis left superior cerebellar artery. Mild stenosis right posterior cerebral artery. Basilar artery widely patent. Electronically Signed   By: Theresa Hampton M.D.   On: 05/06/2017 19:34   Mr Brain Wo Contrast  Result Date: 05/06/2017 CLINICAL DATA:  Focal neuro deficit, greater than 6 hours, stroke suspected. Left-sided weakness and facial droop. EXAM: MRI HEAD WITHOUT CONTRAST TECHNIQUE: Multiplanar, multiecho pulse sequences of the brain and surrounding structures were obtained without intravenous contrast. COMPARISON:  CT head without contrast from the same day. FINDINGS: Brain: The diffusion-weighted images demonstrate a right paramedian pontine infarct measuring 14 mm cephalo caudad. No acute supratentorial infarct is present. Advanced atrophy and confluent diffuse white matter disease is present otherwise. There is a remote lacunar infarct involving the right lentiform nucleus. Ischemic changes are present in the thalami bilaterally. Mild white matter changes extend into the brainstem otherwise. T2 signal changes associated with the acute/ subacute infarct. The cerebellum is normal. Vascular: Flow is present in the major intracranial arteries. Skull and upper cervical spine:  The skullbase is within normal limits. Midline sagittal structures are unremarkable. The craniocervical junction is within normal limits. Sinuses/Orbits: Diffuse opacification is  present in the ethmoid air cells bilaterally. Right maxillary antrostomy is noted. There is mild mucosal thickening involving the maxillary sinuses bilaterally. Anterior sphenoid mucosal thickening is present. Mild mucosal thickening is present in the frontal sinuses bilaterally. There is some fluid in the mastoid air cells bilaterally. No obstructing nasopharyngeal lesion is present. IMPRESSION: 1. Acute/subacute nonhemorrhagic linear infarct involving the right paramedian pons, corresponding with the patient's left-sided weakness. 2. No acute supratentorial infarct. 3. Advanced atrophy and diffuse white matter disease likely reflects the sequela of chronic microvascular ischemia. 4. Moderate paranasal sinus disease, predominantly involving the ethmoids. Electronically Signed   By: Theresa Hampton M.D.   On: 05/06/2017 12:34   Dg Hand Complete Left  Result Date: 05/07/2017 CLINICAL DATA:  Pain in the first metacarpal and thumb. No known injury. EXAM: LEFT HAND - COMPLETE 3+ VIEW COMPARISON:  None. FINDINGS: The bones are demineralized. There is no evidence of acute fracture or dislocation. There are moderate degenerative changes throughout the interphalangeal joints, greatest at third PIP joint. There are moderate scaphotrapeziotrapezoidal degenerative changes. No erosive changes are identified. There is focal prominence of the soft tissues in the radial aspect of wrist. IMPRESSION: 1. Radial wrist soft tissue swelling/prominence. In the absence of trauma, this could reflect a ganglion or deQuervain's tenosynovitis. 2. 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 Electronically Signed   By: Theresa Hampton M.D.   On: 05/07/2017 14:40    Labs:  Basic Metabolic Panel: BMP Latest Ref Rng  & Units 05/22/2017 05/16/2017 05/12/2017  Glucose 65 - 99 mg/dL 102(H) 150(H) 92  BUN 6 - 20 mg/dL 10 11 11   Creatinine 0.44 - 1.00 mg/dL 0.74 0.67 0.72  Sodium 135 - 145 mmol/L 138 137 137  Potassium 3.5 - 5.1 mmol/L 4.2 3.8 4.0  Chloride 101 - 111 mmol/L 104 101 101  CO2 22 - 32 mmol/L 26 27 26   Calcium 8.9 - 10.3 mg/dL 9.0 9.2 9.0    CBC: CBC Latest Ref Rng & Units 05/22/2017 05/16/2017 05/11/2017  WBC 4.0 - 10.5 K/uL 7.4 9.5 4.6  Hemoglobin 12.0 - 15.0 g/dL 10.9(L) 11.4(L) 10.0(L)  Hematocrit 36.0 - 46.0 % 34.5(L) 36.4 31.5(L)  Platelets 150 - 400 K/uL 364 324 248    CBG: No results for input(s): GLUCAP in the last 168 hours.   Brief HPI:   Theresa Feliz Hutchisonis a 84 y.o.left handed femalewith history of HTN, CKD, OA, B-12 deficiency, one week history of difficulty walking progressing to increased falls.She was admitted on 12/30/18AMafter found by family with left facial droop. MRI brain revealed right pontine stroke with moderate stenosis at origin of R-VA and moderate stenosis L-SCA. Neurology recommended changing ASA to Plavix for stroke likely due to SVD. She had reports of buttock pain as well as left wrist pain at admission and  X rays of pelvis showed evidence of prior sacral fracture and left wrist films showed moderate degenerative changes with soft tissue swelling distal wrist. Patient with resultant deficits in functional mobility, dysphagia as well as difficulty completing ADLs.  CIR was recommended due to functional deficits.    Hospital Course: Theresa Hampton was admitted to rehab 05/09/2017 for inpatient therapies to consist of PT, ST and OT at least three hours five days a week. Past admission physiatrist, therapy team and rehab RN have worked together to provide customized collaborative inpatient rehab. She had issues with orthostatic changes that have resolved with improvement in activity tolerance. Blood pressures are controlled on low dose Norvasc. She  was encouraged to  increase fluid intake and has renal status   Diet has been advanced to dysphagia 3, thin liquids and she is tolerating this without signs or symptoms of aspiration.    Mood has been stable and insomnia has improved with use of gabapentin and tramadol at bedtime. Left wrist and lower back pain have improved with local measures.  She was noted to have drop in H/H to 10.0 at admission and stool guaiac was negative for occult blood. Follow up CBC showed improvement in H/H and platelets are stable. She has made good gains during her rehab stay and is at supervision level at discharge. Family has elected on Assisted Living and she will continue to receive follow up HHPT, [place] and     Rehab course: During patient's stay in rehab weekly team conferences were held to monitor patient's progress, set goals and discuss barriers to discharge. She required max assist with ADL tasks and mobility.  She exhibited moderate dysphagia with moderate flaccid dysarthria affecting speech intelligibility.  She has had improvement in activity tolerance, balance, postural control, as well as ability to compensate for deficits. She is has had improvement in functional use LUE  and LLE as well as improvement in awareness and coordination.  She is able to complete ADL tasks with supervision.  She is able perform transfers with supervision and is able to ambulate 150-180' with RW and supervision. Speech ineligibility has improved and she is tolerating current diet without oral dysphagia and with recommendations to advance to regular textures at discharge.     Disposition:  Home.  Diet: Heart Healthy.   Special Instructions: 1. Needs supervision with activity.  2. Use ice or heat to left hip and wrist as need.    Discharge Instructions    Ambulatory referral to Physical Medicine Rehab   Complete by:  As directed    1-2 weeks transitional care appt     Allergies as of 05/25/2017      Reactions   Lidocaine Hcl    seizure       Medication List    STOP taking these medications   estradiol 0.05 mg/24hr patch Commonly known as:  CLIMARA - Dosed in mg/24 hr     TAKE these medications   acetaminophen 325 MG tablet Commonly known as:  TYLENOL Take 2 tablets (650 mg total) by mouth every 6 (six) hours as needed.   amLODipine 2.5 MG tablet Commonly known as:  NORVASC Take 1 tablet (2.5 mg total) by mouth daily.   atorvastatin 40 MG tablet Commonly known as:  LIPITOR Take 1 tablet (40 mg total) by mouth daily at 6 PM.   Cholecalciferol 2000 units Tabs Take 1 tablet (2,000 Units total) by mouth at bedtime. What changed:  how much to take   clonazePAM 0.25 MG disintegrating tablet Commonly known as:  KLONOPIN Take 1 tablet (0.25 mg total) by mouth 2 (two) times daily as needed (anxiety). What changed:  See the new instructions.   clopidogrel 75 MG tablet Commonly known as:  PLAVIX Take 1 tablet (75 mg total) by mouth daily.   cyanocobalamin 500 MCG tablet Take 1 tablet (500 mcg total) by mouth daily.   diclofenac sodium 1 % Gel Commonly known as:  VOLTAREN Apply 2 g topically 4 (four) times daily.   fluticasone 50 MCG/ACT nasal spray Commonly known as:  FLONASE Place 1 spray into both nostrils daily as needed for allergies or rhinitis. What changed:    how to  take this  when to take this  reasons to take this   gabapentin 100 MG capsule Commonly known as:  NEURONTIN TAKE 1 CAPSULE BY MOUTH AT BEDTIME What changed:  additional instructions   loratadine 10 MG tablet Commonly known as:  CLARITIN Take 1 tablet (10 mg total) by mouth daily.   LUMIGAN 0.01 % Soln Generic drug:  bimatoprost instill 1 drop IN EACH EYE AT BEDTIME   mirtazapine 15 MG tablet Commonly known as:  REMERON TAKE ONE TABLET BY MOUTH EVERY EVENING BEFORE DINNER AT 4 - 5 PM   multivitamins with iron Tabs tablet Take 1 tablet by mouth daily.   MUSCLE RUB 10-15 % Crea Apply 1 application topically 2 (two) times  daily as needed for muscle pain (BACK PAIN).   senna-docusate 8.6-50 MG tablet Commonly known as:  Senokot-S Take 1 tablet by mouth at bedtime as needed for moderate constipation.   traMADol 50 MG tablet--Rx # 30 pills Commonly known as:  ULTRAM Take 1 tablet (50 mg total) by mouth at bedtime. What changed:  You were already taking a medication with the same name, and this prescription was added. Make sure you understand how and when to take each.   traMADol 50 MG tablet--Rx # 10 pills  Commonly known as:  ULTRAM Take 0.5 tablets (25 mg total) by mouth every 12 (twelve) hours as needed for severe pain. What changed:    how much to take  when to take this  reasons to take this  additional instructions   traZODone 50 MG tablet Commonly known as:  DESYREL Take 0.5 tablets (25 mg total) by mouth at bedtime as needed for sleep.      Follow-up Information    Kirsteins, Luanna Salk, MD Follow up.   Specialty:  Physical Medicine and Rehabilitation Why:  Office will call with follow up appointment Contact information: [place] [address] [phone]        Dennie Bible, NP. Call in 1 day(s).   Specialty:  Family Medicine Why:  for follow up appointment in 4 weeks Contact information: [address] [place] [address] [phone]        Plotnikov, Evie Lacks, MD Follow up.   Specialty:  Internal Medicine Contact information: [place] [address] [phone]           Signed: Bary Leriche 05/25/2017, 9:54 AM

## 2017-05-24 NOTE — Progress Notes (Signed)
Speech Language Pathology Discharge Summary  Patient Details  Name: Theresa Hampton MRN: [id number] Date of Birth: 10-14-1919  Today's Date: 05/24/2017 SLP Individual Time: 1415-1500 SLP Individual Time Calculation (min): 45 min   Skilled Therapeutic Interventions:  Skilled treatment session focused on completion of education regarding communication goals and dysphagia goals. Pt has demonstrated ability to consume all trials of food textures available with no overt oral phase dysphagia. Would recommend upgrade to regular once pt is at [facility] when food can be prepared more individualized.      Patient has met 5 of 5 long term goals.  Patient to discharge at overall Supervision level.    Clinical Impression/Discharge Summary:   Pt has made great progress during skilled ST sessions and as a result she has met 5 of 5 LTGs. Pt would benefit from brief follow up ST services to better evaluate pt's ability to chew food prepared within facility and to assess cognitive ability to process new environment.   Care Partner:  Caregiver Able to Provide Assistance: Yes  Type of Caregiver Assistance: Cognitive  Recommendation:  Home Health SLP;24 hour supervision/assistance  Rationale for SLP Follow Up: Maximize functional communication;Maximize swallowing safety   Equipment:     Reasons for discharge: Discharged from hospital   Patient/Family Agrees with Progress Made and Goals Achieved: Yes   Function:    Cognition Comprehension Comprehension assist level: Follows complex conversation/direction with extra time/assistive device  Expression   Expression assist level: Expresses complex ideas: With extra time/assistive device  Social Interaction Social Interaction assist level: Interacts appropriately with others - No medications needed.  Problem Solving Problem solving assist level: Solves complex problems: With extra time  Memory Memory assist level: More than reasonable amount of  time   Deandra Goering 05/24/2017, 3:13 PM

## 2017-05-24 NOTE — Progress Notes (Signed)
Physical Therapy Discharge Summary  Patient Details  Name: Theresa Hampton MRN: [id number] Date of Birth: 1919-11-02  Today's Date: 05/24/2017 PT Individual Time:1630-1700   30 min    Patient has met 10 of 10 long term goals due to improved activity tolerance, improved balance, improved postural control, increased strength, increased range of motion, decreased pain, ability to compensate for deficits, functional use of  left upper extremity and left lower extremity, improved attention, improved awareness and improved coordination.  Patient to discharge at an ambulatory level Supervision.   Patient's care partner is independent to provide the necessary physical assistance at discharge.  Reasons goals not met: All PT goals met.   Recommendation:  Patient will benefit from ongoing skilled PT services in home health setting to continue to advance safe functional mobility, address ongoing impairments in balance, transfer, endurance, strength, safety, and minimize fall risk.  Equipment: RW and WC  Reasons for discharge: treatment goals met and discharge from hospital  Patient/family agrees with progress made and goals achieved: Yes   PT treatment: Pt received sitting in WC and agreeable to PT. PT instructed pt in Grad day assessment to measure progress toward goals. See below for details. Patient returned to room and left sitting in [facility] with call bell in reach and all needs met.      PT Discharge Precautions/Restrictions Restrictions Weight Bearing Restrictions: No Vital Signs Therapy Vitals Temp: 97.8 F (36.6 C) Temp Source: Oral Pulse Rate: 87 Resp: 18 BP: (!) 122/58 Patient Position (if appropriate): Sitting Oxygen Therapy SpO2: 96 % O2 Device: Not Delivered Pain 0/10  Cognition Overall Cognitive Status: Within Functional Limits for tasks assessed Arousal/Alertness: Awake/alert Orientation Level: Oriented X4 Sensation Sensation Light Touch: Appears Intact Hot/Cold:  Appears Intact Proprioception: Appears Intact Coordination Gross Motor Movements are Fluid and Coordinated: Yes Fine Motor Movements are Fluid and Coordinated: Yes Heel Shin Test: decreased speed on the L  Motor  Motor Motor: Hemiplegia Motor - Discharge Observations: mild L hemiplegia  Mobility Bed Mobility Bed Mobility: Rolling Right;Rolling Left;Supine to Sit;Sit to Supine Rolling Right: 5: Supervision Rolling Left: 5: Supervision Right Sidelying to Sit: 5: Supervision Supine to Sit: 5: Supervision Sit to Supine: 5: Supervision Transfers Transfers: Yes Sit to Stand: 5: Supervision Stand to Sit: 5: Supervision Stand Pivot Transfers: 5: Supervision(with RW. car transfer completed with stanfd pivot technique and supervision assist. ) Locomotion  Ambulation Ambulation: Yes Ambulation/Gait Assistance: 5: Supervision Ambulation Distance (Feet): 200 Feet Assistive device: Rolling walker Gait Gait: Yes Gait Pattern: Impaired Gait Pattern: Narrow base of support;Poor foot clearance - left Stairs / Additional Locomotion Stairs: Yes Stairs Assistance: 4: Min assist Stair Management Technique: Two rails Number of Stairs: 12 Height of Stairs: 6 Wheelchair Mobility Wheelchair Mobility: Yes Wheelchair Assistance: 5: Supervision Distance: 155f   Trunk/Postural Assessment  Cervical Assessment Cervical Assessment: Within Functional Limits Thoracic Assessment Thoracic Assessment: Exceptions to WFL(kyphotic) Lumbar Assessment Lumbar Assessment: Exceptions to WFL(Posterior pelvic tilt) Postural Control Postural Control: Within Functional Limits  Balance Balance Balance Assessed: Yes Dynamic Sitting Balance Dynamic Sitting - Balance Support: During functional activity;Feet supported Dynamic Sitting - Level of Assistance: 6: Modified independent (Device/Increase time) Sitting balance - Comments: Sitting to complete bathing task in shower Static Standing Balance Static  Standing - Balance Support: During functional activity Static Standing - Level of Assistance: 5: Stand by assistance Static Standing - Comment/# of Minutes: Standing to complete LB bathing/dressing Dynamic Standing Balance Dynamic Standing - Balance Support: During functional activity Dynamic Standing - Level  of Assistance: 5: Stand by assistance Dynamic Standing - Comments: Standing to complete LB bathing/dressing Extremity Assessment  RUE Assessment RUE Assessment: Within Functional Limits LUE Assessment LUE Assessment: Within Functional Limits(4-5/ strength throughout) RLE Assessment RLE Assessment: Within Functional Limits(Grossly 4/5 to 4+/5, generizled weakness ) LLE Assessment LLE Assessment: Exceptions to [facility] LLE Strength LLE Overall Strength Comments: grossly 4/5 proximal to distal except hip abduction 4-/5 and knee flexion 4-/5.    See Function Navigator for Current Functional Status.  Lorie Phenix 05/24/2017, 5:02 PM

## 2017-05-24 NOTE — Progress Notes (Signed)
Social Work Patient ID: Langston Reusing, female   DOB: 1919-10-23, 84 y.o.   MRN: [id number]   Met with pt, daughter, son in-law, caregiver and evaluator from [place] to discuss needs and questions. Plan now to move discharge to tomorrow so transition is smooth to [place]. Paperwork to be completed by MD and home health arranged via [facility] along with wheelchair and rolling walker via [place]. Family to provide transportation to facility tomorrow. They are aware she will require 24 hr supervision. MD and PA aware of changed in discharge and in agreement, along with therapy team. See in am for last minute questions.

## 2017-05-24 NOTE — Discharge Instructions (Signed)
Inpatient Rehab Discharge Instructions  Theresa Hampton Discharge date and time:  05/25/17  Activities/Precautions/ Functional Status: Activity: activity as tolerated Diet: cardiac diet--soft textures Wound Care: none needed   Functional status:  ___ No restrictions     ___ Walk up steps independently _X__ 24/7 supervision/assistance   ___ Walk up steps with assistance ___ Intermittent supervision/assistance  ___ Bathe/dress independently ___ Walk with walker     ___ Bathe/dress with assistance ___ Walk Independently    ___ Shower independently _X__ Walk with assistance    _X__ Shower with assistance _X__ No alcohol     ___ Return to work/school ________   Special Instructions:    COMMUNITY REFERRALS UPON DISCHARGE:    Home Health:   PT, OT, SP   Agency:[facility]   [phone]   Date of last service:05/25/2017  Medical Equipment/Items [place]   [phone]   GENERAL COMMUNITY RESOURCES FOR PATIENT/FAMILY: Support Groups:[facility] SUPPORT GROUP  EVERY SECOND Thursday @ 3:00-4:00 PM ON THE REHAB UNIT QUESTIONS CONTACT [place] [phone]   STROKE/TIA DISCHARGE INSTRUCTIONS SMOKING Cigarette smoking nearly doubles your risk of having a stroke & is the single most alterable risk factor  If you smoke or have smoked in the last 12 months, you are advised to quit smoking for your health.  Most of the excess cardiovascular risk related to smoking disappears within a year of stopping.  Ask you doctor about anti-smoking medications  [place] Quit Line: 1-800-QUIT NOW  Free Smoking Cessation Classes [phone]  CHOLESTEROL Know your levels; limit fat & cholesterol in your diet  Lipid Panel     Component Value Date/Time   CHOL 217 (H) 05/07/2017 0330   TRIG 94 05/07/2017 0330   HDL 65 05/07/2017 0330   CHOLHDL 3.3 05/07/2017 0330   VLDL 19 05/07/2017 0330   LDLCALC 133 (H) 05/07/2017 0330      Many  patients benefit from treatment even if their cholesterol is at goal.  Goal: Total Cholesterol (CHOL) less than 160  Goal:  Triglycerides (TRIG) less than 150  Goal:  HDL greater than 40  Goal:  LDL (LDLCALC) less than 100   BLOOD PRESSURE American Stroke Association blood pressure target is less that 120/80 mm/Hg  Your discharge blood pressure is:  BP: 130/61  Monitor your blood pressure  Limit your salt and alcohol intake  Many individuals will require more than one medication for high blood pressure  DIABETES (A1c is a blood sugar average for last 3 months) Goal HGBA1c is under 7% (HBGA1c is blood sugar average for last 3 months)  Diabetes: No known diagnosis of diabetes    Lab Results  Component Value Date   HGBA1C 5.3 05/07/2017     Your HGBA1c can be lowered with medications, healthy diet, and exercise.  Check your blood sugar as directed by your physician  Call your physician if you experience unexplained or low blood sugars.  PHYSICAL ACTIVITY/REHABILITATION Goal is 30 minutes at least 4 days per week  Activity: No driving, Therapies:  See above Return to work: N/A  Activity decreases your risk of heart attack and stroke and makes your heart stronger.  It helps control your weight and blood pressure; helps you relax and can improve your mood.  Participate in a regular exercise program.  Talk with your doctor about the best form of exercise for you (dancing, walking, swimming, cycling).  DIET/WEIGHT Goal is to maintain a healthy weight  Your discharge  diet is: DIET DYS 2 Room service appropriate? Yes; Fluid consistency: Thin DIET DYS 3 Room service appropriate? Yes; Fluid consistency: Thin  liquids Your height is:  Height: 5\' 5"  (165.1 cm) Your current weight is: Weight: 57.3 kg (126 lb 5.2 oz) Your Body Mass Index (BMI) is:  BMI (Calculated): 21.02  Following the type of diet specifically designed for you will help prevent another stroke.  You are at  goal  weight.  Your goal Body Mass Index (BMI) is 19-24.  Healthy food habits can help reduce 3 risk factors for stroke:  High cholesterol, hypertension, and excess weight.  RESOURCES Stroke/Support Group:  Call [phone]   STROKE EDUCATION PROVIDED/REVIEWED AND GIVEN TO PATIENT Stroke warning signs and symptoms How to activate emergency medical system (call 911). Medications prescribed at discharge. Need for follow-up after discharge. Personal risk factors for stroke. Pneumonia vaccine given:  Flu vaccine given:  My questions have been answered, the writing is legible, and I understand these instructions.  I will adhere to these goals & educational materials that have been provided to me after my discharge from the hospital.     My questions have been answered and I understand these instructions. I will adhere to these goals and the provided educational materials after my discharge from the hospital.  Patient/Caregiver Signature _______________________________ Date __________  Clinician Signature _______________________________________ Date __________  Please bring this form and your medication list with you to all your follow-up doctor's appointments.

## 2017-05-24 NOTE — Progress Notes (Signed)
Occupational Therapy Discharge Summary  Patient Details  Name: Theresa Hampton MRN: [id number] Date of Birth: 1920-03-18   Patient has met 3 of 11 long term goals due to improved activity tolerance, improved balance, postural control, functional use of  LEFT upper extremity, improved awareness and improved coordination.  Patient to discharge at overall Supervision level.  Patient to d/c to ALF, planned for 24hr caregiver at this time.    Recommendation:  Patient will benefit from ongoing skilled OT services in home health setting to continue to advance functional skills in the area of BADL, iADL and Reduce care partner burden.  Equipment: No equipment provided  Reasons for discharge: treatment goals met and discharge from hospital  Patient/family agrees with progress made and goals achieved: Yes  OT Discharge Precautions/Restrictions  Precautions Precautions: Fall Restrictions Weight Bearing Restrictions: No ADL ADL ADL Comments: see functional navigator Vision Baseline Vision/History: Wears glasses Wears Glasses: Reading only Patient Visual Report: No change from baseline Vision Assessment?: No apparent visual deficits Perception  Perception: Within Functional Limits Cognition Overall Cognitive Status: Within Functional Limits for tasks assessed Arousal/Alertness: Awake/alert Orientation Level: Oriented X4 Sustained Attention: Appears intact Memory: Appears intact Awareness: Appears intact Problem Solving: Appears intact Safety/Judgment: Appears intact Sensation Sensation Light Touch: Appears Intact Hot/Cold: Appears Intact Proprioception: Appears Intact Coordination Gross Motor Movements are Fluid and Coordinated: Yes Fine Motor Movements are Fluid and Coordinated: Yes Finger Nose Finger Test: L: 45.59 sec, 46.72 sec, and 39.52 sec   R: 34.01 sec, 37.12 sec, and 35.06 sec Motor  Motor Motor: (P) Hemiplegia Mobility     Trunk/Postural Assessment   Cervical Assessment Cervical Assessment: Within Functional Limits Thoracic Assessment Thoracic Assessment: Exceptions to WFL(kyphotic) Lumbar Assessment Lumbar Assessment: Exceptions to WFL(Posterior pelvic tilt) Postural Control Postural Control: Within Functional Limits  Balance Balance Balance Assessed: Yes Dynamic Sitting Balance Dynamic Sitting - Balance Support: During functional activity;Feet supported Dynamic Sitting - Level of Assistance: 6: Modified independent (Device/Increase time) Sitting balance - Comments: Sitting to complete bathing task in shower Static Standing Balance Static Standing - Balance Support: During functional activity Static Standing - Level of Assistance: 5: Stand by assistance Static Standing - Comment/# of Minutes: Standing to complete LB bathing/dressing Dynamic Standing Balance Dynamic Standing - Balance Support: During functional activity Dynamic Standing - Level of Assistance: 5: Stand by assistance Dynamic Standing - Comments: Standing to complete LB bathing/dressing Extremity/Trunk Assessment RUE Assessment RUE Assessment: Within Functional Limits LUE Assessment LUE Assessment: Within Functional Limits(4-5/ strength throughout)   See Function Navigator for Current Functional Status.  Wisdom Rickey L 05/24/2017, 3:23 PM

## 2017-05-24 NOTE — Progress Notes (Signed)
Physical Therapy Session Note  Patient Details  Name: Theresa Hampton MRN: [id number] Date of Birth: 1919/12/20  Today's Date: 05/24/2017 PT Individual Time: 0800-0920 PT Individual Time Calculation (min): 80 min   Short Term Goals: Week 2:  PT Short Term Goal 1 (Week 2): Pt will perform bed mobility with supervision assist  PT Short Term Goal 2 (Week 2): Pt will consistently performed WC<>bed transfers with min assist  PT Short Term Goal 3 (Week 2): Pt will ambulate >129f consistently with min assist  PT Short Term Goal 4 (Week 2): Pt will ascend 4 steps with min assist for improved community access.  Week 3:     Skilled Therapeutic Interventions/Progress Updates:   Pt received supine in bed and agreeable to PT. Supine>sit transfer with supervision assist.   Gait training instructed by PT [id number] supervision assist and RW. Min cues for safety in turns.   Standing balance training with Wii fit board to improved use of hip and ankle strategies for weight shifting. Penguin slide x 4, tilt table x 2 and bubble run x 2. Min assist throughout to improved posture and increased anterior/L weigh shift.   Nustep reciprocal movement training x 8 minutes, BLE only at level 3>4. Cues for improved glute activaiton on the L to improve hip alignment.   Patient returned to room and left sitting in WC [facility] call bell in reach and all needs met.         Therapy Documentation Precautions:  Precautions Precautions: Fall Precaution Comments: L painful wrist and saccum  Restrictions Weight Bearing Restrictions: No Vital Signs: Therapy Vitals Temp: 98 F (36.7 C) Temp Source: Oral Pulse Rate: 81 Resp: 17 BP: (!) 127/49 Patient Position (if appropriate): Sitting Oxygen Therapy SpO2: 96 % O2 Device: Not Delivered Pain: 0/10   See Function Navigator for Current Functional Status.   Therapy/Group: Individual Therapy  AusLorie Phenix17/2019, 9:17 AM

## 2017-05-24 NOTE — Progress Notes (Signed)
Occupational Therapy Session Note  Patient Details  Name: Theresa Hampton MRN: [id number] Date of Birth: [birth year]  Today's Date: 05/24/2017 OT Individual Time: 6803-2122 OT Individual Time Calculation (min): 58 min    Short Term Goals: Week 2:  OT Short Term Goal 1 (Week 2): Pt will complete 2/3 toileting task with mod steadying assist in order to decrease caregiver burden OT Short Term Goal 2 (Week 2): Pt will stand at sink to complete 2 grooming tasks in order to increase functional activity tolerance OT Short Term Goal 3 (Week 2): Pt will complete LB dressing with mod A OT Short Term Goal 4 (Week 2): Pt will use Hampton UE at non-dominant level during grooming tasks with no more than 2 VCs for initiation  Skilled Therapeutic Interventions/Progress Updates:    Pt seen for OT ADL bathing/dressing session. Pt sitting up in w/c upon arrival with RN present administering morning meds. Pt voicing increased fatigue, however, willing to attempt therapy and continue with planned shower for this morning. She ambulated throughout room with supervision to gather clothing items in prep for shower, able to bend to low drawers to obtain items. She ambulated into shower with VCs for RW management in functional context. She bathed seated on 3-1 BSC in shower, able to cross B LEs into figure four position in order to wash feet. She returned to w/c to dress, standing without AD while pulling up pants and steadying assist provided.  She requested to return to supine to rest following shower, unable to cont with session. Pt left in supine with all needs in reach, personal care giver and daughter present.   Therapy Documentation Precautions:  Precautions Precautions: Fall Precaution Comments: Hampton painful wrist and saccum  Restrictions Weight Bearing Restrictions: No Pain:   No/ denies pain ADL: ADL ADL Comments: see functional navigator  See Function Navigator for Current Functional  Status.   Therapy/Group: Individual Therapy  Theresa Hampton 05/24/2017, 6:52 AM

## 2017-05-25 ENCOUNTER — Inpatient Hospital Stay (HOSPITAL_COMMUNITY): Payer: Medicare Other | Admitting: Occupational Therapy

## 2017-05-25 ENCOUNTER — Inpatient Hospital Stay (HOSPITAL_COMMUNITY): Payer: Medicare Other | Admitting: Speech Pathology

## 2017-05-25 ENCOUNTER — Other Ambulatory Visit: Payer: Self-pay

## 2017-05-25 MED ORDER — TRAMADOL HCL 50 MG PO TABS: 50.0000 mg | ORAL_TABLET | Freq: Every day | ORAL | 0 refills | Status: DC

## 2017-05-25 MED ORDER — ATORVASTATIN CALCIUM 40 MG PO TABS: 40.0000 mg | ORAL_TABLET | Freq: Every day | ORAL | 0 refills | Status: AC

## 2017-05-25 MED ORDER — MIRTAZAPINE 15 MG PO TABS: ORAL_TABLET | ORAL | 1 refills | Status: AC

## 2017-05-25 MED ORDER — MUSCLE RUB 10-15 % EX CREA: 1.0000 "application " | TOPICAL_CREAM | Freq: Two times a day (BID) | CUTANEOUS | 0 refills | Status: AC | PRN

## 2017-05-25 MED ORDER — CLONAZEPAM 0.25 MG PO TBDP: 0.2500 mg | ORAL_TABLET | Freq: Two times a day (BID) | ORAL | 0 refills | Status: DC | PRN

## 2017-05-25 MED ORDER — TAB-A-VITE/IRON PO TABS: 1.0000 | ORAL_TABLET | Freq: Every day | ORAL | 0 refills | Status: AC

## 2017-05-25 MED ORDER — AMLODIPINE BESYLATE 2.5 MG PO TABS: 2.5000 mg | ORAL_TABLET | Freq: Every day | ORAL | 1 refills | Status: DC

## 2017-05-25 MED ORDER — LUMIGAN 0.01 % OP SOLN: OPHTHALMIC | 0 refills | Status: AC

## 2017-05-25 MED ORDER — DICLOFENAC SODIUM 1 % TD GEL: 2.0000 g | Freq: Four times a day (QID) | TRANSDERMAL | 0 refills | Status: DC

## 2017-05-25 MED ORDER — TRAZODONE HCL 50 MG PO TABS: 25.0000 mg | ORAL_TABLET | Freq: Every evening | ORAL | 0 refills | Status: DC | PRN

## 2017-05-25 MED ORDER — CLOPIDOGREL BISULFATE 75 MG PO TABS: 75.0000 mg | ORAL_TABLET | Freq: Every day | ORAL | 0 refills | Status: AC

## 2017-05-25 MED ORDER — ACETAMINOPHEN 325 MG PO TABS: 650.0000 mg | ORAL_TABLET | Freq: Four times a day (QID) | ORAL | Status: AC | PRN

## 2017-05-25 MED ORDER — GABAPENTIN 100 MG PO CAPS: ORAL_CAPSULE | ORAL | 1 refills | Status: AC

## 2017-05-25 MED ORDER — FLUTICASONE PROPIONATE 50 MCG/ACT NA SUSP: 1.0000 | Freq: Every day | NASAL | 3 refills | Status: DC | PRN

## 2017-05-25 MED ORDER — TRAMADOL HCL 50 MG PO TABS: 25.0000 mg | ORAL_TABLET | Freq: Two times a day (BID) | ORAL | 0 refills | Status: DC | PRN

## 2017-05-25 MED ORDER — CYANOCOBALAMIN 500 MCG PO TABS: 500.0000 ug | ORAL_TABLET | Freq: Every day | ORAL | 0 refills | Status: DC

## 2017-05-25 MED ORDER — CHOLECALCIFEROL 50 MCG (2000 UT) PO TABS: 1.0000 | ORAL_TABLET | Freq: Every day | ORAL | 0 refills | Status: DC

## 2017-05-25 NOTE — Progress Notes (Signed)
Social Work  Discharge Note  The overall goal for the admission was met for:   Discharge location: Yes-[facility]  Length of Stay: Yes-16 DAYS  Discharge activity level: Yes-SUPERVISION LEVEL  Home/community participation: Yes  Services provided included: MD, RD, PT, OT, SLP, RN, CM, TR, Pharmacy and SW  Financial Services: Medicare and Private Insurance: COMMERICAL INSURANCE  Follow-up services arranged: Home Health: [facility]-PT,OT,SP, DME: [facility]-ROLLING WALKER & WHEELCHAIR and Patient/Family request agency HH: FACILITY REQUESTED, DME: NO PREF  Comments (or additional information):PT EXCEEDED HER GOALS AND DAUGHTER'S AND PT FELT GOING TO [facility] BEST OPTION FOR HER  Patient/Family verbalized understanding of follow-up arrangements: Yes  Individual responsible for coordination of the follow-up plan: ANNE & KAY-DAUGHTER'S  Confirmed correct DME delivered: ,  G 05/25/2017    ,  G 

## 2017-05-25 NOTE — Progress Notes (Signed)
Discharged     Per wheelchair with daughter to [facility]. Denies any c/o distress.

## 2017-05-25 NOTE — Progress Notes (Signed)
Subjective/Complaints:  No new issues overnite, very happy about rehab outcome  ROS: Denies CP, SOB, no N/V/D  Objective: Vital Signs: Blood pressure 130/80, pulse 87, temperature 98.3 F (36.8 C), temperature source Oral, resp. rate 18, height 5\' 5"  (1.651 m), weight 52.5 kg (115 lb 11.9 oz), SpO2 95 %. No results found. No results found for this or any previous visit (from the past 72 hour(s)).   HEENT: left periorbital ecchymosis, improving Cardio: RRR and no JVD Resp: CTA B/L and unlabored GI: BS positive and ND Skin:   Bruise left periorbital Neuro: Alert/Oriented Motor: 3/5 Left delt, bi, tri, grip (unchanged) Musc/Skel:  Edema, tenderness improving to left wrist  Gen NAD. Vital signs reviewed.  Assessment/Plan: 1. Functional deficits secondary to Right pontine infarct with left hemiparesis Stable for D/C today F/u PCP in 3-4 weeks F/u PM&R 2 weeks See D/C summary See D/C instructions FIM: Function - Bathing Position: Shower Body parts bathed by patient: Left arm, Abdomen, Front perineal area, Right arm, Chest, Right upper leg, Left upper leg, Buttocks, Left lower leg, Right lower leg Body parts bathed by helper: Back Assist Level: Supervision or verbal cues  Function- Upper Body Dressing/Undressing What is the patient wearing?: Pull over shirt/dress Pull over shirt/dress - Perfomed by patient: Thread/unthread right sleeve, Put head through opening, Pull shirt over trunk, Thread/unthread left sleeve Pull over shirt/dress - Perfomed by helper: Thread/unthread left sleeve, Put head through opening, Pull shirt over trunk Assist Level: More than reasonable time Function - Lower Body Dressing/Undressing What is the patient wearing?: Underwear, Pants, Non-skid slipper socks Position: Wheelchair/chair at sink Underwear - Performed by patient: Thread/unthread right underwear leg, Thread/unthread left underwear leg, Pull underwear up/down Pants- Performed by patient:  Thread/unthread right pants leg, Thread/unthread left pants leg, Pull pants up/down Pants- Performed by helper: Thread/unthread right pants leg, Thread/unthread left pants leg, Pull pants up/down Non-skid slipper socks- Performed by patient: Don/doff right sock Non-skid slipper socks- Performed by helper: Don/doff left sock Socks - Performed by helper: Don/doff right sock, Don/doff left sock Shoes - Performed by patient: Don/doff right shoe, Don/doff left shoe Shoes - Performed by helper: Fasten right, Fasten left TED Hose - Performed by helper: Don/doff right TED hose, Don/doff left TED hose Assist for footwear: Supervision/touching assist Assist for lower body dressing: Touching or steadying assistance (Pt > 75%)  Function - Toileting Toileting activity did not occur: No continent bowel/bladder event Toileting steps completed by patient: Adjust clothing prior to toileting, Performs perineal hygiene, Adjust clothing after toileting Toileting steps completed by helper: Adjust clothing after toileting Toileting Assistive Devices: Grab bar or rail Assist level: Supervision or verbal cues  Function - Air cabin crew transfer assistive device: Grab bar, Elevated toilet seat/BSC over toilet, Walker Assist level to toilet: Supervision or verbal cues Assist level from toilet: Supervision or verbal cues  Function - Chair/bed transfer Chair/bed transfer method: Ambulatory Chair/bed transfer assist level: Supervision or verbal cues Chair/bed transfer assistive device: Walker Chair/bed transfer details: Verbal cues for technique, Verbal cues for sequencing, Verbal cues for safe use of DME/AE  Function - Locomotion: Wheelchair Type: Manual Max wheelchair distance: 111ft  Assist Level: Supervision or verbal cues Wheel 50 feet with 2 turns activity did not occur: Safety/medical concerns Assist Level: Supervision or verbal cues Wheel 150 feet activity did not occur: Safety/medical  concerns Turns around,maneuvers to table,bed, and toilet,negotiates 3% grade,maneuvers on rugs and over doorsills: No Function - Locomotion: Ambulation Ambulation activity did not occur: N/A Assistive device:  Walker-rolling Max distance: 265ft  Assist level: Supervision or verbal cues Walk 10 feet activity did not occur: N/A Assist level: Supervision or verbal cues Walk 50 feet with 2 turns activity did not occur: N/A Assist level: Supervision or verbal cues Walk 150 feet activity did not occur: N/A Assist level: Supervision or verbal cues Walk 10 feet on uneven surfaces activity did not occur: N/A Assist level: Supervision or verbal cues  Function - Comprehension Comprehension: Auditory Comprehension assistive device: Hearing aids Comprehension assist level: Follows complex conversation/direction with extra time/assistive device  Function - Expression Expression: Verbal Expression assist level: Expresses complex ideas: With extra time/assistive device  Function - Social Interaction Social Interaction assist level: Interacts appropriately with others - No medications needed.  Function - Problem Solving Problem solving assist level: Solves complex problems: With extra time  Function - Memory Memory assist level: More than reasonable amount of time Patient normally able to recall (first 3 days only): Current season, Staff names and faces, That he or she is in a hospital, Location of own room  Medical Problem List and Plan: 1.  Deficits with mobility, self-care, problem solving secondary to right pontine stroke   Cont CIR PT, OT goals upgraded to Sup, moved up D/C to 1/18   2.  DVT Prophylaxis/Anticoagulation: Pharmaceutical: Lovenox 3. Pain Management: Ice to left wrist and lower back. Continue Voltaren gel to help with myalgias.Add sportscreme to back      Encourage use of left wrist splint.  4. Mood: Team to provide ego support. Continue Remeron with prn Klonopin.   LCSW to  follow for evaluation and support.  5. Neuropsych: This patient is capable of making decisions on her own behalf. 6. Skin/Wound Care: Routine pressure relief measures. Maintain adequate nutritional and hydration status.  7. Fluids/Electrolytes/Nutrition: Monitor I/O.  8. B 12 deficiency: Continue Supplement 9. HTN with orthostatic hypotension: Monitor BP bid and monitor for orthostatic changes. Will  continue Norvasc for now. Monitor for  Vitals:   05/25/17 0533 05/25/17 0739  BP:  130/80  Pulse:    Resp: 18   Temp: 98.3 F (36.8 C)   SpO2: 95%     Overall controlled 1/18 and orthostasis resolved 10. H/o of Iron deficiency anemia:    Baseline 12.2 , now stable at 10.9 on 1/15    Cont to monitor   Negative stool occult blood 11. Dyslipidemia: On Lipitor 12.  Dysphagia Stroke- monitor intake and for signs of asp, SLP   Advanced to D3 thin with full supervision, tolerating diet 13.  Dysarthria due to CVA, Improved 14.  Sleep disturbance takes trazodone, remeron, tramadol and gabapentin at night  LOS (Days) [place] EVALUATION WAS PERFORMED  Charlett Blake 05/25/2017, 8:18 AM

## 2017-05-26 DIAGNOSIS — I69319 Unspecified symptoms and signs involving cognitive functions following cerebral infarction: Secondary | ICD-10-CM | POA: Diagnosis not present

## 2017-05-26 DIAGNOSIS — I69322 Dysarthria following cerebral infarction: Secondary | ICD-10-CM | POA: Diagnosis not present

## 2017-05-26 DIAGNOSIS — I69354 Hemiplegia and hemiparesis following cerebral infarction affecting left non-dominant side: Secondary | ICD-10-CM | POA: Diagnosis not present

## 2017-05-26 DIAGNOSIS — I129 Hypertensive chronic kidney disease with stage 1 through stage 4 chronic kidney disease, or unspecified chronic kidney disease: Secondary | ICD-10-CM | POA: Diagnosis not present

## 2017-05-26 DIAGNOSIS — N189 Chronic kidney disease, unspecified: Secondary | ICD-10-CM | POA: Diagnosis not present

## 2017-05-26 DIAGNOSIS — I69391 Dysphagia following cerebral infarction: Secondary | ICD-10-CM | POA: Diagnosis not present

## 2017-05-28 ENCOUNTER — Telehealth: Payer: Self-pay | Admitting: Internal Medicine

## 2017-05-28 ENCOUNTER — Telehealth: Payer: Self-pay

## 2017-05-28 NOTE — Telephone Encounter (Signed)
Copied from [place] [id number]. Topic: Inquiry >> May 28, 2017 11:47 AM Scherrie Gerlach wrote: Reason for CRM: pt is now at [facility] assisted living. Shiree with [facility] would like verbal orders for home health PT  1 wk /1 3 wk / 3 2 wk / 3

## 2017-05-28 NOTE — Telephone Encounter (Signed)
charese PT [facility] called requesting verbal orders for PT for 1xwk X 1wk, 3xwk X 3wks, and 2xwk X 3wks, called her back and approved verbal orders.

## 2017-05-29 DIAGNOSIS — I69322 Dysarthria following cerebral infarction: Secondary | ICD-10-CM | POA: Diagnosis not present

## 2017-05-29 DIAGNOSIS — I129 Hypertensive chronic kidney disease with stage 1 through stage 4 chronic kidney disease, or unspecified chronic kidney disease: Secondary | ICD-10-CM | POA: Diagnosis not present

## 2017-05-29 DIAGNOSIS — I69319 Unspecified symptoms and signs involving cognitive functions following cerebral infarction: Secondary | ICD-10-CM | POA: Diagnosis not present

## 2017-05-29 DIAGNOSIS — N189 Chronic kidney disease, unspecified: Secondary | ICD-10-CM | POA: Diagnosis not present

## 2017-05-29 DIAGNOSIS — I69391 Dysphagia following cerebral infarction: Secondary | ICD-10-CM | POA: Diagnosis not present

## 2017-05-29 DIAGNOSIS — I69354 Hemiplegia and hemiparesis following cerebral infarction affecting left non-dominant side: Secondary | ICD-10-CM | POA: Diagnosis not present

## 2017-05-29 NOTE — Telephone Encounter (Signed)
Ok Thx 

## 2017-05-30 DIAGNOSIS — I69354 Hemiplegia and hemiparesis following cerebral infarction affecting left non-dominant side: Secondary | ICD-10-CM | POA: Diagnosis not present

## 2017-05-30 DIAGNOSIS — I69319 Unspecified symptoms and signs involving cognitive functions following cerebral infarction: Secondary | ICD-10-CM | POA: Diagnosis not present

## 2017-05-30 DIAGNOSIS — I69391 Dysphagia following cerebral infarction: Secondary | ICD-10-CM | POA: Diagnosis not present

## 2017-05-30 DIAGNOSIS — N189 Chronic kidney disease, unspecified: Secondary | ICD-10-CM | POA: Diagnosis not present

## 2017-05-30 DIAGNOSIS — I129 Hypertensive chronic kidney disease with stage 1 through stage 4 chronic kidney disease, or unspecified chronic kidney disease: Secondary | ICD-10-CM | POA: Diagnosis not present

## 2017-05-30 DIAGNOSIS — I69322 Dysarthria following cerebral infarction: Secondary | ICD-10-CM | POA: Diagnosis not present

## 2017-05-30 NOTE — Telephone Encounter (Signed)
Notified [name] w/MD response...[name]

## 2017-05-31 DIAGNOSIS — I69319 Unspecified symptoms and signs involving cognitive functions following cerebral infarction: Secondary | ICD-10-CM | POA: Diagnosis not present

## 2017-05-31 DIAGNOSIS — I69322 Dysarthria following cerebral infarction: Secondary | ICD-10-CM | POA: Diagnosis not present

## 2017-05-31 DIAGNOSIS — I69391 Dysphagia following cerebral infarction: Secondary | ICD-10-CM | POA: Diagnosis not present

## 2017-05-31 DIAGNOSIS — I69354 Hemiplegia and hemiparesis following cerebral infarction affecting left non-dominant side: Secondary | ICD-10-CM | POA: Diagnosis not present

## 2017-05-31 DIAGNOSIS — I129 Hypertensive chronic kidney disease with stage 1 through stage 4 chronic kidney disease, or unspecified chronic kidney disease: Secondary | ICD-10-CM | POA: Diagnosis not present

## 2017-05-31 DIAGNOSIS — N189 Chronic kidney disease, unspecified: Secondary | ICD-10-CM | POA: Diagnosis not present

## 2017-06-01 DIAGNOSIS — N189 Chronic kidney disease, unspecified: Secondary | ICD-10-CM | POA: Diagnosis not present

## 2017-06-01 DIAGNOSIS — I69391 Dysphagia following cerebral infarction: Secondary | ICD-10-CM | POA: Diagnosis not present

## 2017-06-01 DIAGNOSIS — I69354 Hemiplegia and hemiparesis following cerebral infarction affecting left non-dominant side: Secondary | ICD-10-CM | POA: Diagnosis not present

## 2017-06-01 DIAGNOSIS — I129 Hypertensive chronic kidney disease with stage 1 through stage 4 chronic kidney disease, or unspecified chronic kidney disease: Secondary | ICD-10-CM | POA: Diagnosis not present

## 2017-06-01 DIAGNOSIS — I69322 Dysarthria following cerebral infarction: Secondary | ICD-10-CM | POA: Diagnosis not present

## 2017-06-01 DIAGNOSIS — I69319 Unspecified symptoms and signs involving cognitive functions following cerebral infarction: Secondary | ICD-10-CM | POA: Diagnosis not present

## 2017-06-04 DIAGNOSIS — N189 Chronic kidney disease, unspecified: Secondary | ICD-10-CM | POA: Diagnosis not present

## 2017-06-04 DIAGNOSIS — I69391 Dysphagia following cerebral infarction: Secondary | ICD-10-CM | POA: Diagnosis not present

## 2017-06-04 DIAGNOSIS — I69354 Hemiplegia and hemiparesis following cerebral infarction affecting left non-dominant side: Secondary | ICD-10-CM | POA: Diagnosis not present

## 2017-06-04 DIAGNOSIS — I69319 Unspecified symptoms and signs involving cognitive functions following cerebral infarction: Secondary | ICD-10-CM | POA: Diagnosis not present

## 2017-06-04 DIAGNOSIS — I69322 Dysarthria following cerebral infarction: Secondary | ICD-10-CM | POA: Diagnosis not present

## 2017-06-04 DIAGNOSIS — I129 Hypertensive chronic kidney disease with stage 1 through stage 4 chronic kidney disease, or unspecified chronic kidney disease: Secondary | ICD-10-CM | POA: Diagnosis not present

## 2017-06-05 DIAGNOSIS — I69354 Hemiplegia and hemiparesis following cerebral infarction affecting left non-dominant side: Secondary | ICD-10-CM | POA: Diagnosis not present

## 2017-06-05 DIAGNOSIS — I129 Hypertensive chronic kidney disease with stage 1 through stage 4 chronic kidney disease, or unspecified chronic kidney disease: Secondary | ICD-10-CM | POA: Diagnosis not present

## 2017-06-05 DIAGNOSIS — I69322 Dysarthria following cerebral infarction: Secondary | ICD-10-CM | POA: Diagnosis not present

## 2017-06-05 DIAGNOSIS — I69319 Unspecified symptoms and signs involving cognitive functions following cerebral infarction: Secondary | ICD-10-CM | POA: Diagnosis not present

## 2017-06-05 DIAGNOSIS — I69391 Dysphagia following cerebral infarction: Secondary | ICD-10-CM | POA: Diagnosis not present

## 2017-06-05 DIAGNOSIS — N189 Chronic kidney disease, unspecified: Secondary | ICD-10-CM | POA: Diagnosis not present

## 2017-06-07 DIAGNOSIS — I69319 Unspecified symptoms and signs involving cognitive functions following cerebral infarction: Secondary | ICD-10-CM | POA: Diagnosis not present

## 2017-06-07 DIAGNOSIS — I129 Hypertensive chronic kidney disease with stage 1 through stage 4 chronic kidney disease, or unspecified chronic kidney disease: Secondary | ICD-10-CM | POA: Diagnosis not present

## 2017-06-07 DIAGNOSIS — I69391 Dysphagia following cerebral infarction: Secondary | ICD-10-CM | POA: Diagnosis not present

## 2017-06-07 DIAGNOSIS — I69322 Dysarthria following cerebral infarction: Secondary | ICD-10-CM | POA: Diagnosis not present

## 2017-06-07 DIAGNOSIS — I69354 Hemiplegia and hemiparesis following cerebral infarction affecting left non-dominant side: Secondary | ICD-10-CM | POA: Diagnosis not present

## 2017-06-07 DIAGNOSIS — N189 Chronic kidney disease, unspecified: Secondary | ICD-10-CM | POA: Diagnosis not present

## 2017-06-08 ENCOUNTER — Ambulatory Visit (HOSPITAL_BASED_OUTPATIENT_CLINIC_OR_DEPARTMENT_OTHER): Payer: Medicare Other | Admitting: Physical Medicine & Rehabilitation

## 2017-06-08 ENCOUNTER — Encounter: Payer: Medicare Other | Attending: Physical Medicine & Rehabilitation

## 2017-06-08 ENCOUNTER — Encounter: Payer: Self-pay | Admitting: Physical Medicine & Rehabilitation

## 2017-06-08 VITALS — BP 112/74 | HR 74 | Resp 14

## 2017-06-08 DIAGNOSIS — Z96643 Presence of artificial hip joint, bilateral: Secondary | ICD-10-CM | POA: Diagnosis not present

## 2017-06-08 DIAGNOSIS — N189 Chronic kidney disease, unspecified: Secondary | ICD-10-CM | POA: Diagnosis not present

## 2017-06-08 DIAGNOSIS — I69313 Psychomotor deficit following cerebral infarction: Secondary | ICD-10-CM | POA: Diagnosis not present

## 2017-06-08 DIAGNOSIS — I69391 Dysphagia following cerebral infarction: Secondary | ICD-10-CM | POA: Insufficient documentation

## 2017-06-08 DIAGNOSIS — I129 Hypertensive chronic kidney disease with stage 1 through stage 4 chronic kidney disease, or unspecified chronic kidney disease: Secondary | ICD-10-CM | POA: Diagnosis not present

## 2017-06-08 DIAGNOSIS — Z9181 History of falling: Secondary | ICD-10-CM | POA: Diagnosis not present

## 2017-06-08 DIAGNOSIS — Z8261 Family history of arthritis: Secondary | ICD-10-CM | POA: Insufficient documentation

## 2017-06-08 DIAGNOSIS — J309 Allergic rhinitis, unspecified: Secondary | ICD-10-CM | POA: Insufficient documentation

## 2017-06-08 DIAGNOSIS — R269 Unspecified abnormalities of gait and mobility: Secondary | ICD-10-CM | POA: Diagnosis not present

## 2017-06-08 DIAGNOSIS — R262 Difficulty in walking, not elsewhere classified: Secondary | ICD-10-CM | POA: Diagnosis not present

## 2017-06-08 DIAGNOSIS — M1990 Unspecified osteoarthritis, unspecified site: Secondary | ICD-10-CM | POA: Diagnosis not present

## 2017-06-08 DIAGNOSIS — Z9889 Other specified postprocedural states: Secondary | ICD-10-CM | POA: Diagnosis not present

## 2017-06-08 DIAGNOSIS — M25532 Pain in left wrist: Secondary | ICD-10-CM | POA: Diagnosis not present

## 2017-06-08 DIAGNOSIS — R1310 Dysphagia, unspecified: Secondary | ICD-10-CM | POA: Insufficient documentation

## 2017-06-08 DIAGNOSIS — Z8249 Family history of ischemic heart disease and other diseases of the circulatory system: Secondary | ICD-10-CM | POA: Diagnosis not present

## 2017-06-08 DIAGNOSIS — I69398 Other sequelae of cerebral infarction: Secondary | ICD-10-CM | POA: Diagnosis not present

## 2017-06-08 DIAGNOSIS — I69322 Dysarthria following cerebral infarction: Secondary | ICD-10-CM | POA: Diagnosis not present

## 2017-06-08 DIAGNOSIS — I69319 Unspecified symptoms and signs involving cognitive functions following cerebral infarction: Secondary | ICD-10-CM | POA: Diagnosis not present

## 2017-06-08 DIAGNOSIS — Z809 Family history of malignant neoplasm, unspecified: Secondary | ICD-10-CM | POA: Insufficient documentation

## 2017-06-08 DIAGNOSIS — I6350 Cerebral infarction due to unspecified occlusion or stenosis of unspecified cerebral artery: Secondary | ICD-10-CM

## 2017-06-08 DIAGNOSIS — M810 Age-related osteoporosis without current pathological fracture: Secondary | ICD-10-CM | POA: Insufficient documentation

## 2017-06-08 DIAGNOSIS — I69354 Hemiplegia and hemiparesis following cerebral infarction affecting left non-dominant side: Secondary | ICD-10-CM | POA: Diagnosis not present

## 2017-06-08 NOTE — Patient Instructions (Signed)
Please wear knee hight support hose, apply every am and rmeove every evening at bedtime

## 2017-06-08 NOTE — Progress Notes (Signed)
Subjective:    Patient ID: Theresa Hampton, female    DOB: 1920-01-12, 84 y.o.   MRN: [id number] 84 y.o.left handed femalewith history of HTN, CKD, OA, B-12 deficiency, one week history of difficulty walking progressing to increased falls.She was admitted on 12/30/18AMafter found by family with left facial droop. MRI brain revealed right pontine stroke with moderate stenosis at origin of R-VA and moderate stenosis L-SCA. Neurology recommended changing ASA to Plavix for stroke likely due to SVD. She had reports of buttock pain as well as left wrist pain at admission and  X rays of pelvis showed evidence of prior sacral fracture and left wrist films showed moderate degenerative changes with soft tissue swelling distal wrist. Patient with resultant deficits in functional mobility, dysphagia as well as difficulty completing ADLs.  CIR was recommended due to functional deficits.     HPI  Still requiring assist for LE dressing Walk in shower No falls Has PCP f/u in one week  No spasms No shouler pain Speech therapy d/ced back to baseline.  Pain Inventory Average Pain 0 Pain Right Now 0 My pain is no pain  In the last 24 hours, has pain interfered with the following? General activity 0 Relation with others 0 Enjoyment of life 0 What TIME of day is your pain at its worst? no pain Sleep (in general) Good  Pain is worse with: no pain Pain improves with: no pain Relief from Meds: no pain  Mobility walk with assistance use a walker ability to climb steps?  no do you drive?  no use a wheelchair needs help with transfers  Function retired I need assistance with the following:  dressing, bathing, toileting, meal prep, household duties and shopping  Neuro/Psych weakness trouble walking  Prior Studies hospital f/u  Physicians involved in your care hospital f/u   Family History  Problem Relation Age of Onset  . Arthritis Mother   . Hypertension Unknown   . Cancer  Sister    Social History   Socioeconomic History  . Marital status: Widowed    Spouse name: None  . Number of children: None  . Years of education: None  . Highest education level: None  Social Needs  . Financial resource strain: None  . Food insecurity - worry: None  . Food insecurity - inability: None  . Transportation needs - medical: None  . Transportation needs - non-medical: None  Occupational History  . Occupation: retired  Tobacco Use  . Smoking status: Never Smoker  . Smokeless tobacco: Never Used  Substance and Sexual Activity  . Alcohol use: No  . Drug use: No  . Sexual activity: Not Currently  Other Topics Concern  . None  Social History Narrative  . None   Past Surgical History:  Procedure Laterality Date  . CATARACT EXTRACTION, BILATERAL Bilateral   . DILATION AND CURETTAGE OF UTERUS    . ENDOMETRIAL ABLATION  2008  . JOINT REPLACEMENT    . THYROIDECTOMY, PARTIAL  "many years ago"  . TOTAL HIP ARTHROPLASTY Left 01/06/2015   Procedure: LEFT TOTAL HIP ARTHROPLASTY ANTERIOR APPROACH;  Surgeon: Gaynelle Arabian, MD;  Location: [facility];  Service: Orthopedics;  Laterality: Left;  . TOTAL HIP ARTHROPLASTY Right 2004   Past Medical History:  Diagnosis Date  . Allergic rhinitis   . Arthritis    "hips" (05/08/2017)  . B12 deficiency anemia   . Chronic sinusitis   . Daily headache    "q am" (05/08/2017)  . History of  transfusion 2004   "w/1st hip OR" (05/08/2017)  . Hyperlipidemia   . Hypertension   . Iron deficiency anemia   . Leg cramps   . Osteoarthritis    Dr. Maureen Ralphs  . Osteoporosis   . Peptic ulcer disease 2003  . PONV (postoperative nausea and vomiting)   . Pyelonephritis, acute 2014  . Seasonal allergies   . Seizures (HCC)    x1 with Lidocaine  . Stroke ([place]) 05/05/2017  . Vitamin B12 deficiency   . Vitamin D deficiency    BP 112/74 (BP Location: Right Arm, Patient Position: Sitting, Cuff Size: Normal)   Pulse 74   Resp 14   LMP  (LMP Unknown)    SpO2 95%   Opioid Risk Score:   Fall Risk Score:  `1  Depression screen PHQ 2/9  Depression screen [facility] 2/9 05/23/2016 10/14/2014  Decreased Interest 0 1  Down, Depressed, Hopeless 0 0  PHQ - 2 Score 0 1  ' Review of Systems  HENT: Negative.   Eyes: Negative.   Respiratory: Negative.   Cardiovascular: Positive for leg swelling.       Feet swelling  Gastrointestinal: Negative.   Endocrine: Negative.   Genitourinary: Negative.   Musculoskeletal: Positive for gait problem.  Skin: Negative.   Allergic/Immunologic: Negative.   Neurological: Positive for weakness.  Hematological: Negative.   Psychiatric/Behavioral: Negative.   All other systems reviewed and are negative.      Objective:   Physical Exam        Assessment & Plan:

## 2017-06-11 DIAGNOSIS — I69319 Unspecified symptoms and signs involving cognitive functions following cerebral infarction: Secondary | ICD-10-CM | POA: Diagnosis not present

## 2017-06-11 DIAGNOSIS — I69391 Dysphagia following cerebral infarction: Secondary | ICD-10-CM | POA: Diagnosis not present

## 2017-06-11 DIAGNOSIS — I69354 Hemiplegia and hemiparesis following cerebral infarction affecting left non-dominant side: Secondary | ICD-10-CM | POA: Diagnosis not present

## 2017-06-11 DIAGNOSIS — N189 Chronic kidney disease, unspecified: Secondary | ICD-10-CM | POA: Diagnosis not present

## 2017-06-11 DIAGNOSIS — I69322 Dysarthria following cerebral infarction: Secondary | ICD-10-CM | POA: Diagnosis not present

## 2017-06-11 DIAGNOSIS — I129 Hypertensive chronic kidney disease with stage 1 through stage 4 chronic kidney disease, or unspecified chronic kidney disease: Secondary | ICD-10-CM | POA: Diagnosis not present

## 2017-06-12 DIAGNOSIS — I69391 Dysphagia following cerebral infarction: Secondary | ICD-10-CM | POA: Diagnosis not present

## 2017-06-12 DIAGNOSIS — I129 Hypertensive chronic kidney disease with stage 1 through stage 4 chronic kidney disease, or unspecified chronic kidney disease: Secondary | ICD-10-CM | POA: Diagnosis not present

## 2017-06-12 DIAGNOSIS — I69319 Unspecified symptoms and signs involving cognitive functions following cerebral infarction: Secondary | ICD-10-CM | POA: Diagnosis not present

## 2017-06-12 DIAGNOSIS — I69354 Hemiplegia and hemiparesis following cerebral infarction affecting left non-dominant side: Secondary | ICD-10-CM | POA: Diagnosis not present

## 2017-06-12 DIAGNOSIS — N189 Chronic kidney disease, unspecified: Secondary | ICD-10-CM | POA: Diagnosis not present

## 2017-06-12 DIAGNOSIS — I69322 Dysarthria following cerebral infarction: Secondary | ICD-10-CM | POA: Diagnosis not present

## 2017-06-13 DIAGNOSIS — I69391 Dysphagia following cerebral infarction: Secondary | ICD-10-CM | POA: Diagnosis not present

## 2017-06-13 DIAGNOSIS — N189 Chronic kidney disease, unspecified: Secondary | ICD-10-CM | POA: Diagnosis not present

## 2017-06-13 DIAGNOSIS — I129 Hypertensive chronic kidney disease with stage 1 through stage 4 chronic kidney disease, or unspecified chronic kidney disease: Secondary | ICD-10-CM | POA: Diagnosis not present

## 2017-06-13 DIAGNOSIS — I69322 Dysarthria following cerebral infarction: Secondary | ICD-10-CM | POA: Diagnosis not present

## 2017-06-13 DIAGNOSIS — I69354 Hemiplegia and hemiparesis following cerebral infarction affecting left non-dominant side: Secondary | ICD-10-CM | POA: Diagnosis not present

## 2017-06-13 DIAGNOSIS — I69319 Unspecified symptoms and signs involving cognitive functions following cerebral infarction: Secondary | ICD-10-CM | POA: Diagnosis not present

## 2017-06-14 ENCOUNTER — Ambulatory Visit (INDEPENDENT_AMBULATORY_CARE_PROVIDER_SITE_OTHER): Payer: Medicare Other | Admitting: Internal Medicine

## 2017-06-14 ENCOUNTER — Encounter: Payer: Self-pay | Admitting: Internal Medicine

## 2017-06-14 VITALS — BP 118/74 | HR 81 | Temp 98.0°F | Ht 65.0 in | Wt 123.0 lb

## 2017-06-14 DIAGNOSIS — I6350 Cerebral infarction due to unspecified occlusion or stenosis of unspecified cerebral artery: Secondary | ICD-10-CM | POA: Diagnosis not present

## 2017-06-14 DIAGNOSIS — I6302 Cerebral infarction due to thrombosis of basilar artery: Secondary | ICD-10-CM | POA: Diagnosis not present

## 2017-06-14 DIAGNOSIS — E559 Vitamin D deficiency, unspecified: Secondary | ICD-10-CM | POA: Diagnosis not present

## 2017-06-14 DIAGNOSIS — N3281 Overactive bladder: Secondary | ICD-10-CM | POA: Insufficient documentation

## 2017-06-14 DIAGNOSIS — I10 Essential (primary) hypertension: Secondary | ICD-10-CM

## 2017-06-14 NOTE — Assessment & Plan Note (Signed)
Lopressor and Norvasc 

## 2017-06-14 NOTE — Assessment & Plan Note (Signed)
On Vit D 

## 2017-06-14 NOTE — Patient Instructions (Signed)
We could use Generic Detrol (Tolterodine) for the overactive bladder if covered

## 2017-06-14 NOTE — Assessment & Plan Note (Signed)
L facial droop, ataxia - better

## 2017-06-14 NOTE — Assessment & Plan Note (Signed)
Generic Detrol (Tolterodine) if covered

## 2017-06-14 NOTE — Progress Notes (Signed)
Subjective:  Patient ID: Theresa Hampton, female    DOB: [birth year]  Age: 84 y.o. MRN: [id number]  CC: No chief complaint on file.   HPI CHARESE ABUNDIS presents for post-CVA - recovering at [facility]. F/u OAB, ataxia  Hx: Brief HPI:   Theresa Darius Hutchisonis a 84 y.o.left handed femalewith history of HTN, CKD, OA, B-12 deficiency, one week history of difficulty walking progressing to increased falls.She was admitted on 12/30/18AMafter found by family with left facial droop. MRI brain revealed right pontine stroke with moderate stenosis at origin of R-VA and moderate stenosis L-SCA. Neurology recommended changing ASA to Plavix for stroke likely due to SVD. She had reports of buttock pain as well as left wrist pain at admission and  X rays of pelvis showed evidence of prior sacral fracture and left wrist films showed moderate degenerative changes with soft tissue swelling distal wrist. Patient with resultant deficits in functional mobility, dysphagia as well as difficulty completing ADLs.  CIR was recommended due to functional deficits.    Hospital Course: DEVONNA OBOYLE was admitted to rehab 05/09/2017 for inpatient therapies to consist of PT, ST and OT at least three hours five days a week. Past admission physiatrist, therapy team and rehab RN have worked together to provide customized collaborative inpatient rehab. She had issues with orthostatic changes that have resolved with improvement in activity tolerance. Blood pressures are controlled on low dose Norvasc. She was encouraged to increase fluid intake and has renal status   Diet has been advanced to dysphagia 3, thin liquids and she is tolerating this without signs or symptoms of aspiration.      Outpatient Medications Prior to Visit  Medication Sig Dispense Refill  . amLODipine (NORVASC) 2.5 MG tablet Take 1 tablet (2.5 mg total) by mouth daily. 30 tablet 1  . atorvastatin (LIPITOR) 40 MG tablet Take 1 tablet (40 mg total) by  mouth daily at 6 PM. 30 tablet 0  . Cholecalciferol 2000 units TABS Take 1 tablet (2,000 Units total) by mouth at bedtime. 30 each 0  . clonazePAM (KLONOPIN) 0.25 MG disintegrating tablet Take 1 tablet (0.25 mg total) by mouth 2 (two) times daily as needed (anxiety). 30 tablet 0  . clopidogrel (PLAVIX) 75 MG tablet Take 1 tablet (75 mg total) by mouth daily. 30 tablet 0  . cyanocobalamin 500 MCG tablet Take 1 tablet (500 mcg total) by mouth daily. 30 tablet 0  . diclofenac sodium (VOLTAREN) 1 % GEL Apply 2 g topically 4 (four) times daily. 4 Tube 0  . fluticasone (FLONASE) 50 MCG/ACT nasal spray Place 1 spray into both nostrils daily as needed for allergies or rhinitis. 16 g 3  . gabapentin (NEURONTIN) 100 MG capsule TAKE 1 CAPSULE BY MOUTH AT BEDTIME 30 capsule 1  . LUMIGAN 0.01 % SOLN instill 1 drop IN EACH EYE AT BEDTIME 7.5 mL 0  . mirtazapine (REMERON) 15 MG tablet TAKE ONE TABLET BY MOUTH EVERY EVENING BEFORE DINNER AT 4 - 5 PM 30 tablet 1  . Multiple Vitamins-Iron (MULTIVITAMINS WITH IRON) TABS tablet Take 1 tablet by mouth daily.  0  . traMADol (ULTRAM) 50 MG tablet Take 0.5 tablets (25 mg total) by mouth every 12 (twelve) hours as needed for severe pain. 10 tablet 0  . traZODone (DESYREL) 50 MG tablet Take 0.5 tablets (25 mg total) by mouth at bedtime as needed for sleep. 15 tablet 0  . acetaminophen (TYLENOL) 325 MG tablet Take 2 tablets (650 mg total)  by mouth every 6 (six) hours as needed. (Patient not taking: Reported on 06/14/2017)    . loratadine (CLARITIN) 10 MG tablet Take 1 tablet (10 mg total) by mouth daily. (Patient not taking: Reported on 06/14/2017) 100 tablet 2  . Menthol-Methyl Salicylate (MUSCLE RUB) 10-15 % CREA Apply 1 application topically 2 (two) times daily as needed for muscle pain (BACK PAIN).  0  . senna-docusate (SENOKOT-S) 8.6-50 MG tablet Take 1 tablet by mouth at bedtime as needed for moderate constipation. (Patient not taking: Reported on 06/14/2017) 30 tablet 0    No facility-administered medications prior to visit.     ROS Review of Systems  Constitutional: Positive for fatigue. Negative for activity change, appetite change, chills and unexpected weight change.  HENT: Negative for congestion, mouth sores and sinus pressure.   Eyes: Negative for visual disturbance.  Respiratory: Negative for cough and chest tightness.   Gastrointestinal: Negative for abdominal pain and nausea.  Genitourinary: Negative for difficulty urinating, frequency and vaginal pain.  Musculoskeletal: Positive for gait problem. Negative for back pain.  Skin: Negative for pallor and rash.  Neurological: Negative for dizziness, tremors, weakness, numbness and headaches.  Psychiatric/Behavioral: Negative for confusion and sleep disturbance.    Objective:  BP 118/74 (BP Location: Right Arm, Patient Position: Sitting, Cuff Size: Normal)   Pulse 81   Temp 98 F (36.7 C) (Oral)   Ht 5\' 5"  (1.651 m)   Wt 123 lb (55.8 kg)   LMP  (LMP Unknown)   SpO2 98%   BMI 20.47 kg/m   BP Readings from Last 3 Encounters:  06/14/17 118/74  06/08/17 112/74  05/25/17 130/80    Wt Readings from Last 3 Encounters:  06/14/17 123 lb (55.8 kg)  05/25/17 115 lb 11.9 oz (52.5 kg)  05/09/17 132 lb 7.9 oz (60.1 kg)    Physical Exam  Constitutional: She appears well-developed. No distress.  HENT:  Head: Normocephalic.  Right Ear: External ear normal.  Left Ear: External ear normal.  Nose: Nose normal.  Mouth/Throat: Oropharynx is clear and moist.  Eyes: Conjunctivae are normal. Pupils are equal, round, and reactive to light. Right eye exhibits no discharge. Left eye exhibits no discharge.  Neck: Normal range of motion. Neck supple. No JVD present. No tracheal deviation present. No thyromegaly present.  Cardiovascular: Normal rate, regular rhythm and normal heart sounds.  Pulmonary/Chest: No stridor. No respiratory distress. She has no wheezes.  Abdominal: Soft. Bowel sounds are  normal. She exhibits no distension and no mass. There is no tenderness. There is no rebound and no guarding.  Musculoskeletal: She exhibits no edema or tenderness.  Lymphadenopathy:    She has no cervical adenopathy.  Neurological: She displays normal reflexes. No cranial nerve deficit. She exhibits normal muscle tone. Coordination abnormal.  Skin: No rash noted. No erythema.  Psychiatric: She has a normal mood and affect. Her behavior is normal. Judgment and thought content normal.  in a w/c  Lab Results  Component Value Date   WBC 7.4 05/22/2017   HGB 10.9 (L) 05/22/2017   HCT 34.5 (L) 05/22/2017   PLT 364 05/22/2017   GLUCOSE 102 (H) 05/22/2017   CHOL 217 (H) 05/07/2017   TRIG 94 05/07/2017   HDL 65 05/07/2017   LDLDIRECT 125.4 05/10/2011   LDLCALC 133 (H) 05/07/2017   ALT 16 05/10/2017   AST 25 05/10/2017   NA 138 05/22/2017   K 4.2 05/22/2017   CL 104 05/22/2017   CREATININE 0.74 05/22/2017   BUN  10 05/22/2017   CO2 26 05/22/2017   TSH 3.83 08/01/2016   INR 1.01 05/06/2017   HGBA1C 5.3 05/07/2017    No results found.  Assessment & Plan:   There are no diagnoses linked to this encounter. I am having Lanyiah C. Yin maintain her loratadine, senna-docusate, acetaminophen, MUSCLE RUB, multivitamins with iron, amLODipine, atorvastatin, Cholecalciferol, clonazePAM, cyanocobalamin, clopidogrel, gabapentin, traMADol, traZODone, fluticasone, mirtazapine, LUMIGAN, and diclofenac sodium.  No orders of the defined types were placed in this encounter.    Follow-up: No Follow-up on file.  Walker Kehr, MD

## 2017-06-15 DIAGNOSIS — I69322 Dysarthria following cerebral infarction: Secondary | ICD-10-CM | POA: Diagnosis not present

## 2017-06-15 DIAGNOSIS — I69354 Hemiplegia and hemiparesis following cerebral infarction affecting left non-dominant side: Secondary | ICD-10-CM | POA: Diagnosis not present

## 2017-06-15 DIAGNOSIS — I129 Hypertensive chronic kidney disease with stage 1 through stage 4 chronic kidney disease, or unspecified chronic kidney disease: Secondary | ICD-10-CM | POA: Diagnosis not present

## 2017-06-15 DIAGNOSIS — I69319 Unspecified symptoms and signs involving cognitive functions following cerebral infarction: Secondary | ICD-10-CM | POA: Diagnosis not present

## 2017-06-15 DIAGNOSIS — I69391 Dysphagia following cerebral infarction: Secondary | ICD-10-CM | POA: Diagnosis not present

## 2017-06-15 DIAGNOSIS — N189 Chronic kidney disease, unspecified: Secondary | ICD-10-CM | POA: Diagnosis not present

## 2017-06-18 ENCOUNTER — Encounter: Payer: Self-pay | Admitting: Internal Medicine

## 2017-06-18 DIAGNOSIS — I69319 Unspecified symptoms and signs involving cognitive functions following cerebral infarction: Secondary | ICD-10-CM | POA: Diagnosis not present

## 2017-06-18 DIAGNOSIS — I129 Hypertensive chronic kidney disease with stage 1 through stage 4 chronic kidney disease, or unspecified chronic kidney disease: Secondary | ICD-10-CM | POA: Diagnosis not present

## 2017-06-18 DIAGNOSIS — I69391 Dysphagia following cerebral infarction: Secondary | ICD-10-CM | POA: Diagnosis not present

## 2017-06-18 DIAGNOSIS — N189 Chronic kidney disease, unspecified: Secondary | ICD-10-CM | POA: Diagnosis not present

## 2017-06-18 DIAGNOSIS — I69354 Hemiplegia and hemiparesis following cerebral infarction affecting left non-dominant side: Secondary | ICD-10-CM | POA: Diagnosis not present

## 2017-06-18 DIAGNOSIS — I69322 Dysarthria following cerebral infarction: Secondary | ICD-10-CM | POA: Diagnosis not present

## 2017-06-20 DIAGNOSIS — N189 Chronic kidney disease, unspecified: Secondary | ICD-10-CM | POA: Diagnosis not present

## 2017-06-20 DIAGNOSIS — I69322 Dysarthria following cerebral infarction: Secondary | ICD-10-CM | POA: Diagnosis not present

## 2017-06-20 DIAGNOSIS — I69354 Hemiplegia and hemiparesis following cerebral infarction affecting left non-dominant side: Secondary | ICD-10-CM | POA: Diagnosis not present

## 2017-06-20 DIAGNOSIS — I69391 Dysphagia following cerebral infarction: Secondary | ICD-10-CM | POA: Diagnosis not present

## 2017-06-20 DIAGNOSIS — I69319 Unspecified symptoms and signs involving cognitive functions following cerebral infarction: Secondary | ICD-10-CM | POA: Diagnosis not present

## 2017-06-20 DIAGNOSIS — I129 Hypertensive chronic kidney disease with stage 1 through stage 4 chronic kidney disease, or unspecified chronic kidney disease: Secondary | ICD-10-CM | POA: Diagnosis not present

## 2017-06-21 DIAGNOSIS — I69391 Dysphagia following cerebral infarction: Secondary | ICD-10-CM | POA: Diagnosis not present

## 2017-06-21 DIAGNOSIS — I69319 Unspecified symptoms and signs involving cognitive functions following cerebral infarction: Secondary | ICD-10-CM | POA: Diagnosis not present

## 2017-06-21 DIAGNOSIS — I129 Hypertensive chronic kidney disease with stage 1 through stage 4 chronic kidney disease, or unspecified chronic kidney disease: Secondary | ICD-10-CM | POA: Diagnosis not present

## 2017-06-21 DIAGNOSIS — I69322 Dysarthria following cerebral infarction: Secondary | ICD-10-CM | POA: Diagnosis not present

## 2017-06-21 DIAGNOSIS — I69354 Hemiplegia and hemiparesis following cerebral infarction affecting left non-dominant side: Secondary | ICD-10-CM | POA: Diagnosis not present

## 2017-06-21 DIAGNOSIS — N189 Chronic kidney disease, unspecified: Secondary | ICD-10-CM | POA: Diagnosis not present

## 2017-06-22 ENCOUNTER — Telehealth: Payer: Self-pay

## 2017-06-22 NOTE — Telephone Encounter (Signed)
elam office has recd forms from [facility] assisted living at [facility]----faxed by Olin Hauser at [facility]---i'm not sure what information they are needing, fax is not clear---I have left message for pamela at [facility] to call our office back to explain---pamela works 3rd shift, so they are leaving message for her to talk to someone on first shift and first shift will call our office---can talk with Marcie Bal at [facility] office

## 2017-06-25 DIAGNOSIS — I69319 Unspecified symptoms and signs involving cognitive functions following cerebral infarction: Secondary | ICD-10-CM | POA: Diagnosis not present

## 2017-06-25 DIAGNOSIS — N189 Chronic kidney disease, unspecified: Secondary | ICD-10-CM | POA: Diagnosis not present

## 2017-06-25 DIAGNOSIS — I69354 Hemiplegia and hemiparesis following cerebral infarction affecting left non-dominant side: Secondary | ICD-10-CM | POA: Diagnosis not present

## 2017-06-25 DIAGNOSIS — I69391 Dysphagia following cerebral infarction: Secondary | ICD-10-CM | POA: Diagnosis not present

## 2017-06-25 DIAGNOSIS — I129 Hypertensive chronic kidney disease with stage 1 through stage 4 chronic kidney disease, or unspecified chronic kidney disease: Secondary | ICD-10-CM | POA: Diagnosis not present

## 2017-06-25 DIAGNOSIS — I69322 Dysarthria following cerebral infarction: Secondary | ICD-10-CM | POA: Diagnosis not present

## 2017-06-26 DIAGNOSIS — I69319 Unspecified symptoms and signs involving cognitive functions following cerebral infarction: Secondary | ICD-10-CM | POA: Diagnosis not present

## 2017-06-26 DIAGNOSIS — I69322 Dysarthria following cerebral infarction: Secondary | ICD-10-CM | POA: Diagnosis not present

## 2017-06-26 DIAGNOSIS — I69391 Dysphagia following cerebral infarction: Secondary | ICD-10-CM | POA: Diagnosis not present

## 2017-06-26 DIAGNOSIS — N189 Chronic kidney disease, unspecified: Secondary | ICD-10-CM | POA: Diagnosis not present

## 2017-06-26 DIAGNOSIS — I69354 Hemiplegia and hemiparesis following cerebral infarction affecting left non-dominant side: Secondary | ICD-10-CM | POA: Diagnosis not present

## 2017-06-26 DIAGNOSIS — I129 Hypertensive chronic kidney disease with stage 1 through stage 4 chronic kidney disease, or unspecified chronic kidney disease: Secondary | ICD-10-CM | POA: Diagnosis not present

## 2017-06-28 DIAGNOSIS — I69319 Unspecified symptoms and signs involving cognitive functions following cerebral infarction: Secondary | ICD-10-CM | POA: Diagnosis not present

## 2017-06-28 DIAGNOSIS — I69354 Hemiplegia and hemiparesis following cerebral infarction affecting left non-dominant side: Secondary | ICD-10-CM | POA: Diagnosis not present

## 2017-06-28 DIAGNOSIS — I69322 Dysarthria following cerebral infarction: Secondary | ICD-10-CM | POA: Diagnosis not present

## 2017-06-28 DIAGNOSIS — N189 Chronic kidney disease, unspecified: Secondary | ICD-10-CM | POA: Diagnosis not present

## 2017-06-28 DIAGNOSIS — I129 Hypertensive chronic kidney disease with stage 1 through stage 4 chronic kidney disease, or unspecified chronic kidney disease: Secondary | ICD-10-CM | POA: Diagnosis not present

## 2017-06-28 DIAGNOSIS — I69391 Dysphagia following cerebral infarction: Secondary | ICD-10-CM | POA: Diagnosis not present

## 2017-06-29 DIAGNOSIS — I69391 Dysphagia following cerebral infarction: Secondary | ICD-10-CM | POA: Diagnosis not present

## 2017-06-29 DIAGNOSIS — I129 Hypertensive chronic kidney disease with stage 1 through stage 4 chronic kidney disease, or unspecified chronic kidney disease: Secondary | ICD-10-CM | POA: Diagnosis not present

## 2017-06-29 DIAGNOSIS — I69319 Unspecified symptoms and signs involving cognitive functions following cerebral infarction: Secondary | ICD-10-CM | POA: Diagnosis not present

## 2017-06-29 DIAGNOSIS — I69322 Dysarthria following cerebral infarction: Secondary | ICD-10-CM | POA: Diagnosis not present

## 2017-06-29 DIAGNOSIS — I69354 Hemiplegia and hemiparesis following cerebral infarction affecting left non-dominant side: Secondary | ICD-10-CM | POA: Diagnosis not present

## 2017-06-29 DIAGNOSIS — N189 Chronic kidney disease, unspecified: Secondary | ICD-10-CM | POA: Diagnosis not present

## 2017-07-02 DIAGNOSIS — I69354 Hemiplegia and hemiparesis following cerebral infarction affecting left non-dominant side: Secondary | ICD-10-CM | POA: Diagnosis not present

## 2017-07-02 DIAGNOSIS — I69319 Unspecified symptoms and signs involving cognitive functions following cerebral infarction: Secondary | ICD-10-CM | POA: Diagnosis not present

## 2017-07-02 DIAGNOSIS — I69322 Dysarthria following cerebral infarction: Secondary | ICD-10-CM | POA: Diagnosis not present

## 2017-07-02 DIAGNOSIS — N189 Chronic kidney disease, unspecified: Secondary | ICD-10-CM | POA: Diagnosis not present

## 2017-07-02 DIAGNOSIS — I129 Hypertensive chronic kidney disease with stage 1 through stage 4 chronic kidney disease, or unspecified chronic kidney disease: Secondary | ICD-10-CM | POA: Diagnosis not present

## 2017-07-02 DIAGNOSIS — I69391 Dysphagia following cerebral infarction: Secondary | ICD-10-CM | POA: Diagnosis not present

## 2017-07-03 DIAGNOSIS — I69391 Dysphagia following cerebral infarction: Secondary | ICD-10-CM | POA: Diagnosis not present

## 2017-07-03 DIAGNOSIS — I69319 Unspecified symptoms and signs involving cognitive functions following cerebral infarction: Secondary | ICD-10-CM | POA: Diagnosis not present

## 2017-07-03 DIAGNOSIS — N189 Chronic kidney disease, unspecified: Secondary | ICD-10-CM | POA: Diagnosis not present

## 2017-07-03 DIAGNOSIS — I69354 Hemiplegia and hemiparesis following cerebral infarction affecting left non-dominant side: Secondary | ICD-10-CM | POA: Diagnosis not present

## 2017-07-03 DIAGNOSIS — I129 Hypertensive chronic kidney disease with stage 1 through stage 4 chronic kidney disease, or unspecified chronic kidney disease: Secondary | ICD-10-CM | POA: Diagnosis not present

## 2017-07-03 DIAGNOSIS — I69322 Dysarthria following cerebral infarction: Secondary | ICD-10-CM | POA: Diagnosis not present

## 2017-07-04 DIAGNOSIS — I69322 Dysarthria following cerebral infarction: Secondary | ICD-10-CM | POA: Diagnosis not present

## 2017-07-04 DIAGNOSIS — I129 Hypertensive chronic kidney disease with stage 1 through stage 4 chronic kidney disease, or unspecified chronic kidney disease: Secondary | ICD-10-CM | POA: Diagnosis not present

## 2017-07-04 DIAGNOSIS — N189 Chronic kidney disease, unspecified: Secondary | ICD-10-CM | POA: Diagnosis not present

## 2017-07-04 DIAGNOSIS — I69354 Hemiplegia and hemiparesis following cerebral infarction affecting left non-dominant side: Secondary | ICD-10-CM | POA: Diagnosis not present

## 2017-07-04 DIAGNOSIS — I69319 Unspecified symptoms and signs involving cognitive functions following cerebral infarction: Secondary | ICD-10-CM | POA: Diagnosis not present

## 2017-07-04 DIAGNOSIS — I69391 Dysphagia following cerebral infarction: Secondary | ICD-10-CM | POA: Diagnosis not present

## 2017-07-05 DIAGNOSIS — I129 Hypertensive chronic kidney disease with stage 1 through stage 4 chronic kidney disease, or unspecified chronic kidney disease: Secondary | ICD-10-CM | POA: Diagnosis not present

## 2017-07-05 DIAGNOSIS — I69354 Hemiplegia and hemiparesis following cerebral infarction affecting left non-dominant side: Secondary | ICD-10-CM | POA: Diagnosis not present

## 2017-07-05 DIAGNOSIS — I69322 Dysarthria following cerebral infarction: Secondary | ICD-10-CM | POA: Diagnosis not present

## 2017-07-05 DIAGNOSIS — I69319 Unspecified symptoms and signs involving cognitive functions following cerebral infarction: Secondary | ICD-10-CM | POA: Diagnosis not present

## 2017-07-05 DIAGNOSIS — N189 Chronic kidney disease, unspecified: Secondary | ICD-10-CM | POA: Diagnosis not present

## 2017-07-05 DIAGNOSIS — I69391 Dysphagia following cerebral infarction: Secondary | ICD-10-CM | POA: Diagnosis not present

## 2017-07-06 ENCOUNTER — Encounter: Payer: Self-pay | Admitting: Physical Medicine & Rehabilitation

## 2017-07-06 ENCOUNTER — Ambulatory Visit (HOSPITAL_BASED_OUTPATIENT_CLINIC_OR_DEPARTMENT_OTHER): Payer: Medicare Other | Admitting: Physical Medicine & Rehabilitation

## 2017-07-06 ENCOUNTER — Encounter: Payer: Medicare Other | Attending: Physical Medicine & Rehabilitation

## 2017-07-06 VITALS — BP 118/67 | HR 88

## 2017-07-06 DIAGNOSIS — Z9181 History of falling: Secondary | ICD-10-CM | POA: Insufficient documentation

## 2017-07-06 DIAGNOSIS — I69313 Psychomotor deficit following cerebral infarction: Secondary | ICD-10-CM | POA: Diagnosis not present

## 2017-07-06 DIAGNOSIS — M1990 Unspecified osteoarthritis, unspecified site: Secondary | ICD-10-CM | POA: Diagnosis not present

## 2017-07-06 DIAGNOSIS — Z809 Family history of malignant neoplasm, unspecified: Secondary | ICD-10-CM | POA: Diagnosis not present

## 2017-07-06 DIAGNOSIS — R1310 Dysphagia, unspecified: Secondary | ICD-10-CM | POA: Insufficient documentation

## 2017-07-06 DIAGNOSIS — R262 Difficulty in walking, not elsewhere classified: Secondary | ICD-10-CM | POA: Diagnosis not present

## 2017-07-06 DIAGNOSIS — I69391 Dysphagia following cerebral infarction: Secondary | ICD-10-CM | POA: Insufficient documentation

## 2017-07-06 DIAGNOSIS — I69398 Other sequelae of cerebral infarction: Secondary | ICD-10-CM | POA: Diagnosis not present

## 2017-07-06 DIAGNOSIS — N189 Chronic kidney disease, unspecified: Secondary | ICD-10-CM | POA: Insufficient documentation

## 2017-07-06 DIAGNOSIS — Z8261 Family history of arthritis: Secondary | ICD-10-CM | POA: Insufficient documentation

## 2017-07-06 DIAGNOSIS — Z9889 Other specified postprocedural states: Secondary | ICD-10-CM | POA: Diagnosis not present

## 2017-07-06 DIAGNOSIS — M25532 Pain in left wrist: Secondary | ICD-10-CM | POA: Insufficient documentation

## 2017-07-06 DIAGNOSIS — Z96643 Presence of artificial hip joint, bilateral: Secondary | ICD-10-CM | POA: Insufficient documentation

## 2017-07-06 DIAGNOSIS — M810 Age-related osteoporosis without current pathological fracture: Secondary | ICD-10-CM | POA: Insufficient documentation

## 2017-07-06 DIAGNOSIS — J309 Allergic rhinitis, unspecified: Secondary | ICD-10-CM | POA: Insufficient documentation

## 2017-07-06 DIAGNOSIS — R269 Unspecified abnormalities of gait and mobility: Secondary | ICD-10-CM

## 2017-07-06 DIAGNOSIS — I6350 Cerebral infarction due to unspecified occlusion or stenosis of unspecified cerebral artery: Secondary | ICD-10-CM

## 2017-07-06 DIAGNOSIS — Z8249 Family history of ischemic heart disease and other diseases of the circulatory system: Secondary | ICD-10-CM | POA: Diagnosis not present

## 2017-07-06 DIAGNOSIS — Z789 Other specified health status: Secondary | ICD-10-CM

## 2017-07-06 DIAGNOSIS — I129 Hypertensive chronic kidney disease with stage 1 through stage 4 chronic kidney disease, or unspecified chronic kidney disease: Secondary | ICD-10-CM | POA: Insufficient documentation

## 2017-07-06 NOTE — Progress Notes (Signed)
Subjective:    Patient ID: Theresa Hampton, female    DOB: [birth year], 84 y.o.   MRN: [id number] 84 y.o.left handed femalewith history of HTN, CKD, OA, B-12 deficiency, one week history of difficulty walking progressing to increased falls.She was admitted on 12/30/18AMafter found by family with left facial droop. MRI brain revealed right pontine stroke with moderate stenosis at origin of R-VA and moderate stenosis L-SCA. Neurology recommended changing ASA to Plavix for stroke likely due to SVD. She had reports of buttock pain as well as left wrist pain at admission and  X rays of pelvis showed evidence of prior sacral fracture and left wrist films showed moderate degenerative changes with soft tissue swelling distal wrist. Patient with resultant deficits in functional mobility, dysphagia as well as difficulty completing ADLs. HPI Seeing Home health at assisted living finished up PT, OT  Switching to outpt therapy at [facility].  Uses walker to ambulate to and from dining room twice a day Sits down about halfway to rest  Bathing with supervision Needs assist to don slacks  No falls since last visit, has seen PCP with another appt in Mar 22,2019  Pain Inventory Average Pain 0 Pain Right Now 0 My pain is na  In the last 24 hours, has pain interfered with the following? General activity 0 Relation with others 0 Enjoyment of life 0 What TIME of day is your pain at its worst? na Sleep (in general) Good  Pain is worse with: na Pain improves with: na Relief from Meds: na  Mobility walk with assistance use a walker use a wheelchair transfers alone  Function retired  Neuro/Psych numbness trouble walking  Prior Studies Any changes since last visit?  no  Physicians involved in your care Any changes since last visit?  no   Family History  Problem Relation Age of Onset  . Arthritis Mother   . Hypertension Unknown   . Cancer Sister    Social History    Socioeconomic History  . Marital status: Widowed    Spouse name: Not on file  . Number of children: Not on file  . Years of education: Not on file  . Highest education level: Not on file  Social Needs  . Financial resource strain: Not on file  . Food insecurity - worry: Not on file  . Food insecurity - inability: Not on file  . Transportation needs - medical: Not on file  . Transportation needs - non-medical: Not on file  Occupational History  . Occupation: retired  Tobacco Use  . Smoking status: Never Smoker  . Smokeless tobacco: Never Used  Substance and Sexual Activity  . Alcohol use: No  . Drug use: No  . Sexual activity: Not Currently  Other Topics Concern  . Not on file  Social History Narrative  . Not on file   Past Surgical History:  Procedure Laterality Date  . CATARACT EXTRACTION, BILATERAL Bilateral   . DILATION AND CURETTAGE OF UTERUS    . ENDOMETRIAL ABLATION  2008  . JOINT REPLACEMENT    . THYROIDECTOMY, PARTIAL  "many years ago"  . TOTAL HIP ARTHROPLASTY Left 01/06/2015   Procedure: LEFT TOTAL HIP ARTHROPLASTY ANTERIOR APPROACH;  Surgeon: Gaynelle Arabian, MD;  Location: [facility];  Service: Orthopedics;  Laterality: Left;  . TOTAL HIP ARTHROPLASTY Right 2004   Past Medical History:  Diagnosis Date  . Allergic rhinitis   . Arthritis    "hips" (05/08/2017)  . B12 deficiency anemia   . Chronic  sinusitis   . Daily headache    "q am" (05/08/2017)  . History of transfusion 2004   "w/1st hip OR" (05/08/2017)  . Hyperlipidemia   . Hypertension   . Iron deficiency anemia   . Leg cramps   . Osteoarthritis    Dr. Maureen Ralphs  . Osteoporosis   . Peptic ulcer disease 2003  . PONV (postoperative nausea and vomiting)   . Pyelonephritis, acute 2014  . Seasonal allergies   . Seizures (HCC)    x1 with Lidocaine  . Stroke ([place]) 05/05/2017  . Vitamin B12 deficiency   . Vitamin D deficiency    LMP  (LMP Unknown)   Opioid Risk Score:   Fall Risk Score:   `1  Depression screen PHQ 2/9  Depression screen [facility] 2/9 05/23/2016 10/14/2014  Decreased Interest 0 1  Down, Depressed, Hopeless 0 0  PHQ - 2 Score 0 1     Review of Systems  HENT: Negative.   Eyes: Negative.   Respiratory: Negative.   Cardiovascular: Negative.   Gastrointestinal: Negative.   Endocrine: Negative.   Genitourinary: Negative.   Musculoskeletal: Positive for gait problem.  Skin: Negative.   Allergic/Immunologic: Negative.   Neurological: Positive for weakness.  Hematological: Negative.   Psychiatric/Behavioral: Negative.   All other systems reviewed and are negative.      Objective:   Physical Exam  Constitutional: She is oriented to person, place, and time. She appears well-developed and well-nourished. No distress.  HENT:  Head: Normocephalic and atraumatic.  Eyes: Conjunctivae and EOM are normal. Pupils are equal, round, and reactive to light.  Neck: Normal range of motion.  Neurological: She is alert and oriented to person, place, and time.  Mild dysmetria left finger-nose-finger No evidence of dysmetria right finger-nose-finger Motor strength is 5/5 bilateral deltoid, bicep, tricep, grip, hip flexor, knee extensor, ankle dorsiflexor Gait is using hand-held assistance no evidence of toe drag or knee instability she has a widened base support. She is able to stand with her feet touching together but tends to feel unsteady did not attempt Romberg.  Skin: Skin is warm and dry. She is not diaphoretic.  Psychiatric: She has a normal mood and affect.  Nursing note and vitals reviewed.         Assessment & Plan:  1.  Right pontine infarct with residual gait disorder and left upper extremity dysmetria.  Overall she has had a very good recovery from her stroke.  I agree with outpatient PT OT to continue working on balance as well as left upper extremity coordination. I will see the patient back in approximately 6 weeks to monitor her progress. She will  follow-up with her primary care physician as well.

## 2017-07-10 DIAGNOSIS — R278 Other lack of coordination: Secondary | ICD-10-CM | POA: Diagnosis not present

## 2017-07-10 DIAGNOSIS — M6281 Muscle weakness (generalized): Secondary | ICD-10-CM | POA: Diagnosis not present

## 2017-07-10 DIAGNOSIS — R2689 Other abnormalities of gait and mobility: Secondary | ICD-10-CM | POA: Diagnosis not present

## 2017-07-10 DIAGNOSIS — Z9181 History of falling: Secondary | ICD-10-CM | POA: Diagnosis not present

## 2017-07-10 DIAGNOSIS — R2681 Unsteadiness on feet: Secondary | ICD-10-CM | POA: Diagnosis not present

## 2017-07-11 DIAGNOSIS — M6281 Muscle weakness (generalized): Secondary | ICD-10-CM | POA: Diagnosis not present

## 2017-07-11 DIAGNOSIS — R278 Other lack of coordination: Secondary | ICD-10-CM | POA: Diagnosis not present

## 2017-07-11 DIAGNOSIS — R2689 Other abnormalities of gait and mobility: Secondary | ICD-10-CM | POA: Diagnosis not present

## 2017-07-11 DIAGNOSIS — R2681 Unsteadiness on feet: Secondary | ICD-10-CM | POA: Diagnosis not present

## 2017-07-11 DIAGNOSIS — Z9181 History of falling: Secondary | ICD-10-CM | POA: Diagnosis not present

## 2017-07-12 ENCOUNTER — Encounter: Payer: Self-pay | Admitting: Adult Health

## 2017-07-12 ENCOUNTER — Ambulatory Visit (INDEPENDENT_AMBULATORY_CARE_PROVIDER_SITE_OTHER): Payer: Medicare Other | Admitting: Adult Health

## 2017-07-12 VITALS — BP 120/63 | HR 79 | Ht 65.0 in | Wt 124.4 lb

## 2017-07-12 DIAGNOSIS — I6350 Cerebral infarction due to unspecified occlusion or stenosis of unspecified cerebral artery: Secondary | ICD-10-CM | POA: Diagnosis not present

## 2017-07-12 DIAGNOSIS — E785 Hyperlipidemia, unspecified: Secondary | ICD-10-CM

## 2017-07-12 DIAGNOSIS — Z9181 History of falling: Secondary | ICD-10-CM | POA: Diagnosis not present

## 2017-07-12 DIAGNOSIS — R2681 Unsteadiness on feet: Secondary | ICD-10-CM | POA: Diagnosis not present

## 2017-07-12 DIAGNOSIS — I10 Essential (primary) hypertension: Secondary | ICD-10-CM

## 2017-07-12 DIAGNOSIS — M6281 Muscle weakness (generalized): Secondary | ICD-10-CM | POA: Diagnosis not present

## 2017-07-12 DIAGNOSIS — R278 Other lack of coordination: Secondary | ICD-10-CM | POA: Diagnosis not present

## 2017-07-12 DIAGNOSIS — R2689 Other abnormalities of gait and mobility: Secondary | ICD-10-CM | POA: Diagnosis not present

## 2017-07-12 NOTE — Patient Instructions (Signed)
Continue clopidogrel 75 mg daily  and lipitor 40mg   for secondary stroke prevention  Continue to work with therapies  Follow up with PCP regarding urine frequency and urgency as they can test your urine and possibly treat if needed  PCP to manage blood pressure and cholesterol  Maintain strict control of hypertension with blood pressure goal below 130/90, diabetes with hemoglobin A1c goal below 6.5% and cholesterol with LDL cholesterol (bad cholesterol) goal below 70 mg/dL. I also advised the patient to eat a healthy diet with plenty of whole grains, cereals, fruits and vegetables, exercise regularly and maintain ideal body weight.  Followup in the future with me in 4 months or call earlier if needed

## 2017-07-12 NOTE — Progress Notes (Signed)
[facility] [address] [place]. [place] [address] [phone]       OFFICE FOLLOW UP NOTE  [name] Date of Birth:  Sep 08, 1919 Medical Record Number:  [id number]   Reason for Referral:  Hospital stroke follow up  CHIEF COMPLAINT:  Chief Complaint  Patient presents with  . Follow-up    Hospital Stroke follow up pt saw Dr. Erlinda Hong in the hospital  Pt is at [facility]    HPI: Theresa Hampton is being seen today in the office for acute/subacute nonhemorrhagic linear infarct right paramedian pons on 05/06/17. History obtained from patient and chart review. Reviewed all radiology images and labs personally.  [name] is a 84 y.o. female with PMH of hypertension hyperlipidemia peptic ulcer disease vitamin D and B12 deficiency with 2 days worth of left facial droop and falls. On examination she had left facial droop, mild dysarthria and mild left hemiparesis.  CT head reviewed and negative for acute intracranial abnormality.  MRI reviewed and showed acute/subacute nonhemorrhagic linear infarct involving the right paramedian pons which does correspond with the patient's left-sided weakness and likely etiology small vessel disease.  MRA of the head and neck is negative for showed mild stenosis in the right PCA, origin of the carotid artery bilaterally and moderate stenosis in the left superior cerebral artery and the origin of the right vertebral artery.  The basilar artery was widely patent.  2D echo showed an EF of 55-60%.  Patient was taking aspirin 81 mg PTA but was switched to Plavix 75 mg daily at discharge.  LDL 133 and a started on Lipitor 40 mg daily. A1c 5.3.  Patient was accepted to CIR for continued therapies.  Patient was discharged to [facility] (assisted living facility) in stable condition.  Since discharge, patient has been doing well.  She is accompanied today by her daughter.  Patient continues to take Plavix without side effects  of increased bleeding or bruising.  Patient continues to take Lipitor without side effects of myalgias.  Patient continues to work with PT OT [facility], assisted living facility.  Patient's blood pressure has been satisfactory and at today's visit 120/63.  Patient feels as though most of her symptoms have improved.  Denies memory complaints.  Patient does have complaints of urination and urgency.  Denies pain, hematuria, or fevers.  This has been occurring for proximally 1 week.  Patient was advised to follow-up with PCP regarding this issue who can obtain cultures and treat if necessary.  Denies new or worsening stroke/TIA symptoms.     ROS:   14 system review of systems performed and negative with exception of urination  urgency, swelling in legs, and allergies  PMH:  Past Medical History:  Diagnosis Date  . Allergic rhinitis   . Arthritis    "hips" (05/08/2017)  . B12 deficiency anemia   . Chronic sinusitis   . Daily headache    "q am" (05/08/2017)  . History of transfusion 2004   "w/1st hip OR" (05/08/2017)  . Hyperlipidemia   . Hypertension   . Iron deficiency anemia   . Leg cramps   . Osteoarthritis    Dr. Maureen Ralphs  . Osteoporosis   . Peptic ulcer disease 2003  . PONV (postoperative nausea and vomiting)   . Pyelonephritis, acute 2014  . Seasonal allergies   . Seizures (HCC)    x1 with Lidocaine  . Stroke ([place]) 05/05/2017  . Vitamin B12 deficiency   .  Vitamin D deficiency     PSH:  Past Surgical History:  Procedure Laterality Date  . CATARACT EXTRACTION, BILATERAL Bilateral   . DILATION AND CURETTAGE OF UTERUS    . ENDOMETRIAL ABLATION  2008  . JOINT REPLACEMENT    . THYROIDECTOMY, PARTIAL  "many years ago"  . TOTAL HIP ARTHROPLASTY Left 01/06/2015   Procedure: LEFT TOTAL HIP ARTHROPLASTY ANTERIOR APPROACH;  Surgeon: Gaynelle Arabian, MD;  Location: [facility];  Service: Orthopedics;  Laterality: Left;  . TOTAL HIP ARTHROPLASTY Right 2004    Social History:  Social History    Socioeconomic History  . Marital status: Widowed    Spouse name: Not on file  . Number of children: Not on file  . Years of education: Not on file  . Highest education level: Not on file  Social Needs  . Financial resource strain: Not on file  . Food insecurity - worry: Not on file  . Food insecurity - inability: Not on file  . Transportation needs - medical: Not on file  . Transportation needs - non-medical: Not on file  Occupational History  . Occupation: retired  Tobacco Use  . Smoking status: Never Smoker  . Smokeless tobacco: Never Used  Substance and Sexual Activity  . Alcohol use: No  . Drug use: No  . Sexual activity: Not Currently  Other Topics Concern  . Not on file  Social History Narrative  . Not on file    Family History:  Family History  Problem Relation Age of Onset  . Arthritis Mother   . Hypertension Unknown   . Cancer Sister     Medications:   Current Outpatient Medications on File Prior to Visit  Medication Sig Dispense Refill  . acetaminophen (TYLENOL) 325 MG tablet Take 2 tablets (650 mg total) by mouth every 6 (six) hours as needed.    Marland Kitchen amLODipine (NORVASC) 2.5 MG tablet Take 1 tablet (2.5 mg total) by mouth daily. 30 tablet 1  . atorvastatin (LIPITOR) 40 MG tablet Take 1 tablet (40 mg total) by mouth daily at 6 PM. 30 tablet 0  . cholecalciferol (VITAMIN D) 400 units TABS tablet Take 400 Units by mouth.    . CLONAZEPAM PO Take 0.25 mg by mouth as needed.    . clopidogrel (PLAVIX) 75 MG tablet Take 1 tablet (75 mg total) by mouth daily. 30 tablet 0  . diclofenac sodium (VOLTAREN) 1 % GEL Apply 2 g topically 4 (four) times daily. 4 Tube 0  . fluticasone (FLONASE) 50 MCG/ACT nasal spray Place into both nostrils daily.    Marland Kitchen gabapentin (NEURONTIN) 100 MG capsule TAKE 1 CAPSULE BY MOUTH AT BEDTIME 30 capsule 1  . loratadine (CLARITIN) 10 MG tablet Take 1 tablet (10 mg total) by mouth daily. 100 tablet 2  . LUMIGAN 0.01 % SOLN instill 1 drop IN  [facility] AT BEDTIME 7.5 mL 0  . Menthol-Methyl Salicylate (MUSCLE RUB) 10-15 % CREA Apply 1 application topically 2 (two) times daily as needed for muscle pain (BACK PAIN).  0  . mirtazapine (REMERON) 15 MG tablet TAKE ONE TABLET BY MOUTH EVERY EVENING BEFORE DINNER AT 4 - 5 PM 30 tablet 1  . Multiple Vitamins-Iron (MULTIVITAMINS WITH IRON) TABS tablet Take 1 tablet by mouth daily.  0  . senna-docusate (SENOKOT-S) 8.6-50 MG tablet Take 1 tablet by mouth at bedtime as needed for moderate constipation. 30 tablet 0  . traMADol (ULTRAM) 50 MG tablet Take 0.5 tablets (25 mg total) by mouth  every 12 (twelve) hours as needed for severe pain. 10 tablet 0  . traZODone (DESYREL) 50 MG tablet Take 0.5 tablets (25 mg total) by mouth at bedtime as needed for sleep. 15 tablet 0  . vitamin B-12 (CYANOCOBALAMIN) 100 MCG tablet Take 100 mcg by mouth daily.     No current facility-administered medications on file prior to visit.     Allergies:   Allergies  Allergen Reactions  . Lidocaine Hcl     seizure     Physical Exam  Vitals:   07/12/17 1430  BP: 120/63  Pulse: 79  Weight: 124 lb 6.4 oz (56.4 kg)  Height: 5\' 5"  (1.651 m)   Body mass index is 20.7 kg/m. No exam data present  General: well developed, frail elderly Caucasian female, well nourished, seated, in no evident distress Head: head normocephalic and atraumatic.   Neck: supple with no carotid or supraclavicular bruits Cardiovascular: regular rate and rhythm, no murmurs Musculoskeletal: no deformity Skin:  no rash/petichiae Vascular:  Normal pulses all extremities  Neurologic Exam Mental Status: Awake and fully alert.  Mild dysarthria.  Oriented to place and time. Remote memory intact.  Recall 1/3.  AFT 5.  Clock drawing 3/4.  Attention span, concentration and fund of knowledge appropriate. Mood and affect appropriate.  Cranial Nerves: Fundoscopic exam reveals sharp disc margins. Pupils equal, briskly reactive to light. Extraocular  movements full without nystagmus. Visual fields full to confrontation. Hearing intact. Facial sensation intact.  Mild left facial asymmetry. Motor: Normal bulk and tone.  Decreased left hand grasp, 4/5 left bicep.  Left leg 4/5 hip flexor.  All other extremities tested 5/5 strength. Sensory.: intact to touch , pinprick , position and vibratory sensation.  Coordination: Left hand decreased dexterity.  Right arm orbits around left arm. Gait and Station: Patient currently sitting in wheelchair.  Ambulates with a rolling walker which is not present at today's appointment.  Patient able to stand with assistance. Reflexes: 1+ and symmetric. Toes downgoing.    NIHSS  2 (minor facial palsy - 1; mild to moderate dysarthria - 1) Modified Rankin 3-4    Diagnostic Data (Labs, Imaging, Testing)  Dg Sacrum/coccyx Result Date: 05/06/2017 IMPRESSION: Distal sacral fracture of uncertain age.   Mr Brain Wo Contrast Result Date: 05/06/2017 IMPRESSION: 1. Acute/subacute nonhemorrhagic linear infarct involving the right paramedian pons, corresponding with the patient's left-sided weakness. 2. No acute supratentorial infarct. 3. Advanced atrophy and diffuse white matter disease likely reflects the sequela of chronic microvascular ischemia. 4. Moderate paranasal sinus disease, predominantly involving the ethmoids.   Mr [facility] and [facility]ck Wo Contrast Result Date: 05/06/2017 IMPRESSION: Mild stenosis at the origin of the carotid artery bilaterally Moderate stenosis at the origin of the right vertebral artery. Origin of left vertebral not included on the study. Moderate stenosis left superior cerebellar artery. Mild stenosis right posterior cerebral artery. Basilar artery widely patent.   Ct Head, Maxillofacial and Cervical Wo Contrast Result Date: 05/06/2017 IMPRESSION:  Head CT: CT negative for acute intracranial abnormality. Evidence of chronic microvascular ischemic disease.  Maxillofacial CT: No acute  facial fracture. Soft tissue swelling in the left supraorbital scalp without underlying fracture. No radiopaque foreign body. Moderate paranasal sinus disease predominantly involving the ethmoid air cells.  Cervical CT: CT negative for acute fracture malalignment of the cervical spine. Greatest degree of disc disease present at C5-C6 with associated facet disease resulting in mild a moderate left-sided foraminal narrowing. Trace anterolisthesis of C3 on C4 and C4 on C5,  likely degenerative. Carotid calcifications.   Echocardiogram:                                               Study Conclusions - Left ventricle: The cavity size was normal. There was mild concentric hypertrophy. Systolic function was normal. The estimated ejection fraction was in the range of 55% to 60%. Wall motion was normal; there were no regional wall motion abnormalities. Doppler parameters are consistent with abnormal left ventricular relaxation (grade 1 diastolic dysfunction). There was no evidence of elevated ventricular filling pressure by Doppler parameters. - Aortic valve: Trileaflet; moderately thickened, severely calcified leaflets. There was mild stenosis. Peak gradient (S): 12 mm Hg. Valve area (Vmax): 1.16 cm^2. - Aortic root: The aortic root was normal in size. - Mitral valve: There was mild regurgitation. - Right ventricle: The cavity size was normal. Wall thickness was normal. Systolic function was normal. - Tricuspid valve: There was moderate regurgitation. - Pulmonic valve: There was trivial regurgitation. - Pulmonary arteries: Systolic pressure was moderately increased. PA peak pressure: 43 mm Hg (S). - Inferior vena cava: The vessel was normal in size. - Pericardium, extracardiac: There was no pericardial effusion.     ASSESSMENT: ALANNIE AMODIO is a 84 y.o. year old female here with acute/subacute infarct in the right paramedian pons on 05/06/2017 secondary to small vessel  disease.  Vascular risk factors include hypertension and hyperlipidemia.    PLAN: Continue clopidogrel 75 mg daily  and lipitor 40mg   for secondary stroke prevention  Continue PT/OT   Follow up with PCP regarding urine urgency for possible UTI and treatment  PCP to manage blood pressure and cholesterol  Maintain strict control of hypertension with blood pressure goal below 130/90, diabetes with hemoglobin A1c goal below 6.5% and cholesterol with LDL cholesterol (bad cholesterol) goal below 70 mg/dL. I also advised the patient to eat a healthy diet with plenty of whole grains, cereals, fruits and vegetables, exercise regularly and maintain ideal body weight.  Followup in the future with me in 4 months or call earlier if needed   Greater than 50% of time during this 25  minute visit was spent on counseling,explanation of diagnosis, planning of further management, discussion with patient and family and coordination of care.   No orders of the defined types were placed in this encounter.    Venancio Poisson, AGNP-BC  [facility] [address] [place], [place] [address]  Phone [phone] Fax [phone]

## 2017-07-13 DIAGNOSIS — R2681 Unsteadiness on feet: Secondary | ICD-10-CM | POA: Diagnosis not present

## 2017-07-13 DIAGNOSIS — M6281 Muscle weakness (generalized): Secondary | ICD-10-CM | POA: Diagnosis not present

## 2017-07-13 DIAGNOSIS — R278 Other lack of coordination: Secondary | ICD-10-CM | POA: Diagnosis not present

## 2017-07-13 DIAGNOSIS — Z9181 History of falling: Secondary | ICD-10-CM | POA: Diagnosis not present

## 2017-07-13 DIAGNOSIS — R2689 Other abnormalities of gait and mobility: Secondary | ICD-10-CM | POA: Diagnosis not present

## 2017-07-13 NOTE — Progress Notes (Signed)
I reviewed above note and agree with the assessment and plan.  Rosalin Hawking, MD PhD Stroke Neurology 07/13/2017 5:48 PM

## 2017-07-17 DIAGNOSIS — R2681 Unsteadiness on feet: Secondary | ICD-10-CM | POA: Diagnosis not present

## 2017-07-17 DIAGNOSIS — R278 Other lack of coordination: Secondary | ICD-10-CM | POA: Diagnosis not present

## 2017-07-17 DIAGNOSIS — M6281 Muscle weakness (generalized): Secondary | ICD-10-CM | POA: Diagnosis not present

## 2017-07-17 DIAGNOSIS — R2689 Other abnormalities of gait and mobility: Secondary | ICD-10-CM | POA: Diagnosis not present

## 2017-07-17 DIAGNOSIS — Z9181 History of falling: Secondary | ICD-10-CM | POA: Diagnosis not present

## 2017-07-18 DIAGNOSIS — M6281 Muscle weakness (generalized): Secondary | ICD-10-CM | POA: Diagnosis not present

## 2017-07-18 DIAGNOSIS — R2681 Unsteadiness on feet: Secondary | ICD-10-CM | POA: Diagnosis not present

## 2017-07-18 DIAGNOSIS — R2689 Other abnormalities of gait and mobility: Secondary | ICD-10-CM | POA: Diagnosis not present

## 2017-07-18 DIAGNOSIS — Z9181 History of falling: Secondary | ICD-10-CM | POA: Diagnosis not present

## 2017-07-18 DIAGNOSIS — R278 Other lack of coordination: Secondary | ICD-10-CM | POA: Diagnosis not present

## 2017-07-19 DIAGNOSIS — M6281 Muscle weakness (generalized): Secondary | ICD-10-CM | POA: Diagnosis not present

## 2017-07-19 DIAGNOSIS — Z9181 History of falling: Secondary | ICD-10-CM | POA: Diagnosis not present

## 2017-07-19 DIAGNOSIS — R2689 Other abnormalities of gait and mobility: Secondary | ICD-10-CM | POA: Diagnosis not present

## 2017-07-19 DIAGNOSIS — R2681 Unsteadiness on feet: Secondary | ICD-10-CM | POA: Diagnosis not present

## 2017-07-19 DIAGNOSIS — R278 Other lack of coordination: Secondary | ICD-10-CM | POA: Diagnosis not present

## 2017-07-20 DIAGNOSIS — R2681 Unsteadiness on feet: Secondary | ICD-10-CM | POA: Diagnosis not present

## 2017-07-20 DIAGNOSIS — R2689 Other abnormalities of gait and mobility: Secondary | ICD-10-CM | POA: Diagnosis not present

## 2017-07-20 DIAGNOSIS — R278 Other lack of coordination: Secondary | ICD-10-CM | POA: Diagnosis not present

## 2017-07-20 DIAGNOSIS — Z9181 History of falling: Secondary | ICD-10-CM | POA: Diagnosis not present

## 2017-07-20 DIAGNOSIS — M6281 Muscle weakness (generalized): Secondary | ICD-10-CM | POA: Diagnosis not present

## 2017-07-23 DIAGNOSIS — M6281 Muscle weakness (generalized): Secondary | ICD-10-CM | POA: Diagnosis not present

## 2017-07-23 DIAGNOSIS — Z9181 History of falling: Secondary | ICD-10-CM | POA: Diagnosis not present

## 2017-07-23 DIAGNOSIS — R2681 Unsteadiness on feet: Secondary | ICD-10-CM | POA: Diagnosis not present

## 2017-07-23 DIAGNOSIS — R2689 Other abnormalities of gait and mobility: Secondary | ICD-10-CM | POA: Diagnosis not present

## 2017-07-23 DIAGNOSIS — R278 Other lack of coordination: Secondary | ICD-10-CM | POA: Diagnosis not present

## 2017-07-24 ENCOUNTER — Telehealth: Payer: Self-pay | Admitting: Internal Medicine

## 2017-07-24 ENCOUNTER — Other Ambulatory Visit: Payer: Self-pay | Admitting: Internal Medicine

## 2017-07-24 DIAGNOSIS — R278 Other lack of coordination: Secondary | ICD-10-CM | POA: Diagnosis not present

## 2017-07-24 DIAGNOSIS — M6281 Muscle weakness (generalized): Secondary | ICD-10-CM | POA: Diagnosis not present

## 2017-07-24 DIAGNOSIS — R2681 Unsteadiness on feet: Secondary | ICD-10-CM | POA: Diagnosis not present

## 2017-07-24 DIAGNOSIS — R2689 Other abnormalities of gait and mobility: Secondary | ICD-10-CM | POA: Diagnosis not present

## 2017-07-24 DIAGNOSIS — Z9181 History of falling: Secondary | ICD-10-CM | POA: Diagnosis not present

## 2017-07-24 NOTE — Telephone Encounter (Signed)
[place] LOV: 06/14/17 PCP: [facility]  Pharmacy: [facility] [place], [place]

## 2017-07-24 NOTE — Telephone Encounter (Signed)
Copied from [place]. Topic: Quick Communication - Rx Refill/Question >> Jul 24, 2017 10:47 AM Neva Seat wrote: traMADol Veatrice Bourbon) 50 MG tablet  Pt is out of Rx per [facility] - Resident care coordinator. - Phone if questions [phone]  [place], [place] [address] Phone: [phone] Fax: [phone]

## 2017-07-24 NOTE — Telephone Encounter (Unsigned)
Copied from [place]. Topic: Quick Communication - See Telephone Encounter >> Jul 24, 2017 10:51 AM Neva Seat wrote: Jolyne Loa Director at [employer] [facility]  Needs a call regarding pt's lists of RX's that was faxed that need to be signed off by Dr. Alain Marion. Or Can sign and fax back - [phone].

## 2017-07-25 DIAGNOSIS — M6281 Muscle weakness (generalized): Secondary | ICD-10-CM | POA: Diagnosis not present

## 2017-07-25 DIAGNOSIS — R278 Other lack of coordination: Secondary | ICD-10-CM | POA: Diagnosis not present

## 2017-07-25 DIAGNOSIS — R2689 Other abnormalities of gait and mobility: Secondary | ICD-10-CM | POA: Diagnosis not present

## 2017-07-25 DIAGNOSIS — Z9181 History of falling: Secondary | ICD-10-CM | POA: Diagnosis not present

## 2017-07-25 DIAGNOSIS — R2681 Unsteadiness on feet: Secondary | ICD-10-CM | POA: Diagnosis not present

## 2017-07-25 NOTE — Telephone Encounter (Signed)
[facility] is calling back to check on the status of prior authorization for traMADol (ULTRAM) 50 MG tablet Please advise. [address] [place], [place] [address]  Phone: [phone] Fax: [phone]

## 2017-07-26 DIAGNOSIS — Z9181 History of falling: Secondary | ICD-10-CM | POA: Diagnosis not present

## 2017-07-26 DIAGNOSIS — R2689 Other abnormalities of gait and mobility: Secondary | ICD-10-CM | POA: Diagnosis not present

## 2017-07-26 DIAGNOSIS — R2681 Unsteadiness on feet: Secondary | ICD-10-CM | POA: Diagnosis not present

## 2017-07-26 DIAGNOSIS — R278 Other lack of coordination: Secondary | ICD-10-CM | POA: Diagnosis not present

## 2017-07-26 DIAGNOSIS — M6281 Muscle weakness (generalized): Secondary | ICD-10-CM | POA: Diagnosis not present

## 2017-07-27 ENCOUNTER — Other Ambulatory Visit (INDEPENDENT_AMBULATORY_CARE_PROVIDER_SITE_OTHER): Payer: Medicare Other

## 2017-07-27 ENCOUNTER — Ambulatory Visit (INDEPENDENT_AMBULATORY_CARE_PROVIDER_SITE_OTHER): Payer: Medicare Other | Admitting: Internal Medicine

## 2017-07-27 ENCOUNTER — Encounter: Payer: Self-pay | Admitting: Internal Medicine

## 2017-07-27 DIAGNOSIS — N3281 Overactive bladder: Secondary | ICD-10-CM

## 2017-07-27 DIAGNOSIS — Z9181 History of falling: Secondary | ICD-10-CM | POA: Diagnosis not present

## 2017-07-27 DIAGNOSIS — I10 Essential (primary) hypertension: Secondary | ICD-10-CM | POA: Diagnosis not present

## 2017-07-27 DIAGNOSIS — M6281 Muscle weakness (generalized): Secondary | ICD-10-CM | POA: Diagnosis not present

## 2017-07-27 DIAGNOSIS — I6350 Cerebral infarction due to unspecified occlusion or stenosis of unspecified cerebral artery: Secondary | ICD-10-CM | POA: Diagnosis not present

## 2017-07-27 DIAGNOSIS — R278 Other lack of coordination: Secondary | ICD-10-CM | POA: Diagnosis not present

## 2017-07-27 DIAGNOSIS — R2689 Other abnormalities of gait and mobility: Secondary | ICD-10-CM | POA: Diagnosis not present

## 2017-07-27 DIAGNOSIS — I6302 Cerebral infarction due to thrombosis of basilar artery: Secondary | ICD-10-CM | POA: Diagnosis not present

## 2017-07-27 DIAGNOSIS — R2681 Unsteadiness on feet: Secondary | ICD-10-CM | POA: Diagnosis not present

## 2017-07-27 LAB — URINALYSIS
Bilirubin Urine: NEGATIVE
HGB URINE DIPSTICK: NEGATIVE
Ketones, ur: NEGATIVE
Leukocytes, UA: NEGATIVE
NITRITE: NEGATIVE
SPECIFIC GRAVITY, URINE: 1.01 (ref 1.000–1.030)
Total Protein, Urine: NEGATIVE
URINE GLUCOSE: NEGATIVE
Urobilinogen, UA: 0.2 (ref 0.0–1.0)
pH: 6 (ref 5.0–8.0)

## 2017-07-27 MED ORDER — TOLTERODINE TARTRATE ER 4 MG PO CP24: 4.0000 mg | ORAL_CAPSULE | Freq: Every day | ORAL | 11 refills | Status: AC

## 2017-07-27 NOTE — Assessment & Plan Note (Signed)
Lopressor Norvasc 

## 2017-07-27 NOTE — Assessment & Plan Note (Signed)
Generic Detrol if covered

## 2017-07-27 NOTE — Progress Notes (Signed)
Subjective:  Patient ID: Theresa Hampton, female    DOB: 13-Dec-1919  Age: 84 y.o. MRN: [id number]  CC: No chief complaint on file.   HPI CALEYAH JR presents for HTN, dyslipidemia, OA, CVA [facility] 3/19. C/o incontinence  Outpatient Medications Prior to Visit  Medication Sig Dispense Refill  . acetaminophen (TYLENOL) 325 MG tablet Take 2 tablets (650 mg total) by mouth every 6 (six) hours as needed.    Marland Kitchen amLODipine (NORVASC) 2.5 MG tablet Take 1 tablet (2.5 mg total) by mouth daily. 30 tablet 1  . atorvastatin (LIPITOR) 40 MG tablet Take 1 tablet (40 mg total) by mouth daily at 6 PM. 30 tablet 0  . cholecalciferol (VITAMIN D) 400 units TABS tablet Take 400 Units by mouth.    . CLONAZEPAM PO Take 0.25 mg by mouth as needed.    . clopidogrel (PLAVIX) 75 MG tablet Take 1 tablet (75 mg total) by mouth daily. 30 tablet 0  . diclofenac sodium (VOLTAREN) 1 % GEL Apply 2 g topically 4 (four) times daily. 4 Tube 0  . fluticasone (FLONASE) 50 MCG/ACT nasal spray Place into both nostrils daily.    Marland Kitchen gabapentin (NEURONTIN) 100 MG capsule TAKE 1 CAPSULE BY MOUTH AT BEDTIME 30 capsule 1  . loratadine (CLARITIN) 10 MG tablet Take 1 tablet (10 mg total) by mouth daily. 100 tablet 2  . LUMIGAN 0.01 % SOLN instill 1 drop IN [facility] EYE AT BEDTIME 7.5 mL 0  . Menthol-Methyl Salicylate (MUSCLE RUB) 10-15 % CREA Apply 1 application topically 2 (two) times daily as needed for muscle pain (BACK PAIN).  0  . mirtazapine (REMERON) 15 MG tablet TAKE ONE TABLET BY MOUTH EVERY EVENING BEFORE DINNER AT 4 - 5 PM 30 tablet 1  . Multiple Vitamins-Iron (MULTIVITAMINS WITH IRON) TABS tablet Take 1 tablet by mouth daily.  0  . senna-docusate (SENOKOT-S) 8.6-50 MG tablet Take 1 tablet by mouth at bedtime as needed for moderate constipation. 30 tablet 0  . traMADol (ULTRAM) 50 MG tablet Take 0.5 tablets (25 mg total) by mouth every 12 (twelve) hours as needed for severe pain. 10 tablet 0  . traZODone  (DESYREL) 50 MG tablet Take 0.5 tablets (25 mg total) by mouth at bedtime as needed for sleep. 15 tablet 0  . vitamin B-12 (CYANOCOBALAMIN) 100 MCG tablet Take 100 mcg by mouth daily.     No facility-administered medications prior to visit.     ROS Review of Systems  Constitutional: Positive for fatigue. Negative for activity change, appetite change, chills and unexpected weight change.  HENT: Negative for congestion, mouth sores and sinus pressure.   Eyes: Negative for visual disturbance.  Respiratory: Negative for cough and chest tightness.   Gastrointestinal: Negative for abdominal pain and nausea.  Genitourinary: Negative for difficulty urinating, frequency and vaginal pain.  Musculoskeletal: Positive for gait problem. Negative for back pain.  Skin: Negative for pallor and rash.  Neurological: Positive for weakness. Negative for dizziness, tremors, numbness and headaches.  Psychiatric/Behavioral: Negative for confusion and sleep disturbance.    Objective:  BP 124/66 (BP Location: Left Arm, Patient Position: Sitting, Cuff Size: Normal)   Pulse 77   Temp 97.6 F (36.4 C) (Oral)   Ht 5\' 5"  (1.651 m)   Wt 125 lb (56.7 kg)   LMP  (LMP Unknown)   SpO2 98%   BMI 20.80 kg/m   BP Readings from Last 3 Encounters:  07/27/17 124/66  07/12/17 120/63  07/06/17 118/67  Wt Readings from Last 3 Encounters:  07/27/17 125 lb (56.7 kg)  07/12/17 124 lb 6.4 oz (56.4 kg)  06/14/17 123 lb (55.8 kg)    Physical Exam  Constitutional: She appears well-developed. No distress.  HENT:  Head: Normocephalic.  Right Ear: External ear normal.  Left Ear: External ear normal.  Nose: Nose normal.  Mouth/Throat: Oropharynx is clear and moist.  Eyes: Pupils are equal, round, and reactive to light. Conjunctivae are normal. Right eye exhibits no discharge. Left eye exhibits no discharge.  Neck: Normal range of motion. Neck supple. No JVD present. No tracheal deviation present. No thyromegaly  present.  Cardiovascular: Normal rate, regular rhythm and normal heart sounds.  Pulmonary/Chest: No stridor. No respiratory distress. She has no wheezes.  Abdominal: Soft. Bowel sounds are normal. She exhibits no distension and no mass. There is no tenderness. There is no rebound and no guarding.  Musculoskeletal: She exhibits no edema or tenderness.  Lymphadenopathy:    She has no cervical adenopathy.  Neurological: She displays normal reflexes. No cranial nerve deficit. She exhibits normal muscle tone. Coordination abnormal.  Skin: No rash noted. No erythema.  Psychiatric: She has a normal mood and affect. Her behavior is normal. Judgment and thought content normal.   A/o/c In a w/c  Lab Results  Component Value Date   WBC 7.4 05/22/2017   HGB 10.9 (L) 05/22/2017   HCT 34.5 (L) 05/22/2017   PLT 364 05/22/2017   GLUCOSE 102 (H) 05/22/2017   CHOL 217 (H) 05/07/2017   TRIG 94 05/07/2017   HDL 65 05/07/2017   LDLDIRECT 125.4 05/10/2011   LDLCALC 133 (H) 05/07/2017   ALT 16 05/10/2017   AST 25 05/10/2017   NA 138 05/22/2017   K 4.2 05/22/2017   CL 104 05/22/2017   CREATININE 0.74 05/22/2017   BUN 10 05/22/2017   CO2 26 05/22/2017   TSH 3.83 08/01/2016   INR 1.01 05/06/2017   HGBA1C 5.3 05/07/2017    No results found.  Assessment & Plan:   There are no diagnoses linked to this encounter. I am having Nefertari C. Cowell maintain her loratadine, senna-docusate, acetaminophen, MUSCLE RUB, multivitamins with iron, amLODipine, atorvastatin, clopidogrel, gabapentin, traMADol, traZODone, mirtazapine, LUMIGAN, diclofenac sodium, vitamin B-12, cholecalciferol, fluticasone, and CLONAZEPAM PO.  No orders of the defined types were placed in this encounter.    Follow-up: No follow-ups on file.  Walker Kehr, MD

## 2017-07-27 NOTE — Assessment & Plan Note (Signed)
L facial droop, ataxia 1/19 In a w/c. Using a walker

## 2017-07-30 DIAGNOSIS — M6281 Muscle weakness (generalized): Secondary | ICD-10-CM | POA: Diagnosis not present

## 2017-07-30 DIAGNOSIS — R278 Other lack of coordination: Secondary | ICD-10-CM | POA: Diagnosis not present

## 2017-07-30 DIAGNOSIS — R2681 Unsteadiness on feet: Secondary | ICD-10-CM | POA: Diagnosis not present

## 2017-07-30 DIAGNOSIS — Z9181 History of falling: Secondary | ICD-10-CM | POA: Diagnosis not present

## 2017-07-30 DIAGNOSIS — R2689 Other abnormalities of gait and mobility: Secondary | ICD-10-CM | POA: Diagnosis not present

## 2017-07-30 MED ORDER — TRAMADOL HCL 50 MG PO TABS: 25.0000 mg | ORAL_TABLET | Freq: Two times a day (BID) | ORAL | 5 refills | Status: DC | PRN

## 2017-07-30 NOTE — Telephone Encounter (Signed)
Rx has been faxed to [facility].Marland KitchenJohny Chess

## 2017-07-31 DIAGNOSIS — R2681 Unsteadiness on feet: Secondary | ICD-10-CM | POA: Diagnosis not present

## 2017-07-31 DIAGNOSIS — Z9181 History of falling: Secondary | ICD-10-CM | POA: Diagnosis not present

## 2017-07-31 DIAGNOSIS — M6281 Muscle weakness (generalized): Secondary | ICD-10-CM | POA: Diagnosis not present

## 2017-07-31 DIAGNOSIS — R2689 Other abnormalities of gait and mobility: Secondary | ICD-10-CM | POA: Diagnosis not present

## 2017-07-31 DIAGNOSIS — R278 Other lack of coordination: Secondary | ICD-10-CM | POA: Diagnosis not present

## 2017-08-01 DIAGNOSIS — R278 Other lack of coordination: Secondary | ICD-10-CM | POA: Diagnosis not present

## 2017-08-01 DIAGNOSIS — R2681 Unsteadiness on feet: Secondary | ICD-10-CM | POA: Diagnosis not present

## 2017-08-01 DIAGNOSIS — R2689 Other abnormalities of gait and mobility: Secondary | ICD-10-CM | POA: Diagnosis not present

## 2017-08-01 DIAGNOSIS — Z9181 History of falling: Secondary | ICD-10-CM | POA: Diagnosis not present

## 2017-08-01 DIAGNOSIS — M6281 Muscle weakness (generalized): Secondary | ICD-10-CM | POA: Diagnosis not present

## 2017-08-01 NOTE — Telephone Encounter (Signed)
Form was faxed back.

## 2017-08-02 DIAGNOSIS — R2681 Unsteadiness on feet: Secondary | ICD-10-CM | POA: Diagnosis not present

## 2017-08-02 DIAGNOSIS — M6281 Muscle weakness (generalized): Secondary | ICD-10-CM | POA: Diagnosis not present

## 2017-08-02 DIAGNOSIS — R2689 Other abnormalities of gait and mobility: Secondary | ICD-10-CM | POA: Diagnosis not present

## 2017-08-02 DIAGNOSIS — R278 Other lack of coordination: Secondary | ICD-10-CM | POA: Diagnosis not present

## 2017-08-02 DIAGNOSIS — Z9181 History of falling: Secondary | ICD-10-CM | POA: Diagnosis not present

## 2017-08-03 DIAGNOSIS — R2681 Unsteadiness on feet: Secondary | ICD-10-CM | POA: Diagnosis not present

## 2017-08-03 DIAGNOSIS — M6281 Muscle weakness (generalized): Secondary | ICD-10-CM | POA: Diagnosis not present

## 2017-08-03 DIAGNOSIS — R278 Other lack of coordination: Secondary | ICD-10-CM | POA: Diagnosis not present

## 2017-08-03 DIAGNOSIS — R2689 Other abnormalities of gait and mobility: Secondary | ICD-10-CM | POA: Diagnosis not present

## 2017-08-03 DIAGNOSIS — Z9181 History of falling: Secondary | ICD-10-CM | POA: Diagnosis not present

## 2017-08-06 DIAGNOSIS — R2689 Other abnormalities of gait and mobility: Secondary | ICD-10-CM | POA: Diagnosis not present

## 2017-08-06 DIAGNOSIS — R2681 Unsteadiness on feet: Secondary | ICD-10-CM | POA: Diagnosis not present

## 2017-08-06 DIAGNOSIS — M6281 Muscle weakness (generalized): Secondary | ICD-10-CM | POA: Diagnosis not present

## 2017-08-06 DIAGNOSIS — R41841 Cognitive communication deficit: Secondary | ICD-10-CM | POA: Diagnosis not present

## 2017-08-06 DIAGNOSIS — R278 Other lack of coordination: Secondary | ICD-10-CM | POA: Diagnosis not present

## 2017-08-06 DIAGNOSIS — Z9181 History of falling: Secondary | ICD-10-CM | POA: Diagnosis not present

## 2017-08-07 DIAGNOSIS — R41841 Cognitive communication deficit: Secondary | ICD-10-CM | POA: Diagnosis not present

## 2017-08-07 DIAGNOSIS — R2681 Unsteadiness on feet: Secondary | ICD-10-CM | POA: Diagnosis not present

## 2017-08-07 DIAGNOSIS — M6281 Muscle weakness (generalized): Secondary | ICD-10-CM | POA: Diagnosis not present

## 2017-08-07 DIAGNOSIS — R2689 Other abnormalities of gait and mobility: Secondary | ICD-10-CM | POA: Diagnosis not present

## 2017-08-07 DIAGNOSIS — R278 Other lack of coordination: Secondary | ICD-10-CM | POA: Diagnosis not present

## 2017-08-07 DIAGNOSIS — Z9181 History of falling: Secondary | ICD-10-CM | POA: Diagnosis not present

## 2017-08-08 DIAGNOSIS — Z9181 History of falling: Secondary | ICD-10-CM | POA: Diagnosis not present

## 2017-08-08 DIAGNOSIS — M6281 Muscle weakness (generalized): Secondary | ICD-10-CM | POA: Diagnosis not present

## 2017-08-08 DIAGNOSIS — R278 Other lack of coordination: Secondary | ICD-10-CM | POA: Diagnosis not present

## 2017-08-08 DIAGNOSIS — R2689 Other abnormalities of gait and mobility: Secondary | ICD-10-CM | POA: Diagnosis not present

## 2017-08-08 DIAGNOSIS — R41841 Cognitive communication deficit: Secondary | ICD-10-CM | POA: Diagnosis not present

## 2017-08-08 DIAGNOSIS — R2681 Unsteadiness on feet: Secondary | ICD-10-CM | POA: Diagnosis not present

## 2017-08-09 DIAGNOSIS — Z9181 History of falling: Secondary | ICD-10-CM | POA: Diagnosis not present

## 2017-08-09 DIAGNOSIS — R41841 Cognitive communication deficit: Secondary | ICD-10-CM | POA: Diagnosis not present

## 2017-08-09 DIAGNOSIS — R2689 Other abnormalities of gait and mobility: Secondary | ICD-10-CM | POA: Diagnosis not present

## 2017-08-09 DIAGNOSIS — R278 Other lack of coordination: Secondary | ICD-10-CM | POA: Diagnosis not present

## 2017-08-09 DIAGNOSIS — M6281 Muscle weakness (generalized): Secondary | ICD-10-CM | POA: Diagnosis not present

## 2017-08-09 DIAGNOSIS — R2681 Unsteadiness on feet: Secondary | ICD-10-CM | POA: Diagnosis not present

## 2017-08-13 DIAGNOSIS — M6281 Muscle weakness (generalized): Secondary | ICD-10-CM | POA: Diagnosis not present

## 2017-08-13 DIAGNOSIS — R41841 Cognitive communication deficit: Secondary | ICD-10-CM | POA: Diagnosis not present

## 2017-08-13 DIAGNOSIS — R278 Other lack of coordination: Secondary | ICD-10-CM | POA: Diagnosis not present

## 2017-08-13 DIAGNOSIS — Z9181 History of falling: Secondary | ICD-10-CM | POA: Diagnosis not present

## 2017-08-13 DIAGNOSIS — R2681 Unsteadiness on feet: Secondary | ICD-10-CM | POA: Diagnosis not present

## 2017-08-13 DIAGNOSIS — R2689 Other abnormalities of gait and mobility: Secondary | ICD-10-CM | POA: Diagnosis not present

## 2017-08-14 DIAGNOSIS — R2681 Unsteadiness on feet: Secondary | ICD-10-CM | POA: Diagnosis not present

## 2017-08-14 DIAGNOSIS — R278 Other lack of coordination: Secondary | ICD-10-CM | POA: Diagnosis not present

## 2017-08-14 DIAGNOSIS — Z9181 History of falling: Secondary | ICD-10-CM | POA: Diagnosis not present

## 2017-08-14 DIAGNOSIS — R2689 Other abnormalities of gait and mobility: Secondary | ICD-10-CM | POA: Diagnosis not present

## 2017-08-14 DIAGNOSIS — R41841 Cognitive communication deficit: Secondary | ICD-10-CM | POA: Diagnosis not present

## 2017-08-14 DIAGNOSIS — M6281 Muscle weakness (generalized): Secondary | ICD-10-CM | POA: Diagnosis not present

## 2017-08-15 DIAGNOSIS — R41841 Cognitive communication deficit: Secondary | ICD-10-CM | POA: Diagnosis not present

## 2017-08-15 DIAGNOSIS — M6281 Muscle weakness (generalized): Secondary | ICD-10-CM | POA: Diagnosis not present

## 2017-08-15 DIAGNOSIS — R2689 Other abnormalities of gait and mobility: Secondary | ICD-10-CM | POA: Diagnosis not present

## 2017-08-15 DIAGNOSIS — Z9181 History of falling: Secondary | ICD-10-CM | POA: Diagnosis not present

## 2017-08-15 DIAGNOSIS — R2681 Unsteadiness on feet: Secondary | ICD-10-CM | POA: Diagnosis not present

## 2017-08-15 DIAGNOSIS — R278 Other lack of coordination: Secondary | ICD-10-CM | POA: Diagnosis not present

## 2017-08-16 DIAGNOSIS — M6281 Muscle weakness (generalized): Secondary | ICD-10-CM | POA: Diagnosis not present

## 2017-08-16 DIAGNOSIS — R41841 Cognitive communication deficit: Secondary | ICD-10-CM | POA: Diagnosis not present

## 2017-08-16 DIAGNOSIS — Z9181 History of falling: Secondary | ICD-10-CM | POA: Diagnosis not present

## 2017-08-16 DIAGNOSIS — R2681 Unsteadiness on feet: Secondary | ICD-10-CM | POA: Diagnosis not present

## 2017-08-16 DIAGNOSIS — R2689 Other abnormalities of gait and mobility: Secondary | ICD-10-CM | POA: Diagnosis not present

## 2017-08-16 DIAGNOSIS — R278 Other lack of coordination: Secondary | ICD-10-CM | POA: Diagnosis not present

## 2017-08-17 ENCOUNTER — Encounter: Payer: Self-pay | Admitting: Physical Medicine & Rehabilitation

## 2017-08-17 ENCOUNTER — Ambulatory Visit (HOSPITAL_BASED_OUTPATIENT_CLINIC_OR_DEPARTMENT_OTHER): Payer: Medicare Other | Admitting: Physical Medicine & Rehabilitation

## 2017-08-17 ENCOUNTER — Encounter: Payer: Medicare Other | Attending: Physical Medicine & Rehabilitation

## 2017-08-17 VITALS — BP 126/62 | HR 85 | Ht 65.0 in | Wt 126.0 lb

## 2017-08-17 DIAGNOSIS — R262 Difficulty in walking, not elsewhere classified: Secondary | ICD-10-CM | POA: Insufficient documentation

## 2017-08-17 DIAGNOSIS — Z9889 Other specified postprocedural states: Secondary | ICD-10-CM | POA: Diagnosis not present

## 2017-08-17 DIAGNOSIS — Z809 Family history of malignant neoplasm, unspecified: Secondary | ICD-10-CM | POA: Diagnosis not present

## 2017-08-17 DIAGNOSIS — Z96643 Presence of artificial hip joint, bilateral: Secondary | ICD-10-CM | POA: Insufficient documentation

## 2017-08-17 DIAGNOSIS — Z8249 Family history of ischemic heart disease and other diseases of the circulatory system: Secondary | ICD-10-CM | POA: Insufficient documentation

## 2017-08-17 DIAGNOSIS — I6350 Cerebral infarction due to unspecified occlusion or stenosis of unspecified cerebral artery: Secondary | ICD-10-CM | POA: Diagnosis not present

## 2017-08-17 DIAGNOSIS — I69398 Other sequelae of cerebral infarction: Secondary | ICD-10-CM | POA: Diagnosis not present

## 2017-08-17 DIAGNOSIS — R41841 Cognitive communication deficit: Secondary | ICD-10-CM | POA: Diagnosis not present

## 2017-08-17 DIAGNOSIS — Z8261 Family history of arthritis: Secondary | ICD-10-CM | POA: Insufficient documentation

## 2017-08-17 DIAGNOSIS — R2689 Other abnormalities of gait and mobility: Secondary | ICD-10-CM | POA: Diagnosis not present

## 2017-08-17 DIAGNOSIS — M25532 Pain in left wrist: Secondary | ICD-10-CM | POA: Insufficient documentation

## 2017-08-17 DIAGNOSIS — M810 Age-related osteoporosis without current pathological fracture: Secondary | ICD-10-CM | POA: Diagnosis not present

## 2017-08-17 DIAGNOSIS — N189 Chronic kidney disease, unspecified: Secondary | ICD-10-CM | POA: Insufficient documentation

## 2017-08-17 DIAGNOSIS — R269 Unspecified abnormalities of gait and mobility: Secondary | ICD-10-CM | POA: Diagnosis not present

## 2017-08-17 DIAGNOSIS — I129 Hypertensive chronic kidney disease with stage 1 through stage 4 chronic kidney disease, or unspecified chronic kidney disease: Secondary | ICD-10-CM | POA: Diagnosis not present

## 2017-08-17 DIAGNOSIS — R1310 Dysphagia, unspecified: Secondary | ICD-10-CM | POA: Diagnosis not present

## 2017-08-17 DIAGNOSIS — Z9181 History of falling: Secondary | ICD-10-CM | POA: Insufficient documentation

## 2017-08-17 DIAGNOSIS — I69313 Psychomotor deficit following cerebral infarction: Secondary | ICD-10-CM | POA: Diagnosis not present

## 2017-08-17 DIAGNOSIS — J309 Allergic rhinitis, unspecified: Secondary | ICD-10-CM | POA: Diagnosis not present

## 2017-08-17 DIAGNOSIS — I69391 Dysphagia following cerebral infarction: Secondary | ICD-10-CM | POA: Diagnosis not present

## 2017-08-17 DIAGNOSIS — M1990 Unspecified osteoarthritis, unspecified site: Secondary | ICD-10-CM | POA: Diagnosis not present

## 2017-08-17 DIAGNOSIS — R2681 Unsteadiness on feet: Secondary | ICD-10-CM | POA: Diagnosis not present

## 2017-08-17 DIAGNOSIS — M6281 Muscle weakness (generalized): Secondary | ICD-10-CM | POA: Diagnosis not present

## 2017-08-17 DIAGNOSIS — R278 Other lack of coordination: Secondary | ICD-10-CM | POA: Diagnosis not present

## 2017-08-17 NOTE — Progress Notes (Signed)
Subjective:    Patient ID: Theresa Hampton, female    DOB: January 29, 1920, 84 y.o.   MRN: [id number] 84 y.o.left handed femalewith history of HTN, CKD, OA, B-12 deficiency, one week history of difficulty walking progressing to increased falls.She was admitted on 12/30/18AMafter found by family with left facial droop. MRI brain revealed right pontine stroke with moderate stenosis at origin of R-VA and moderate stenosis L-SCA. Neurology recommended changing ASA to Plavix for stroke likely due to SVD. She had reports of buttock pain as well as left wrist painat admission andX rays of pelvis showed evidence of prior sacral fracture and left wrist films showed moderate degenerative changes with soft tissue swelling distal wrist.Patient with resultant deficits in functional mobility, dysphagia as well as difficulty completing ADLs.  HPI  Here with daughter Has followed up with Neuro and IM since my last visit no med changes Needs supervision for amb with walker Working towards transition to [place] sup for showering, dresses self except for shoes Pt in assisted living at [facility], has option of group exercise once her therapy stops  Pain Inventory Average Pain 0 Pain Right Now 0 My pain is na  In the last 24 hours, has pain interfered with the following? General activity 0 Relation with others 0 Enjoyment of life 0 What TIME of day is your pain at its worst? na Sleep (in general) Good  Pain is worse with: na Pain improves with: na Relief from Meds: na  Mobility walk with assistance use a walker do you drive?  no use a wheelchair transfers alone  Function retired I need assistance with the following:  dressing, bathing, meal prep, household duties and shopping  Neuro/Psych bladder control problems weakness trouble walking  Prior Studies Any changes since last visit?  no  Physicians involved in your care Any changes since last visit?  no   Family  History  Problem Relation Age of Onset  . Arthritis Mother   . Hypertension Unknown   . Cancer Sister    Social History   Socioeconomic History  . Marital status: Widowed    Spouse name: Not on file  . Number of children: Not on file  . Years of education: Not on file  . Highest education level: Not on file  Occupational History  . Occupation: retired  Scientific laboratory technician  . Financial resource strain: Not on file  . Food insecurity:    Worry: Not on file    Inability: Not on file  . Transportation needs:    Medical: Not on file    Non-medical: Not on file  Tobacco Use  . Smoking status: Never Smoker  . Smokeless tobacco: Never Used  Substance and Sexual Activity  . Alcohol use: No  . Drug use: No  . Sexual activity: Not Currently  Lifestyle  . Physical activity:    Days per week: Not on file    Minutes per session: Not on file  . Stress: Not on file  Relationships  . Social connections:    Talks on phone: Not on file    Gets together: Not on file    Attends religious service: Not on file    Active member of club or organization: Not on file    Attends meetings of clubs or organizations: Not on file    Relationship status: Not on file  Other Topics Concern  . Not on file  Social History Narrative  . Not on file   Past Surgical History:  Procedure Laterality Date  . CATARACT EXTRACTION, BILATERAL Bilateral   . DILATION AND CURETTAGE OF UTERUS    . ENDOMETRIAL ABLATION  2008  . JOINT REPLACEMENT    . THYROIDECTOMY, PARTIAL  "many years ago"  . TOTAL HIP ARTHROPLASTY Left 01/06/2015   Procedure: LEFT TOTAL HIP ARTHROPLASTY ANTERIOR APPROACH;  Surgeon: Gaynelle Arabian, MD;  Location: [facility];  Service: Orthopedics;  Laterality: Left;  . TOTAL HIP ARTHROPLASTY Right 2004   Past Medical History:  Diagnosis Date  . Allergic rhinitis   . Arthritis    "hips" (05/08/2017)  . B12 deficiency anemia   . Chronic sinusitis   . Daily headache    "q am" (05/08/2017)  . History of  transfusion 2004   "w/1st hip OR" (05/08/2017)  . Hyperlipidemia   . Hypertension   . Iron deficiency anemia   . Leg cramps   . Osteoarthritis    Dr. Maureen Ralphs  . Osteoporosis   . Peptic ulcer disease 2003  . PONV (postoperative nausea and vomiting)   . Pyelonephritis, acute 2014  . Seasonal allergies   . Seizures (HCC)    x1 with Lidocaine  . Stroke ([place]) 05/05/2017  . Vitamin B12 deficiency   . Vitamin D deficiency    LMP  (LMP Unknown)   Opioid Risk Score:   Fall Risk Score:  `1  Depression screen PHQ 2/9  Depression screen [facility] 2/9 05/23/2016 10/14/2014  Decreased Interest 0 1  Down, Depressed, Hopeless 0 0  PHQ - 2 Score 0 1     Review of Systems  Constitutional: Negative.   HENT: Negative.   Eyes: Negative.   Respiratory: Negative.   Cardiovascular: Negative.   Gastrointestinal: Negative.   Endocrine: Negative.   Genitourinary: Positive for difficulty urinating.  Musculoskeletal: Positive for gait problem.  Skin: Negative.   Allergic/Immunologic: Negative.   Neurological: Positive for facial asymmetry and weakness.  Hematological: Negative.   Psychiatric/Behavioral: Negative.   All other systems reviewed and are negative.      Objective:   Physical Exam  Constitutional: She is oriented to person, place, and time. She appears well-developed and well-nourished. No distress.  HENT:  Head: Normocephalic and atraumatic.  Eyes: Pupils are equal, round, and reactive to light. Conjunctivae and EOM are normal.  Neck: Normal range of motion.  Cardiovascular:  1+ pedal edema, trace pretibial edema  Neurological: She is alert and oriented to person, place, and time. She displays no atrophy and no tremor. No sensory deficit. She exhibits normal muscle tone.  5/5 RIght delt biceps, triceps grip, 4+ left delt , biceps , triceps , grip 5/5 B HF, KE ADF  Sensation equal to LT in BUE and BLE  Standing balance is fair, sit to stand with Supervision  Skin: Skin is warm  and dry. She is not diaphoretic.  Nursing note and vitals reviewed.         Assessment & Plan:  1.  Right pontine infarct with left hemiparesis  Functional status starting to plateau but has postential for mod I with rollator And Mod I dressing Cont OP PT OT  Followup with Neuro and IM for secondary stroke prevention  PMR f/u 3 m

## 2017-08-17 NOTE — Patient Instructions (Signed)
PLEASE CONTINUE WITH YOUR EXERCISES ONCE THERAPY FINISHES

## 2017-08-20 DIAGNOSIS — Z9181 History of falling: Secondary | ICD-10-CM | POA: Diagnosis not present

## 2017-08-20 DIAGNOSIS — R2689 Other abnormalities of gait and mobility: Secondary | ICD-10-CM | POA: Diagnosis not present

## 2017-08-20 DIAGNOSIS — R278 Other lack of coordination: Secondary | ICD-10-CM | POA: Diagnosis not present

## 2017-08-20 DIAGNOSIS — R2681 Unsteadiness on feet: Secondary | ICD-10-CM | POA: Diagnosis not present

## 2017-08-20 DIAGNOSIS — M6281 Muscle weakness (generalized): Secondary | ICD-10-CM | POA: Diagnosis not present

## 2017-08-20 DIAGNOSIS — R41841 Cognitive communication deficit: Secondary | ICD-10-CM | POA: Diagnosis not present

## 2017-08-21 DIAGNOSIS — Z9181 History of falling: Secondary | ICD-10-CM | POA: Diagnosis not present

## 2017-08-21 DIAGNOSIS — R41841 Cognitive communication deficit: Secondary | ICD-10-CM | POA: Diagnosis not present

## 2017-08-21 DIAGNOSIS — R2681 Unsteadiness on feet: Secondary | ICD-10-CM | POA: Diagnosis not present

## 2017-08-21 DIAGNOSIS — R278 Other lack of coordination: Secondary | ICD-10-CM | POA: Diagnosis not present

## 2017-08-21 DIAGNOSIS — R2689 Other abnormalities of gait and mobility: Secondary | ICD-10-CM | POA: Diagnosis not present

## 2017-08-21 DIAGNOSIS — M6281 Muscle weakness (generalized): Secondary | ICD-10-CM | POA: Diagnosis not present

## 2017-08-22 DIAGNOSIS — H401132 Primary open-angle glaucoma, bilateral, moderate stage: Secondary | ICD-10-CM | POA: Diagnosis not present

## 2017-08-22 DIAGNOSIS — Z961 Presence of intraocular lens: Secondary | ICD-10-CM | POA: Diagnosis not present

## 2017-08-22 DIAGNOSIS — H04123 Dry eye syndrome of bilateral lacrimal glands: Secondary | ICD-10-CM | POA: Diagnosis not present

## 2017-08-22 DIAGNOSIS — H26491 Other secondary cataract, right eye: Secondary | ICD-10-CM | POA: Diagnosis not present

## 2017-08-24 DIAGNOSIS — M6281 Muscle weakness (generalized): Secondary | ICD-10-CM | POA: Diagnosis not present

## 2017-08-24 DIAGNOSIS — R41841 Cognitive communication deficit: Secondary | ICD-10-CM | POA: Diagnosis not present

## 2017-08-24 DIAGNOSIS — Z9181 History of falling: Secondary | ICD-10-CM | POA: Diagnosis not present

## 2017-08-24 DIAGNOSIS — R2689 Other abnormalities of gait and mobility: Secondary | ICD-10-CM | POA: Diagnosis not present

## 2017-08-24 DIAGNOSIS — R2681 Unsteadiness on feet: Secondary | ICD-10-CM | POA: Diagnosis not present

## 2017-08-24 DIAGNOSIS — R278 Other lack of coordination: Secondary | ICD-10-CM | POA: Diagnosis not present

## 2017-08-27 DIAGNOSIS — R278 Other lack of coordination: Secondary | ICD-10-CM | POA: Diagnosis not present

## 2017-08-27 DIAGNOSIS — R41841 Cognitive communication deficit: Secondary | ICD-10-CM | POA: Diagnosis not present

## 2017-08-27 DIAGNOSIS — R2689 Other abnormalities of gait and mobility: Secondary | ICD-10-CM | POA: Diagnosis not present

## 2017-08-27 DIAGNOSIS — M6281 Muscle weakness (generalized): Secondary | ICD-10-CM | POA: Diagnosis not present

## 2017-08-27 DIAGNOSIS — Z9181 History of falling: Secondary | ICD-10-CM | POA: Diagnosis not present

## 2017-08-27 DIAGNOSIS — R2681 Unsteadiness on feet: Secondary | ICD-10-CM | POA: Diagnosis not present

## 2017-08-28 DIAGNOSIS — R2681 Unsteadiness on feet: Secondary | ICD-10-CM | POA: Diagnosis not present

## 2017-08-28 DIAGNOSIS — R278 Other lack of coordination: Secondary | ICD-10-CM | POA: Diagnosis not present

## 2017-08-28 DIAGNOSIS — Z9181 History of falling: Secondary | ICD-10-CM | POA: Diagnosis not present

## 2017-08-28 DIAGNOSIS — R2689 Other abnormalities of gait and mobility: Secondary | ICD-10-CM | POA: Diagnosis not present

## 2017-08-28 DIAGNOSIS — M6281 Muscle weakness (generalized): Secondary | ICD-10-CM | POA: Diagnosis not present

## 2017-08-28 DIAGNOSIS — R41841 Cognitive communication deficit: Secondary | ICD-10-CM | POA: Diagnosis not present

## 2017-08-29 ENCOUNTER — Telehealth: Payer: Self-pay | Admitting: Internal Medicine

## 2017-08-29 DIAGNOSIS — R278 Other lack of coordination: Secondary | ICD-10-CM | POA: Diagnosis not present

## 2017-08-29 DIAGNOSIS — R2681 Unsteadiness on feet: Secondary | ICD-10-CM | POA: Diagnosis not present

## 2017-08-29 DIAGNOSIS — R2689 Other abnormalities of gait and mobility: Secondary | ICD-10-CM | POA: Diagnosis not present

## 2017-08-29 DIAGNOSIS — R600 Localized edema: Secondary | ICD-10-CM

## 2017-08-29 DIAGNOSIS — R41841 Cognitive communication deficit: Secondary | ICD-10-CM | POA: Diagnosis not present

## 2017-08-29 DIAGNOSIS — M6281 Muscle weakness (generalized): Secondary | ICD-10-CM | POA: Diagnosis not present

## 2017-08-29 DIAGNOSIS — Z9181 History of falling: Secondary | ICD-10-CM | POA: Diagnosis not present

## 2017-08-29 NOTE — Telephone Encounter (Signed)
Copied from [place] [phone]. Topic: Quick Communication - See Telephone Encounter >> Aug 29, 2017 12:17 PM Bea Graff, NT wrote: CRM for notification. See Telephone encounter for: 08/29/17.Theresa Hampton a PT with [employer] at [facility] is calling and states that PT and OT has noticed increase edema in pts left foot and the facility has put a compression stocking on the left foot but not the right. She suggests if the doctor can send a order to the facility to put compression stocking of both legs instead of just the left. FAX#: [phone]. CB#: [phone]

## 2017-08-30 DIAGNOSIS — R2689 Other abnormalities of gait and mobility: Secondary | ICD-10-CM | POA: Diagnosis not present

## 2017-08-30 DIAGNOSIS — R41841 Cognitive communication deficit: Secondary | ICD-10-CM | POA: Diagnosis not present

## 2017-08-30 DIAGNOSIS — R278 Other lack of coordination: Secondary | ICD-10-CM | POA: Diagnosis not present

## 2017-08-30 DIAGNOSIS — Z9181 History of falling: Secondary | ICD-10-CM | POA: Diagnosis not present

## 2017-08-30 DIAGNOSIS — M6281 Muscle weakness (generalized): Secondary | ICD-10-CM | POA: Diagnosis not present

## 2017-08-30 DIAGNOSIS — R2681 Unsteadiness on feet: Secondary | ICD-10-CM | POA: Diagnosis not present

## 2017-08-30 NOTE — Telephone Encounter (Signed)
Patient's daughter came by requesting the order for the compression stocking to also be used on the right leg.

## 2017-08-31 NOTE — Telephone Encounter (Signed)
Dyanne Iha MD ok order faxed to [phone] also faxed to [facility] @ [phone] as well.Marland KitchenJohny Chess

## 2017-08-31 NOTE — Telephone Encounter (Signed)
Ok Thx 

## 2017-09-03 DIAGNOSIS — R41841 Cognitive communication deficit: Secondary | ICD-10-CM | POA: Diagnosis not present

## 2017-09-03 DIAGNOSIS — R2689 Other abnormalities of gait and mobility: Secondary | ICD-10-CM | POA: Diagnosis not present

## 2017-09-03 DIAGNOSIS — Z9181 History of falling: Secondary | ICD-10-CM | POA: Diagnosis not present

## 2017-09-03 DIAGNOSIS — R278 Other lack of coordination: Secondary | ICD-10-CM | POA: Diagnosis not present

## 2017-09-03 DIAGNOSIS — M6281 Muscle weakness (generalized): Secondary | ICD-10-CM | POA: Diagnosis not present

## 2017-09-03 DIAGNOSIS — R2681 Unsteadiness on feet: Secondary | ICD-10-CM | POA: Diagnosis not present

## 2017-09-04 DIAGNOSIS — M6281 Muscle weakness (generalized): Secondary | ICD-10-CM | POA: Diagnosis not present

## 2017-09-04 DIAGNOSIS — R2689 Other abnormalities of gait and mobility: Secondary | ICD-10-CM | POA: Diagnosis not present

## 2017-09-04 DIAGNOSIS — R2681 Unsteadiness on feet: Secondary | ICD-10-CM | POA: Diagnosis not present

## 2017-09-04 DIAGNOSIS — R278 Other lack of coordination: Secondary | ICD-10-CM | POA: Diagnosis not present

## 2017-09-04 DIAGNOSIS — Z9181 History of falling: Secondary | ICD-10-CM | POA: Diagnosis not present

## 2017-09-04 DIAGNOSIS — R41841 Cognitive communication deficit: Secondary | ICD-10-CM | POA: Diagnosis not present

## 2017-09-25 ENCOUNTER — Ambulatory Visit: Payer: Self-pay | Admitting: *Deleted

## 2017-09-25 NOTE — Telephone Encounter (Signed)
Patient information- Patient's daughter- Theresa Hampton is calling with concerns that patient has swelling in her feet so bad that she can not participate in chair PT at [facility]. Patient is resident and since the stroke she had in December- she is having swelling in both her feet that is effecting her walking and she is sitting a lot.  Reason for Disposition . [1] MODERATE leg swelling (e.g., swelling extends up to knees) AND [2] new onset or worsening  Answer Assessment - Initial Assessment Questions 1. ONSET: "When did the swelling start?" (e.g., minutes, hours, days)     Patient's foot swelling got worse 4/29 2. LOCATION: "What part of the leg is swollen?"  "Are both legs swollen or just one leg?"     Bilateral feet swelling to mid calve 3. SEVERITY: "How bad is the swelling?" (e.g., localized; mild, moderate, severe)  - Localized - small area of swelling localized to one leg  - MILD pedal edema - swelling limited to foot and ankle, pitting edema < 1/4 inch (6 mm) deep, rest and elevation eliminate most or all swelling  - MODERATE edema - swelling of lower leg to knee, pitting edema > 1/4 inch (6 mm) deep, rest and elevation only partially reduce swelling  - SEVERE edema - swelling extends above knee, facial or hand swelling present      moderate 4. REDNESS: "Does the swelling look red or infected?"     no 5. PAIN: "Is the swelling painful to touch?" If so, ask: "How painful is it?"   (Scale 1-10; mild, moderate or severe)     No-feels pressure and tight 6. FEVER: "Do you have a fever?" If so, ask: "What is it, how was it measured, and when did it start?"      no 7. CAUSE: "What do you think is causing the leg swelling?"     embolization and poor circulation 8. MEDICAL HISTORY: "Do you have a history of heart failure, kidney disease, liver failure, or cancer?"     Hx stroke 9. RECURRENT SYMPTOM: "Have you had leg swelling before?" If so, ask: "When was the last time?" "What happened that  time?"     Never this bad 10. OTHER SYMPTOMS: "Do you have any other symptoms?" (e.g., chest pain, difficulty breathing)       no 11. PREGNANCY: "Is there any chance you are pregnant?" "When was your last menstrual period?"       n/a  Protocols used: LEG SWELLING AND EDEMA-A-AH

## 2017-09-26 NOTE — Telephone Encounter (Signed)
Please advise 

## 2017-09-27 ENCOUNTER — Encounter: Payer: Self-pay | Admitting: Family

## 2017-09-27 ENCOUNTER — Telehealth: Payer: Self-pay | Admitting: Internal Medicine

## 2017-09-27 ENCOUNTER — Ambulatory Visit (INDEPENDENT_AMBULATORY_CARE_PROVIDER_SITE_OTHER): Payer: Medicare Other | Admitting: Family

## 2017-09-27 VITALS — BP 122/74 | HR 82 | Temp 98.2°F | Ht 65.0 in | Wt 130.1 lb

## 2017-09-27 DIAGNOSIS — R600 Localized edema: Secondary | ICD-10-CM

## 2017-09-27 DIAGNOSIS — G4700 Insomnia, unspecified: Secondary | ICD-10-CM

## 2017-09-27 MED ORDER — POTASSIUM CHLORIDE ER 10 MEQ PO TBCR: 10.0000 meq | EXTENDED_RELEASE_TABLET | Freq: Every day | ORAL | 5 refills | Status: AC

## 2017-09-27 MED ORDER — FUROSEMIDE 20 MG PO TABS: 20.0000 mg | ORAL_TABLET | Freq: Every day | ORAL | 5 refills | Status: AC

## 2017-09-27 MED ORDER — POTASSIUM CHLORIDE ER 10 MEQ PO TBCR: 10.0000 meq | EXTENDED_RELEASE_TABLET | Freq: Every day | ORAL | 5 refills | Status: DC

## 2017-09-27 MED ORDER — TRAZODONE HCL 50 MG PO TABS: 25.0000 mg | ORAL_TABLET | Freq: Every day | ORAL | 3 refills | Status: AC

## 2017-09-27 NOTE — Progress Notes (Signed)
Theresa Hampton is a 84 y.o. female with the following history as recorded in EpicCare:  Patient Active Problem List   Diagnosis Date Noted  . OAB (overactive bladder) 06/14/2017  . Dysarthria, post-stroke   . Sleep disturbance   . Orthostatic hypotension   . Right pontine stroke ([place]) 05/09/2017  . Tenosynovitis   . Benign essential HTN   . Chronic kidney disease   . Acute blood loss anemia   . Left wrist pain   . Dysphagia, post-stroke   . Cognitive deficit, post-stroke   . Acute left-sided weakness   . Facial droop 05/06/2017  . Stroke (cerebrum) ([place]) 05/06/2017  . Fatigue 07/29/2015  . Acute sinusitis 03/10/2015  . OA (osteoarthritis) of hip 01/06/2015  . Preop exam for internal medicine 12/16/2014  . HTN (hypertension) 06/09/2014  . Grief 06/09/2014  . Hypokalemia 06/05/2014  . Menopausal disorder 04/07/2014  . Acute pyelonephritis 04/09/2013  . Generalized anxiety disorder 04/09/2013  . Nausea alone 04/07/2013  . Tachycardia 04/07/2013  . Abdominal bloating 04/07/2013  . Neuropathic pain 11/20/2012  . URI, acute 08/21/2012  . Hip pain, left 11/17/2011  . Wrist pain, right 10/27/2010  . Weakness of both legs 10/27/2010  . Cramp of limb 07/09/2009  . B12 deficiency 09/16/2008  . EYE PAIN 02/07/2008  . Sinusitis, chronic 02/07/2008  . Allergic rhinitis 08/06/2007  . Left carotid bruit 08/06/2007  . Vitamin D deficiency 04/26/2007  . Peptic ulcer 04/26/2007  . Osteoarthritis 04/26/2007  . Headache 04/26/2007  . Hyperlipidemia 01/11/2007  . ANEMIA-IRON DEFICIENCY 01/11/2007  . GASTRIC ULCER, ACUTE, HEMORRHAGE 01/11/2007  . DUODENITIS 01/11/2007    Current Outpatient Medications  Medication Sig Dispense Refill  . acetaminophen (TYLENOL) 325 MG tablet Take 2 tablets (650 mg total) by mouth every 6 (six) hours as needed.    Marland Kitchen atorvastatin (LIPITOR) 40 MG tablet Take 1 tablet (40 mg total) by mouth daily at 6 PM. 30 tablet 0  . cholecalciferol (VITAMIN D) 400  units TABS tablet Take 400 Units by mouth.    . CLONAZEPAM PO Take 0.25 mg by mouth as needed.    . clopidogrel (PLAVIX) 75 MG tablet Take 1 tablet (75 mg total) by mouth daily. 30 tablet 0  . diclofenac sodium (VOLTAREN) 1 % GEL Apply 2 g topically 4 (four) times daily. 4 Tube 0  . fluticasone (FLONASE) 50 MCG/ACT nasal spray Place into both nostrils daily.    . furosemide (LASIX) 20 MG tablet Take 1 tablet (20 mg total) by mouth daily. 30 tablet 5  . gabapentin (NEURONTIN) 100 MG capsule TAKE 1 CAPSULE BY MOUTH AT BEDTIME 30 capsule 1  . loratadine (CLARITIN) 10 MG tablet Take 1 tablet (10 mg total) by mouth daily. 100 tablet 2  . LUMIGAN 0.01 % SOLN instill 1 drop IN [facility] EYE AT BEDTIME 7.5 mL 0  . Menthol-Methyl Salicylate (MUSCLE RUB) 10-15 % CREA Apply 1 application topically 2 (two) times daily as needed for muscle pain (BACK PAIN).  0  . mirtazapine (REMERON) 15 MG tablet TAKE ONE TABLET BY MOUTH EVERY EVENING BEFORE DINNER AT 4 - 5 PM 30 tablet 1  . Multiple Vitamins-Iron (MULTIVITAMINS WITH IRON) TABS tablet Take 1 tablet by mouth daily.  0  . potassium chloride (KLOR-CON 10) 10 MEQ tablet Take 1 tablet (10 mEq total) by mouth daily. 30 tablet 5  . senna-docusate (SENOKOT-S) 8.6-50 MG tablet Take 1 tablet by mouth at bedtime as needed for moderate constipation. 30 tablet 0  .  tolterodine (DETROL LA) 4 MG 24 hr capsule Take 1 capsule (4 mg total) by mouth daily. 30 capsule 11  . traMADol (ULTRAM) 50 MG tablet Take 0.5 tablets (25 mg total) by mouth every 12 (twelve) hours as needed for severe pain. 60 tablet 5  . traZODone (DESYREL) 50 MG tablet Take 0.5 tablets (25 mg total) by mouth at bedtime. 15 tablet 3  . vitamin B-12 (CYANOCOBALAMIN) 100 MCG tablet Take 100 mcg by mouth daily.     No current facility-administered medications for this visit.     Allergies: Lidocaine hcl and Amlodipine  Past Medical History:  Diagnosis Date  . Allergic rhinitis   . Arthritis    "hips"  (05/08/2017)  . B12 deficiency anemia   . Chronic sinusitis   . Daily headache    "q am" (05/08/2017)  . History of transfusion 2004   "w/1st hip OR" (05/08/2017)  . Hyperlipidemia   . Hypertension   . Iron deficiency anemia   . Leg cramps   . Osteoarthritis    Dr. Maureen Ralphs  . Osteoporosis   . Peptic ulcer disease 2003  . PONV (postoperative nausea and vomiting)   . Pyelonephritis, acute 2014  . Seasonal allergies   . Seizures (HCC)    x1 with Lidocaine  . Stroke ([place]) 05/05/2017  . Vitamin B12 deficiency   . Vitamin D deficiency     Past Surgical History:  Procedure Laterality Date  . CATARACT EXTRACTION, BILATERAL Bilateral   . DILATION AND CURETTAGE OF UTERUS    . ENDOMETRIAL ABLATION  2008  . JOINT REPLACEMENT    . THYROIDECTOMY, PARTIAL  "many years ago"  . TOTAL HIP ARTHROPLASTY Left 01/06/2015   Procedure: LEFT TOTAL HIP ARTHROPLASTY ANTERIOR APPROACH;  Surgeon: Gaynelle Arabian, MD;  Location: [facility];  Service: Orthopedics;  Laterality: Left;  . TOTAL HIP ARTHROPLASTY Right 2004    Family History  Problem Relation Age of Onset  . Arthritis Mother   . Hypertension Unknown   . Cancer Sister     Social History   Tobacco Use  . Smoking status: Never Smoker  . Smokeless tobacco: Never Used  Substance Use Topics  . Alcohol use: No    Subjective:  Patient is brought to the office by her daughter today; appointment was originally made with concerns about 3 week history of swelling in her ankles (bilateral); her PCP was contacted with these concerns and actually called her this morning with recommended changes to treat the symptoms; her Amlodipine was stopped and she has been given Lasix/ Potassium; patient denies any chest pain or shortness of breath; she is recovering from a stroke and notes that her symptoms seem to have worsened once her PT/ OT was stopped; she readily admits she is not walking and moving as much as she was when doing therapy; no changes in urinary symptoms  either;   Patient is also requesting that the dosage of her Trazodone be changed to every night as opposed to prn; new order needs to be written for her assisted living facility;   Objective:  Vitals:   09/27/17 1323  BP: 122/74  Pulse: 82  Temp: 98.2 F (36.8 C)  TempSrc: Oral  SpO2: 97%  Weight: 130 lb 1.9 oz (59 kg)  Height: 5\' 5"  (1.651 m)    General: Well developed, well nourished, in no acute distress  Skin : Warm and dry.  Head: Normocephalic and atraumatic  Eyes: Sclera and conjunctiva clear; pupils round and reactive to  light; extraocular movements intact  Lungs: Respirations unlabored; clear to auscultation bilaterally without wheeze, rales, rhonchi  CVS exam: normal rate and regular rhythm.  Extremities: bilateral pedal edema- not pitting, cyanosis, clubbing  Vessels: Symmetric bilaterally  Neurologic: Alert and oriented; speech intact; face symmetrical; moves all extremities well; CNII-XII intact without focal deficit   Assessment:  1. Pedal edema   2. Insomnia, unspecified type     Plan:  1. Medication adjustments have already been made by patient's PCP; her amlodipine was discontinued prior to today's appointment; Lasix and potassium were called in for her; she will follow-up with her PCP as needed if symptoms persist.  2. Change Rx for Trazodone to nightly as opposed to prn;   No follow-ups on file.  No orders of the defined types were placed in this encounter.   Requested Prescriptions   Signed Prescriptions Disp Refills  . traZODone (DESYREL) 50 MG tablet 15 tablet 3    Sig: Take 0.5 tablets (25 mg total) by mouth at bedtime.

## 2017-09-27 NOTE — Telephone Encounter (Signed)
RX sent

## 2017-09-27 NOTE — Telephone Encounter (Signed)
Daughter notified, pt has appt today

## 2017-09-27 NOTE — Telephone Encounter (Signed)
Copied from [place]. Topic: Quick Communication - See Telephone Encounter >> Sep 27, 2017  3:29 PM Conception Chancy, NT wrote: CRM for notification. See Telephone encounter for: 09/27/17.  Patient daughter is calling and starts [place] has not received potassium chloride (KLOR-CON 10) 10 MEQ tablet. Please advise.   [place], [address] [place] [address] Phone: [phone] Fax: [phone]

## 2017-09-27 NOTE — Addendum Note (Signed)
Addended by: Cassandria Anger on: 09/27/2017 12:03 AM   Modules accepted: Orders

## 2017-09-27 NOTE — Telephone Encounter (Signed)
Please start to take furosemide and potassium.  Prescriptions were emailed.  Discontinue amlodipine.  Please see me if not better Thank you

## 2017-10-26 ENCOUNTER — Ambulatory Visit (INDEPENDENT_AMBULATORY_CARE_PROVIDER_SITE_OTHER): Payer: Medicare Other | Admitting: Internal Medicine

## 2017-10-26 ENCOUNTER — Encounter: Payer: Self-pay | Admitting: Internal Medicine

## 2017-10-26 ENCOUNTER — Other Ambulatory Visit (INDEPENDENT_AMBULATORY_CARE_PROVIDER_SITE_OTHER): Payer: Medicare Other

## 2017-10-26 VITALS — BP 160/62 | HR 84 | Ht 65.0 in | Wt 132.0 lb

## 2017-10-26 DIAGNOSIS — I6350 Cerebral infarction due to unspecified occlusion or stenosis of unspecified cerebral artery: Secondary | ICD-10-CM | POA: Diagnosis not present

## 2017-10-26 DIAGNOSIS — R600 Localized edema: Secondary | ICD-10-CM | POA: Diagnosis not present

## 2017-10-26 DIAGNOSIS — R29898 Other symptoms and signs involving the musculoskeletal system: Secondary | ICD-10-CM

## 2017-10-26 DIAGNOSIS — E559 Vitamin D deficiency, unspecified: Secondary | ICD-10-CM

## 2017-10-26 DIAGNOSIS — D62 Acute posthemorrhagic anemia: Secondary | ICD-10-CM

## 2017-10-26 DIAGNOSIS — E538 Deficiency of other specified B group vitamins: Secondary | ICD-10-CM | POA: Diagnosis not present

## 2017-10-26 DIAGNOSIS — I10 Essential (primary) hypertension: Secondary | ICD-10-CM | POA: Diagnosis not present

## 2017-10-26 DIAGNOSIS — I6302 Cerebral infarction due to thrombosis of basilar artery: Secondary | ICD-10-CM | POA: Diagnosis not present

## 2017-10-26 DIAGNOSIS — R609 Edema, unspecified: Secondary | ICD-10-CM | POA: Insufficient documentation

## 2017-10-26 LAB — CBC WITH DIFFERENTIAL/PLATELET
BASOS PCT: 0.8 % (ref 0.0–3.0)
Basophils Absolute: 0.1 10*3/uL (ref 0.0–0.1)
EOS PCT: 3 % (ref 0.0–5.0)
Eosinophils Absolute: 0.3 10*3/uL (ref 0.0–0.7)
HEMATOCRIT: 35.6 % — AB (ref 36.0–46.0)
Hemoglobin: 12 g/dL (ref 12.0–15.0)
LYMPHS ABS: 1.2 10*3/uL (ref 0.7–4.0)
LYMPHS PCT: 12.9 % (ref 12.0–46.0)
MCHC: 33.9 g/dL (ref 30.0–36.0)
MCV: 84.8 fl (ref 78.0–100.0)
MONOS PCT: 10.4 % (ref 3.0–12.0)
Monocytes Absolute: 0.9 10*3/uL (ref 0.1–1.0)
NEUTROS ABS: 6.5 10*3/uL (ref 1.4–7.7)
NEUTROS PCT: 72.9 % (ref 43.0–77.0)
PLATELETS: 265 10*3/uL (ref 150.0–400.0)
RBC: 4.19 Mil/uL (ref 3.87–5.11)
RDW: 13 % (ref 11.5–15.5)
WBC: 8.9 10*3/uL (ref 4.0–10.5)

## 2017-10-26 LAB — IRON,TIBC AND FERRITIN PANEL
%SAT: 26 % (calc) (ref 16–45)
Ferritin: 42 ng/mL (ref 16–288)
Iron: 83 ug/dL (ref 45–160)
TIBC: 322 mcg/dL (calc) (ref 250–450)

## 2017-10-26 LAB — BASIC METABOLIC PANEL
BUN: 16 mg/dL (ref 6–23)
CALCIUM: 9.4 mg/dL (ref 8.4–10.5)
CHLORIDE: 101 meq/L (ref 96–112)
CO2: 29 mEq/L (ref 19–32)
CREATININE: 1.12 mg/dL (ref 0.40–1.20)
GFR: 47.99 mL/min — ABNORMAL LOW (ref >60.00)
Glucose, Bld: 108 mg/dL — ABNORMAL HIGH (ref 70–99)
Potassium: 3.8 mEq/L (ref 3.5–5.1)
Sodium: 139 mEq/L (ref 135–145)

## 2017-10-26 LAB — TSH: TSH: 2.38 u[IU]/mL (ref 0.35–4.50)

## 2017-10-26 NOTE — Assessment & Plan Note (Signed)
Using a walker, w/c

## 2017-10-26 NOTE — Assessment & Plan Note (Signed)
Vit D 

## 2017-10-26 NOTE — Progress Notes (Signed)
Subjective:  Patient ID: Theresa Hampton, female    DOB: 12/12/1919  Age: 84 y.o. MRN: [id number]  CC: No chief complaint on file.   HPI Theresa Hampton presents for dyslipidemia, LE edema, OA f/u  Outpatient Medications Prior to Visit  Medication Sig Dispense Refill  . acetaminophen (TYLENOL) 325 MG tablet Take 2 tablets (650 mg total) by mouth every 6 (six) hours as needed.    Marland Kitchen atorvastatin (LIPITOR) 40 MG tablet Take 1 tablet (40 mg total) by mouth daily at 6 PM. 30 tablet 0  . cholecalciferol (VITAMIN D) 400 units TABS tablet Take 400 Units by mouth.    . CLONAZEPAM PO Take 0.25 mg by mouth as needed.    . clopidogrel (PLAVIX) 75 MG tablet Take 1 tablet (75 mg total) by mouth daily. 30 tablet 0  . diclofenac sodium (VOLTAREN) 1 % GEL Apply 2 g topically 4 (four) times daily. 4 Tube 0  . fluticasone (FLONASE) 50 MCG/ACT nasal spray Place into both nostrils daily.    . furosemide (LASIX) 20 MG tablet Take 1 tablet (20 mg total) by mouth daily. 30 tablet 5  . gabapentin (NEURONTIN) 100 MG capsule TAKE 1 CAPSULE BY MOUTH AT BEDTIME 30 capsule 1  . loratadine (CLARITIN) 10 MG tablet Take 1 tablet (10 mg total) by mouth daily. 100 tablet 2  . LUMIGAN 0.01 % SOLN instill 1 drop IN [facility] EYE AT BEDTIME 7.5 mL 0  . Menthol-Methyl Salicylate (MUSCLE RUB) 10-15 % CREA Apply 1 application topically 2 (two) times daily as needed for muscle pain (BACK PAIN).  0  . mirtazapine (REMERON) 15 MG tablet TAKE ONE TABLET BY MOUTH EVERY EVENING BEFORE DINNER AT 4 - 5 PM 30 tablet 1  . Multiple Vitamins-Iron (MULTIVITAMINS WITH IRON) TABS tablet Take 1 tablet by mouth daily.  0  . potassium chloride (KLOR-CON 10) 10 MEQ tablet Take 1 tablet (10 mEq total) by mouth daily. 30 tablet 5  . senna-docusate (SENOKOT-S) 8.6-50 MG tablet Take 1 tablet by mouth at bedtime as needed for moderate constipation. 30 tablet 0  . tolterodine (DETROL LA) 4 MG 24 hr capsule Take 1 capsule (4 mg total) by mouth daily. 30  capsule 11  . traMADol (ULTRAM) 50 MG tablet Take 0.5 tablets (25 mg total) by mouth every 12 (twelve) hours as needed for severe pain. 60 tablet 5  . traZODone (DESYREL) 50 MG tablet Take 0.5 tablets (25 mg total) by mouth at bedtime. 15 tablet 3  . vitamin B-12 (CYANOCOBALAMIN) 100 MCG tablet Take 100 mcg by mouth daily.     No facility-administered medications prior to visit.     ROS: Review of Systems  Constitutional: Negative for activity change, appetite change, chills, fatigue and unexpected weight change.  HENT: Negative for congestion, mouth sores and sinus pressure.   Eyes: Negative for visual disturbance.  Respiratory: Negative for cough and chest tightness.   Gastrointestinal: Negative for abdominal pain and nausea.  Genitourinary: Negative for difficulty urinating, frequency and vaginal pain.  Musculoskeletal: Positive for gait problem. Negative for back pain.  Skin: Negative for pallor and rash.  Neurological: Positive for weakness. Negative for dizziness, tremors, numbness and headaches.  Psychiatric/Behavioral: Negative for confusion and sleep disturbance.    Objective:  BP (!) 160/62 (BP Location: Left Arm, Patient Position: Sitting, Cuff Size: Normal)   Pulse 84   Ht 5\' 5"  (1.651 m)   Wt 132 lb (59.9 kg)   LMP  (LMP Unknown)  SpO2 96%   BMI 21.97 kg/m   BP Readings from Last 3 Encounters:  10/26/17 (!) 160/62  09/27/17 122/74  08/17/17 126/62    Wt Readings from Last 3 Encounters:  10/26/17 132 lb (59.9 kg)  09/27/17 130 lb 1.9 oz (59 kg)  08/17/17 126 lb (57.2 kg)    Physical Exam  Constitutional: She appears well-developed. No distress.  HENT:  Head: Normocephalic.  Right Ear: External ear normal.  Left Ear: External ear normal.  Nose: Nose normal.  Mouth/Throat: Oropharynx is clear and moist.  Eyes: Pupils are equal, round, and reactive to light. Conjunctivae are normal. Right eye exhibits no discharge. Left eye exhibits no discharge.  Neck:  Normal range of motion. Neck supple. No JVD present. No tracheal deviation present. No thyromegaly present.  Cardiovascular: Normal rate, regular rhythm and normal heart sounds.  Pulmonary/Chest: No stridor. No respiratory distress. She has no wheezes.  Abdominal: Soft. Bowel sounds are normal. She exhibits no distension and no mass. There is no tenderness. There is no rebound and no guarding.  Musculoskeletal: She exhibits no edema or tenderness.  Lymphadenopathy:    She has no cervical adenopathy.  Neurological: She displays normal reflexes. No cranial nerve deficit. She exhibits normal muscle tone. Coordination abnormal.  Skin: No rash noted. No erythema.  Psychiatric: She has a normal mood and affect. Her behavior is normal. Judgment and thought content normal.  in a w/c  Lab Results  Component Value Date   WBC 7.4 05/22/2017   HGB 10.9 (L) 05/22/2017   HCT 34.5 (L) 05/22/2017   PLT 364 05/22/2017   GLUCOSE 102 (H) 05/22/2017   CHOL 217 (H) 05/07/2017   TRIG 94 05/07/2017   HDL 65 05/07/2017   LDLDIRECT 125.4 05/10/2011   LDLCALC 133 (H) 05/07/2017   ALT 16 05/10/2017   AST 25 05/10/2017   NA 138 05/22/2017   K 4.2 05/22/2017   CL 104 05/22/2017   CREATININE 0.74 05/22/2017   BUN 10 05/22/2017   CO2 26 05/22/2017   TSH 3.83 08/01/2016   INR 1.01 05/06/2017   HGBA1C 5.3 05/07/2017    No results found.  Assessment & Plan:   There are no diagnoses linked to this encounter.   No orders of the defined types were placed in this encounter.    Follow-up: No follow-ups on file.  Walker Kehr, MD

## 2017-10-26 NOTE — Assessment & Plan Note (Signed)
Lopressor Norvasc 

## 2017-10-26 NOTE — Assessment & Plan Note (Signed)
Resolved w/diuretics

## 2017-10-26 NOTE — Assessment & Plan Note (Signed)
On B12 

## 2017-11-12 ENCOUNTER — Ambulatory Visit (INDEPENDENT_AMBULATORY_CARE_PROVIDER_SITE_OTHER): Payer: Medicare Other | Admitting: Adult Health

## 2017-11-12 ENCOUNTER — Encounter: Payer: Self-pay | Admitting: Adult Health

## 2017-11-12 VITALS — BP 116/62 | HR 79 | Ht 65.0 in | Wt 133.0 lb

## 2017-11-12 DIAGNOSIS — E785 Hyperlipidemia, unspecified: Secondary | ICD-10-CM | POA: Diagnosis not present

## 2017-11-12 DIAGNOSIS — I10 Essential (primary) hypertension: Secondary | ICD-10-CM

## 2017-11-12 DIAGNOSIS — I6350 Cerebral infarction due to unspecified occlusion or stenosis of unspecified cerebral artery: Secondary | ICD-10-CM

## 2017-11-12 NOTE — Progress Notes (Signed)
I agree with the above plan 

## 2017-11-12 NOTE — Progress Notes (Signed)
[facility] [address] [place]. [place] [address] [phone]       OFFICE FOLLOW UP NOTE  [name] Date of Birth:  [birth year] Medical Record Number:  [id number]   Reason for Referral:  Hospital stroke follow up  CHIEF COMPLAINT:  Chief Complaint  Patient presents with  . Follow-up    Stroke follow up pt in wheelchair with daughter Lelon Frohlich pt is at  [facility]    HPI: Theresa Hampton is being seen today in the office for acute/subacute nonhemorrhagic linear infarct right paramedian pons on 05/06/17. History obtained from patient and chart review. Reviewed all radiology images and labs personally.  Theresa Hampton is a 84 y.o. female with PMH of hypertension hyperlipidemia peptic ulcer disease vitamin D and B12 deficiency with 2 days worth of left facial droop and falls. On examination she had left facial droop, mild dysarthria and mild left hemiparesis.  CT head reviewed and negative for acute intracranial abnormality.  MRI reviewed and showed acute/subacute nonhemorrhagic linear infarct involving the right paramedian pons which does correspond with the patient's left-sided weakness and likely etiology small vessel disease.  MRA of the head and neck is negative for showed mild stenosis in the right PCA, origin of the carotid artery bilaterally and moderate stenosis in the left superior cerebral artery and the origin of the right vertebral artery.  The basilar artery was widely patent.  2D echo showed an EF of 55-60%.  Patient was taking aspirin 81 mg PTA but was switched to Plavix 75 mg daily at discharge.  LDL 133 and a started on Lipitor 40 mg daily. A1c 5.3.  Patient was accepted to [facility] for continued therapies.  Patient was discharged to [facility] (assisted living facility) in stable condition.  Since discharge, patient has been doing well.  She is accompanied today by her daughter.  Patient continues to take Plavix without side effects  of increased bleeding or bruising.  Patient continues to take Lipitor without side effects of myalgias.  Patient continues to work with PT OT [facility], assisted living facility.  Patient's blood pressure has been satisfactory and at today's visit 120/63.  Patient feels as though most of her symptoms have improved.  Denies memory complaints.  Patient does have complaints of urination and urgency.  Denies pain, hematuria, or fevers.  This has been occurring for proximally 1 week.  Patient was advised to follow-up with PCP regarding this issue who can obtain cultures and treat if necessary.  Denies new or worsening stroke/TIA symptoms.  11/12/17 UPDATE: Patient returns today for 33-month follow-up and is accompanied by her daughter.  Overall she is doing well.  She continues to take Plavix without side effects of bleeding or bruising.  Continues to take Lipitor without side effects myalgias.  Blood pressure satisfactory 116/62.  States that she no longer has left-sided weakness and continues to do exercises at [facility] where she is currently residing.  She has completed all previous therapies but does continue to use rolling walker for short ambulation with assistance and wheelchair for long distance.  Patient does have concerns of increased lower extremity deconditioning due to infrequency of being able to ambulate due to needing assistance.  Patient and daughter both wondering if patient can be released to ambulate independently.  Denies new or worsening stroke/TIA symptoms.   ROS:   14 system review of systems performed and negative with exception of walking difficulty  PMH:  Past Medical  History:  Diagnosis Date  . Allergic rhinitis   . Arthritis    "hips" (05/08/2017)  . B12 deficiency anemia   . Chronic sinusitis   . Daily headache    "q am" (05/08/2017)  . History of transfusion 2004   "w/1st hip OR" (05/08/2017)  . Hyperlipidemia   . Hypertension   . Iron deficiency anemia   . Leg cramps     . Osteoarthritis    Dr. Maureen Ralphs  . Osteoporosis   . Peptic ulcer disease 2003  . PONV (postoperative nausea and vomiting)   . Pyelonephritis, acute 2014  . Seasonal allergies   . Seizures (HCC)    x1 with Lidocaine  . Stroke ([facility]) 05/05/2017  . Vitamin B12 deficiency   . Vitamin D deficiency     PSH:  Past Surgical History:  Procedure Laterality Date  . CATARACT EXTRACTION, BILATERAL Bilateral   . DILATION AND CURETTAGE OF UTERUS    . ENDOMETRIAL ABLATION  2008  . JOINT REPLACEMENT    . THYROIDECTOMY, PARTIAL  "many years ago"  . TOTAL HIP ARTHROPLASTY Left 01/06/2015   Procedure: LEFT TOTAL HIP ARTHROPLASTY ANTERIOR APPROACH;  Surgeon: Gaynelle Arabian, MD;  Location: [facility];  Service: Orthopedics;  Laterality: Left;  . TOTAL HIP ARTHROPLASTY Right 2004    Social History:  Social History   Socioeconomic History  . Marital status: Widowed    Spouse name: Not on file  . Number of children: Not on file  . Years of education: Not on file  . Highest education level: Not on file  Occupational History  . Occupation: retired  Scientific laboratory technician  . Financial resource strain: Not on file  . Food insecurity:    Worry: Not on file    Inability: Not on file  . Transportation needs:    Medical: Not on file    Non-medical: Not on file  Tobacco Use  . Smoking status: Never Smoker  . Smokeless tobacco: Never Used  Substance and Sexual Activity  . Alcohol use: No  . Drug use: No  . Sexual activity: Not Currently  Lifestyle  . Physical activity:    Days per week: Not on file    Minutes per session: Not on file  . Stress: Not on file  Relationships  . Social connections:    Talks on phone: Not on file    Gets together: Not on file    Attends religious service: Not on file    Active member of club or organization: Not on file    Attends meetings of clubs or organizations: Not on file    Relationship status: Not on file  . Intimate partner violence:    Fear of current or ex  partner: Not on file    Emotionally abused: Not on file    Physically abused: Not on file    Forced sexual activity: Not on file  Other Topics Concern  . Not on file  Social History Narrative  . Not on file    Family History:  Family History  Problem Relation Age of Onset  . Arthritis Mother   . Hypertension Unknown   . Cancer Sister     Medications:   Current Outpatient Medications on File Prior to Visit  Medication Sig Dispense Refill  . acetaminophen (TYLENOL) 325 MG tablet Take 2 tablets (650 mg total) by mouth every 6 (six) hours as needed.    Marland Kitchen atorvastatin (LIPITOR) 40 MG tablet Take 1 tablet (40 mg total) by mouth  daily at 6 PM. 30 tablet 0  . cholecalciferol (VITAMIN D) 400 units TABS tablet Take 400 Units by mouth.    . CLONAZEPAM PO Take 0.25 mg by mouth as needed.    . clopidogrel (PLAVIX) 75 MG tablet Take 1 tablet (75 mg total) by mouth daily. 30 tablet 0  . diclofenac sodium (VOLTAREN) 1 % GEL Apply 2 g topically 4 (four) times daily. 4 Tube 0  . fluticasone (FLONASE) 50 MCG/ACT nasal spray Place into both nostrils daily.    . furosemide (LASIX) 20 MG tablet Take 1 tablet (20 mg total) by mouth daily. 30 tablet 5  . gabapentin (NEURONTIN) 100 MG capsule TAKE 1 CAPSULE BY MOUTH AT BEDTIME 30 capsule 1  . loratadine (CLARITIN) 10 MG tablet Take 1 tablet (10 mg total) by mouth daily. 100 tablet 2  . LUMIGAN 0.01 % SOLN instill 1 drop IN [facility] EYE AT BEDTIME 7.5 mL 0  . Menthol-Methyl Salicylate (MUSCLE RUB) 10-15 % CREA Apply 1 application topically 2 (two) times daily as needed for muscle pain (BACK PAIN).  0  . mirtazapine (REMERON) 15 MG tablet TAKE ONE TABLET BY MOUTH EVERY EVENING BEFORE DINNER AT 4 - 5 PM 30 tablet 1  . Multiple Vitamins-Iron (MULTIVITAMINS WITH IRON) TABS tablet Take 1 tablet by mouth daily.  0  . potassium chloride (KLOR-CON 10) 10 MEQ tablet Take 1 tablet (10 mEq total) by mouth daily. 30 tablet 5  . senna-docusate (SENOKOT-S) 8.6-50 MG tablet  Take 1 tablet by mouth at bedtime as needed for moderate constipation. 30 tablet 0  . tolterodine (DETROL LA) 4 MG 24 hr capsule Take 1 capsule (4 mg total) by mouth daily. 30 capsule 11  . traMADol (ULTRAM) 50 MG tablet Take 0.5 tablets (25 mg total) by mouth every 12 (twelve) hours as needed for severe pain. 60 tablet 5  . traZODone (DESYREL) 50 MG tablet Take 0.5 tablets (25 mg total) by mouth at bedtime. 15 tablet 3  . vitamin B-12 (CYANOCOBALAMIN) 100 MCG tablet Take 100 mcg by mouth daily.     No current facility-administered medications on file prior to visit.     Allergies:   Allergies  Allergen Reactions  . Lidocaine Hcl     seizure  . Amlodipine     Leg swelling even with a low dose     Physical Exam  Vitals:   11/12/17 1004  BP: 116/62  Pulse: 79  Weight: 133 lb (60.3 kg)  Height: 5\' 5"  (1.651 m)   Body mass index is 22.13 kg/m. No exam data present  General: well developed, frail pleasant elderly Caucasian female, well nourished, seated, in no evident distress Head: head normocephalic and atraumatic.   Neck: supple with no carotid or supraclavicular bruits Cardiovascular: regular rate and rhythm, no murmurs Musculoskeletal: no deformity Skin:  no rash/petichiae Vascular:  Normal pulses all extremities  Neurologic Exam Mental Status: Awake and fully alert.  Mild dysarthria.  Oriented to place and time. Remote memory intact.    Attention span, concentration and fund of knowledge appropriate. Mood and affect appropriate.  Cranial Nerves: Fundoscopic exam not tested.  Pupils equal, briskly reactive to light. Extraocular movements full without nystagmus. Visual fields full to confrontation. Hearing intact. Facial sensation intact.  Mild left facial asymmetry. Motor: Normal bulk and tone.  Normal strength in all tested extremities Sensory.: intact to touch , pinprick , position and vibratory sensation.  Coordination: Left hand decreased dexterity.  Right arm orbits  around  left arm.  Right arm does orbit over left arm.  Decreased finger dexterity in right hand Gait and Station: Patient currently sitting in wheelchair.  Does states she ambulates short distances with rolling walker with assistance -rolling walker not present at today's appointment therefore ambulation was not assessed Reflexes: 1+ and symmetric. Toes downgoing.    NIHSS 0  Modified Rankin 3    Diagnostic Data (Labs, Imaging, Testing)  Dg Sacrum/coccyx Result Date: 05/06/2017 IMPRESSION: Distal sacral fracture of uncertain age.   Mr Brain Wo Contrast Result Date: 05/06/2017 IMPRESSION: 1. Acute/subacute nonhemorrhagic linear infarct involving the right paramedian pons, corresponding with the patient's left-sided weakness. 2. No acute supratentorial infarct. 3. Advanced atrophy and diffuse white matter disease likely reflects the sequela of chronic microvascular ischemia. 4. Moderate paranasal sinus disease, predominantly involving the ethmoids.   Mr [facility] and Neck Wo Contrast Result Date: 05/06/2017 IMPRESSION: Mild stenosis at the origin of the carotid artery bilaterally Moderate stenosis at the origin of the right vertebral artery. Origin of left vertebral not included on the study. Moderate stenosis left superior cerebellar artery. Mild stenosis right posterior cerebral artery. Basilar artery widely patent.   Ct Head, Maxillofacial and Cervical Wo Contrast Result Date: 05/06/2017 IMPRESSION:  Head CT: CT negative for acute intracranial abnormality. Evidence of chronic microvascular ischemic disease.  Maxillofacial CT: No acute facial fracture. Soft tissue swelling in the left supraorbital scalp without underlying fracture. No radiopaque foreign body. Moderate paranasal sinus disease predominantly involving the ethmoid air cells.  Cervical CT: CT negative for acute fracture malalignment of the cervical spine. Greatest degree of disc disease present at C5-C6 with associated  facet disease resulting in mild a moderate left-sided foraminal narrowing. Trace anterolisthesis of C3 on C4 and C4 on C5, likely degenerative. Carotid calcifications.   Echocardiogram:                                               Study Conclusions - Left ventricle: The cavity size was normal. There was mild concentric hypertrophy. Systolic function was normal. The estimated ejection fraction was in the range of 55% to 60%. Wall motion was normal; there were no regional wall motion abnormalities. Doppler parameters are consistent with abnormal left ventricular relaxation (grade 1 diastolic dysfunction). There was no evidence of elevated ventricular filling pressure by Doppler parameters. - Aortic valve: Trileaflet; moderately thickened, severely calcified leaflets. There was mild stenosis. Peak gradient (S): 12 mm Hg. Valve area (Vmax): 1.16 cm^2. - Aortic root: The aortic root was normal in size. - Mitral valve: There was mild regurgitation. - Right ventricle: The cavity size was normal. Wall thickness was normal. Systolic function was normal. - Tricuspid valve: There was moderate regurgitation. - Pulmonic valve: There was trivial regurgitation. - Pulmonary arteries: Systolic pressure was moderately increased. PA peak pressure: 43 mm Hg (S). - Inferior vena cava: The vessel was normal in size. - Pericardium, extracardiac: There was no pericardial effusion.     ASSESSMENT: Theresa Hampton is a 84 y.o. year old female here with acute/subacute infarct in the right paramedian pons on 05/06/2017 secondary to small vessel disease.  Vascular risk factors include hypertension and hyperlipidemia.  Patient returns today for follow-up visit and overall is doing well.    PLAN: -Continue clopidogrel 75 mg daily  and lipitor 40mg   for secondary stroke prevention -Explained to patient and  daughter that I am unable to clear patient to ambulate independently as it all  depends on environment at facility and will need the official release from nurses or therapist at facility -patient does state that there are multiple areas that she can sit and rest if needed but feels as though she is deconditioning due to being unable to ambulate frequently due to lack of help at facility -Follow up with PCP regarding HTN and HLD -Continue participation in exercise programs at facility and eating healthy Maintain strict control of hypertension with blood pressure goal below 130/90, diabetes with hemoglobin A1c goal below 6.5% and cholesterol with LDL cholesterol (bad cholesterol) goal below 70 mg/dL. I also advised the patient to eat a healthy diet with plenty of whole grains, cereals, fruits and vegetables, exercise regularly and maintain ideal body weight.  Followup in the future with me in 6 months or call earlier if needed   Greater than 50% of time during this 25  minute visit was spent on counseling,explanation of diagnosis, planning of further management, discussion with patient and family and coordination of care.     Venancio Poisson, AGNP-BC  [facility] [address] [place], [place] [address]  Phone [phone] Fax [phone]

## 2017-11-12 NOTE — Patient Instructions (Signed)
Continue clopidogrel 75 mg daily  and lipitor  for secondary stroke prevention  Continue to follow up with PCP regarding cholesterol and blood pressure management   Continue to monitor blood pressure at home  Maintain strict control of hypertension with blood pressure goal below 130/90, diabetes with hemoglobin A1c goal below 6.5% and cholesterol with LDL cholesterol (bad cholesterol) goal below 70 mg/dL. I also advised the patient to eat a healthy diet with plenty of whole grains, cereals, fruits and vegetables, exercise regularly and maintain ideal body weight.  Followup in the future with me in 6 months or call earlier if needed       Thank you for coming to see [place] at [facility]. I hope we have been able to provide you high quality care today.  You may receive a patient satisfaction survey over the next few weeks. We would appreciate your feedback and comments so that we may continue to improve ourselves and the health of our patients.

## 2017-11-16 ENCOUNTER — Encounter: Payer: Medicare Other | Attending: Physical Medicine & Rehabilitation

## 2017-11-16 ENCOUNTER — Encounter: Payer: Self-pay | Admitting: Physical Medicine & Rehabilitation

## 2017-11-16 ENCOUNTER — Ambulatory Visit (HOSPITAL_BASED_OUTPATIENT_CLINIC_OR_DEPARTMENT_OTHER): Payer: Medicare Other | Admitting: Physical Medicine & Rehabilitation

## 2017-11-16 VITALS — BP 124/71 | HR 79 | Ht 65.0 in | Wt 130.0 lb

## 2017-11-16 DIAGNOSIS — N189 Chronic kidney disease, unspecified: Secondary | ICD-10-CM | POA: Insufficient documentation

## 2017-11-16 DIAGNOSIS — M810 Age-related osteoporosis without current pathological fracture: Secondary | ICD-10-CM | POA: Insufficient documentation

## 2017-11-16 DIAGNOSIS — J309 Allergic rhinitis, unspecified: Secondary | ICD-10-CM | POA: Diagnosis not present

## 2017-11-16 DIAGNOSIS — Z9181 History of falling: Secondary | ICD-10-CM | POA: Diagnosis not present

## 2017-11-16 DIAGNOSIS — Z9889 Other specified postprocedural states: Secondary | ICD-10-CM | POA: Diagnosis not present

## 2017-11-16 DIAGNOSIS — I129 Hypertensive chronic kidney disease with stage 1 through stage 4 chronic kidney disease, or unspecified chronic kidney disease: Secondary | ICD-10-CM | POA: Diagnosis not present

## 2017-11-16 DIAGNOSIS — I6350 Cerebral infarction due to unspecified occlusion or stenosis of unspecified cerebral artery: Secondary | ICD-10-CM

## 2017-11-16 DIAGNOSIS — I69398 Other sequelae of cerebral infarction: Secondary | ICD-10-CM

## 2017-11-16 DIAGNOSIS — Z809 Family history of malignant neoplasm, unspecified: Secondary | ICD-10-CM | POA: Insufficient documentation

## 2017-11-16 DIAGNOSIS — M1990 Unspecified osteoarthritis, unspecified site: Secondary | ICD-10-CM | POA: Diagnosis not present

## 2017-11-16 DIAGNOSIS — Z8261 Family history of arthritis: Secondary | ICD-10-CM | POA: Diagnosis not present

## 2017-11-16 DIAGNOSIS — R269 Unspecified abnormalities of gait and mobility: Secondary | ICD-10-CM | POA: Diagnosis not present

## 2017-11-16 DIAGNOSIS — I69313 Psychomotor deficit following cerebral infarction: Secondary | ICD-10-CM | POA: Diagnosis not present

## 2017-11-16 DIAGNOSIS — M25532 Pain in left wrist: Secondary | ICD-10-CM | POA: Diagnosis not present

## 2017-11-16 DIAGNOSIS — Z8249 Family history of ischemic heart disease and other diseases of the circulatory system: Secondary | ICD-10-CM | POA: Diagnosis not present

## 2017-11-16 DIAGNOSIS — R1310 Dysphagia, unspecified: Secondary | ICD-10-CM | POA: Insufficient documentation

## 2017-11-16 DIAGNOSIS — I69391 Dysphagia following cerebral infarction: Secondary | ICD-10-CM | POA: Diagnosis not present

## 2017-11-16 DIAGNOSIS — Z96643 Presence of artificial hip joint, bilateral: Secondary | ICD-10-CM | POA: Diagnosis not present

## 2017-11-16 DIAGNOSIS — R262 Difficulty in walking, not elsewhere classified: Secondary | ICD-10-CM | POA: Diagnosis not present

## 2017-11-16 NOTE — Patient Instructions (Signed)
Bring walker to next visit

## 2017-11-16 NOTE — Progress Notes (Signed)
Subjective:    Patient ID: Theresa Hampton, female    DOB: 08/23/1919, 84 y.o.   MRN: [id number] 84 y.o.left handed femalewith history of HTN, CKD, OA, B-12 deficiency, one week history of difficulty walking progressing to increased falls.She was admitted on 12/30/18AMafter found by family with left facial droop. MRI brain revealed right pontine stroke with moderate stenosis at origin of R-VA and moderate stenosis L-SCA. Neurology recommended changing ASA to Plavix for stroke likely due to SVD. She had reports of buttock pain as well as left wrist painat admission andX rays of pelvis showed evidence of prior sacral fracture and left wrist films showed moderate degenerative changes with soft tissue swelling distal wrist.Patient with resultant deficits in functional mobility, dysphagia as well as difficulty completing ADLs. HPI Patient living in [facility] assisted living.  Still requiring supervision for bathing is able to dress herself with exception of shoes and socks. She has been restricted from ambulating independently to the dining room with her walker based on safety concerns.  She does not have her walker with her today.  She is no longer receiving outpatient therapy.  In addition she is doing some chair exercises in a group setting at her assisted living. Denies any falls. Accompanied by her daughter. Pain Inventory Average Pain 0 Pain Right Now 0 My pain is na  In the last 24 hours, has pain interfered with the following? General activity 0 Relation with others 0 Enjoyment of life 0 What TIME of day is your pain at its worst? na Sleep (in general) Good  Pain is worse with: na Pain improves with: na Relief from Meds: na  Mobility walk with assistance use a walker ability to climb steps?  no do you drive?  no use a wheelchair transfers alone  Function retired  Neuro/Psych bladder control problems trouble walking  Prior Studies Any changes since last  visit?  no  Physicians involved in your care Any changes since last visit?  no   Family History  Problem Relation Age of Onset  . Arthritis Mother   . Hypertension Unknown   . Cancer Sister    Social History   Socioeconomic History  . Marital status: Widowed    Spouse name: Not on file  . Number of children: Not on file  . Years of education: Not on file  . Highest education level: Not on file  Occupational History  . Occupation: retired  Scientific laboratory technician  . Financial resource strain: Not on file  . Food insecurity:    Worry: Not on file    Inability: Not on file  . Transportation needs:    Medical: Not on file    Non-medical: Not on file  Tobacco Use  . Smoking status: Never Smoker  . Smokeless tobacco: Never Used  Substance and Sexual Activity  . Alcohol use: No  . Drug use: No  . Sexual activity: Not Currently  Lifestyle  . Physical activity:    Days per week: Not on file    Minutes per session: Not on file  . Stress: Not on file  Relationships  . Social connections:    Talks on phone: Not on file    Gets together: Not on file    Attends religious service: Not on file    Active member of club or organization: Not on file    Attends meetings of clubs or organizations: Not on file    Relationship status: Not on file  Other Topics Concern  .  Not on file  Social History Narrative  . Not on file   Past Surgical History:  Procedure Laterality Date  . CATARACT EXTRACTION, BILATERAL Bilateral   . DILATION AND CURETTAGE OF UTERUS    . ENDOMETRIAL ABLATION  2008  . JOINT REPLACEMENT    . THYROIDECTOMY, PARTIAL  "many years ago"  . TOTAL HIP ARTHROPLASTY Left 01/06/2015   Procedure: LEFT TOTAL HIP ARTHROPLASTY ANTERIOR APPROACH;  Surgeon: Gaynelle Arabian, MD;  Location: [facility];  Service: Orthopedics;  Laterality: Left;  . TOTAL HIP ARTHROPLASTY Right 2004   Past Medical History:  Diagnosis Date  . Allergic rhinitis   . Arthritis    "hips" (05/08/2017)  . B12  deficiency anemia   . Chronic sinusitis   . Daily headache    "q am" (05/08/2017)  . History of transfusion 2004   "w/1st hip OR" (05/08/2017)  . Hyperlipidemia   . Hypertension   . Iron deficiency anemia   . Leg cramps   . Osteoarthritis    Dr. Maureen Ralphs  . Osteoporosis   . Peptic ulcer disease 2003  . PONV (postoperative nausea and vomiting)   . Pyelonephritis, acute 2014  . Seasonal allergies   . Seizures (HCC)    x1 with Lidocaine  . Stroke ([place]) 05/05/2017  . Vitamin B12 deficiency   . Vitamin D deficiency    BP 124/71   Pulse 79   Ht 5\' 5"  (1.651 m)   Wt 130 lb (59 kg)   LMP  (LMP Unknown)   SpO2 94%   BMI 21.63 kg/m   Opioid Risk Score:   Fall Risk Score:  `1  Depression screen PHQ 2/9  Depression screen [facility] 2/9 05/23/2016 10/14/2014  Decreased Interest 0 1  Down, Depressed, Hopeless 0 0  PHQ - 2 Score 0 1     Review of Systems  Constitutional: Negative.   HENT: Negative.   Eyes: Negative.   Respiratory: Negative.   Cardiovascular: Negative.   Gastrointestinal: Negative.   Endocrine: Negative.   Genitourinary: Positive for difficulty urinating.  Musculoskeletal: Positive for gait problem.  Skin: Negative.   Allergic/Immunologic: Negative.   Hematological: Negative.   Psychiatric/Behavioral: Negative.   All other systems reviewed and are negative.      Objective:   Physical Exam  Constitutional: She is oriented to person, place, and time. She appears well-developed and well-nourished.  HENT:  Head: Normocephalic and atraumatic.  Eyes: Pupils are equal, round, and reactive to light. EOM are normal.  Neurological: She is alert and oriented to person, place, and time.  Psychiatric: She has a normal mood and affect. Her speech is normal and behavior is normal. Thought content normal. She does not express impulsivity or inappropriate judgment. She exhibits abnormal recent memory.  Mildly impaired recent memory  Nursing note and vitals reviewed. Motor  strength is 5/5 bilateral deltoid bicep tricep grip hip flexion extension ankle dorsi flexion Fine motor intact finger to thumb opposition bilaterally There is no evidence of dysdiadochokinesis with rapid alternating supination pronation of the upper extremities.  Sensation intact bilateral upper and lower limb  Ambulates with hand-held assistance.  Gilford Rile is not here.  She does have a wide-based support she is unsteady with turns.     Assessment & Plan:  1.  Right pontine stroke with gait disorder.  She has no residual weakness.  She ambulates with hand-held assistance.  She does not have her walker with her.  I would like to see her back with a walker  to reassess whether or not she can ambulate independently to the dining room. Discussed with patient and her daughter.  Agree with plan.  I do not think any additional physical therapy needed at this time I think she has plateaued.

## 2017-12-17 ENCOUNTER — Encounter: Payer: Medicare Other | Attending: Physical Medicine & Rehabilitation

## 2017-12-17 ENCOUNTER — Other Ambulatory Visit: Payer: Self-pay

## 2017-12-17 ENCOUNTER — Encounter: Payer: Self-pay | Admitting: Physical Medicine & Rehabilitation

## 2017-12-17 ENCOUNTER — Ambulatory Visit (HOSPITAL_BASED_OUTPATIENT_CLINIC_OR_DEPARTMENT_OTHER): Payer: Medicare Other | Admitting: Physical Medicine & Rehabilitation

## 2017-12-17 VITALS — BP 134/78 | HR 84 | Ht 65.0 in | Wt 131.0 lb

## 2017-12-17 DIAGNOSIS — I129 Hypertensive chronic kidney disease with stage 1 through stage 4 chronic kidney disease, or unspecified chronic kidney disease: Secondary | ICD-10-CM | POA: Diagnosis not present

## 2017-12-17 DIAGNOSIS — Z8249 Family history of ischemic heart disease and other diseases of the circulatory system: Secondary | ICD-10-CM | POA: Insufficient documentation

## 2017-12-17 DIAGNOSIS — I6350 Cerebral infarction due to unspecified occlusion or stenosis of unspecified cerebral artery: Secondary | ICD-10-CM

## 2017-12-17 DIAGNOSIS — I69391 Dysphagia following cerebral infarction: Secondary | ICD-10-CM | POA: Diagnosis not present

## 2017-12-17 DIAGNOSIS — M25532 Pain in left wrist: Secondary | ICD-10-CM | POA: Diagnosis not present

## 2017-12-17 DIAGNOSIS — J309 Allergic rhinitis, unspecified: Secondary | ICD-10-CM | POA: Diagnosis not present

## 2017-12-17 DIAGNOSIS — I69398 Other sequelae of cerebral infarction: Secondary | ICD-10-CM | POA: Diagnosis not present

## 2017-12-17 DIAGNOSIS — M810 Age-related osteoporosis without current pathological fracture: Secondary | ICD-10-CM | POA: Diagnosis not present

## 2017-12-17 DIAGNOSIS — I69313 Psychomotor deficit following cerebral infarction: Secondary | ICD-10-CM | POA: Insufficient documentation

## 2017-12-17 DIAGNOSIS — R262 Difficulty in walking, not elsewhere classified: Secondary | ICD-10-CM | POA: Insufficient documentation

## 2017-12-17 DIAGNOSIS — R269 Unspecified abnormalities of gait and mobility: Secondary | ICD-10-CM | POA: Diagnosis not present

## 2017-12-17 DIAGNOSIS — Z8261 Family history of arthritis: Secondary | ICD-10-CM | POA: Insufficient documentation

## 2017-12-17 DIAGNOSIS — M1990 Unspecified osteoarthritis, unspecified site: Secondary | ICD-10-CM | POA: Insufficient documentation

## 2017-12-17 DIAGNOSIS — Z809 Family history of malignant neoplasm, unspecified: Secondary | ICD-10-CM | POA: Diagnosis not present

## 2017-12-17 DIAGNOSIS — Z9889 Other specified postprocedural states: Secondary | ICD-10-CM | POA: Insufficient documentation

## 2017-12-17 DIAGNOSIS — Z96643 Presence of artificial hip joint, bilateral: Secondary | ICD-10-CM | POA: Diagnosis not present

## 2017-12-17 DIAGNOSIS — N189 Chronic kidney disease, unspecified: Secondary | ICD-10-CM | POA: Diagnosis not present

## 2017-12-17 DIAGNOSIS — R1310 Dysphagia, unspecified: Secondary | ICD-10-CM | POA: Insufficient documentation

## 2017-12-17 DIAGNOSIS — Z9181 History of falling: Secondary | ICD-10-CM | POA: Diagnosis not present

## 2017-12-17 NOTE — Progress Notes (Signed)
Subjective:    Patient ID: Theresa Hampton, female    DOB: 1920-04-21, 84 y.o.   MRN: [id number] 84 y.o.left handed femalewith history of HTN, CKD, OA, B-12 deficiency, one week history of difficulty walking progressing to increased falls.She was admitted on 12/30/18AMafter found by family with left facial droop. MRI brain revealed right pontine stroke with moderate stenosis at origin of R-VA and moderate stenosis L-SCA. Neurology recommended changing ASA to Plavix for stroke likely due to SVD. She had reports of buttock pain as well as left wrist pain at admission and  X rays of pelvis showed evidence of prior sacral fracture and left wrist films showed moderate degenerative changes with soft tissue swelling distal wrist. Patient with resultant deficits in functional mobility, dysphagia as well as difficulty completing ADLs.  CIR was recommended due to functional deficits.  Patient is completed inpatient rehabilitation as well as home health therapy HPI Patient reports sides in assisted living facility.  She ambulates with a walker within her apartment but has not received the okay to ambulate with a walker to the dining room. She complains of some leg swelling and follows up with her primary care on this she is wearing her compression hose.  We also discussed elevating her legs when she is sitting around watching TV etc. Patient is doing a chair exercise program through her assisted living facility Pain Inventory Average Pain 0 Pain Right Now 0 My pain is na  In the last 24 hours, has pain interfered with the following? General activity 0 Relation with others 0 Enjoyment of life 0 What TIME of day is your pain at its worst? na Sleep (in general) NA  Pain is worse with: na Pain improves with: na Relief from Meds: na  Mobility walk with assistance use a walker ability to climb steps?  no do you drive?  no use a wheelchair transfers alone  Function retired I need  assistance with the following:  meal prep, household duties and shopping  Neuro/Psych weakness trouble walking  Prior Studies Any changes since last visit?  no  Physicians involved in your care Any changes since last visit?  no   Family History  Problem Relation Age of Onset  . Arthritis Mother   . Hypertension Unknown   . Cancer Sister    Social History   Socioeconomic History  . Marital status: Widowed    Spouse name: Not on file  . Number of children: Not on file  . Years of education: Not on file  . Highest education level: Not on file  Occupational History  . Occupation: retired  Scientific laboratory technician  . Financial resource strain: Not on file  . Food insecurity:    Worry: Not on file    Inability: Not on file  . Transportation needs:    Medical: Not on file    Non-medical: Not on file  Tobacco Use  . Smoking status: Never Smoker  . Smokeless tobacco: Never Used  Substance and Sexual Activity  . Alcohol use: No  . Drug use: No  . Sexual activity: Not Currently  Lifestyle  . Physical activity:    Days per week: Not on file    Minutes per session: Not on file  . Stress: Not on file  Relationships  . Social connections:    Talks on phone: Not on file    Gets together: Not on file    Attends religious service: Not on file    Active member of club  or organization: Not on file    Attends meetings of clubs or organizations: Not on file    Relationship status: Not on file  Other Topics Concern  . Not on file  Social History Narrative  . Not on file   Past Surgical History:  Procedure Laterality Date  . CATARACT EXTRACTION, BILATERAL Bilateral   . DILATION AND CURETTAGE OF UTERUS    . ENDOMETRIAL ABLATION  2008  . JOINT REPLACEMENT    . THYROIDECTOMY, PARTIAL  "many years ago"  . TOTAL HIP ARTHROPLASTY Left 01/06/2015   Procedure: LEFT TOTAL HIP ARTHROPLASTY ANTERIOR APPROACH;  Surgeon: Gaynelle Arabian, MD;  Location: [facility];  Service: Orthopedics;  Laterality:  Left;  . TOTAL HIP ARTHROPLASTY Right 2004   Past Medical History:  Diagnosis Date  . Allergic rhinitis   . Arthritis    "hips" (05/08/2017)  . B12 deficiency anemia   . Chronic sinusitis   . Daily headache    "q am" (05/08/2017)  . History of transfusion 2004   "w/1st hip OR" (05/08/2017)  . Hyperlipidemia   . Hypertension   . Iron deficiency anemia   . Leg cramps   . Osteoarthritis    Dr. Maureen Ralphs  . Osteoporosis   . Peptic ulcer disease 2003  . PONV (postoperative nausea and vomiting)   . Pyelonephritis, acute 2014  . Seasonal allergies   . Seizures (HCC)    x1 with Lidocaine  . Stroke ([place]) 05/05/2017  . Vitamin B12 deficiency   . Vitamin D deficiency    BP 134/78   Pulse 84   Ht 5\' 5"  (1.651 m) Comment: reported  Wt 131 lb (59.4 kg) Comment: reported  LMP  (LMP Unknown)   SpO2 95%   BMI 21.80 kg/m   Opioid Risk Score:   Fall Risk Score:  `1  Depression screen PHQ 2/9  Depression screen [facility] 2/9 12/17/2017 05/23/2016 10/14/2014  Decreased Interest 0 0 1  Down, Depressed, Hopeless 0 0 0  PHQ - 2 Score 0 0 1   Review of Systems  Constitutional: Negative.   HENT: Negative.   Eyes: Negative.   Respiratory: Negative.   Cardiovascular: Negative.   Gastrointestinal: Negative.   Endocrine: Negative.   Genitourinary: Negative.   Musculoskeletal: Positive for gait problem.  Skin: Negative.   Allergic/Immunologic: Negative.   Neurological: Positive for weakness.  Hematological: Negative.   Psychiatric/Behavioral: Negative.   All other systems reviewed and are negative.      Objective:   Physical Exam  Constitutional: She is oriented to person, place, and time. She appears well-developed and well-nourished. No distress.  HENT:  Head: Normocephalic and atraumatic.  Neurological: She is alert and oriented to person, place, and time.  Skin: Skin is warm and dry. She is not diaphoretic.  Psychiatric: She has a normal mood and affect. Her behavior is normal. Judgment  and thought content normal.  Nursing note and vitals reviewed.   Ambulates 100 feet without difficulty using rolling walker.  No evidence of toe drag or knee instability. Motor strength is 4+ bilateral deltoid bicep tricep grip hip flexion knee extension ankle dorsiflexion Negative straight leg raising Coordination reduced with finger-nose-finger left upper extremity.  In addition the patient has mild disc diadochokinesis with rapid alternating supination pronation of the left upper extremity.      Assessment & Plan:  #1.  Right pontine infarct with residual gait disorder and left sided dysmetria.  Overall has made an excellent recovery.  She is requiring intermittent  supervision.  Her gait has improved to the point where she is safe with a walker to ambulate short community distances At this point she does not require any further one-on-one PT OT I would recommend that she continues her community exercise program. Physical medicine rehab follow-up on as needed basis

## 2017-12-25 ENCOUNTER — Encounter: Payer: Self-pay | Admitting: Sports Medicine

## 2017-12-25 ENCOUNTER — Ambulatory Visit (INDEPENDENT_AMBULATORY_CARE_PROVIDER_SITE_OTHER): Payer: Medicare Other | Admitting: Sports Medicine

## 2017-12-25 ENCOUNTER — Ambulatory Visit (INDEPENDENT_AMBULATORY_CARE_PROVIDER_SITE_OTHER): Payer: Medicare Other

## 2017-12-25 VITALS — BP 118/62 | HR 100 | Ht 65.0 in

## 2017-12-25 DIAGNOSIS — S93492A Sprain of other ligament of left ankle, initial encounter: Secondary | ICD-10-CM | POA: Diagnosis not present

## 2017-12-25 DIAGNOSIS — I6350 Cerebral infarction due to unspecified occlusion or stenosis of unspecified cerebral artery: Secondary | ICD-10-CM

## 2017-12-25 DIAGNOSIS — M79672 Pain in left foot: Secondary | ICD-10-CM

## 2017-12-25 DIAGNOSIS — S9032XA Contusion of left foot, initial encounter: Secondary | ICD-10-CM | POA: Diagnosis not present

## 2017-12-25 NOTE — Progress Notes (Signed)
Juanda Bond. [place], [place] at [place] y.o. female MRN [id number]  Date of birth: November 18, 1919  Visit Date: 12/25/2017  PCP: Cassandria Anger, MD   Referred by: Cassandria Anger, MD  Scribe(s) for today's visit: Wendy Poet, LAT, ATC  SUBJECTIVE:  Theresa Hampton is here for New Patient (Initial Visit) (L foot/ankle pain) .    Her L foot and ankle pain symptoms INITIALLY: Began at 4:30 am today when she was going to the bathroom when her feet slipped out from under her and injured her L ankle. Described as moderate throbbing pain, nonradiating Worsened with weight bearing Improved with pain medication Additional associated symptoms include: swelling noted in the L foot; no N/T    At this time symptoms are improving compared to onset. She has been elevating and using compression.   REVIEW OF SYSTEMS: Denies night time disturbances. Denies fevers, chills, or night sweats. Denies unexplained weight loss. Denies personal history of cancer. Reports changes in bowel or bladder habits.  Has issues w/ incontinence. Reports recent unreported falls. Denies new or worsening dyspnea or wheezing. Denies headaches or dizziness.  Denies numbness, tingling or weakness  In the extremities.  Denies dizziness or presyncopal episodes Reports lower extremity edema - L ankle swelling   HISTORY & PERTINENT PRIOR DATA:  Significant/pertinent history, findings, studies include:  reports that she has never smoked. She has never used smokeless tobacco. Recent Labs    05/07/17 0330  HGBA1C 5.3   No specialty comments available. No problems updated.  Otherwise prior history reviewed and updated per electronic medical record.   OBJECTIVE:  VS:  HT:5\' 5"  (165.1 cm)   WT:   BMI:     BP:118/62  HR:100bpm  TEMP: ( )  RESP:94 %   PHYSICAL EXAM: CONSTITUTIONAL: Well-developed, Well-nourished and In  no acute distress Alert & appropriately interactive. and Not depressed or anxious appearing. RESPIRATORY: No increased work of breathing and Trachea Midline EYES: Pupils are equal., EOM intact without nystagmus. and No scleral icterus.  Lower extremities: Warm and well perfused Edema: Joint associated swelling as per Orthopedic exam and 1+ pitting edema pretibial bilaterally NEURO: unremarkable Normal sensation to light touch  MSK Exam: Left foot and ankle: . Well aligned, no significant deformity. . No overlying skin changes. . TTP over Anterior lateral ankle swelling.  Mild pain over the metatarsal.  No significant pain anterior aspects. . Non tender over Mid shin.  No pain with squeeze test . Ankle range of motion is limited by approximately 20 degrees of dorsiflexion and 10 degrees of plantarflexion on the left compared to the right.  Good midfoot motion. . Ligamentously stable to Anterior drawer and talar tilting although slightly painful with both of these.    PROCEDURES & DATA REVIEWED:  X-Rays obtained today and reviewed with the patient that showed: Osteopenic bones.  No overt fractures appreciated.  Three-view x-ray of the foot only obtained.  Given negative auto ankle but if any persistent or significant pain repeat x-rays of the ankle could be considered.  ASSESSMENT   1. Left foot pain   2. Sprain of anterior talofibular ligament of left ankle, initial encounter     PLAN:    Rest the injured area as much as practical Apply ice packs Elevate the injured limb Compressive bandage recommended Brace dispensed and applied    Symptoms are most consistent with an ankle sprain.  We  will place her into an ASO and have her minimize her weightbearing which she Artie does and she is wheelchair dependent.  Recommend rest, elevation, ice and tramadol as needed.  Paperwork completed for her facility at the time of visit.  No problem-specific Assessment & Plan notes found for this  encounter.  Follow-up: Return in about 1 week (around 01/01/2018).      Please see additional documentation for Objective, Assessment and Plan sections. Pertinent additional documentation may be included in corresponding procedure notes, imaging studies, problem based documentation and patient instructions. Please see these sections of the encounter for additional information regarding this visit.  CMA/ATC served as Education administrator during this visit. History, Physical, and Plan performed by medical provider. Documentation and orders reviewed and attested to.      Gerda Diss, [place] Sports Medicine Physician

## 2017-12-26 ENCOUNTER — Encounter: Payer: Self-pay | Admitting: Sports Medicine

## 2018-01-01 ENCOUNTER — Other Ambulatory Visit: Payer: Self-pay

## 2018-01-01 MED ORDER — TRAMADOL HCL 50 MG PO TABS: 25.0000 mg | ORAL_TABLET | Freq: Two times a day (BID) | ORAL | 5 refills | Status: DC | PRN

## 2018-01-03 ENCOUNTER — Telehealth: Payer: Self-pay | Admitting: Internal Medicine

## 2018-01-03 ENCOUNTER — Ambulatory Visit (INDEPENDENT_AMBULATORY_CARE_PROVIDER_SITE_OTHER): Payer: Medicare Other | Admitting: Sports Medicine

## 2018-01-03 ENCOUNTER — Encounter: Payer: Self-pay | Admitting: Sports Medicine

## 2018-01-03 VITALS — BP 124/80 | HR 87 | Ht 65.0 in

## 2018-01-03 DIAGNOSIS — S93492A Sprain of other ligament of left ankle, initial encounter: Secondary | ICD-10-CM

## 2018-01-03 DIAGNOSIS — R600 Localized edema: Secondary | ICD-10-CM | POA: Diagnosis not present

## 2018-01-03 DIAGNOSIS — M79672 Pain in left foot: Secondary | ICD-10-CM

## 2018-01-03 DIAGNOSIS — I6350 Cerebral infarction due to unspecified occlusion or stenosis of unspecified cerebral artery: Secondary | ICD-10-CM

## 2018-01-03 NOTE — Telephone Encounter (Signed)
See note

## 2018-01-03 NOTE — Telephone Encounter (Unsigned)
Copied from [place] [id number]. Topic: Quick Communication - Rx Refill/Question >> Jan 03, 2018 12:11 PM Sheran Luz wrote: Medication: traMADol Veatrice Bourbon)  Candice from [facility] assisted living called inquiring about the pts orders for traMADol Veatrice Bourbon). Candice stated that they have three separate orders, one of which was brought to them by pts daughter yesterday. The orders are all different dosages and frequencies. Candice would like to know which one is up to date. Please advise. [phone]

## 2018-01-03 NOTE — Progress Notes (Signed)
Juanda Bond. Tysin [place], [place] at [place] y.o. female MRN [id number]  Date of birth: [birth year]  Visit Date: 01/03/2018  PCP: Cassandria Anger, MD   Referred by: Cassandria Anger, MD  Scribe(s) for today's visit: Josepha Pigg, CMA  SUBJECTIVE:  Theresa Hampton is here for Follow-up (L ankle)   12/25/2017: Her L foot and ankle pain symptoms INITIALLY: Began at 4:30 am today when she was going to the bathroom when her feet slipped out from under her and injured her L ankle. Described as moderate throbbing pain, nonradiating Worsened with weight bearing Improved with pain medication Additional associated symptoms include: swelling noted in the L foot; no N/T   At this time symptoms are improving compared to onset. She has been elevating and using compression.  01/03/2018: Compared to the last office visit, her previously described symptoms are improving. She does note occasional burning sensation in the ankle. She reports that she does have RLS and the burning sensation that she feels is similar to those sx. Sx seem to be worse at night.  Current symptoms are moderate & are nonradiating She has been taking Tramadol with some relief. She is wearing compression hose and ASO brace.    REVIEW OF SYSTEMS: Reports night time disturbances. Denies fevers, chills, or night sweats. Denies unexplained weight loss. Denies personal history of cancer. Denies changes in bowel or bladder habits. Denies recent unreported falls. Denies new or worsening dyspnea or wheezing. Denies headaches or dizziness.  Denies numbness, tingling or weakness  In the extremities.  Denies dizziness or presyncopal episodes Denies lower extremity edema     HISTORY & PERTINENT PRIOR DATA:  Significant/pertinent history, findings, studies include:  reports that she has never smoked. She has never used smokeless  tobacco. Recent Labs    05/07/17 0330  HGBA1C 5.3   No specialty comments available. No problems updated.  Otherwise prior history reviewed and updated per electronic medical record.    OBJECTIVE:  VS:  HT:5\' 5"  (165.1 cm)   WT:   BMI:     BP:124/80  HR:87bpm  TEMP: ( )  RESP:95 %   PHYSICAL EXAM: CONSTITUTIONAL: Well-developed, Well-nourished and In no acute distress Alert & appropriately interactive. and Not depressed or anxious appearing. RESPIRATORY: No increased work of breathing and Trachea Midline EYES: Pupils are equal., EOM intact without nystagmus. and No scleral icterus.  Lower extremities: Warm and well perfused Pulses: DP Pulses: Left 2+/4 PT Pulses: Left 2+/4 Edema: Joint associated swelling as per Orthopedic exam and No Pre-tibial edema NEURO: unremarkable  MSK Exam: Left ankle foot: . Well aligned, no significant deformity. . Small amount of bruising and significant dependent edema. . TTP over Anterior lateral ankle over the cuboid and distal talus.  No base of the fifth metatarsal pain no significant pain along the distal tibia or fibula.  No focal pain with ankle dorsiflexion and plantarflexion. .  . Ligamentously stable to Anterior drawer.  Thumb pain but no instability with talar tilting. Marland Kitchen    PROCEDURES & DATA REVIEWED:  . Outside/prior images reviewed today with the patient that showed Reviewed again once today given the focal area of pain over the talus there is a possible small avulsion at this level.  No obvious significant fracture.  Is possibly an accessory bone.  ASSESSMENT   1. Left foot pain   2. Sprain of anterior talofibular ligament of left ankle, initial  encounter   3. Localized edema     PLAN:   Rest the injured area as much as practical Apply ice packs Elevate the injured limb    . Directions provided to her nursing facility to continue wearing the ASO around-the-clock. . I suspect she has a small avulsion of the talus  consistent with a severe ankle sprain.  This is improving and she is otherwise doing quite well. . Tramadol prescription modified today to be taken  . Compression socks 15 to 20 mmHg to be worn while awake. . >50% of this 25 minutes minute visit spent in direct patient counseling and/or coordination of care. Discussion was focused on education regarding the in discussing the pathoetiology and anticipated clinical course of the above condition. No problem-specific Assessment & Plan notes found for this encounter.    Follow-up: Return in about 2 weeks (around 01/17/2018).      Please see additional documentation for Objective, Assessment and Plan sections. Pertinent additional documentation may be included in corresponding procedure notes, imaging studies, problem based documentation and patient instructions. Please see these sections of the encounter for additional information regarding this visit.  CMA/ATC served as Education administrator during this visit. History, Physical, and Plan performed by medical provider. Documentation and orders reviewed and attested to.      Gerda Diss, [place] Sports Medicine Physician

## 2018-01-17 ENCOUNTER — Ambulatory Visit: Payer: Medicare Other | Admitting: Sports Medicine

## 2018-01-24 ENCOUNTER — Ambulatory Visit (INDEPENDENT_AMBULATORY_CARE_PROVIDER_SITE_OTHER): Payer: Medicare Other | Admitting: Sports Medicine

## 2018-01-24 ENCOUNTER — Encounter: Payer: Self-pay | Admitting: Sports Medicine

## 2018-01-24 ENCOUNTER — Other Ambulatory Visit: Payer: Self-pay | Admitting: Internal Medicine

## 2018-01-24 VITALS — BP 130/74 | HR 94 | Ht 65.0 in | Wt 137.2 lb

## 2018-01-24 DIAGNOSIS — S93492A Sprain of other ligament of left ankle, initial encounter: Secondary | ICD-10-CM

## 2018-01-24 DIAGNOSIS — I6350 Cerebral infarction due to unspecified occlusion or stenosis of unspecified cerebral artery: Secondary | ICD-10-CM | POA: Diagnosis not present

## 2018-01-24 DIAGNOSIS — M79672 Pain in left foot: Secondary | ICD-10-CM

## 2018-01-24 DIAGNOSIS — R600 Localized edema: Secondary | ICD-10-CM

## 2018-01-24 NOTE — Telephone Encounter (Signed)
Copied from [place] [id number]. Topic: Quick Communication - Rx Refill/Question >> Jan 24, 2018  2:24 PM Gardiner Ramus wrote: Medication:traMADol (ULTRAM) 50 MG tablet [[id number]]   Has the patient contacted their pharmacy? yes Preferred Pharmacy (with phone number or street name): [place], [place] - [address] [phone] (Phone) [phone] (Fax)  Agent: Please be advised that RX refills may take up to 3 business days. We ask that you follow-up with your pharmacy.

## 2018-01-24 NOTE — Telephone Encounter (Signed)
spoke with pharmacy and they have RX

## 2018-01-24 NOTE — Progress Notes (Signed)
Theresa Hampton. [place], [place] y.o. female MRN [id number]  Date of birth: 09/24/1919  Visit Date: 01/24/2018  PCP: Cassandria Anger, MD   Referred by: Cassandria Anger, MD  Scribe(s) for today's visit: Josepha Pigg, CMA  SUBJECTIVE:  Theresa Hampton is here for Follow-up (L foot pain)  HPI 12/25/2017: Her L foot and ankle pain symptoms INITIALLY: Began at 4:30 am today when she was going to the bathroom when her feet slipped out from under her and injured her L ankle. Described as moderate throbbing pain, nonradiating Worsened with weight bearing Improved with pain medication Additional associated symptoms include: swelling noted in the L foot; no N/T   At this time symptoms are improving compared to onset. She has been elevating and using compression.  01/03/2018: Compared to the last office visit, her previously described symptoms are improving. She does note occasional burning sensation in the ankle. She reports that she does have RLS and the burning sensation that she feels is similar to those sx. Sx seem to be worse at night.  Current symptoms are moderate & are nonradiating She has been taking Tramadol with some relief. She is wearing compression hose and ASO brace.   01/24/2018: Compared to the last office visit, her previously described symptoms are improving. The swelling has gone down significantly.  Current symptoms are mild & are non-radiating. She has been taking Tramadol and/or Tylenol with some relief. She has been wearing brace and feels that it is supportive.   REVIEW OF SYSTEMS: Reports night time disturbances. Denies fevers, chills, or night sweats. Denies unexplained weight loss. Denies personal history of cancer. Denies changes in bowel or bladder habits. Denies recent unreported falls. Denies new or worsening dyspnea or wheezing. Denies headaches or  dizziness.  Denies numbness, tingling or weakness  In the extremities.  Denies dizziness or presyncopal episodes Denies lower extremity edema     HISTORY & PERTINENT PRIOR DATA:  Significant/pertinent history, findings, studies include:  reports that she has never smoked. She has never used smokeless tobacco. Recent Labs    05/07/17 0330  HGBA1C 5.3   No specialty comments available. No problems updated.  Otherwise prior history reviewed and updated per electronic medical record.    OBJECTIVE:  VS:  HT:5\' 5"  (165.1 cm)   WT:137 lb 3.2 oz (62.2 kg)  BMI:22.83    BP:130/74  HR:94bpm  TEMP: ( )  RESP:97 %   PHYSICAL EXAM: CONSTITUTIONAL: Well-developed, Well-nourished and In no acute distress Psychiatric: Alert & appropriately interactive. and Not depressed or anxious appearing. RESPIRATORY: No increased work of breathing and Trachea Midline EYES: Pupils are equal., EOM intact without nystagmus. and No scleral icterus.  Lower extremities: EXTREMITY EXAM: Warm and well perfused Pulses: DP Pulses: Left 2+/4 PT Pulses: Left 2+/4 Edema: Joint associated swelling as per Orthopedic exam and No Pre-tibial edema NEURO: unremarkable  MSK Exam: Left ankle foot: . Well aligned, no significant deformity. . Small amount of bruising and significant dependent edema. . TTP over Anterior lateral ankle over the cuboid and distal talus.  No base of the fifth metatarsal pain no significant pain along the distal tibia or fibula.  No focal pain with ankle dorsiflexion and plantarflexion. .  . Ligamentously stable to Anterior drawer.  Mild pain but no instability with talar tilting.    ASSESSMENT   1. Sprain of anterior talofibular ligament of left ankle, initial  encounter   2. Localized edema   3. Left foot pain     PLAN:  Pertinent additional documentation may be included in corresponding procedure notes, imaging studies, problem based documentation and patient  instructions.  Procedures:  . None  Medications:  No orders of the defined types were placed in this encounter.  Discussion/Instructions: No problem-specific Assessment & Plan notes found for this encounter.  . She is doing significantly better.  We will have her continue using the compression socks as this has been helpful but she can decrease using the ASO to the point that she is only wearing it when weightbearing but would recommend at this time putting it on in the morning and discontinuing it at nighttime. . Like for her to begin working with physical therapy at [facility] and will complete paperwork to send this over to be completed. . Tramadol will be changed to taking half a tablet twice a day. . Discussed red flag symptoms that warrant earlier emergent evaluation and patient voices understanding. . Activity modifications and the importance of avoiding exacerbating activities (limiting pain to no more than a 4 / 10 during or following activity) recommended and discussed.  Follow-up:  . Return in about 2 weeks (around 02/07/2018).   . At follow up will plan to consider: Discontinuation of the ASO.     CMA/ATC served as Education administrator during this visit. History, Physical, and Plan performed by medical provider. Documentation and orders reviewed and attested to.      Gerda Diss, [facility] Physician

## 2018-01-24 NOTE — Telephone Encounter (Signed)
Rx refill request: tramadol 50 mg       Last filled: 01/01/18  LOV: 10/26/17  PCP: Plotnikov  Pharmacy: verified

## 2018-02-07 ENCOUNTER — Ambulatory Visit (INDEPENDENT_AMBULATORY_CARE_PROVIDER_SITE_OTHER): Payer: Medicare Other | Admitting: Sports Medicine

## 2018-02-07 ENCOUNTER — Encounter: Payer: Self-pay | Admitting: Sports Medicine

## 2018-02-07 VITALS — BP 122/70 | HR 89 | Ht 65.0 in | Wt 136.4 lb

## 2018-02-07 DIAGNOSIS — I6350 Cerebral infarction due to unspecified occlusion or stenosis of unspecified cerebral artery: Secondary | ICD-10-CM | POA: Diagnosis not present

## 2018-02-07 DIAGNOSIS — R600 Localized edema: Secondary | ICD-10-CM | POA: Diagnosis not present

## 2018-02-07 DIAGNOSIS — R269 Unspecified abnormalities of gait and mobility: Secondary | ICD-10-CM

## 2018-02-07 DIAGNOSIS — S93492A Sprain of other ligament of left ankle, initial encounter: Secondary | ICD-10-CM

## 2018-02-07 MED ORDER — TRAMADOL HCL 50 MG PO TABS: 25.0000 mg | ORAL_TABLET | Freq: Two times a day (BID) | ORAL | 1 refills | Status: DC | PRN

## 2018-02-07 NOTE — Progress Notes (Signed)
Theresa Hampton. Theresa Hampton, Theresa Hampton y.o. female MRN [id number]  Date of birth: [birth year]  Visit Date: 02/07/2018  PCP: Cassandria Anger, MD   Referred by: Cassandria Anger, MD  Scribe(s) for today's visit: Josepha Pigg, CMA  SUBJECTIVE:  Theresa Hampton is here for Follow-up (L foot / ankle pain)  HPI 12/25/2017: Her L foot and ankle pain symptoms INITIALLY: Began at 4:30 am today when she was going to the bathroom when her feet slipped out from under her and injured her L ankle. Described as moderate throbbing pain, nonradiating Worsened with weight bearing Improved with pain medication Additional associated symptoms include: swelling noted in the L foot; no N/T   At this time symptoms are improving compared to onset. She has been elevating and using compression.  01/03/2018: Compared to the last office visit, her previously described symptoms are improving. She does note occasional burning sensation in the ankle. She reports that she does have RLS and the burning sensation that she feels is similar to those sx. Sx seem to be worse at night.  Current symptoms are moderate & are nonradiating She has been taking Tramadol with some relief. She is wearing compression hose and ASO brace.   01/24/2018: Compared to the last office visit, her previously described symptoms are improving. The swelling has gone down significantly.  Current symptoms are mild & are non-radiating. She has been taking Tramadol and/or Tylenol with some relief. She has been wearing brace and feels that it is supportive.   02/07/2018: Compared to the last office visit on 01/24/18, her previously described L foot and ankle symptoms are improving w/ decreased pain noted.  Majority of her pain is along the top of the L foot. Current symptoms are moderate burning, intermittent pain along the top of her L foot & are  nonradiating She has been taking Tylenol and 1/2 Tramadol BID.  She's been wearing an ASO brace on her L ankle for weight-bearing activities.  She's not been contacted regarding PT at [facility].  L foot XR - 12/25/17  REVIEW OF SYSTEMS: Reports night time disturbances. Denies fevers, chills, or night sweats. Denies unexplained weight loss. Denies personal history of cancer. Denies changes in bowel or bladder habits. Denies recent unreported falls. Denies new or worsening dyspnea or wheezing. Denies headaches or dizziness.  Denies numbness, tingling or weakness  In the extremities.  Denies dizziness or presyncopal episodes Denies lower extremity edema     HISTORY & PERTINENT PRIOR DATA:  Significant/pertinent history, findings, studies include:  reports that she has never smoked. She has never used smokeless tobacco. Recent Labs    05/07/17 0330  HGBA1C 5.3   No specialty comments available. No problems updated.  Otherwise prior history reviewed and updated per electronic medical record.    OBJECTIVE:  VS:  HT:5\' 5"  (165.1 cm)   WT:136 lb 6.4 oz (61.9 kg)  BMI:22.7    BP:122/70  HR:89bpm  TEMP: ( )  RESP:95 %   PHYSICAL EXAM: CONSTITUTIONAL: Well-developed, Well-nourished and In no acute distress Psychiatric: Alert & appropriately interactive. and Not depressed or anxious appearing. RESPIRATORY: No increased work of breathing and Trachea Midline EYES: Pupils are equal., EOM intact without nystagmus. and No scleral icterus.  Lower extremities: EXTREMITY EXAM: Warm and well perfused Pulses: DP Pulses: Left 2+/4 PT Pulses: Left 2+/4 Edema: Joint associated swelling as per Orthopedic exam and No Pre-tibial edema  NEURO: unremarkable  MSK Exam: Left ankle foot: . Well aligned, no significant deformity. . No overlying skin changes. . No focal bony tenderness .  Marland Kitchen Ligamentously stable to Anterior drawer.  Mild pain but no instability with talar tilting.     ASSESSMENT   1. Sprain of anterior talofibular ligament of left ankle, initial encounter   2. Gait disturbance   3. Localized edema     PLAN:  Pertinent additional documentation may be included in corresponding procedure notes, imaging studies, problem based documentation and patient instructions.  Procedures:  . None  Medications:  Meds ordered this encounter  Medications  . DISCONTD: traMADol (ULTRAM) 50 MG tablet    Sig: Take 0.5 tablets (25 mg total) by mouth every 12 (twelve) hours as needed for moderate pain or severe pain.    Dispense:  30 tablet    Refill:  1    Please defer to Dr. Alain Marion for further refills. Resuming prior dosing  . traMADol (ULTRAM) 50 MG tablet    Sig: Take 0.5 tablets (25 mg total) by mouth every 12 (twelve) hours as needed for moderate pain or severe pain.    Dispense:  30 tablet    Refill:  1    Please defer to Dr. Alain Marion for further refills. Resuming prior dosing   Discussion/Instructions: No problem-specific Assessment & Plan notes found for this encounter.  . She is doing significantly better.  We will have her discontinue the ASO at this time given it does seem to be causing her more discomfort actually. Marland Kitchen Physical therapy order today to begin at [facility].  Paperwork provided to the patient and daughter today to take with them back. . Tramadol will be changed to taking half a tablet twice a day.  This is the prior prescription that she had from Dr. Alain Marion and I will defer to him for further refills after this last one from me. . Discussed red flag symptoms that warrant earlier emergent evaluation and patient voices understanding. . Activity modifications and the importance of avoiding exacerbating activities (limiting pain to no more than a 4 / 10 during or following activity) recommended and discussed.  Follow-up:  . Return in about 4 weeks (around 03/07/2018).  Recheck clinically.  Plan to likely be able to discharge from  care at that time.        CMA/ATC served as Education administrator during this visit. History, Physical, and Plan performed by medical provider. Documentation and orders reviewed and attested to.      Gerda Diss, [facility] Physician

## 2018-02-12 ENCOUNTER — Telehealth: Payer: Self-pay

## 2018-02-12 DIAGNOSIS — S93492D Sprain of other ligament of left ankle, subsequent encounter: Secondary | ICD-10-CM | POA: Diagnosis not present

## 2018-02-12 DIAGNOSIS — M1991 Primary osteoarthritis, unspecified site: Secondary | ICD-10-CM | POA: Diagnosis not present

## 2018-02-12 DIAGNOSIS — Z9181 History of falling: Secondary | ICD-10-CM | POA: Diagnosis not present

## 2018-02-12 DIAGNOSIS — Z7902 Long term (current) use of antithrombotics/antiplatelets: Secondary | ICD-10-CM | POA: Diagnosis not present

## 2018-02-12 DIAGNOSIS — Z8673 Personal history of transient ischemic attack (TIA), and cerebral infarction without residual deficits: Secondary | ICD-10-CM | POA: Diagnosis not present

## 2018-02-12 NOTE — Telephone Encounter (Signed)
FYI  Copied from [facility] [id number]. Topic: General - Other >> Feb 12, 2018  3:07 PM Yvette Rack wrote: Reason for CRM: Nurse Darlina Guys from [facility] [phone] calling stating that they will be going to see pt tomorrow for home health services

## 2018-02-13 NOTE — Telephone Encounter (Signed)
Noted. Thx.

## 2018-02-19 DIAGNOSIS — S93492D Sprain of other ligament of left ankle, subsequent encounter: Secondary | ICD-10-CM | POA: Diagnosis not present

## 2018-02-19 DIAGNOSIS — Z8673 Personal history of transient ischemic attack (TIA), and cerebral infarction without residual deficits: Secondary | ICD-10-CM | POA: Diagnosis not present

## 2018-02-19 DIAGNOSIS — M1991 Primary osteoarthritis, unspecified site: Secondary | ICD-10-CM | POA: Diagnosis not present

## 2018-02-19 DIAGNOSIS — Z7902 Long term (current) use of antithrombotics/antiplatelets: Secondary | ICD-10-CM | POA: Diagnosis not present

## 2018-02-19 DIAGNOSIS — Z9181 History of falling: Secondary | ICD-10-CM | POA: Diagnosis not present

## 2018-02-21 DIAGNOSIS — M1991 Primary osteoarthritis, unspecified site: Secondary | ICD-10-CM | POA: Diagnosis not present

## 2018-02-21 DIAGNOSIS — Z9181 History of falling: Secondary | ICD-10-CM | POA: Diagnosis not present

## 2018-02-21 DIAGNOSIS — Z7902 Long term (current) use of antithrombotics/antiplatelets: Secondary | ICD-10-CM | POA: Diagnosis not present

## 2018-02-21 DIAGNOSIS — S93492D Sprain of other ligament of left ankle, subsequent encounter: Secondary | ICD-10-CM | POA: Diagnosis not present

## 2018-02-21 DIAGNOSIS — Z8673 Personal history of transient ischemic attack (TIA), and cerebral infarction without residual deficits: Secondary | ICD-10-CM | POA: Diagnosis not present

## 2018-02-25 DIAGNOSIS — S93492D Sprain of other ligament of left ankle, subsequent encounter: Secondary | ICD-10-CM | POA: Diagnosis not present

## 2018-02-25 DIAGNOSIS — M1991 Primary osteoarthritis, unspecified site: Secondary | ICD-10-CM | POA: Diagnosis not present

## 2018-02-25 DIAGNOSIS — Z9181 History of falling: Secondary | ICD-10-CM | POA: Diagnosis not present

## 2018-02-25 DIAGNOSIS — Z7902 Long term (current) use of antithrombotics/antiplatelets: Secondary | ICD-10-CM | POA: Diagnosis not present

## 2018-02-25 DIAGNOSIS — Z8673 Personal history of transient ischemic attack (TIA), and cerebral infarction without residual deficits: Secondary | ICD-10-CM | POA: Diagnosis not present

## 2018-02-26 DIAGNOSIS — Z7902 Long term (current) use of antithrombotics/antiplatelets: Secondary | ICD-10-CM | POA: Diagnosis not present

## 2018-02-26 DIAGNOSIS — Z9181 History of falling: Secondary | ICD-10-CM | POA: Diagnosis not present

## 2018-02-26 DIAGNOSIS — M1991 Primary osteoarthritis, unspecified site: Secondary | ICD-10-CM | POA: Diagnosis not present

## 2018-02-26 DIAGNOSIS — S93492D Sprain of other ligament of left ankle, subsequent encounter: Secondary | ICD-10-CM | POA: Diagnosis not present

## 2018-02-26 DIAGNOSIS — Z8673 Personal history of transient ischemic attack (TIA), and cerebral infarction without residual deficits: Secondary | ICD-10-CM | POA: Diagnosis not present

## 2018-02-27 ENCOUNTER — Encounter: Payer: Self-pay | Admitting: Internal Medicine

## 2018-02-27 ENCOUNTER — Ambulatory Visit (INDEPENDENT_AMBULATORY_CARE_PROVIDER_SITE_OTHER): Payer: Medicare Other | Admitting: Internal Medicine

## 2018-02-27 VITALS — BP 118/72 | HR 88 | Temp 97.9°F | Ht 65.0 in | Wt 136.0 lb

## 2018-02-27 DIAGNOSIS — M792 Neuralgia and neuritis, unspecified: Secondary | ICD-10-CM

## 2018-02-27 DIAGNOSIS — I6350 Cerebral infarction due to unspecified occlusion or stenosis of unspecified cerebral artery: Secondary | ICD-10-CM

## 2018-02-27 DIAGNOSIS — Z23 Encounter for immunization: Secondary | ICD-10-CM

## 2018-02-27 DIAGNOSIS — E538 Deficiency of other specified B group vitamins: Secondary | ICD-10-CM | POA: Diagnosis not present

## 2018-02-27 DIAGNOSIS — F411 Generalized anxiety disorder: Secondary | ICD-10-CM

## 2018-02-27 DIAGNOSIS — I6302 Cerebral infarction due to thrombosis of basilar artery: Secondary | ICD-10-CM

## 2018-02-27 DIAGNOSIS — E559 Vitamin D deficiency, unspecified: Secondary | ICD-10-CM | POA: Diagnosis not present

## 2018-02-27 DIAGNOSIS — I10 Essential (primary) hypertension: Secondary | ICD-10-CM

## 2018-02-27 DIAGNOSIS — H9201 Otalgia, right ear: Secondary | ICD-10-CM | POA: Insufficient documentation

## 2018-02-27 NOTE — Assessment & Plan Note (Signed)
Tramadol prn 

## 2018-02-27 NOTE — Assessment & Plan Note (Signed)
Using a walker In PT now

## 2018-02-27 NOTE — Progress Notes (Signed)
Subjective:  Patient ID: Theresa Hampton, female    DOB: 1919/08/01  Age: 84 y.o. MRN: [id number]  CC: No chief complaint on file.   HPI TIPPI MCCRAE presents for CVA, sprained ankle, OA, dyslipidemia f/u  C/o R mastoid painful at times when in bed   Outpatient Medications Prior to Visit  Medication Sig Dispense Refill  . acetaminophen (TYLENOL) 325 MG tablet Take 2 tablets (650 mg total) by mouth every 6 (six) hours as needed.    Marland Kitchen atorvastatin (LIPITOR) 40 MG tablet Take 1 tablet (40 mg total) by mouth daily at 6 PM. 30 tablet 0  . cholecalciferol (VITAMIN D) 400 units TABS tablet Take 400 Units by mouth.    . CLONAZEPAM PO Take 0.25 mg by mouth as needed.    . clopidogrel (PLAVIX) 75 MG tablet Take 1 tablet (75 mg total) by mouth daily. 30 tablet 0  . diclofenac sodium (VOLTAREN) 1 % GEL Apply 2 g topically 4 (four) times daily. 4 Tube 0  . fluticasone (FLONASE) 50 MCG/ACT nasal spray Place into both nostrils daily.    . furosemide (LASIX) 20 MG tablet Take 1 tablet (20 mg total) by mouth daily. 30 tablet 5  . gabapentin (NEURONTIN) 100 MG capsule TAKE 1 CAPSULE BY MOUTH AT BEDTIME 30 capsule 1  . loratadine (CLARITIN) 10 MG tablet Take 1 tablet (10 mg total) by mouth daily. 100 tablet 2  . LUMIGAN 0.01 % SOLN instill 1 drop IN [facility] EYE AT BEDTIME 7.5 mL 0  . Menthol-Methyl Salicylate (MUSCLE RUB) 10-15 % CREA Apply 1 application topically 2 (two) times daily as needed for muscle pain (BACK PAIN).  0  . mirtazapine (REMERON) 15 MG tablet TAKE ONE TABLET BY MOUTH EVERY EVENING BEFORE DINNER AT 4 - 5 PM 30 tablet 1  . Multiple Vitamins-Iron (MULTIVITAMINS WITH IRON) TABS tablet Take 1 tablet by mouth daily.  0  . potassium chloride (KLOR-CON 10) 10 MEQ tablet Take 1 tablet (10 mEq total) by mouth daily. 30 tablet 5  . senna-docusate (SENOKOT-S) 8.6-50 MG tablet Take 1 tablet by mouth at bedtime as needed for moderate constipation. 30 tablet 0  . tolterodine (DETROL LA) 4 MG 24  hr capsule Take 1 capsule (4 mg total) by mouth daily. 30 capsule 11  . traMADol (ULTRAM) 50 MG tablet Take 0.5 tablets (25 mg total) by mouth every 12 (twelve) hours as needed for moderate pain or severe pain. 30 tablet 1  . traZODone (DESYREL) 50 MG tablet Take 0.5 tablets (25 mg total) by mouth at bedtime. 15 tablet 3  . vitamin B-12 (CYANOCOBALAMIN) 100 MCG tablet Take 100 mcg by mouth daily.     No facility-administered medications prior to visit.     ROS: Review of Systems  Constitutional: Negative for activity change, appetite change, chills, fatigue and unexpected weight change.  HENT: Negative for congestion, mouth sores and sinus pressure.   Eyes: Negative for visual disturbance.  Respiratory: Negative for cough and chest tightness.   Gastrointestinal: Negative for abdominal pain and nausea.  Genitourinary: Negative for difficulty urinating, frequency and vaginal pain.  Musculoskeletal: Positive for arthralgias and gait problem. Negative for back pain.  Skin: Negative for pallor and rash.  Neurological: Negative for dizziness, tremors, weakness, numbness and headaches.  Psychiatric/Behavioral: Negative for confusion, sleep disturbance and suicidal ideas.    Objective:  BP 118/72 (BP Location: Left Arm, Patient Position: Sitting, Cuff Size: Normal)   Pulse 88   Temp 97.9 F (  36.6 C) (Oral)   Ht 5\' 5"  (1.651 m)   Wt 136 lb (61.7 kg)   LMP  (LMP Unknown)   SpO2 96%   BMI 22.63 kg/m   BP Readings from Last 3 Encounters:  02/27/18 118/72  02/07/18 122/70  01/24/18 130/74    Wt Readings from Last 3 Encounters:  02/27/18 136 lb (61.7 kg)  02/07/18 136 lb 6.4 oz (61.9 kg)  01/24/18 137 lb 3.2 oz (62.2 kg)    Physical Exam  Constitutional: She appears well-developed. No distress.  HENT:  Head: Normocephalic.  Right Ear: External ear normal.  Left Ear: External ear normal.  Nose: Nose normal.  Mouth/Throat: Oropharynx is clear and moist.  Eyes: Pupils are equal,  round, and reactive to light. Conjunctivae are normal. Right eye exhibits no discharge. Left eye exhibits no discharge.  Neck: Normal range of motion. Neck supple. No JVD present. No tracheal deviation present. No thyromegaly present.  Cardiovascular: Normal rate, regular rhythm and normal heart sounds.  Pulmonary/Chest: No stridor. No respiratory distress. She has no wheezes.  Abdominal: Soft. Bowel sounds are normal. She exhibits no distension and no mass. There is no tenderness. There is no rebound and no guarding.  Musculoskeletal: She exhibits no edema or tenderness.  Lymphadenopathy:    She has no cervical adenopathy.  Neurological: She displays normal reflexes. No cranial nerve deficit. She exhibits normal muscle tone. Coordination abnormal.  Skin: No rash noted. No erythema.  Psychiatric: She has a normal mood and affect. Her behavior is normal. Judgment and thought content normal.  in a w/c R mastoid NT  Lab Results  Component Value Date   WBC 8.9 10/26/2017   HGB 12.0 10/26/2017   HCT 35.6 (L) 10/26/2017   PLT 265.0 10/26/2017   GLUCOSE 108 (H) 10/26/2017   CHOL 217 (H) 05/07/2017   TRIG 94 05/07/2017   HDL 65 05/07/2017   LDLDIRECT 125.4 05/10/2011   LDLCALC 133 (H) 05/07/2017   ALT 16 05/10/2017   AST 25 05/10/2017   NA 139 10/26/2017   K 3.8 10/26/2017   CL 101 10/26/2017   CREATININE 1.12 10/26/2017   BUN 16 10/26/2017   CO2 29 10/26/2017   TSH 2.38 10/26/2017   INR 1.01 05/06/2017   HGBA1C 5.3 05/07/2017    No results found.  Assessment & Plan:   There are no diagnoses linked to this encounter.   No orders of the defined types were placed in this encounter.    Follow-up: No follow-ups on file.  Walker Kehr, MD

## 2018-02-27 NOTE — Assessment & Plan Note (Signed)
On B12 

## 2018-02-27 NOTE — Assessment & Plan Note (Signed)
Remeron  

## 2018-02-27 NOTE — Addendum Note (Signed)
Addended by: Karren Cobble on: 02/27/2018 10:29 AM   Modules accepted: Orders

## 2018-02-27 NOTE — Assessment & Plan Note (Signed)
R mastoid pain CT sinuses if not better Rice bag

## 2018-02-27 NOTE — Assessment & Plan Note (Signed)
BP Readings from Last 3 Encounters:  02/27/18 118/72  02/07/18 122/70  01/24/18 130/74

## 2018-02-27 NOTE — Assessment & Plan Note (Signed)
Vit D 

## 2018-02-28 DIAGNOSIS — Z9181 History of falling: Secondary | ICD-10-CM | POA: Diagnosis not present

## 2018-02-28 DIAGNOSIS — M1991 Primary osteoarthritis, unspecified site: Secondary | ICD-10-CM | POA: Diagnosis not present

## 2018-02-28 DIAGNOSIS — Z7902 Long term (current) use of antithrombotics/antiplatelets: Secondary | ICD-10-CM | POA: Diagnosis not present

## 2018-02-28 DIAGNOSIS — S93492D Sprain of other ligament of left ankle, subsequent encounter: Secondary | ICD-10-CM | POA: Diagnosis not present

## 2018-02-28 DIAGNOSIS — Z8673 Personal history of transient ischemic attack (TIA), and cerebral infarction without residual deficits: Secondary | ICD-10-CM | POA: Diagnosis not present

## 2018-03-05 DIAGNOSIS — S93492D Sprain of other ligament of left ankle, subsequent encounter: Secondary | ICD-10-CM | POA: Diagnosis not present

## 2018-03-05 DIAGNOSIS — Z7902 Long term (current) use of antithrombotics/antiplatelets: Secondary | ICD-10-CM | POA: Diagnosis not present

## 2018-03-05 DIAGNOSIS — Z9181 History of falling: Secondary | ICD-10-CM | POA: Diagnosis not present

## 2018-03-05 DIAGNOSIS — Z8673 Personal history of transient ischemic attack (TIA), and cerebral infarction without residual deficits: Secondary | ICD-10-CM | POA: Diagnosis not present

## 2018-03-05 DIAGNOSIS — M1991 Primary osteoarthritis, unspecified site: Secondary | ICD-10-CM | POA: Diagnosis not present

## 2018-03-06 DIAGNOSIS — M1991 Primary osteoarthritis, unspecified site: Secondary | ICD-10-CM | POA: Diagnosis not present

## 2018-03-06 DIAGNOSIS — Z7902 Long term (current) use of antithrombotics/antiplatelets: Secondary | ICD-10-CM | POA: Diagnosis not present

## 2018-03-06 DIAGNOSIS — Z9181 History of falling: Secondary | ICD-10-CM | POA: Diagnosis not present

## 2018-03-06 DIAGNOSIS — S93492D Sprain of other ligament of left ankle, subsequent encounter: Secondary | ICD-10-CM | POA: Diagnosis not present

## 2018-03-06 DIAGNOSIS — Z8673 Personal history of transient ischemic attack (TIA), and cerebral infarction without residual deficits: Secondary | ICD-10-CM | POA: Diagnosis not present

## 2018-03-07 DIAGNOSIS — H401132 Primary open-angle glaucoma, bilateral, moderate stage: Secondary | ICD-10-CM | POA: Diagnosis not present

## 2018-03-07 DIAGNOSIS — H04123 Dry eye syndrome of bilateral lacrimal glands: Secondary | ICD-10-CM | POA: Diagnosis not present

## 2018-03-07 DIAGNOSIS — Z961 Presence of intraocular lens: Secondary | ICD-10-CM | POA: Diagnosis not present

## 2018-03-07 DIAGNOSIS — H26493 Other secondary cataract, bilateral: Secondary | ICD-10-CM | POA: Diagnosis not present

## 2018-03-07 DIAGNOSIS — H524 Presbyopia: Secondary | ICD-10-CM | POA: Diagnosis not present

## 2018-03-11 DIAGNOSIS — Z9181 History of falling: Secondary | ICD-10-CM | POA: Diagnosis not present

## 2018-03-11 DIAGNOSIS — Z7902 Long term (current) use of antithrombotics/antiplatelets: Secondary | ICD-10-CM | POA: Diagnosis not present

## 2018-03-11 DIAGNOSIS — Z8673 Personal history of transient ischemic attack (TIA), and cerebral infarction without residual deficits: Secondary | ICD-10-CM | POA: Diagnosis not present

## 2018-03-11 DIAGNOSIS — S93492D Sprain of other ligament of left ankle, subsequent encounter: Secondary | ICD-10-CM | POA: Diagnosis not present

## 2018-03-11 DIAGNOSIS — M1991 Primary osteoarthritis, unspecified site: Secondary | ICD-10-CM | POA: Diagnosis not present

## 2018-03-12 ENCOUNTER — Ambulatory Visit: Payer: Medicare Other | Admitting: Sports Medicine

## 2018-03-13 DIAGNOSIS — Z8673 Personal history of transient ischemic attack (TIA), and cerebral infarction without residual deficits: Secondary | ICD-10-CM | POA: Diagnosis not present

## 2018-03-13 DIAGNOSIS — Z7902 Long term (current) use of antithrombotics/antiplatelets: Secondary | ICD-10-CM | POA: Diagnosis not present

## 2018-03-13 DIAGNOSIS — Z9181 History of falling: Secondary | ICD-10-CM | POA: Diagnosis not present

## 2018-03-13 DIAGNOSIS — S93492D Sprain of other ligament of left ankle, subsequent encounter: Secondary | ICD-10-CM | POA: Diagnosis not present

## 2018-03-13 DIAGNOSIS — M1991 Primary osteoarthritis, unspecified site: Secondary | ICD-10-CM | POA: Diagnosis not present

## 2018-03-19 DIAGNOSIS — Z7902 Long term (current) use of antithrombotics/antiplatelets: Secondary | ICD-10-CM | POA: Diagnosis not present

## 2018-03-19 DIAGNOSIS — S93492D Sprain of other ligament of left ankle, subsequent encounter: Secondary | ICD-10-CM | POA: Diagnosis not present

## 2018-03-19 DIAGNOSIS — Z9181 History of falling: Secondary | ICD-10-CM | POA: Diagnosis not present

## 2018-03-19 DIAGNOSIS — Z8673 Personal history of transient ischemic attack (TIA), and cerebral infarction without residual deficits: Secondary | ICD-10-CM | POA: Diagnosis not present

## 2018-03-19 DIAGNOSIS — M1991 Primary osteoarthritis, unspecified site: Secondary | ICD-10-CM | POA: Diagnosis not present

## 2018-03-21 DIAGNOSIS — Z7902 Long term (current) use of antithrombotics/antiplatelets: Secondary | ICD-10-CM | POA: Diagnosis not present

## 2018-03-21 DIAGNOSIS — Z9181 History of falling: Secondary | ICD-10-CM | POA: Diagnosis not present

## 2018-03-21 DIAGNOSIS — Z8673 Personal history of transient ischemic attack (TIA), and cerebral infarction without residual deficits: Secondary | ICD-10-CM | POA: Diagnosis not present

## 2018-03-21 DIAGNOSIS — S93492D Sprain of other ligament of left ankle, subsequent encounter: Secondary | ICD-10-CM | POA: Diagnosis not present

## 2018-03-21 DIAGNOSIS — M1991 Primary osteoarthritis, unspecified site: Secondary | ICD-10-CM | POA: Diagnosis not present

## 2018-03-25 ENCOUNTER — Ambulatory Visit (INDEPENDENT_AMBULATORY_CARE_PROVIDER_SITE_OTHER): Payer: Medicare Other | Admitting: Sports Medicine

## 2018-03-25 ENCOUNTER — Encounter: Payer: Self-pay | Admitting: Sports Medicine

## 2018-03-25 VITALS — BP 138/70 | HR 85 | Ht 65.0 in | Wt 140.4 lb

## 2018-03-25 DIAGNOSIS — I6350 Cerebral infarction due to unspecified occlusion or stenosis of unspecified cerebral artery: Secondary | ICD-10-CM | POA: Diagnosis not present

## 2018-03-25 DIAGNOSIS — R269 Unspecified abnormalities of gait and mobility: Secondary | ICD-10-CM | POA: Diagnosis not present

## 2018-03-25 DIAGNOSIS — M79672 Pain in left foot: Secondary | ICD-10-CM | POA: Diagnosis not present

## 2018-03-25 DIAGNOSIS — R600 Localized edema: Secondary | ICD-10-CM | POA: Diagnosis not present

## 2018-03-25 DIAGNOSIS — S93492A Sprain of other ligament of left ankle, initial encounter: Secondary | ICD-10-CM | POA: Diagnosis not present

## 2018-03-25 NOTE — Progress Notes (Signed)
Juanda Bond. Jaman Aro, [place] y.o. female MRN [id number]  Date of birth: [birth year]  Visit Date: 03/25/2018  PCP: Cassandria Anger, MD   Referred by: Cassandria Anger, MD  Scribe(s) for today's visit: Wendy Poet, LAT, ATC  SUBJECTIVE:  Theresa Hampton is here for Follow-up (L ankle sprain and L foot pain)  HPI 12/25/2017: Her L foot and ankle pain symptoms INITIALLY: Began at 4:30 am today when she was going to the bathroom when her feet slipped out from under her and injured her L ankle. Described as moderate throbbing pain, nonradiating Worsened with weight bearing Improved with pain medication Additional associated symptoms include: swelling noted in the L foot; no N/T   At this time symptoms are improving compared to onset. She has been elevating and using compression.  01/03/2018: Compared to the last office visit, her previously described symptoms are improving. She does note occasional burning sensation in the ankle. She reports that she does have RLS and the burning sensation that she feels is similar to those sx. Sx seem to be worse at night.  Current symptoms are moderate & are nonradiating She has been taking Tramadol with some relief. She is wearing compression hose and ASO brace.   01/24/2018: Compared to the last office visit, her previously described symptoms are improving. The swelling has gone down significantly.  Current symptoms are mild & are non-radiating. She has been taking Tramadol and/or Tylenol with some relief. She has been wearing brace and feels that it is supportive.   02/07/2018: Compared to the last office visit on 01/24/18, her previously described L foot and ankle symptoms are improving w/ decreased pain noted.  Majority of her pain is along the top of the L foot. Current symptoms are moderate burning, intermittent pain along the top of her L foot  & are nonradiating She has been taking Tylenol and 1/2 Tramadol BID.  She's been wearing an ASO brace on her L ankle for weight-bearing activities.  She's not been contacted regarding PT at [facility].  03/25/2018: Compared to the last office visit on 02/07/18, her previously described L lateral ankle and foot symptoms are improving.  She reports no pain w/ ADLs/IADLs.  The only discomfort she has is w/ manual pressure to that area. Current symptoms are none to less to mild & are nonradiating She has been taking Tylenol and 1/2 Tramadol prn.  She wears her ASO brace w/ activity.  She has been doing PT 2x/week and is doing chair exercises 6x/week.  L foot XR - 12/25/17  REVIEW OF SYSTEMS: Reports night time disturbances. Denies fevers, chills, or night sweats. Denies unexplained weight loss. Denies personal history of cancer. Denies changes in bowel or bladder habits. Denies recent unreported falls. Denies new or worsening dyspnea or wheezing. Denies headaches or dizziness.  Denies numbness, tingling or weakness  In the extremities.  Denies dizziness or presyncopal episodes Denies lower extremity edema     HISTORY & PERTINENT PRIOR DATA:  Significant/pertinent history, findings, studies include:  reports that she has never smoked. She has never used smokeless tobacco. Recent Labs    05/07/17 0330  HGBA1C 5.3   No specialty comments available. Problem  Sprain of Anterior Talofibular Ligament of Left Ankle    Otherwise prior history reviewed and updated per electronic medical record.    OBJECTIVE:  VS:  HT:5\' 5"  (165.1 cm)   WT:140  lb 6.4 oz (63.7 kg)  BMI:23.36    BP:138/70  HR:85bpm  TEMP: ( )  RESP:96 %   PHYSICAL EXAM: CONSTITUTIONAL: Well-developed, Well-nourished and In no acute distress Psychiatric: Alert & appropriately interactive. and Not depressed or anxious appearing. RESPIRATORY: No increased work of breathing and Trachea Midline EYES: Pupils are equal.,  EOM intact without nystagmus. and No scleral icterus.  Lower extremities: EXTREMITY EXAM: Warm and well perfused Pulses: DP Pulses: Left 2+/4 PT Pulses: Left 2+/4 Edema: Joint associated swelling as per Orthopedic exam and No Pre-tibial edema NEURO: unremarkable  MSK Exam: Left ankle foot: . Well aligned, no significant deformity. . No overlying skin changes. . No focal bony tenderness . Ligamentously stable to Anterior drawer.  Mild pain but no instability with talar tilting.  . No significant bony tenderness over the medial, lateral malleolus or proximal fifth metatarsal.   ASSESSMENT   1. Sprain of anterior talofibular ligament of left ankle, initial encounter   2. Gait disturbance   3. Localized edema   4. Left foot pain     PLAN:  Pertinent additional documentation may be included in corresponding procedure notes, imaging studies, problem based documentation and patient instructions.  Procedures:  . None  Medications:  No orders of the defined types were placed in this encounter.  Discussion/Instructions: Sprain of anterior talofibular ligament of left ankle She is doing quite well.  She has effectively no swelling or pain at this time.  We will plan to have her continue with the compression socks as needed but it is okay to wean out of these.  She has continue to work physical therapy and I would encourage her to continue this if she continues to show progress.  Otherwise we will plan to have her return to me only as needed.  I will defer further refills on tramadol back to Dr. Alain Marion but it appears that she is only taking 1tablet intermittently half of 1 tablet intermittently with only 1 dose within the last 2 weeks.  .   .  Continue with physical therapy at [facility] per their discretion. . Continue tramadol p.o. half a tablet twice daily.  PRN . Discussed red flag symptoms that warrant earlier emergent evaluation and patient voices understanding. . Activity  modifications and the importance of avoiding exacerbating activities (limiting pain to no more than a 4 / 10 during or following activity) recommended and discussed.  Follow-up:  . No follow-ups on file.  Recheck clinically.  Plan to likely be able to discharge from care at that time.        CMA/ATC served as Education administrator during this visit. History, Physical, and Plan performed by medical provider. Documentation and orders reviewed and attested to.      Gerda Diss, [facility] Physician

## 2018-03-25 NOTE — Assessment & Plan Note (Signed)
She is doing quite well.  She has effectively no swelling or pain at this time.  We will plan to have her continue with the compression socks as needed but it is okay to wean out of these.  She has continue to work physical therapy and I would encourage her to continue this if she continues to show progress.  Otherwise we will plan to have her return to me only as needed.  I will defer further refills on tramadol back to Dr. Alain Marion but it appears that she is only taking 1tablet intermittently half of 1 tablet intermittently with only 1 dose within the last 2 weeks.

## 2018-03-26 DIAGNOSIS — Z9181 History of falling: Secondary | ICD-10-CM | POA: Diagnosis not present

## 2018-03-26 DIAGNOSIS — M1991 Primary osteoarthritis, unspecified site: Secondary | ICD-10-CM | POA: Diagnosis not present

## 2018-03-26 DIAGNOSIS — S93492D Sprain of other ligament of left ankle, subsequent encounter: Secondary | ICD-10-CM | POA: Diagnosis not present

## 2018-03-26 DIAGNOSIS — Z7902 Long term (current) use of antithrombotics/antiplatelets: Secondary | ICD-10-CM | POA: Diagnosis not present

## 2018-03-26 DIAGNOSIS — Z8673 Personal history of transient ischemic attack (TIA), and cerebral infarction without residual deficits: Secondary | ICD-10-CM | POA: Diagnosis not present

## 2018-04-08 DIAGNOSIS — R2681 Unsteadiness on feet: Secondary | ICD-10-CM | POA: Diagnosis not present

## 2018-04-08 DIAGNOSIS — M6281 Muscle weakness (generalized): Secondary | ICD-10-CM | POA: Diagnosis not present

## 2018-04-08 DIAGNOSIS — R262 Difficulty in walking, not elsewhere classified: Secondary | ICD-10-CM | POA: Diagnosis not present

## 2018-04-08 DIAGNOSIS — Z9181 History of falling: Secondary | ICD-10-CM | POA: Diagnosis not present

## 2018-04-09 DIAGNOSIS — R262 Difficulty in walking, not elsewhere classified: Secondary | ICD-10-CM | POA: Diagnosis not present

## 2018-04-09 DIAGNOSIS — R2681 Unsteadiness on feet: Secondary | ICD-10-CM | POA: Diagnosis not present

## 2018-04-09 DIAGNOSIS — Z9181 History of falling: Secondary | ICD-10-CM | POA: Diagnosis not present

## 2018-04-09 DIAGNOSIS — M6281 Muscle weakness (generalized): Secondary | ICD-10-CM | POA: Diagnosis not present

## 2018-04-12 DIAGNOSIS — Z9181 History of falling: Secondary | ICD-10-CM | POA: Diagnosis not present

## 2018-04-12 DIAGNOSIS — M6281 Muscle weakness (generalized): Secondary | ICD-10-CM | POA: Diagnosis not present

## 2018-04-12 DIAGNOSIS — R2681 Unsteadiness on feet: Secondary | ICD-10-CM | POA: Diagnosis not present

## 2018-04-12 DIAGNOSIS — R262 Difficulty in walking, not elsewhere classified: Secondary | ICD-10-CM | POA: Diagnosis not present

## 2018-04-15 DIAGNOSIS — M6281 Muscle weakness (generalized): Secondary | ICD-10-CM | POA: Diagnosis not present

## 2018-04-15 DIAGNOSIS — Z9181 History of falling: Secondary | ICD-10-CM | POA: Diagnosis not present

## 2018-04-15 DIAGNOSIS — R2681 Unsteadiness on feet: Secondary | ICD-10-CM | POA: Diagnosis not present

## 2018-04-15 DIAGNOSIS — R262 Difficulty in walking, not elsewhere classified: Secondary | ICD-10-CM | POA: Diagnosis not present

## 2018-04-17 DIAGNOSIS — R2681 Unsteadiness on feet: Secondary | ICD-10-CM | POA: Diagnosis not present

## 2018-04-17 DIAGNOSIS — Z9181 History of falling: Secondary | ICD-10-CM | POA: Diagnosis not present

## 2018-04-17 DIAGNOSIS — R262 Difficulty in walking, not elsewhere classified: Secondary | ICD-10-CM | POA: Diagnosis not present

## 2018-04-17 DIAGNOSIS — M6281 Muscle weakness (generalized): Secondary | ICD-10-CM | POA: Diagnosis not present

## 2018-04-19 DIAGNOSIS — M6281 Muscle weakness (generalized): Secondary | ICD-10-CM | POA: Diagnosis not present

## 2018-04-19 DIAGNOSIS — R2681 Unsteadiness on feet: Secondary | ICD-10-CM | POA: Diagnosis not present

## 2018-04-19 DIAGNOSIS — Z9181 History of falling: Secondary | ICD-10-CM | POA: Diagnosis not present

## 2018-04-19 DIAGNOSIS — R262 Difficulty in walking, not elsewhere classified: Secondary | ICD-10-CM | POA: Diagnosis not present

## 2018-04-22 DIAGNOSIS — Z9181 History of falling: Secondary | ICD-10-CM | POA: Diagnosis not present

## 2018-04-22 DIAGNOSIS — R2681 Unsteadiness on feet: Secondary | ICD-10-CM | POA: Diagnosis not present

## 2018-04-22 DIAGNOSIS — M6281 Muscle weakness (generalized): Secondary | ICD-10-CM | POA: Diagnosis not present

## 2018-04-22 DIAGNOSIS — R262 Difficulty in walking, not elsewhere classified: Secondary | ICD-10-CM | POA: Diagnosis not present

## 2018-04-25 DIAGNOSIS — R262 Difficulty in walking, not elsewhere classified: Secondary | ICD-10-CM | POA: Diagnosis not present

## 2018-04-25 DIAGNOSIS — M6281 Muscle weakness (generalized): Secondary | ICD-10-CM | POA: Diagnosis not present

## 2018-04-25 DIAGNOSIS — Z9181 History of falling: Secondary | ICD-10-CM | POA: Diagnosis not present

## 2018-04-25 DIAGNOSIS — R2681 Unsteadiness on feet: Secondary | ICD-10-CM | POA: Diagnosis not present

## 2018-04-26 DIAGNOSIS — Z9181 History of falling: Secondary | ICD-10-CM | POA: Diagnosis not present

## 2018-04-26 DIAGNOSIS — M6281 Muscle weakness (generalized): Secondary | ICD-10-CM | POA: Diagnosis not present

## 2018-04-26 DIAGNOSIS — R262 Difficulty in walking, not elsewhere classified: Secondary | ICD-10-CM | POA: Diagnosis not present

## 2018-04-26 DIAGNOSIS — R2681 Unsteadiness on feet: Secondary | ICD-10-CM | POA: Diagnosis not present

## 2018-04-29 DIAGNOSIS — R262 Difficulty in walking, not elsewhere classified: Secondary | ICD-10-CM | POA: Diagnosis not present

## 2018-04-29 DIAGNOSIS — R2681 Unsteadiness on feet: Secondary | ICD-10-CM | POA: Diagnosis not present

## 2018-04-29 DIAGNOSIS — Z9181 History of falling: Secondary | ICD-10-CM | POA: Diagnosis not present

## 2018-04-29 DIAGNOSIS — M6281 Muscle weakness (generalized): Secondary | ICD-10-CM | POA: Diagnosis not present

## 2018-05-02 DIAGNOSIS — Z9181 History of falling: Secondary | ICD-10-CM | POA: Diagnosis not present

## 2018-05-02 DIAGNOSIS — R2681 Unsteadiness on feet: Secondary | ICD-10-CM | POA: Diagnosis not present

## 2018-05-02 DIAGNOSIS — R262 Difficulty in walking, not elsewhere classified: Secondary | ICD-10-CM | POA: Diagnosis not present

## 2018-05-02 DIAGNOSIS — M6281 Muscle weakness (generalized): Secondary | ICD-10-CM | POA: Diagnosis not present

## 2018-05-07 DIAGNOSIS — R262 Difficulty in walking, not elsewhere classified: Secondary | ICD-10-CM | POA: Diagnosis not present

## 2018-05-07 DIAGNOSIS — R2681 Unsteadiness on feet: Secondary | ICD-10-CM | POA: Diagnosis not present

## 2018-05-07 DIAGNOSIS — M6281 Muscle weakness (generalized): Secondary | ICD-10-CM | POA: Diagnosis not present

## 2018-05-07 DIAGNOSIS — Z9181 History of falling: Secondary | ICD-10-CM | POA: Diagnosis not present

## 2018-05-10 DIAGNOSIS — Z9181 History of falling: Secondary | ICD-10-CM | POA: Diagnosis not present

## 2018-05-10 DIAGNOSIS — R262 Difficulty in walking, not elsewhere classified: Secondary | ICD-10-CM | POA: Diagnosis not present

## 2018-05-10 DIAGNOSIS — R2681 Unsteadiness on feet: Secondary | ICD-10-CM | POA: Diagnosis not present

## 2018-05-10 DIAGNOSIS — M6281 Muscle weakness (generalized): Secondary | ICD-10-CM | POA: Diagnosis not present

## 2018-05-13 DIAGNOSIS — R262 Difficulty in walking, not elsewhere classified: Secondary | ICD-10-CM | POA: Diagnosis not present

## 2018-05-13 DIAGNOSIS — R2681 Unsteadiness on feet: Secondary | ICD-10-CM | POA: Diagnosis not present

## 2018-05-13 DIAGNOSIS — M6281 Muscle weakness (generalized): Secondary | ICD-10-CM | POA: Diagnosis not present

## 2018-05-13 DIAGNOSIS — Z9181 History of falling: Secondary | ICD-10-CM | POA: Diagnosis not present

## 2018-05-14 ENCOUNTER — Telehealth: Payer: Self-pay | Admitting: Adult Health

## 2018-05-14 NOTE — Telephone Encounter (Signed)
FYI Pt daughter(on DPR-White,Ann) has called to inform that she was not aware of a check in of 30 mins. Ahead of appointment time.  Daughter states the facility will bring pt as promptly as possible.  No call back requested

## 2018-05-15 ENCOUNTER — Encounter: Payer: Self-pay | Admitting: Adult Health

## 2018-05-15 ENCOUNTER — Ambulatory Visit (INDEPENDENT_AMBULATORY_CARE_PROVIDER_SITE_OTHER): Payer: Medicare Other | Admitting: Adult Health

## 2018-05-15 VITALS — BP 127/68 | HR 92 | Ht 65.0 in

## 2018-05-15 DIAGNOSIS — I10 Essential (primary) hypertension: Secondary | ICD-10-CM

## 2018-05-15 DIAGNOSIS — E785 Hyperlipidemia, unspecified: Secondary | ICD-10-CM | POA: Diagnosis not present

## 2018-05-15 DIAGNOSIS — I6350 Cerebral infarction due to unspecified occlusion or stenosis of unspecified cerebral artery: Secondary | ICD-10-CM

## 2018-05-15 NOTE — Patient Instructions (Signed)
Continue clopidogrel 75 mg daily  and lipitor  for secondary stroke prevention  Continue to follow up with PCP regarding cholesterol and blood pressure management   Continue therapies and continue to stay active as tolerated  Continue to monitor blood pressure at home  Maintain strict control of hypertension with blood pressure goal below 130/90, diabetes with hemoglobin A1c goal below 6.5% and cholesterol with LDL cholesterol (bad cholesterol) goal below 70 mg/dL. I also advised the patient to eat a healthy diet with plenty of whole grains, cereals, fruits and vegetables, exercise regularly and maintain ideal body weight.  Followup in the future with me as needed or call earlier if needed       Thank you for coming to see [place] at [facility]. I hope we have been able to provide you high quality care today.  You may receive a patient satisfaction survey over the next few weeks. We would appreciate your feedback and comments so that we may continue to improve ourselves and the health of our patients.

## 2018-05-15 NOTE — Progress Notes (Signed)
[facility] [address] [place]. [place] [address] [phone]       OFFICE FOLLOW UP NOTE  [name] Date of Birth:  1919-12-28 Medical Record Number:  [id number]   Reason for Referral:  Hospital stroke follow up  CHIEF COMPLAINT:  Chief Complaint  Patient presents with  . Follow-up    Stroke follow up room 9 pt with daughter Lelon Frohlich pt is at [facility]  at [place]    HPI: Theresa Hampton is being seen today in the office for acute/subacute nonhemorrhagic linear infarct right paramedian pons on 05/06/17. History obtained from patient and chart review. Reviewed all radiology images and labs personally.  [name] is a 85 y.o. female with PMH of hypertension hyperlipidemia peptic ulcer disease vitamin D and B12 deficiency with 2 days worth of left facial droop and falls. On examination she had left facial droop, mild dysarthria and mild left hemiparesis.  CT head reviewed and negative for acute intracranial abnormality.  MRI reviewed and showed acute/subacute nonhemorrhagic linear infarct involving the right paramedian pons which does correspond with the patient's left-sided weakness and likely etiology small vessel disease.  MRA of the head and neck is negative for showed mild stenosis in the right PCA, origin of the carotid artery bilaterally and moderate stenosis in the left superior cerebral artery and the origin of the right vertebral artery.  The basilar artery was widely patent.  2D echo showed an EF of 55-60%.  Patient was taking aspirin 81 mg PTA but was switched to Plavix 75 mg daily at discharge.  LDL 133 and a started on Lipitor 40 mg daily. A1c 5.3.  Patient was accepted to [facility] for continued therapies.  Patient was discharged to [facility] (assisted living facility) in stable condition.  Since discharge, patient has been doing well.  She is accompanied today by her daughter.  Patient continues to take Plavix without side effects of  increased bleeding or bruising.  Patient continues to take Lipitor without side effects of myalgias.  Patient continues to work with PT OT [facility], assisted living facility.  Patient's blood pressure has been satisfactory and at today's visit 120/63.  Patient feels as though most of her symptoms have improved.  Denies memory complaints.  Patient does have complaints of urination and urgency.  Denies pain, hematuria, or fevers.  This has been occurring for proximally 1 week.  Patient was advised to follow-up with PCP regarding this issue who can obtain cultures and treat if necessary.  Denies new or worsening stroke/TIA symptoms.  11/12/17 UPDATE: Patient returns today for 29-month follow-up and is accompanied by her daughter.  Overall she is doing well.  She continues to take Plavix without side effects of bleeding or bruising.  Continues to take Lipitor without side effects myalgias.  Blood pressure satisfactory 116/62.  States that she no longer has left-sided weakness and continues to do exercises at [facility] where she is currently residing.  She has completed all previous therapies but does continue to use rolling walker for short ambulation with assistance and wheelchair for long distance.  Patient does have concerns of increased lower extremity deconditioning due to infrequency of being able to ambulate due to needing assistance.  Patient and daughter both wondering if patient can be released to ambulate independently.  Denies new or worsening stroke/TIA symptoms.  Interval history 05/15/2018: Patient is being seen today for 54-month follow-up and is accompanied by her daughter.  She is participating  in physical therapy at her current facility [facility] to help strengthen muscles, balance and left leg strengthening after recent ankle sprain. She continues to use RW for ambulation. She continues to be unable to ambulate independently and is frustrated by this as she states she would walk more  frequently if she didn't have to wait for nursing staff to assist her. She continues to take Plavix without side effects of bleeding or bruising.  Continues to take atorvastatin without side effects myalgias.  Blood pressure today 127/68. No further concerns at this time.  Denies new or worsening stroke/TIA symptoms.     ROS:   14 system review of systems performed and negative with exception of aching muscles and hearing loss  PMH:  Past Medical History:  Diagnosis Date  . Allergic rhinitis   . Arthritis    "hips" (05/08/2017)  . B12 deficiency anemia   . Chronic sinusitis   . Daily headache    "q am" (05/08/2017)  . History of transfusion 2004   "w/1st hip OR" (05/08/2017)  . Hyperlipidemia   . Hypertension   . Iron deficiency anemia   . Leg cramps   . Osteoarthritis    Dr. Maureen Ralphs  . Osteoporosis   . Peptic ulcer disease 2003  . PONV (postoperative nausea and vomiting)   . Pyelonephritis, acute 2014  . Seasonal allergies   . Seizures (HCC)    x1 with Lidocaine  . Stroke ([place]) 05/05/2017  . Vitamin B12 deficiency   . Vitamin D deficiency     PSH:  Past Surgical History:  Procedure Laterality Date  . CATARACT EXTRACTION, BILATERAL Bilateral   . DILATION AND CURETTAGE OF UTERUS    . ENDOMETRIAL ABLATION  2008  . JOINT REPLACEMENT    . THYROIDECTOMY, PARTIAL  "many years ago"  . TOTAL HIP ARTHROPLASTY Left 01/06/2015   Procedure: LEFT TOTAL HIP ARTHROPLASTY ANTERIOR APPROACH;  Surgeon: Gaynelle Arabian, MD;  Location: [facility];  Service: Orthopedics;  Laterality: Left;  . TOTAL HIP ARTHROPLASTY Right 2004    Social History:  Social History   Socioeconomic History  . Marital status: Widowed    Spouse name: Not on file  . Number of children: Not on file  . Years of education: Not on file  . Highest education level: Not on file  Occupational History  . Occupation: retired  Scientific laboratory technician  . Financial resource strain: Not on file  . Food insecurity:    Worry: Not on file      Inability: Not on file  . Transportation needs:    Medical: Not on file    Non-medical: Not on file  Tobacco Use  . Smoking status: Never Smoker  . Smokeless tobacco: Never Used  Substance and Sexual Activity  . Alcohol use: No  . Drug use: No  . Sexual activity: Not Currently  Lifestyle  . Physical activity:    Days per week: Not on file    Minutes per session: Not on file  . Stress: Not on file  Relationships  . Social connections:    Talks on phone: Not on file    Gets together: Not on file    Attends religious service: Not on file    Active member of club or organization: Not on file    Attends meetings of clubs or organizations: Not on file    Relationship status: Not on file  . Intimate partner violence:    Fear of current or ex partner: Not on file  Emotionally abused: Not on file    Physically abused: Not on file    Forced sexual activity: Not on file  Other Topics Concern  . Not on file  Social History Narrative  . Not on file    Family History:  Family History  Problem Relation Age of Onset  . Arthritis Mother   . Hypertension Other   . Cancer Sister     Medications:   Current Outpatient Medications on File Prior to Visit  Medication Sig Dispense Refill  . acetaminophen (TYLENOL) 325 MG tablet Take 2 tablets (650 mg total) by mouth every 6 (six) hours as needed.    Marland Kitchen atorvastatin (LIPITOR) 40 MG tablet Take 1 tablet (40 mg total) by mouth daily at 6 PM. 30 tablet 0  . cholecalciferol (VITAMIN D) 400 units TABS tablet Take 400 Units by mouth.    . CLONAZEPAM PO Take 0.25 mg by mouth as needed.    . clopidogrel (PLAVIX) 75 MG tablet Take 1 tablet (75 mg total) by mouth daily. 30 tablet 0  . diclofenac sodium (VOLTAREN) 1 % GEL Apply 2 g topically 4 (four) times daily. 4 Tube 0  . fluticasone (FLONASE) 50 MCG/ACT nasal spray Place into both nostrils daily.    . furosemide (LASIX) 20 MG tablet Take 1 tablet (20 mg total) by mouth daily. 30 tablet 5  .  gabapentin (NEURONTIN) 100 MG capsule TAKE 1 CAPSULE BY MOUTH AT BEDTIME 30 capsule 1  . loratadine (CLARITIN) 10 MG tablet Take 1 tablet (10 mg total) by mouth daily. 100 tablet 2  . LUMIGAN 0.01 % SOLN instill 1 drop IN [facility] EYE AT BEDTIME 7.5 mL 0  . Menthol-Methyl Salicylate (MUSCLE RUB) 10-15 % CREA Apply 1 application topically 2 (two) times daily as needed for muscle pain (BACK PAIN).  0  . mirtazapine (REMERON) 15 MG tablet TAKE ONE TABLET BY MOUTH EVERY EVENING BEFORE DINNER AT 4 - 5 PM 30 tablet 1  . Multiple Vitamins-Iron (MULTIVITAMINS WITH IRON) TABS tablet Take 1 tablet by mouth daily.  0  . potassium chloride (KLOR-CON 10) 10 MEQ tablet Take 1 tablet (10 mEq total) by mouth daily. 30 tablet 5  . senna-docusate (SENOKOT-S) 8.6-50 MG tablet Take 1 tablet by mouth at bedtime as needed for moderate constipation. 30 tablet 0  . tolterodine (DETROL LA) 4 MG 24 hr capsule Take 1 capsule (4 mg total) by mouth daily. 30 capsule 11  . traMADol (ULTRAM) 50 MG tablet Take 0.5 tablets (25 mg total) by mouth every 12 (twelve) hours as needed for moderate pain or severe pain. 30 tablet 1  . traZODone (DESYREL) 50 MG tablet Take 0.5 tablets (25 mg total) by mouth at bedtime. 15 tablet 3  . vitamin B-12 (CYANOCOBALAMIN) 100 MCG tablet Take 100 mcg by mouth daily.     No current facility-administered medications on file prior to visit.     Allergies:   Allergies  Allergen Reactions  . Lidocaine Hcl     seizure  . Amlodipine     Leg swelling even with a low dose     Physical Exam  Vitals:   05/15/18 0911  BP: 127/68  Pulse: 92  Height: 5\' 5"  (1.651 m)   Body mass index is 23.36 kg/m. No exam data present  General:  frail pleasant elderly Caucasian female, seated, in no evident distress Head: head normocephalic and atraumatic.   Neck: supple with no carotid or supraclavicular bruits Cardiovascular: regular rate and rhythm,  no murmurs Musculoskeletal: no deformity Skin:  no  rash/petichiae Vascular:  Normal pulses all extremities  Neurologic Exam Mental Status: Awake and fully alert.  Oriented to place and time. Remote memory intact.    Attention span, concentration and fund of knowledge appropriate. Mood and affect appropriate.  Cranial Nerves: Fundoscopic exam not tested.  Pupils equal, briskly reactive to light. Extraocular movements full without nystagmus. Visual fields full to confrontation. Hearing intact. Facial sensation intact.  Mild left facial asymmetry. Motor: Normal bulk and tone.  Normal strength in all tested extremities Sensory.: intact to touch , pinprick , position and vibratory sensation.  Coordination: Equal dexterity bilateral upper extremities Gait and Station: Patient currently sitting in wheelchair due to transportation method for SNF and does not currently have rolling walker present at appointment therefore gait assessment deferred Reflexes: 1+ and symmetric. Toes downgoing.      Diagnostic Data (Labs, Imaging, Testing)  Dg Sacrum/coccyx Result Date: 05/06/2017 IMPRESSION: Distal sacral fracture of uncertain age.   Mr Brain Wo Contrast Result Date: 05/06/2017 IMPRESSION: 1. Acute/subacute nonhemorrhagic linear infarct involving the right paramedian pons, corresponding with the patient's left-sided weakness. 2. No acute supratentorial infarct. 3. Advanced atrophy and diffuse white matter disease likely reflects the sequela of chronic microvascular ischemia. 4. Moderate paranasal sinus disease, predominantly involving the ethmoids.   Mr [facility] and Neck Wo Contrast Result Date: 05/06/2017 IMPRESSION: Mild stenosis at the origin of the carotid artery bilaterally Moderate stenosis at the origin of the right vertebral artery. Origin of left vertebral not included on the study. Moderate stenosis left superior cerebellar artery. Mild stenosis right posterior cerebral artery. Basilar artery widely patent.   Ct Head, Maxillofacial and  Cervical Wo Contrast Result Date: 05/06/2017 IMPRESSION:  Head CT: CT negative for acute intracranial abnormality. Evidence of chronic microvascular ischemic disease.  Maxillofacial CT: No acute facial fracture. Soft tissue swelling in the left supraorbital scalp without underlying fracture. No radiopaque foreign body. Moderate paranasal sinus disease predominantly involving the ethmoid air cells.  Cervical CT: CT negative for acute fracture malalignment of the cervical spine. Greatest degree of disc disease present at C5-C6 with associated facet disease resulting in mild a moderate left-sided foraminal narrowing. Trace anterolisthesis of C3 on C4 and C4 on C5, likely degenerative. Carotid calcifications.   Echocardiogram:                                               Study Conclusions - Left ventricle: The cavity size was normal. There was mild concentric hypertrophy. Systolic function was normal. The estimated ejection fraction was in the range of 55% to 60%. Wall motion was normal; there were no regional wall motion abnormalities. Doppler parameters are consistent with abnormal left ventricular relaxation (grade 1 diastolic dysfunction). There was no evidence of elevated ventricular filling pressure by Doppler parameters. - Aortic valve: Trileaflet; moderately thickened, severely calcified leaflets. There was mild stenosis. Peak gradient (S): 12 mm Hg. Valve area (Vmax): 1.16 cm^2. - Aortic root: The aortic root was normal in size. - Mitral valve: There was mild regurgitation. - Right ventricle: The cavity size was normal. Wall thickness was normal. Systolic function was normal. - Tricuspid valve: There was moderate regurgitation. - Pulmonic valve: There was trivial regurgitation. - Pulmonary arteries: Systolic pressure was moderately increased. PA peak pressure: 43 mm Hg (S). - Inferior vena cava: The vessel  was normal in size. - Pericardium, extracardiac:  There was no pericardial effusion.     ASSESSMENT: Theresa Hampton is a 85 y.o. year old female here with acute/subacute infarct in the right paramedian pons on 05/06/2017 secondary to small vessel disease.  Vascular risk factors include hypertension and hyperlipidemia.  Patient is being seen today for follow-up visit and overall has continued to do well without residual deficit or recurring of symptoms.    PLAN: -Continue clopidogrel 75 mg daily  and lipitor 40mg   for secondary stroke prevention -Follow up with PCP regarding HTN and HLD -Highly encouraged continuation of therapy participations for continuing strengthening and balance -Continue to monitor blood pressure Maintain strict control of hypertension with blood pressure goal below 130/90, diabetes with hemoglobin A1c goal below 6.5% and cholesterol with LDL cholesterol (bad cholesterol) goal below 70 mg/dL. I also advised the patient to eat a healthy diet with plenty of whole grains, cereals, fruits and vegetables, exercise regularly and maintain ideal body weight.  As patient stable from stroke standpoint which occurred over 1 year ago, was recommended to follow-up as needed but did advise to call with questions, concerns or need of future follow-up appointment.  Daughter and patient are in agreement to this.   Greater than 50% of time during this 25  minute visit was spent on counseling,explanation of diagnosis, planning of further management, discussion with patient and family and coordination of care.     Venancio Poisson, AGNP-BC  [facility] [address] [place], [place] [address]  Phone [phone] Fax [phone]

## 2018-05-16 DIAGNOSIS — R2681 Unsteadiness on feet: Secondary | ICD-10-CM | POA: Diagnosis not present

## 2018-05-16 DIAGNOSIS — Z9181 History of falling: Secondary | ICD-10-CM | POA: Diagnosis not present

## 2018-05-16 DIAGNOSIS — R262 Difficulty in walking, not elsewhere classified: Secondary | ICD-10-CM | POA: Diagnosis not present

## 2018-05-16 DIAGNOSIS — M6281 Muscle weakness (generalized): Secondary | ICD-10-CM | POA: Diagnosis not present

## 2018-05-20 DIAGNOSIS — M6281 Muscle weakness (generalized): Secondary | ICD-10-CM | POA: Diagnosis not present

## 2018-05-20 DIAGNOSIS — R262 Difficulty in walking, not elsewhere classified: Secondary | ICD-10-CM | POA: Diagnosis not present

## 2018-05-20 DIAGNOSIS — R2681 Unsteadiness on feet: Secondary | ICD-10-CM | POA: Diagnosis not present

## 2018-05-20 DIAGNOSIS — Z9181 History of falling: Secondary | ICD-10-CM | POA: Diagnosis not present

## 2018-05-22 DIAGNOSIS — Z9181 History of falling: Secondary | ICD-10-CM | POA: Diagnosis not present

## 2018-05-22 DIAGNOSIS — M6281 Muscle weakness (generalized): Secondary | ICD-10-CM | POA: Diagnosis not present

## 2018-05-22 DIAGNOSIS — R262 Difficulty in walking, not elsewhere classified: Secondary | ICD-10-CM | POA: Diagnosis not present

## 2018-05-22 DIAGNOSIS — R2681 Unsteadiness on feet: Secondary | ICD-10-CM | POA: Diagnosis not present

## 2018-05-24 NOTE — Progress Notes (Signed)
I agree with above 

## 2018-05-24 NOTE — Progress Notes (Signed)
I agree with the above plan 

## 2018-05-28 DIAGNOSIS — Z9181 History of falling: Secondary | ICD-10-CM | POA: Diagnosis not present

## 2018-05-28 DIAGNOSIS — M6281 Muscle weakness (generalized): Secondary | ICD-10-CM | POA: Diagnosis not present

## 2018-05-28 DIAGNOSIS — R2681 Unsteadiness on feet: Secondary | ICD-10-CM | POA: Diagnosis not present

## 2018-05-28 DIAGNOSIS — R262 Difficulty in walking, not elsewhere classified: Secondary | ICD-10-CM | POA: Diagnosis not present

## 2018-05-30 DIAGNOSIS — R262 Difficulty in walking, not elsewhere classified: Secondary | ICD-10-CM | POA: Diagnosis not present

## 2018-05-30 DIAGNOSIS — Z9181 History of falling: Secondary | ICD-10-CM | POA: Diagnosis not present

## 2018-05-30 DIAGNOSIS — M6281 Muscle weakness (generalized): Secondary | ICD-10-CM | POA: Diagnosis not present

## 2018-05-30 DIAGNOSIS — R2681 Unsteadiness on feet: Secondary | ICD-10-CM | POA: Diagnosis not present

## 2018-07-04 ENCOUNTER — Encounter: Payer: Self-pay | Admitting: Internal Medicine

## 2018-07-04 ENCOUNTER — Ambulatory Visit (INDEPENDENT_AMBULATORY_CARE_PROVIDER_SITE_OTHER): Payer: Medicare Other | Admitting: Internal Medicine

## 2018-07-04 ENCOUNTER — Other Ambulatory Visit (INDEPENDENT_AMBULATORY_CARE_PROVIDER_SITE_OTHER): Payer: Medicare Other

## 2018-07-04 DIAGNOSIS — E538 Deficiency of other specified B group vitamins: Secondary | ICD-10-CM

## 2018-07-04 DIAGNOSIS — I10 Essential (primary) hypertension: Secondary | ICD-10-CM

## 2018-07-04 DIAGNOSIS — I6350 Cerebral infarction due to unspecified occlusion or stenosis of unspecified cerebral artery: Secondary | ICD-10-CM

## 2018-07-04 DIAGNOSIS — I6302 Cerebral infarction due to thrombosis of basilar artery: Secondary | ICD-10-CM | POA: Diagnosis not present

## 2018-07-04 DIAGNOSIS — E559 Vitamin D deficiency, unspecified: Secondary | ICD-10-CM

## 2018-07-04 DIAGNOSIS — G479 Sleep disorder, unspecified: Secondary | ICD-10-CM

## 2018-07-04 LAB — HEPATIC FUNCTION PANEL
ALK PHOS: 88 U/L (ref 39–117)
ALT: 14 U/L (ref 0–35)
AST: 18 U/L (ref 0–37)
Albumin: 3.9 g/dL (ref 3.5–5.2)
BILIRUBIN DIRECT: 0.1 mg/dL (ref 0.0–0.3)
BILIRUBIN TOTAL: 0.5 mg/dL (ref 0.2–1.2)
TOTAL PROTEIN: 7.2 g/dL (ref 6.0–8.3)

## 2018-07-04 LAB — CBC WITH DIFFERENTIAL/PLATELET
BASOS PCT: 0.6 % (ref 0.0–3.0)
Basophils Absolute: 0 10*3/uL (ref 0.0–0.1)
EOS ABS: 0.2 10*3/uL (ref 0.0–0.7)
Eosinophils Relative: 3.3 % (ref 0.0–5.0)
HEMATOCRIT: 34.7 % — AB (ref 36.0–46.0)
HEMOGLOBIN: 12 g/dL (ref 12.0–15.0)
Lymphocytes Relative: 13.2 % (ref 12.0–46.0)
Lymphs Abs: 1 10*3/uL (ref 0.7–4.0)
MCHC: 34.5 g/dL (ref 30.0–36.0)
MCV: 84.9 fl (ref 78.0–100.0)
MONO ABS: 0.8 10*3/uL (ref 0.1–1.0)
Monocytes Relative: 10.2 % (ref 3.0–12.0)
Neutro Abs: 5.5 10*3/uL (ref 1.4–7.7)
Neutrophils Relative %: 72.7 % (ref 43.0–77.0)
Platelets: 242 10*3/uL (ref 150.0–400.0)
RBC: 4.09 Mil/uL (ref 3.87–5.11)
RDW: 13.1 % (ref 11.5–15.5)
WBC: 7.5 10*3/uL (ref 4.0–10.5)

## 2018-07-04 LAB — BASIC METABOLIC PANEL
BUN: 15 mg/dL (ref 6–23)
CHLORIDE: 102 meq/L (ref 96–112)
CO2: 29 meq/L (ref 19–32)
CREATININE: 1.1 mg/dL (ref 0.40–1.20)
Calcium: 9.5 mg/dL (ref 8.4–10.5)
GFR: 46.04 mL/min — ABNORMAL LOW (ref >60.00)
Glucose, Bld: 96 mg/dL (ref 70–99)
Potassium: 4.3 mEq/L (ref 3.5–5.1)
Sodium: 139 mEq/L (ref 135–145)

## 2018-07-04 LAB — TSH: TSH: 2.99 u[IU]/mL (ref 0.35–4.50)

## 2018-07-04 NOTE — Assessment & Plan Note (Signed)
On B12 

## 2018-07-04 NOTE — Assessment & Plan Note (Signed)
On Lopressor and Norvasc

## 2018-07-04 NOTE — Assessment & Plan Note (Signed)
L facial droop - better, ataxia 1/19 In a w/c. Using a walker, w/c Plavix

## 2018-07-04 NOTE — Progress Notes (Signed)
Subjective:  Patient ID: Theresa Hampton, female    DOB: May 31, 1919  Age: 85 y.o. MRN: [id number]  CC: No chief complaint on file.   HPI Theresa Hampton presents for CVA, depression, OA f/u  Outpatient Medications Prior to Visit  Medication Sig Dispense Refill  . acetaminophen (TYLENOL) 325 MG tablet Take 2 tablets (650 mg total) by mouth every 6 (six) hours as needed.    Marland Kitchen atorvastatin (LIPITOR) 40 MG tablet Take 1 tablet (40 mg total) by mouth daily at 6 PM. 30 tablet 0  . cholecalciferol (VITAMIN D) 400 units TABS tablet Take 400 Units by mouth.    . CLONAZEPAM PO Take 0.25 mg by mouth as needed.    . clopidogrel (PLAVIX) 75 MG tablet Take 1 tablet (75 mg total) by mouth daily. 30 tablet 0  . diclofenac sodium (VOLTAREN) 1 % GEL Apply 2 g topically 4 (four) times daily. 4 Tube 0  . fluticasone (FLONASE) 50 MCG/ACT nasal spray Place into both nostrils daily.    . furosemide (LASIX) 20 MG tablet Take 1 tablet (20 mg total) by mouth daily. 30 tablet 5  . gabapentin (NEURONTIN) 100 MG capsule TAKE 1 CAPSULE BY MOUTH AT BEDTIME 30 capsule 1  . loratadine (CLARITIN) 10 MG tablet Take 1 tablet (10 mg total) by mouth daily. 100 tablet 2  . LUMIGAN 0.01 % SOLN instill 1 drop IN [facility] EYE AT BEDTIME 7.5 mL 0  . Menthol-Methyl Salicylate (MUSCLE RUB) 10-15 % CREA Apply 1 application topically 2 (two) times daily as needed for muscle pain (BACK PAIN).  0  . mirtazapine (REMERON) 15 MG tablet TAKE ONE TABLET BY MOUTH EVERY EVENING BEFORE DINNER AT 4 - 5 PM 30 tablet 1  . Multiple Vitamins-Iron (MULTIVITAMINS WITH IRON) TABS tablet Take 1 tablet by mouth daily.  0  . potassium chloride (KLOR-CON 10) 10 MEQ tablet Take 1 tablet (10 mEq total) by mouth daily. 30 tablet 5  . senna-docusate (SENOKOT-S) 8.6-50 MG tablet Take 1 tablet by mouth at bedtime as needed for moderate constipation. 30 tablet 0  . tolterodine (DETROL LA) 4 MG 24 hr capsule Take 1 capsule (4 mg total) by mouth daily. 30 capsule  11  . traMADol (ULTRAM) 50 MG tablet Take 0.5 tablets (25 mg total) by mouth every 12 (twelve) hours as needed for moderate pain or severe pain. 30 tablet 1  . traZODone (DESYREL) 50 MG tablet Take 0.5 tablets (25 mg total) by mouth at bedtime. 15 tablet 3  . vitamin B-12 (CYANOCOBALAMIN) 100 MCG tablet Take 100 mcg by mouth daily.     No facility-administered medications prior to visit.     ROS: Review of Systems  Constitutional: Positive for fatigue. Negative for activity change, appetite change, chills, diaphoresis, fever and unexpected weight change.  HENT: Negative for congestion, dental problem, ear pain, hearing loss, mouth sores, postnasal drip, sinus pressure, sneezing, sore throat and voice change.   Eyes: Negative for pain and visual disturbance.  Respiratory: Negative for cough, chest tightness, wheezing and stridor.   Cardiovascular: Negative for chest pain, palpitations and leg swelling.  Gastrointestinal: Negative for abdominal distention, abdominal pain, blood in stool, nausea, rectal pain and vomiting.  Genitourinary: Negative for decreased urine volume, difficulty urinating, dysuria, frequency, hematuria, menstrual problem, vaginal bleeding, vaginal discharge and vaginal pain.  Musculoskeletal: Positive for arthralgias, back pain and gait problem. Negative for joint swelling and neck pain.  Skin: Negative for color change, rash and wound.  Neurological:  Positive for weakness. Negative for dizziness, tremors, syncope, speech difficulty and light-headedness.  Hematological: Negative for adenopathy.  Psychiatric/Behavioral: Negative for behavioral problems, confusion, decreased concentration, dysphoric mood, hallucinations, sleep disturbance and suicidal ideas. The patient is not nervous/anxious and is not hyperactive.     Objective:  BP 126/68 (BP Location: Left Arm, Patient Position: Sitting, Cuff Size: Normal)   Pulse 87   Temp 98 F (36.7 C) (Oral)   Ht 5\' 5"  (1.651 m)    Wt 146 lb (66.2 kg)   LMP  (LMP Unknown)   SpO2 96%   BMI 24.30 kg/m   BP Readings from Last 3 Encounters:  07/04/18 126/68  05/15/18 127/68  03/25/18 138/70    Wt Readings from Last 3 Encounters:  07/04/18 146 lb (66.2 kg)  03/25/18 140 lb 6.4 oz (63.7 kg)  02/27/18 136 lb (61.7 kg)    Physical Exam Constitutional:      General: She is not in acute distress.    Appearance: She is well-developed.  HENT:     Head: Normocephalic.     Right Ear: External ear normal.     Left Ear: External ear normal.     Nose: Nose normal.  Eyes:     General:        Right eye: No discharge.        Left eye: No discharge.     Conjunctiva/sclera: Conjunctivae normal.     Pupils: Pupils are equal, round, and reactive to light.  Neck:     Musculoskeletal: Normal range of motion and neck supple.     Thyroid: No thyromegaly.     Vascular: No JVD.     Trachea: No tracheal deviation.  Cardiovascular:     Rate and Rhythm: Normal rate and regular rhythm.     Heart sounds: Normal heart sounds.  Pulmonary:     Effort: No respiratory distress.     Breath sounds: No stridor. No wheezing.  Abdominal:     General: Bowel sounds are normal. There is no distension.     Palpations: Abdomen is soft. There is no mass.     Tenderness: There is no abdominal tenderness. There is no guarding or rebound.  Musculoskeletal:        General: No tenderness.  Lymphadenopathy:     Cervical: No cervical adenopathy.  Skin:    Findings: No erythema or rash.  Neurological:     Mental Status: She is alert and oriented to person, place, and time.     Cranial Nerves: No cranial nerve deficit.     Motor: Weakness present. No abnormal muscle tone.     Coordination: Coordination abnormal.     Gait: Gait abnormal.     Deep Tendon Reflexes: Reflexes normal.  Psychiatric:        Behavior: Behavior normal.        Thought Content: Thought content normal.        Judgment: Judgment normal.    In a w/c   Lab  Results  Component Value Date   WBC 8.9 10/26/2017   HGB 12.0 10/26/2017   HCT 35.6 (L) 10/26/2017   PLT 265.0 10/26/2017   GLUCOSE 108 (H) 10/26/2017   CHOL 217 (H) 05/07/2017   TRIG 94 05/07/2017   HDL 65 05/07/2017   LDLDIRECT 125.4 05/10/2011   LDLCALC 133 (H) 05/07/2017   ALT 16 05/10/2017   AST 25 05/10/2017   NA 139 10/26/2017   K 3.8 10/26/2017   CL 101 10/26/2017  CREATININE 1.12 10/26/2017   BUN 16 10/26/2017   CO2 29 10/26/2017   TSH 2.38 10/26/2017   INR 1.01 05/06/2017   HGBA1C 5.3 05/07/2017    No results found.  Assessment & Plan:   There are no diagnoses linked to this encounter.   No orders of the defined types were placed in this encounter.    Follow-up: No follow-ups on file.  Walker Kehr, MD

## 2018-07-04 NOTE — Assessment & Plan Note (Signed)
D/c Clonazepam

## 2018-08-22 ENCOUNTER — Other Ambulatory Visit: Payer: Self-pay

## 2018-08-23 MED ORDER — TRAMADOL HCL 50 MG PO TABS: 25.0000 mg | ORAL_TABLET | Freq: Two times a day (BID) | ORAL | 2 refills | Status: DC | PRN

## 2018-10-09 ENCOUNTER — Ambulatory Visit: Payer: Medicare Other | Admitting: Internal Medicine

## 2019-01-30 DIAGNOSIS — Z23 Encounter for immunization: Secondary | ICD-10-CM | POA: Diagnosis not present

## 2019-03-01 IMAGING — MR MR HEAD W/O CM
8 of 10 series · 34 of 48 positions shown · non-contrast
Comparison: CT head without contrast from the same day.

CLINICAL DATA: Focal neuro deficit, greater than 6 hours, stroke
suspected.

Left-sided weakness and facial droop.
EXAM:
MRI HEAD WITHOUT CONTRAST
TECHNIQUE: Multiplanar, multiecho pulse sequences of the brain and surrounding
structures were obtained without intravenous contrast.

[Series 3: DWI · axial · 3.0mm · 0.94mm/px · z∈[-82,+64]mm · 8 of 100 slices shown (1 of 2)]
[im 1/100]
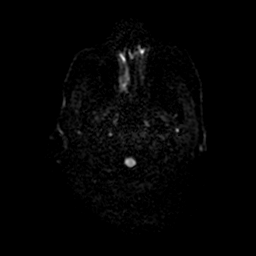
[im 15/100]
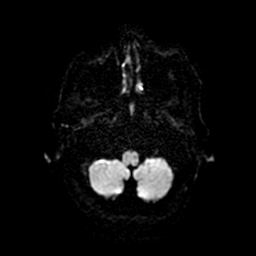
[im 29/100]
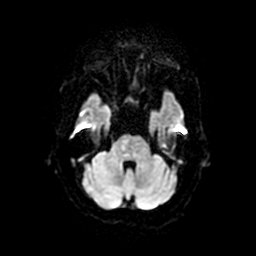
[im 43/100]
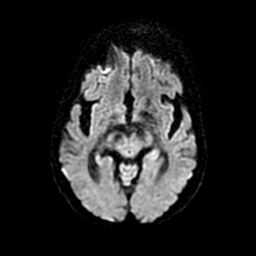
[im 57/100]
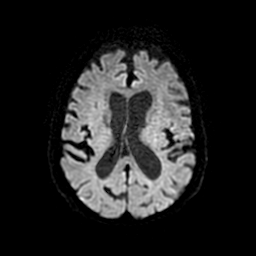
[im 71/100]
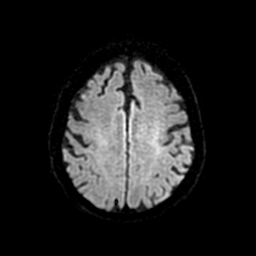
[im 85/100]
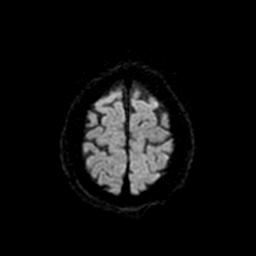
[im 100/100]
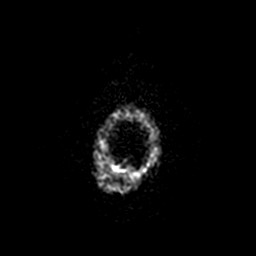

[Series 4: DWI · coronal · 4.0mm · 0.94mm/px · 7 of 72 slices shown (2 of 2)]
[im 1/72]
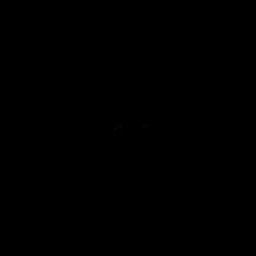
[im 12/72]
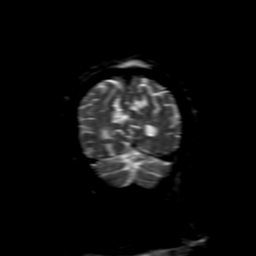
[im 24/72]
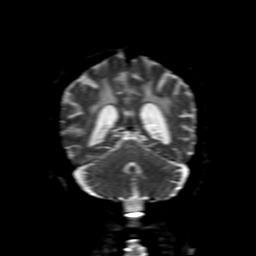
[im 36/72]
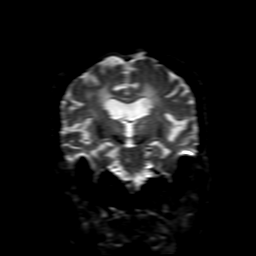
[im 48/72]
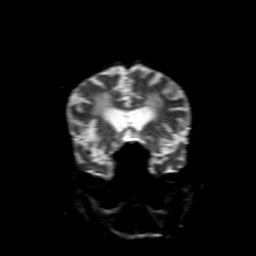
[im 60/72]
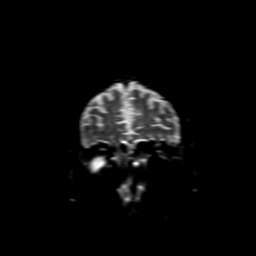
[im 72/72]
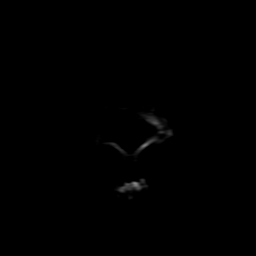

[Series 5: FLAIR · sagittal · 5.0mm · 0.47mm/px · 2 of 24 slices shown (1 of 2)]
[im 1/24]
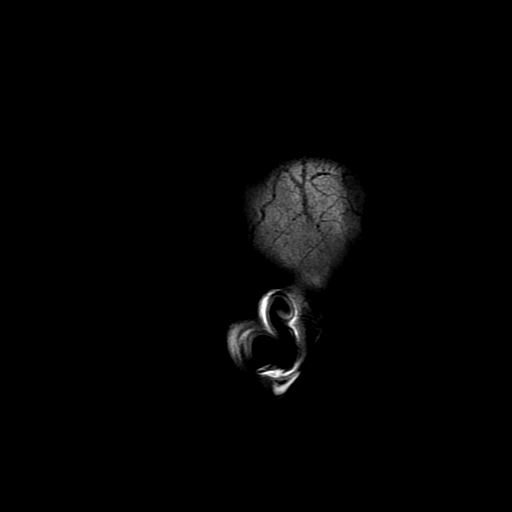
[im 24/24]
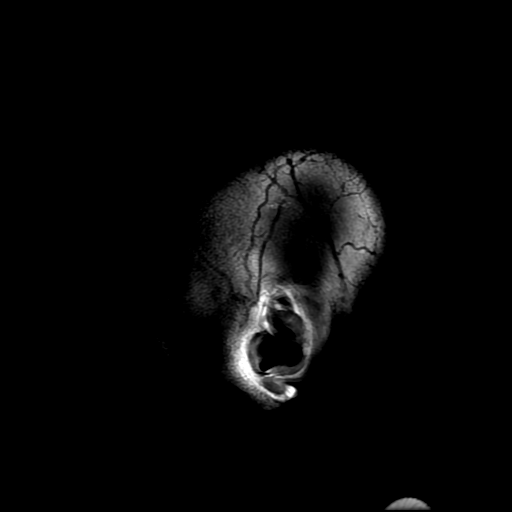

[Series 7: T2 · axial · 5.0mm · 0.47mm/px · z∈[-92,+74]mm · 3 of 29 slices shown (1 of 2)]
[im 1/29]
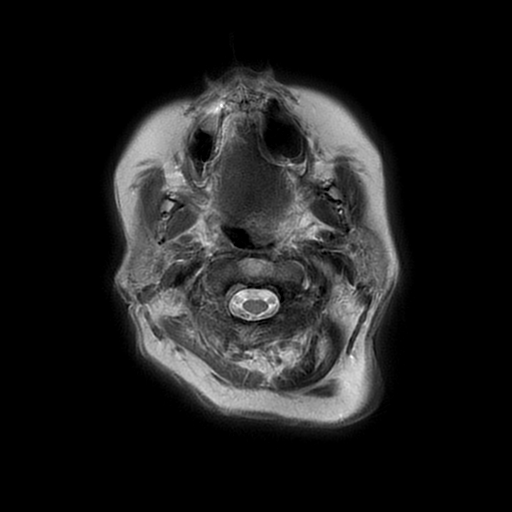
[im 15/29]
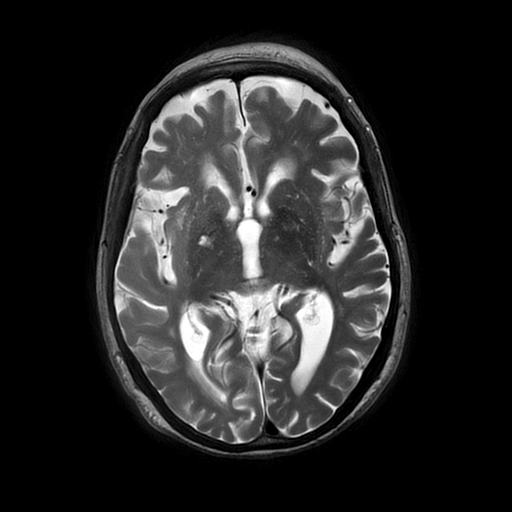
[im 29/29]
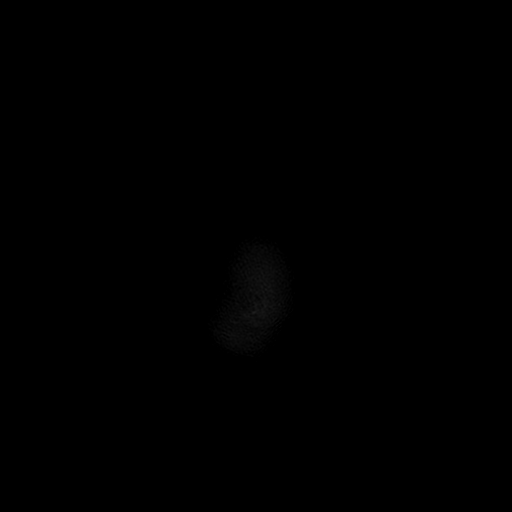

[Series 8: FLAIR · axial · 3.0mm · 0.41mm/px · z∈[-93,+73]mm · 3 of 29 slices shown (2 of 2)]
[im 1/29]
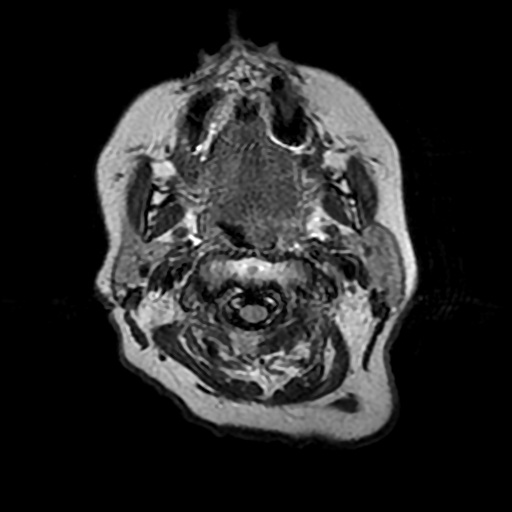
[im 15/29]
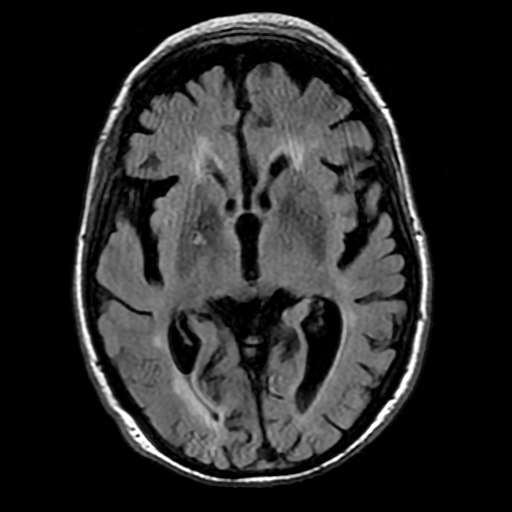
[im 29/29]
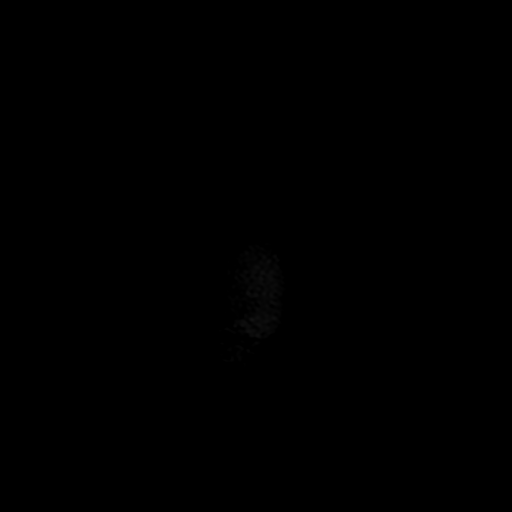

[Series 11: T2 · coronal · 5.0mm · 0.39mm/px · 3 of 30 slices shown (2 of 2)]
[im 1/30]
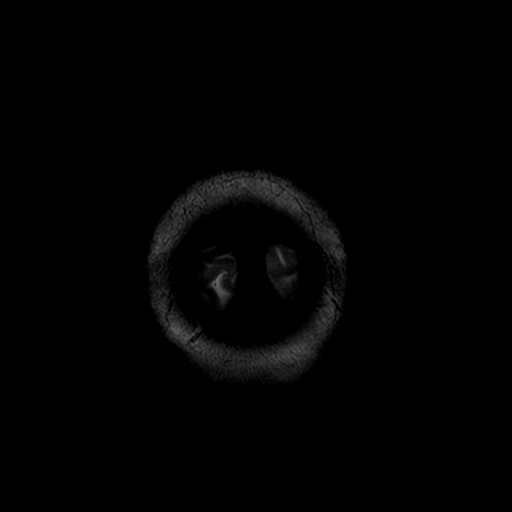
[im 15/30]
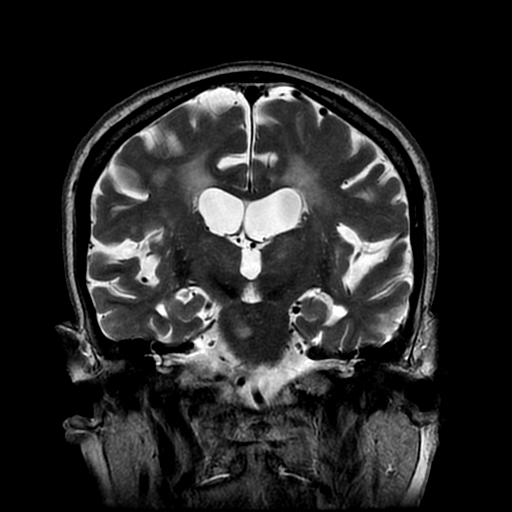
[im 30/30]
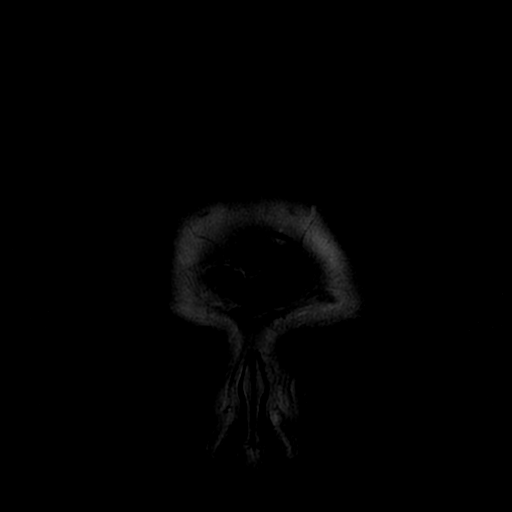

[Series 350: ADC · axial · 3.0mm · 0.94mm/px · z∈[-82,+64]mm · 5 of 50 slices shown (1 of 2)]
[im 1/50]
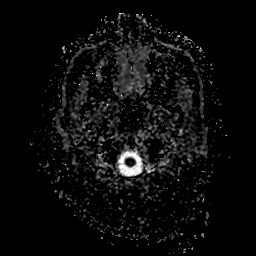
[im 13/50]
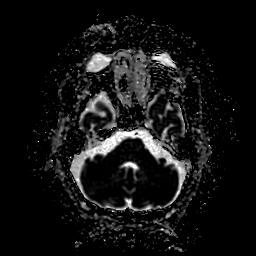
[im 25/50]
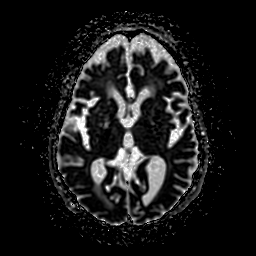
[im 37/50]
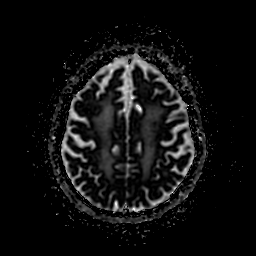
[im 50/50]
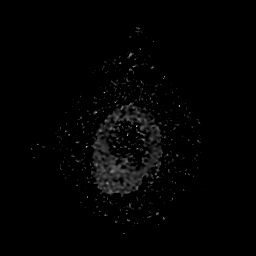

[Series 450: ADC · coronal · 4.0mm · 0.94mm/px · 3 of 36 slices shown (2 of 2)]
[im 1/36]
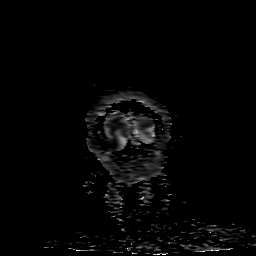
[im 18/36]
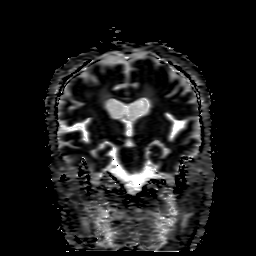
[im 36/36]
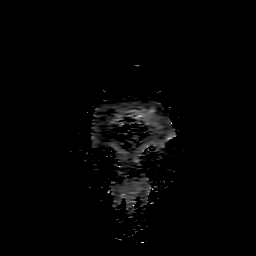

[34 of 48 positions shown; findings below may reference images not displayed]

FINDINGS: Brain: The diffusion-weighted images demonstrate a right paramedian
pontine infarct measuring 14 mm cephalo caudad. No acute
supratentorial infarct is present.

Advanced atrophy and confluent diffuse white matter disease is
present otherwise. There is a remote lacunar infarct involving the
right lentiform nucleus. Ischemic changes are present in the thalami
bilaterally. Mild white matter changes extend into the brainstem
otherwise. T2 signal changes associated with the acute/ subacute
infarct. The cerebellum is normal.

Vascular: Flow is present in the major intracranial arteries.

Skull and upper cervical spine: The skullbase is within normal
limits. Midline sagittal structures are unremarkable. The
craniocervical junction is within normal limits.

Sinuses/Orbits: Diffuse opacification is present in the ethmoid air
cells bilaterally. Right maxillary antrostomy is noted. There is
mild mucosal thickening involving the maxillary sinuses bilaterally.
Anterior sphenoid mucosal thickening is present. Mild mucosal
thickening is present in the frontal sinuses bilaterally. There is
some fluid in the mastoid air cells bilaterally. No obstructing
nasopharyngeal lesion is present.
IMPRESSION: 1. Acute/subacute nonhemorrhagic linear infarct involving the right
paramedian pons, corresponding with the patient's left-sided
weakness.
2. No acute supratentorial infarct.
3. Advanced atrophy and diffuse white matter disease likely reflects
the sequela of chronic microvascular ischemia.
4. Moderate paranasal sinus disease, predominantly involving the
ethmoids.

## 2019-03-01 IMAGING — MR MR MRA HEAD W/O CM
7 series · 16 of 16 positions shown · IV contrast (Yes MH)
Comparison: MRI head 05/06/2017

CLINICAL DATA: Stroke right pons

EXAM:
MRA NECK WITHOUT AND WITH CONTRAST
MRA HEAD WITHOUT CONTRAST
TECHNIQUE: Multiplanar and multiecho pulse sequences of the neck were obtained
without and with intravenous contrast. Angiographic images of the
neck were obtained using MRA technique without and with intravenous
contast.; Angiographic images of the Circle of Willis were obtained
using MRA technique without intravenous contrast.
CONTRAST:  10mL MULTIHANCE GADOBENATE DIMEGLUMINE 529 MG/ML IV SOLN

[Series 3: ax (id) 2 · axial · 1.0mm · 0.43mm/px · z∈[-81,+17]mm · 3 of 200 slices shown]
[im 1/200]
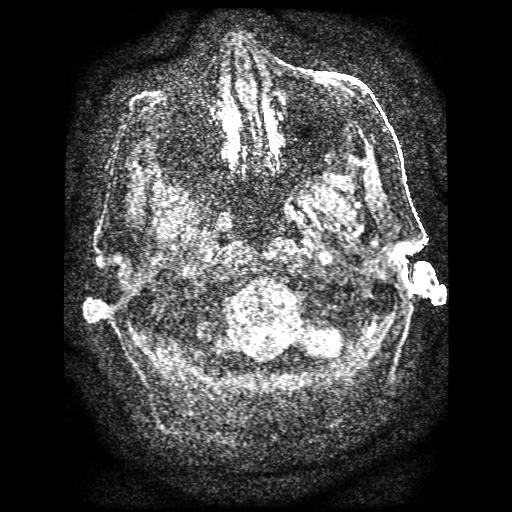
[im 100/200]
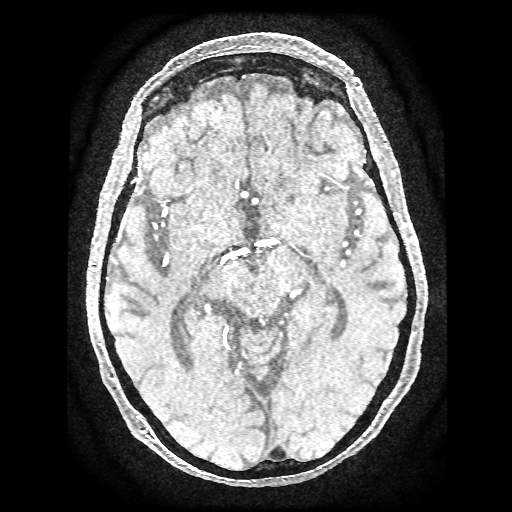
[im 200/200]
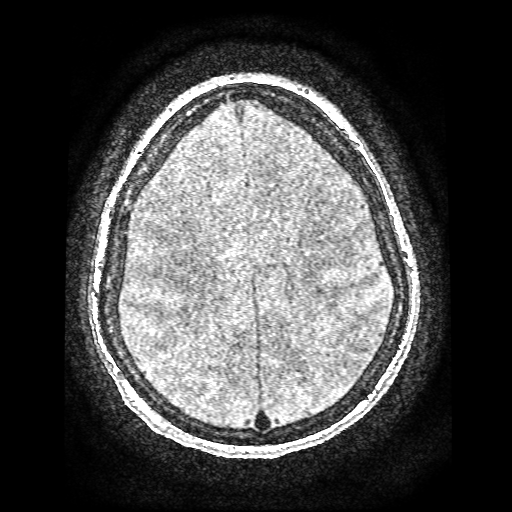

[Series 6: TOF · axial · 2.4mm · 0.47mm/px · z∈[-275,-70]mm · 3 of 184 slices shown]
[im 1/184]
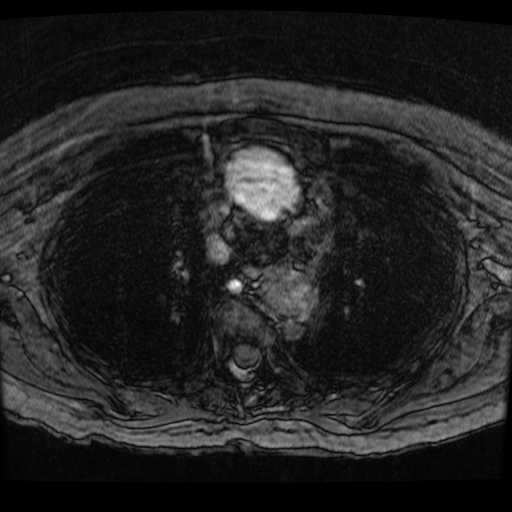
[im 92/184]
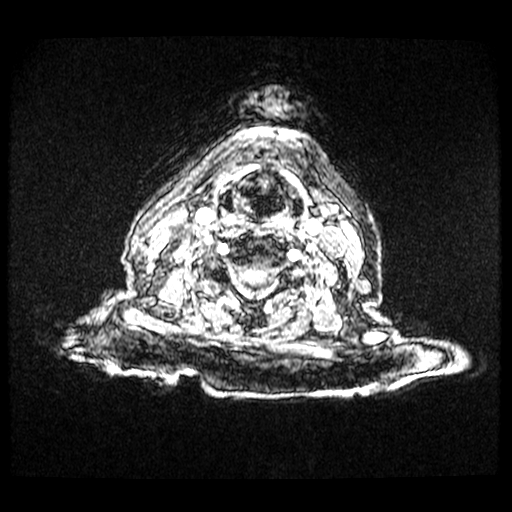
[im 184/184]
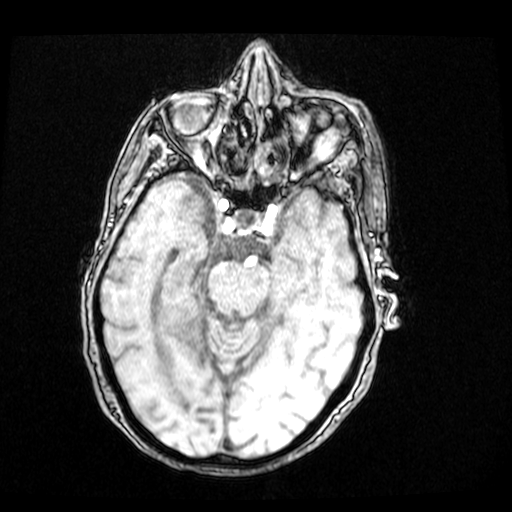

[Series 700: cor cemra ft · coronal · 1.2mm · 0.59mm/px · 2 of 113 slices shown]
[im 1/113]
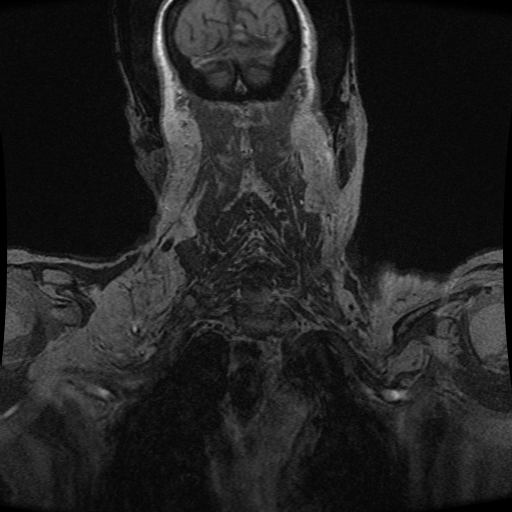
[im 113/113]
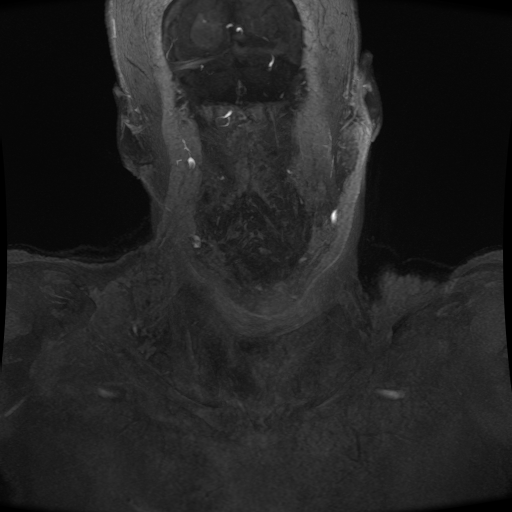

[Series 701: ph1/cor cemra ft · coronal · 1.2mm · 0.59mm/px · 2 of 112 slices shown]
[im 1/112]
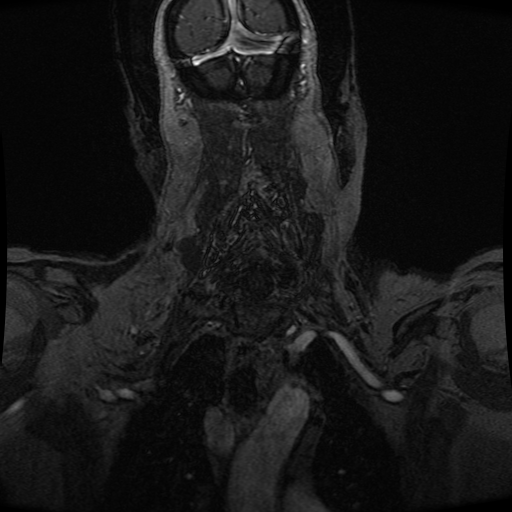
[im 112/112]
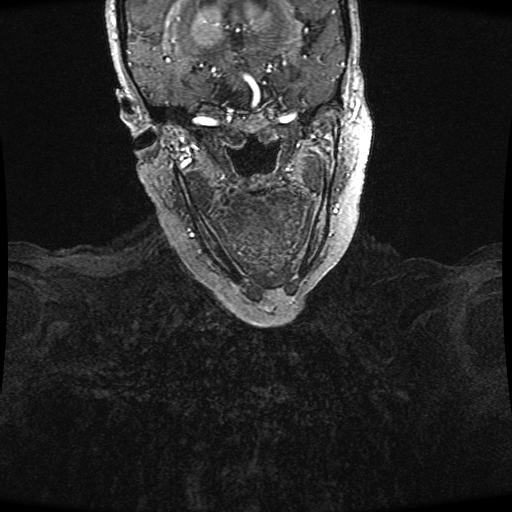

[Series 702: ph2/cor cemra ft · coronal · 1.2mm · 0.59mm/px · 2 of 112 slices shown]
[im 1/112]
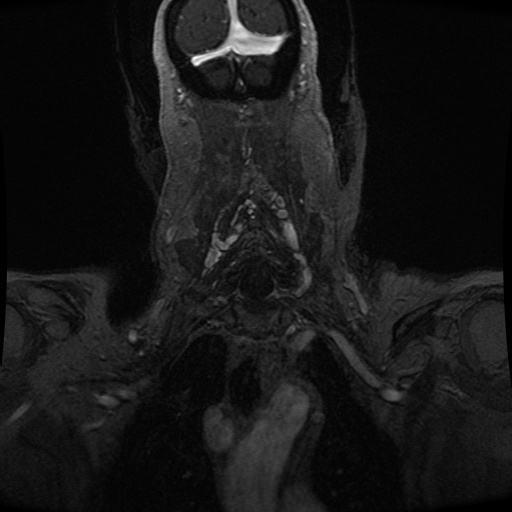
[im 112/112]
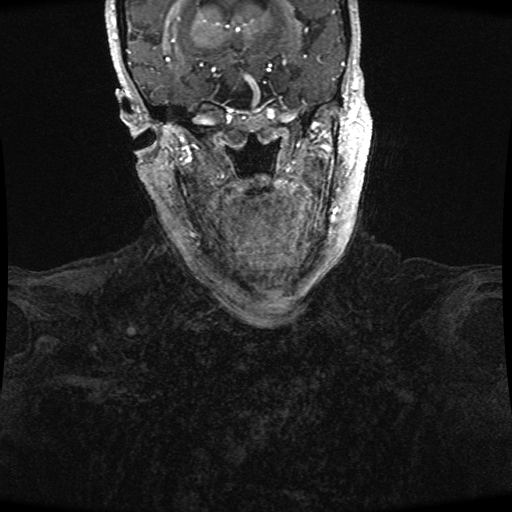

[((id)/701/1)-((id)/700/1) · coronal · 1.2mm · 0.59mm/px · 2 of 113 slices shown]
[im 1/113]
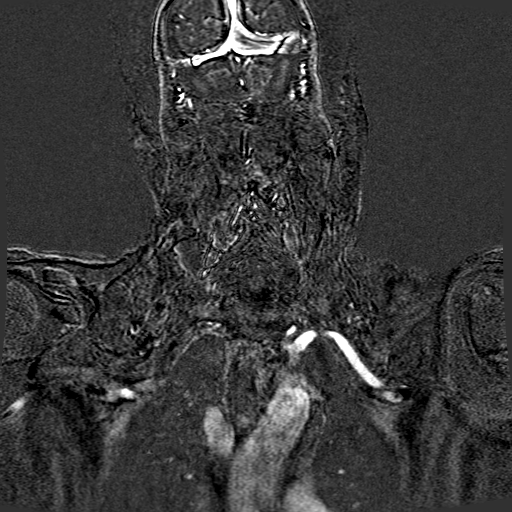
[im 113/113]
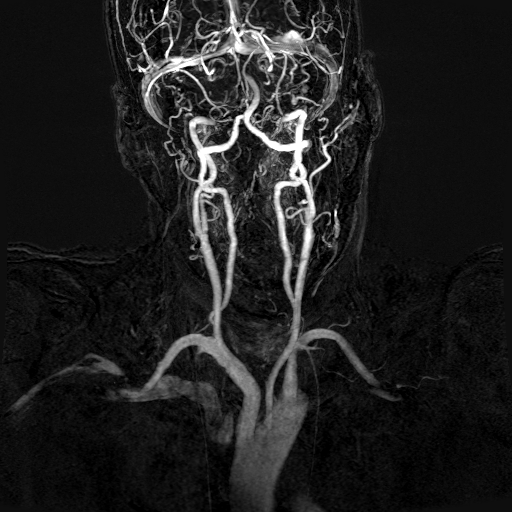

[((id)/702/1)-((id)/700/1) · coronal · 1.2mm · 0.59mm/px · 2 of 113 slices shown]
[im 1/113]
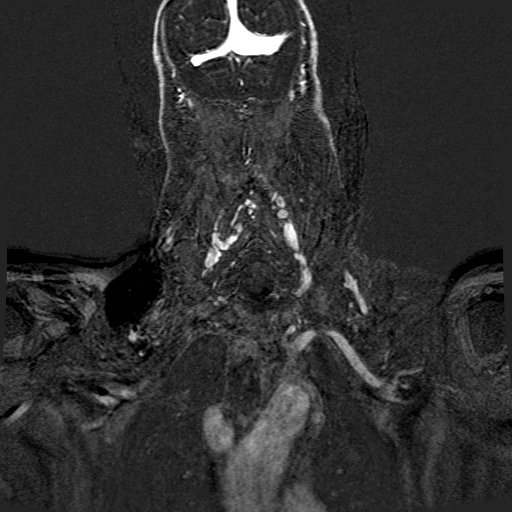
[im 113/113]
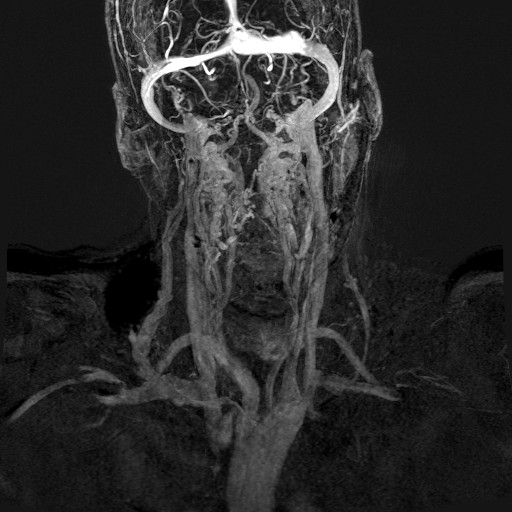

[16 of 16 positions shown; findings below may reference images not displayed]

FINDINGS: MRA NECK FINDINGS

Normal aortic arch. Antegrade flow in the carotid and vertebral
arteries bilaterally.

Atherosclerotic disease and mild stenosis at the origin of the
internal carotid artery bilaterally.

Moderate stenosis at the origin of the right vertebral artery.
Origin of the left vertebral arteries not included on the study and
not evaluated. Both vertebral arteries are patent to the basilar
without additional stenosis.

MRA HEAD FINDINGS

Both vertebral arteries widely patent to the basilar. PICA patent
bilaterally. Basilar widely patent. Moderate stenosis left superior
cerebellar artery. Right superior cerebellar arteries patent. Mild
stenosis right posterior cerebral artery. Left posterior cerebral
artery widely patent.

Internal carotid artery widely patent bilaterally without stenosis.
Anterior and middle cerebral arteries widely patent bilaterally
without stenosis.

Negative for cerebral aneurysm.
IMPRESSION: Mild stenosis at the origin of the carotid artery bilaterally

Moderate stenosis at the origin of the right vertebral artery.
Origin of left vertebral not included on the study.

Moderate stenosis left superior cerebellar artery. Mild stenosis
right posterior cerebral artery. Basilar artery widely patent.

## 2019-04-23 DIAGNOSIS — Z1159 Encounter for screening for other viral diseases: Secondary | ICD-10-CM | POA: Diagnosis not present

## 2019-04-23 DIAGNOSIS — Z20828 Contact with and (suspected) exposure to other viral communicable diseases: Secondary | ICD-10-CM | POA: Diagnosis not present

## 2019-04-28 DIAGNOSIS — Z1159 Encounter for screening for other viral diseases: Secondary | ICD-10-CM | POA: Diagnosis not present

## 2019-04-28 DIAGNOSIS — Z20828 Contact with and (suspected) exposure to other viral communicable diseases: Secondary | ICD-10-CM | POA: Diagnosis not present

## 2019-05-05 DIAGNOSIS — Z20828 Contact with and (suspected) exposure to other viral communicable diseases: Secondary | ICD-10-CM | POA: Diagnosis not present

## 2019-05-05 DIAGNOSIS — Z1159 Encounter for screening for other viral diseases: Secondary | ICD-10-CM | POA: Diagnosis not present

## 2019-05-12 DIAGNOSIS — Z1159 Encounter for screening for other viral diseases: Secondary | ICD-10-CM | POA: Diagnosis not present

## 2019-05-12 DIAGNOSIS — Z20828 Contact with and (suspected) exposure to other viral communicable diseases: Secondary | ICD-10-CM | POA: Diagnosis not present

## 2019-05-15 DIAGNOSIS — Z20828 Contact with and (suspected) exposure to other viral communicable diseases: Secondary | ICD-10-CM | POA: Diagnosis not present

## 2019-05-15 DIAGNOSIS — Z1159 Encounter for screening for other viral diseases: Secondary | ICD-10-CM | POA: Diagnosis not present

## 2019-05-19 DIAGNOSIS — Z20828 Contact with and (suspected) exposure to other viral communicable diseases: Secondary | ICD-10-CM | POA: Diagnosis not present

## 2019-05-19 DIAGNOSIS — Z1159 Encounter for screening for other viral diseases: Secondary | ICD-10-CM | POA: Diagnosis not present

## 2019-05-22 DIAGNOSIS — Z20828 Contact with and (suspected) exposure to other viral communicable diseases: Secondary | ICD-10-CM | POA: Diagnosis not present

## 2019-05-22 DIAGNOSIS — Z1159 Encounter for screening for other viral diseases: Secondary | ICD-10-CM | POA: Diagnosis not present

## 2019-05-26 DIAGNOSIS — Z20828 Contact with and (suspected) exposure to other viral communicable diseases: Secondary | ICD-10-CM | POA: Diagnosis not present

## 2019-05-26 DIAGNOSIS — Z1159 Encounter for screening for other viral diseases: Secondary | ICD-10-CM | POA: Diagnosis not present

## 2019-05-29 DIAGNOSIS — Z20828 Contact with and (suspected) exposure to other viral communicable diseases: Secondary | ICD-10-CM | POA: Diagnosis not present

## 2019-05-29 DIAGNOSIS — Z1159 Encounter for screening for other viral diseases: Secondary | ICD-10-CM | POA: Diagnosis not present

## 2019-05-29 DIAGNOSIS — Z23 Encounter for immunization: Secondary | ICD-10-CM | POA: Diagnosis not present

## 2019-06-02 DIAGNOSIS — Z1159 Encounter for screening for other viral diseases: Secondary | ICD-10-CM | POA: Diagnosis not present

## 2019-06-02 DIAGNOSIS — Z20828 Contact with and (suspected) exposure to other viral communicable diseases: Secondary | ICD-10-CM | POA: Diagnosis not present

## 2019-06-05 DIAGNOSIS — Z20828 Contact with and (suspected) exposure to other viral communicable diseases: Secondary | ICD-10-CM | POA: Diagnosis not present

## 2019-06-05 DIAGNOSIS — Z1159 Encounter for screening for other viral diseases: Secondary | ICD-10-CM | POA: Diagnosis not present

## 2019-06-09 DIAGNOSIS — Z1159 Encounter for screening for other viral diseases: Secondary | ICD-10-CM | POA: Diagnosis not present

## 2019-06-09 DIAGNOSIS — Z20828 Contact with and (suspected) exposure to other viral communicable diseases: Secondary | ICD-10-CM | POA: Diagnosis not present

## 2019-06-12 DIAGNOSIS — Z1159 Encounter for screening for other viral diseases: Secondary | ICD-10-CM | POA: Diagnosis not present

## 2019-06-12 DIAGNOSIS — Z20828 Contact with and (suspected) exposure to other viral communicable diseases: Secondary | ICD-10-CM | POA: Diagnosis not present

## 2019-06-16 DIAGNOSIS — Z20828 Contact with and (suspected) exposure to other viral communicable diseases: Secondary | ICD-10-CM | POA: Diagnosis not present

## 2019-06-16 DIAGNOSIS — Z1159 Encounter for screening for other viral diseases: Secondary | ICD-10-CM | POA: Diagnosis not present

## 2019-06-19 DIAGNOSIS — Z20828 Contact with and (suspected) exposure to other viral communicable diseases: Secondary | ICD-10-CM | POA: Diagnosis not present

## 2019-06-19 DIAGNOSIS — Z1159 Encounter for screening for other viral diseases: Secondary | ICD-10-CM | POA: Diagnosis not present

## 2019-06-23 DIAGNOSIS — Z1159 Encounter for screening for other viral diseases: Secondary | ICD-10-CM | POA: Diagnosis not present

## 2019-06-23 DIAGNOSIS — Z20828 Contact with and (suspected) exposure to other viral communicable diseases: Secondary | ICD-10-CM | POA: Diagnosis not present

## 2019-06-26 DIAGNOSIS — Z23 Encounter for immunization: Secondary | ICD-10-CM | POA: Diagnosis not present

## 2019-06-26 DIAGNOSIS — Z1159 Encounter for screening for other viral diseases: Secondary | ICD-10-CM | POA: Diagnosis not present

## 2019-06-26 DIAGNOSIS — Z20828 Contact with and (suspected) exposure to other viral communicable diseases: Secondary | ICD-10-CM | POA: Diagnosis not present

## 2019-06-30 DIAGNOSIS — Z1159 Encounter for screening for other viral diseases: Secondary | ICD-10-CM | POA: Diagnosis not present

## 2019-06-30 DIAGNOSIS — Z20828 Contact with and (suspected) exposure to other viral communicable diseases: Secondary | ICD-10-CM | POA: Diagnosis not present

## 2019-07-02 ENCOUNTER — Telehealth: Payer: Self-pay | Admitting: Internal Medicine

## 2019-07-02 NOTE — Telephone Encounter (Signed)
   [facility] calling to request verbal to fill/order Advised pharmacy patient has not been seen this year.  Pharmacy requesting call [phone]

## 2019-07-03 DIAGNOSIS — Z20828 Contact with and (suspected) exposure to other viral communicable diseases: Secondary | ICD-10-CM | POA: Diagnosis not present

## 2019-07-03 DIAGNOSIS — Z1159 Encounter for screening for other viral diseases: Secondary | ICD-10-CM | POA: Diagnosis not present

## 2019-07-03 NOTE — Telephone Encounter (Signed)
Pharmacy notified that pt has not been seen in a year and will need an OV to get refills. They stated they were going to reach out to nursing home to notify them to schedule OV

## 2019-07-07 DIAGNOSIS — Z1159 Encounter for screening for other viral diseases: Secondary | ICD-10-CM | POA: Diagnosis not present

## 2019-07-07 DIAGNOSIS — Z20828 Contact with and (suspected) exposure to other viral communicable diseases: Secondary | ICD-10-CM | POA: Diagnosis not present

## 2019-07-10 DIAGNOSIS — Z20828 Contact with and (suspected) exposure to other viral communicable diseases: Secondary | ICD-10-CM | POA: Diagnosis not present

## 2019-07-10 DIAGNOSIS — Z1159 Encounter for screening for other viral diseases: Secondary | ICD-10-CM | POA: Diagnosis not present

## 2019-07-14 DIAGNOSIS — Z20828 Contact with and (suspected) exposure to other viral communicable diseases: Secondary | ICD-10-CM | POA: Diagnosis not present

## 2019-07-14 DIAGNOSIS — Z1159 Encounter for screening for other viral diseases: Secondary | ICD-10-CM | POA: Diagnosis not present

## 2019-07-17 DIAGNOSIS — Z1159 Encounter for screening for other viral diseases: Secondary | ICD-10-CM | POA: Diagnosis not present

## 2019-07-17 DIAGNOSIS — Z20828 Contact with and (suspected) exposure to other viral communicable diseases: Secondary | ICD-10-CM | POA: Diagnosis not present

## 2019-07-21 ENCOUNTER — Encounter: Payer: Self-pay | Admitting: Internal Medicine

## 2019-07-21 ENCOUNTER — Ambulatory Visit (INDEPENDENT_AMBULATORY_CARE_PROVIDER_SITE_OTHER): Payer: Medicare Other | Admitting: Internal Medicine

## 2019-07-21 ENCOUNTER — Other Ambulatory Visit: Payer: Self-pay

## 2019-07-21 VITALS — BP 140/88 | HR 92 | Temp 97.8°F | Ht 65.0 in | Wt 155.0 lb

## 2019-07-21 DIAGNOSIS — Z1159 Encounter for screening for other viral diseases: Secondary | ICD-10-CM | POA: Diagnosis not present

## 2019-07-21 DIAGNOSIS — E538 Deficiency of other specified B group vitamins: Secondary | ICD-10-CM

## 2019-07-21 DIAGNOSIS — R29898 Other symptoms and signs involving the musculoskeletal system: Secondary | ICD-10-CM | POA: Diagnosis not present

## 2019-07-21 DIAGNOSIS — G8929 Other chronic pain: Secondary | ICD-10-CM | POA: Diagnosis not present

## 2019-07-21 DIAGNOSIS — E785 Hyperlipidemia, unspecified: Secondary | ICD-10-CM | POA: Diagnosis not present

## 2019-07-21 DIAGNOSIS — I10 Essential (primary) hypertension: Secondary | ICD-10-CM | POA: Diagnosis not present

## 2019-07-21 DIAGNOSIS — I6302 Cerebral infarction due to thrombosis of basilar artery: Secondary | ICD-10-CM | POA: Diagnosis not present

## 2019-07-21 DIAGNOSIS — Z20828 Contact with and (suspected) exposure to other viral communicable diseases: Secondary | ICD-10-CM | POA: Diagnosis not present

## 2019-07-21 DIAGNOSIS — E559 Vitamin D deficiency, unspecified: Secondary | ICD-10-CM

## 2019-07-21 DIAGNOSIS — M545 Low back pain, unspecified: Secondary | ICD-10-CM | POA: Insufficient documentation

## 2019-07-21 LAB — HEPATIC FUNCTION PANEL
ALT: 15 U/L (ref 0–35)
AST: 19 U/L (ref 0–37)
Albumin: 3.7 g/dL (ref 3.5–5.2)
Alkaline Phosphatase: 109 U/L (ref 39–117)
Bilirubin, Direct: 0.1 mg/dL (ref 0.0–0.3)
Total Bilirubin: 0.4 mg/dL (ref 0.2–1.2)
Total Protein: 7.3 g/dL (ref 6.0–8.3)

## 2019-07-21 LAB — BASIC METABOLIC PANEL
BUN: 12 mg/dL (ref 6–23)
CO2: 28 mEq/L (ref 19–32)
Calcium: 9.3 mg/dL (ref 8.4–10.5)
Chloride: 103 mEq/L (ref 96–112)
Creatinine, Ser: 0.95 mg/dL (ref 0.40–1.20)
GFR: 54.4 mL/min — ABNORMAL LOW (ref >60.00)
Glucose, Bld: 104 mg/dL — ABNORMAL HIGH (ref 70–99)
Potassium: 4 mEq/L (ref 3.5–5.1)
Sodium: 140 mEq/L (ref 135–145)

## 2019-07-21 LAB — LIPID PANEL
Cholesterol: 168 mg/dL (ref 0–200)
HDL: 60.7 mg/dL (ref >39.00)
LDL Cholesterol: 90 mg/dL (ref 0–99)
NonHDL: 107.65
Total CHOL/HDL Ratio: 3
Triglycerides: 90 mg/dL (ref 0.0–149.0)
VLDL: 18 mg/dL (ref 0.0–40.0)

## 2019-07-21 LAB — CBC WITH DIFFERENTIAL/PLATELET
Basophils Absolute: 0.1 10*3/uL (ref 0.0–0.1)
Basophils Relative: 1 % (ref 0.0–3.0)
Eosinophils Absolute: 0.3 10*3/uL (ref 0.0–0.7)
Eosinophils Relative: 4.1 % (ref 0.0–5.0)
HCT: 35.5 % — ABNORMAL LOW (ref 36.0–46.0)
Hemoglobin: 11.7 g/dL — ABNORMAL LOW (ref 12.0–15.0)
Lymphocytes Relative: 19 % (ref 12.0–46.0)
Lymphs Abs: 1.3 10*3/uL (ref 0.7–4.0)
MCHC: 33 g/dL (ref 30.0–36.0)
MCV: 86.3 fl (ref 78.0–100.0)
Monocytes Absolute: 0.6 10*3/uL (ref 0.1–1.0)
Monocytes Relative: 8.8 % (ref 3.0–12.0)
Neutro Abs: 4.6 10*3/uL (ref 1.4–7.7)
Neutrophils Relative %: 67.1 % (ref 43.0–77.0)
Platelets: 260 10*3/uL (ref 150.0–400.0)
RBC: 4.11 Mil/uL (ref 3.87–5.11)
RDW: 13.1 % (ref 11.5–15.5)
WBC: 6.9 10*3/uL (ref 4.0–10.5)

## 2019-07-21 LAB — TSH: TSH: 2.59 u[IU]/mL (ref 0.35–4.50)

## 2019-07-21 LAB — VITAMIN B12: Vitamin B-12: 1075 pg/mL — ABNORMAL HIGH (ref 211–911)

## 2019-07-21 MED ORDER — TRAMADOL HCL 50 MG PO TABS: 50.0000 mg | ORAL_TABLET | Freq: Two times a day (BID) | ORAL | 1 refills | Status: DC | PRN

## 2019-07-21 NOTE — Addendum Note (Signed)
Addended by: Cresenciano Lick on: 07/21/2019 02:11 PM   Modules accepted: Orders

## 2019-07-21 NOTE — Assessment & Plan Note (Signed)
On B12 

## 2019-07-21 NOTE — Assessment & Plan Note (Signed)
Tramadol prn ° Potential benefits of a long term opioids use as well as potential risks (i.e. addiction risk, apnea etc) and complications (i.e. Somnolence, constipation and others) were explained to the patient and were aknowledged. ° ° °

## 2019-07-21 NOTE — Assessment & Plan Note (Signed)
Plavix Labs

## 2019-07-21 NOTE — Assessment & Plan Note (Signed)
Lopressor and Norvasc

## 2019-07-21 NOTE — Assessment & Plan Note (Signed)
Vit D 

## 2019-07-21 NOTE — Progress Notes (Signed)
Subjective:  Patient ID: Theresa Hampton, female    DOB: 1919-05-10  Age: 86 y.o. MRN: [id number]  CC: No chief complaint on file.   HPI TRINISHA ROJEK presents for LBP, dyslipidemia,  Outpatient Medications Prior to Visit  Medication Sig Dispense Refill  . acetaminophen (TYLENOL) 325 MG tablet Take 2 tablets (650 mg total) by mouth every 6 (six) hours as needed.    Marland Kitchen atorvastatin (LIPITOR) 40 MG tablet Take 1 tablet (40 mg total) by mouth daily at 6 PM. 30 tablet 0  . cholecalciferol (VITAMIN D) 400 units TABS tablet Take 400 Units by mouth.    . clopidogrel (PLAVIX) 75 MG tablet Take 1 tablet (75 mg total) by mouth daily. 30 tablet 0  . diclofenac sodium (VOLTAREN) 1 % GEL Apply 2 g topically 4 (four) times daily. 4 Tube 0  . fluticasone (FLONASE) 50 MCG/ACT nasal spray Place into both nostrils daily.    . furosemide (LASIX) 20 MG tablet Take 1 tablet (20 mg total) by mouth daily. 30 tablet 5  . gabapentin (NEURONTIN) 100 MG capsule TAKE 1 CAPSULE BY MOUTH AT BEDTIME 30 capsule 1  . loratadine (CLARITIN) 10 MG tablet Take 1 tablet (10 mg total) by mouth daily. 100 tablet 2  . LUMIGAN 0.01 % SOLN instill 1 drop IN [facility] EYE AT BEDTIME 7.5 mL 0  . Menthol-Methyl Salicylate (MUSCLE RUB) 10-15 % CREA Apply 1 application topically 2 (two) times daily as needed for muscle pain (BACK PAIN).  0  . mirtazapine (REMERON) 15 MG tablet TAKE ONE TABLET BY MOUTH EVERY EVENING BEFORE DINNER AT 4 - 5 PM 30 tablet 1  . Multiple Vitamins-Iron (MULTIVITAMINS WITH IRON) TABS tablet Take 1 tablet by mouth daily.  0  . potassium chloride (KLOR-CON 10) 10 MEQ tablet Take 1 tablet (10 mEq total) by mouth daily. 30 tablet 5  . senna-docusate (SENOKOT-S) 8.6-50 MG tablet Take 1 tablet by mouth at bedtime as needed for moderate constipation. 30 tablet 0  . tolterodine (DETROL LA) 4 MG 24 hr capsule Take 1 capsule (4 mg total) by mouth daily. 30 capsule 11  . traMADol (ULTRAM) 50 MG tablet Take 0.5 tablets (25  mg total) by mouth every 12 (twelve) hours as needed for moderate pain or severe pain. 30 tablet 2  . traZODone (DESYREL) 50 MG tablet Take 0.5 tablets (25 mg total) by mouth at bedtime. 15 tablet 3  . vitamin B-12 (CYANOCOBALAMIN) 100 MCG tablet Take 100 mcg by mouth daily.     No facility-administered medications prior to visit.    ROS: Review of Systems  Constitutional: Positive for fatigue. Negative for activity change, appetite change, chills and unexpected weight change.  HENT: Negative for congestion, mouth sores and sinus pressure.   Eyes: Negative for visual disturbance.  Respiratory: Negative for cough and chest tightness.   Gastrointestinal: Negative for abdominal pain and nausea.  Genitourinary: Negative for difficulty urinating, frequency and vaginal pain.  Musculoskeletal: Positive for arthralgias, back pain and gait problem.  Skin: Negative for pallor and rash.  Neurological: Negative for dizziness, tremors, weakness, numbness and headaches.  Psychiatric/Behavioral: Negative for confusion and sleep disturbance.    Objective:  BP 140/88 (BP Location: Left Arm, Patient Position: Sitting, Cuff Size: Normal)   Pulse 92   Temp 97.8 F (36.6 C) (Oral)   Ht 5\' 5"  (1.651 m)   Wt 155 lb (70.3 kg)   LMP  (LMP Unknown)   SpO2 97%   BMI 25.79  kg/m   BP Readings from Last 3 Encounters:  07/21/19 140/88  07/04/18 126/68  05/15/18 127/68    Wt Readings from Last 3 Encounters:  07/21/19 155 lb (70.3 kg)  07/04/18 146 lb (66.2 kg)  03/25/18 140 lb 6.4 oz (63.7 kg)    Physical Exam Constitutional:      General: She is not in acute distress.    Appearance: She is well-developed.  HENT:     Head: Normocephalic.     Right Ear: External ear normal.     Left Ear: External ear normal.     Nose: Nose normal.  Eyes:     General:        Right eye: No discharge.        Left eye: No discharge.     Conjunctiva/sclera: Conjunctivae normal.     Pupils: Pupils are equal,  round, and reactive to light.  Neck:     Thyroid: No thyromegaly.     Vascular: No JVD.     Trachea: No tracheal deviation.  Cardiovascular:     Rate and Rhythm: Normal rate and regular rhythm.     Heart sounds: Normal heart sounds.  Pulmonary:     Effort: No respiratory distress.     Breath sounds: No stridor. No wheezing.  Abdominal:     General: Bowel sounds are normal. There is no distension.     Palpations: Abdomen is soft. There is no mass.     Tenderness: There is no abdominal tenderness. There is no guarding or rebound.  Musculoskeletal:        General: No tenderness.     Cervical back: Normal range of motion and neck supple.  Lymphadenopathy:     Cervical: No cervical adenopathy.  Skin:    Findings: No erythema or rash.  Neurological:     Cranial Nerves: No cranial nerve deficit.     Motor: No abnormal muscle tone.     Coordination: Coordination abnormal.     Deep Tendon Reflexes: Reflexes normal.  Psychiatric:        Behavior: Behavior normal.        Thought Content: Thought content normal.        Judgment: Judgment normal.   w/c  Lab Results  Component Value Date   WBC 7.5 07/04/2018   HGB 12.0 07/04/2018   HCT 34.7 (L) 07/04/2018   PLT 242.0 07/04/2018   GLUCOSE 96 07/04/2018   CHOL 217 (H) 05/07/2017   TRIG 94 05/07/2017   HDL 65 05/07/2017   LDLDIRECT 125.4 05/10/2011   LDLCALC 133 (H) 05/07/2017   ALT 14 07/04/2018   AST 18 07/04/2018   NA 139 07/04/2018   K 4.3 07/04/2018   CL 102 07/04/2018   CREATININE 1.10 07/04/2018   BUN 15 07/04/2018   CO2 29 07/04/2018   TSH 2.99 07/04/2018   INR 1.01 05/06/2017   HGBA1C 5.3 05/07/2017    No results found.  Assessment & Plan:    Walker Kehr, MD

## 2019-07-21 NOTE — Assessment & Plan Note (Signed)
On HTN

## 2019-07-24 DIAGNOSIS — Z20828 Contact with and (suspected) exposure to other viral communicable diseases: Secondary | ICD-10-CM | POA: Diagnosis not present

## 2019-07-24 DIAGNOSIS — Z1159 Encounter for screening for other viral diseases: Secondary | ICD-10-CM | POA: Diagnosis not present

## 2019-07-28 DIAGNOSIS — Z1159 Encounter for screening for other viral diseases: Secondary | ICD-10-CM | POA: Diagnosis not present

## 2019-07-28 DIAGNOSIS — Z20828 Contact with and (suspected) exposure to other viral communicable diseases: Secondary | ICD-10-CM | POA: Diagnosis not present

## 2019-07-31 DIAGNOSIS — Z1159 Encounter for screening for other viral diseases: Secondary | ICD-10-CM | POA: Diagnosis not present

## 2019-07-31 DIAGNOSIS — Z20828 Contact with and (suspected) exposure to other viral communicable diseases: Secondary | ICD-10-CM | POA: Diagnosis not present

## 2019-08-04 DIAGNOSIS — Z1159 Encounter for screening for other viral diseases: Secondary | ICD-10-CM | POA: Diagnosis not present

## 2019-08-04 DIAGNOSIS — Z20828 Contact with and (suspected) exposure to other viral communicable diseases: Secondary | ICD-10-CM | POA: Diagnosis not present

## 2019-08-07 DIAGNOSIS — Z1159 Encounter for screening for other viral diseases: Secondary | ICD-10-CM | POA: Diagnosis not present

## 2019-08-07 DIAGNOSIS — Z20828 Contact with and (suspected) exposure to other viral communicable diseases: Secondary | ICD-10-CM | POA: Diagnosis not present

## 2019-08-11 DIAGNOSIS — Z20828 Contact with and (suspected) exposure to other viral communicable diseases: Secondary | ICD-10-CM | POA: Diagnosis not present

## 2019-08-11 DIAGNOSIS — Z1159 Encounter for screening for other viral diseases: Secondary | ICD-10-CM | POA: Diagnosis not present

## 2019-08-14 DIAGNOSIS — Z1159 Encounter for screening for other viral diseases: Secondary | ICD-10-CM | POA: Diagnosis not present

## 2019-08-14 DIAGNOSIS — Z20828 Contact with and (suspected) exposure to other viral communicable diseases: Secondary | ICD-10-CM | POA: Diagnosis not present

## 2019-08-18 DIAGNOSIS — Z1159 Encounter for screening for other viral diseases: Secondary | ICD-10-CM | POA: Diagnosis not present

## 2019-08-18 DIAGNOSIS — Z20828 Contact with and (suspected) exposure to other viral communicable diseases: Secondary | ICD-10-CM | POA: Diagnosis not present

## 2019-08-21 DIAGNOSIS — Z1159 Encounter for screening for other viral diseases: Secondary | ICD-10-CM | POA: Diagnosis not present

## 2019-08-21 DIAGNOSIS — Z20828 Contact with and (suspected) exposure to other viral communicable diseases: Secondary | ICD-10-CM | POA: Diagnosis not present

## 2019-08-25 DIAGNOSIS — Z1159 Encounter for screening for other viral diseases: Secondary | ICD-10-CM | POA: Diagnosis not present

## 2019-08-25 DIAGNOSIS — Z20828 Contact with and (suspected) exposure to other viral communicable diseases: Secondary | ICD-10-CM | POA: Diagnosis not present

## 2019-08-28 DIAGNOSIS — Z1159 Encounter for screening for other viral diseases: Secondary | ICD-10-CM | POA: Diagnosis not present

## 2019-08-28 DIAGNOSIS — Z20828 Contact with and (suspected) exposure to other viral communicable diseases: Secondary | ICD-10-CM | POA: Diagnosis not present

## 2019-09-01 DIAGNOSIS — Z1159 Encounter for screening for other viral diseases: Secondary | ICD-10-CM | POA: Diagnosis not present

## 2019-09-01 DIAGNOSIS — Z20828 Contact with and (suspected) exposure to other viral communicable diseases: Secondary | ICD-10-CM | POA: Diagnosis not present

## 2019-09-04 DIAGNOSIS — Z20828 Contact with and (suspected) exposure to other viral communicable diseases: Secondary | ICD-10-CM | POA: Diagnosis not present

## 2019-09-04 DIAGNOSIS — Z1159 Encounter for screening for other viral diseases: Secondary | ICD-10-CM | POA: Diagnosis not present

## 2019-09-08 DIAGNOSIS — Z20828 Contact with and (suspected) exposure to other viral communicable diseases: Secondary | ICD-10-CM | POA: Diagnosis not present

## 2019-09-08 DIAGNOSIS — Z1159 Encounter for screening for other viral diseases: Secondary | ICD-10-CM | POA: Diagnosis not present

## 2019-09-11 DIAGNOSIS — Z20828 Contact with and (suspected) exposure to other viral communicable diseases: Secondary | ICD-10-CM | POA: Diagnosis not present

## 2019-09-11 DIAGNOSIS — Z1159 Encounter for screening for other viral diseases: Secondary | ICD-10-CM | POA: Diagnosis not present

## 2019-09-15 DIAGNOSIS — Z1159 Encounter for screening for other viral diseases: Secondary | ICD-10-CM | POA: Diagnosis not present

## 2019-09-15 DIAGNOSIS — Z20828 Contact with and (suspected) exposure to other viral communicable diseases: Secondary | ICD-10-CM | POA: Diagnosis not present

## 2019-09-18 DIAGNOSIS — Z1159 Encounter for screening for other viral diseases: Secondary | ICD-10-CM | POA: Diagnosis not present

## 2019-09-18 DIAGNOSIS — Z20828 Contact with and (suspected) exposure to other viral communicable diseases: Secondary | ICD-10-CM | POA: Diagnosis not present

## 2019-09-22 DIAGNOSIS — Z20828 Contact with and (suspected) exposure to other viral communicable diseases: Secondary | ICD-10-CM | POA: Diagnosis not present

## 2019-09-22 DIAGNOSIS — Z1159 Encounter for screening for other viral diseases: Secondary | ICD-10-CM | POA: Diagnosis not present

## 2019-09-25 DIAGNOSIS — Z20828 Contact with and (suspected) exposure to other viral communicable diseases: Secondary | ICD-10-CM | POA: Diagnosis not present

## 2019-09-25 DIAGNOSIS — Z1159 Encounter for screening for other viral diseases: Secondary | ICD-10-CM | POA: Diagnosis not present

## 2019-09-29 DIAGNOSIS — Z1159 Encounter for screening for other viral diseases: Secondary | ICD-10-CM | POA: Diagnosis not present

## 2019-09-29 DIAGNOSIS — Z20828 Contact with and (suspected) exposure to other viral communicable diseases: Secondary | ICD-10-CM | POA: Diagnosis not present

## 2019-10-02 DIAGNOSIS — Z20828 Contact with and (suspected) exposure to other viral communicable diseases: Secondary | ICD-10-CM | POA: Diagnosis not present

## 2019-10-02 DIAGNOSIS — Z1159 Encounter for screening for other viral diseases: Secondary | ICD-10-CM | POA: Diagnosis not present

## 2019-10-06 DIAGNOSIS — Z1159 Encounter for screening for other viral diseases: Secondary | ICD-10-CM | POA: Diagnosis not present

## 2019-10-06 DIAGNOSIS — Z20828 Contact with and (suspected) exposure to other viral communicable diseases: Secondary | ICD-10-CM | POA: Diagnosis not present

## 2019-10-09 DIAGNOSIS — Z20828 Contact with and (suspected) exposure to other viral communicable diseases: Secondary | ICD-10-CM | POA: Diagnosis not present

## 2019-10-09 DIAGNOSIS — Z1159 Encounter for screening for other viral diseases: Secondary | ICD-10-CM | POA: Diagnosis not present

## 2019-10-13 DIAGNOSIS — Z20828 Contact with and (suspected) exposure to other viral communicable diseases: Secondary | ICD-10-CM | POA: Diagnosis not present

## 2019-10-13 DIAGNOSIS — Z1159 Encounter for screening for other viral diseases: Secondary | ICD-10-CM | POA: Diagnosis not present

## 2019-10-16 DIAGNOSIS — Z20828 Contact with and (suspected) exposure to other viral communicable diseases: Secondary | ICD-10-CM | POA: Diagnosis not present

## 2019-10-16 DIAGNOSIS — Z1159 Encounter for screening for other viral diseases: Secondary | ICD-10-CM | POA: Diagnosis not present

## 2019-11-13 DIAGNOSIS — Z20828 Contact with and (suspected) exposure to other viral communicable diseases: Secondary | ICD-10-CM | POA: Diagnosis not present

## 2019-11-13 DIAGNOSIS — Z1159 Encounter for screening for other viral diseases: Secondary | ICD-10-CM | POA: Diagnosis not present

## 2019-11-17 DIAGNOSIS — Z20828 Contact with and (suspected) exposure to other viral communicable diseases: Secondary | ICD-10-CM | POA: Diagnosis not present

## 2019-11-17 DIAGNOSIS — Z1159 Encounter for screening for other viral diseases: Secondary | ICD-10-CM | POA: Diagnosis not present

## 2019-11-20 DIAGNOSIS — Z1159 Encounter for screening for other viral diseases: Secondary | ICD-10-CM | POA: Diagnosis not present

## 2019-11-20 DIAGNOSIS — Z20828 Contact with and (suspected) exposure to other viral communicable diseases: Secondary | ICD-10-CM | POA: Diagnosis not present

## 2019-11-24 DIAGNOSIS — Z1159 Encounter for screening for other viral diseases: Secondary | ICD-10-CM | POA: Diagnosis not present

## 2019-11-24 DIAGNOSIS — Z20828 Contact with and (suspected) exposure to other viral communicable diseases: Secondary | ICD-10-CM | POA: Diagnosis not present

## 2019-11-27 DIAGNOSIS — Z20828 Contact with and (suspected) exposure to other viral communicable diseases: Secondary | ICD-10-CM | POA: Diagnosis not present

## 2019-11-27 DIAGNOSIS — Z1159 Encounter for screening for other viral diseases: Secondary | ICD-10-CM | POA: Diagnosis not present

## 2019-12-01 DIAGNOSIS — Z20828 Contact with and (suspected) exposure to other viral communicable diseases: Secondary | ICD-10-CM | POA: Diagnosis not present

## 2019-12-01 DIAGNOSIS — Z1159 Encounter for screening for other viral diseases: Secondary | ICD-10-CM | POA: Diagnosis not present

## 2019-12-04 DIAGNOSIS — Z1159 Encounter for screening for other viral diseases: Secondary | ICD-10-CM | POA: Diagnosis not present

## 2019-12-04 DIAGNOSIS — Z20828 Contact with and (suspected) exposure to other viral communicable diseases: Secondary | ICD-10-CM | POA: Diagnosis not present

## 2019-12-08 DIAGNOSIS — Z1159 Encounter for screening for other viral diseases: Secondary | ICD-10-CM | POA: Diagnosis not present

## 2019-12-08 DIAGNOSIS — Z20828 Contact with and (suspected) exposure to other viral communicable diseases: Secondary | ICD-10-CM | POA: Diagnosis not present

## 2019-12-15 DIAGNOSIS — Z1159 Encounter for screening for other viral diseases: Secondary | ICD-10-CM | POA: Diagnosis not present

## 2019-12-15 DIAGNOSIS — Z20828 Contact with and (suspected) exposure to other viral communicable diseases: Secondary | ICD-10-CM | POA: Diagnosis not present

## 2019-12-18 DIAGNOSIS — Z1159 Encounter for screening for other viral diseases: Secondary | ICD-10-CM | POA: Diagnosis not present

## 2019-12-18 DIAGNOSIS — Z20828 Contact with and (suspected) exposure to other viral communicable diseases: Secondary | ICD-10-CM | POA: Diagnosis not present

## 2019-12-22 DIAGNOSIS — Z20828 Contact with and (suspected) exposure to other viral communicable diseases: Secondary | ICD-10-CM | POA: Diagnosis not present

## 2019-12-22 DIAGNOSIS — Z1159 Encounter for screening for other viral diseases: Secondary | ICD-10-CM | POA: Diagnosis not present

## 2019-12-25 DIAGNOSIS — Z20828 Contact with and (suspected) exposure to other viral communicable diseases: Secondary | ICD-10-CM | POA: Diagnosis not present

## 2019-12-25 DIAGNOSIS — Z1159 Encounter for screening for other viral diseases: Secondary | ICD-10-CM | POA: Diagnosis not present

## 2019-12-29 DIAGNOSIS — Z20828 Contact with and (suspected) exposure to other viral communicable diseases: Secondary | ICD-10-CM | POA: Diagnosis not present

## 2019-12-29 DIAGNOSIS — Z1159 Encounter for screening for other viral diseases: Secondary | ICD-10-CM | POA: Diagnosis not present

## 2020-01-01 DIAGNOSIS — Z20828 Contact with and (suspected) exposure to other viral communicable diseases: Secondary | ICD-10-CM | POA: Diagnosis not present

## 2020-01-01 DIAGNOSIS — Z1159 Encounter for screening for other viral diseases: Secondary | ICD-10-CM | POA: Diagnosis not present

## 2020-01-05 DIAGNOSIS — Z20828 Contact with and (suspected) exposure to other viral communicable diseases: Secondary | ICD-10-CM | POA: Diagnosis not present

## 2020-01-05 DIAGNOSIS — Z1159 Encounter for screening for other viral diseases: Secondary | ICD-10-CM | POA: Diagnosis not present

## 2020-01-08 DIAGNOSIS — Z20828 Contact with and (suspected) exposure to other viral communicable diseases: Secondary | ICD-10-CM | POA: Diagnosis not present

## 2020-01-08 DIAGNOSIS — Z1159 Encounter for screening for other viral diseases: Secondary | ICD-10-CM | POA: Diagnosis not present

## 2020-01-12 DIAGNOSIS — Z20828 Contact with and (suspected) exposure to other viral communicable diseases: Secondary | ICD-10-CM | POA: Diagnosis not present

## 2020-01-12 DIAGNOSIS — Z1159 Encounter for screening for other viral diseases: Secondary | ICD-10-CM | POA: Diagnosis not present

## 2020-01-15 DIAGNOSIS — Z20828 Contact with and (suspected) exposure to other viral communicable diseases: Secondary | ICD-10-CM | POA: Diagnosis not present

## 2020-01-15 DIAGNOSIS — Z1159 Encounter for screening for other viral diseases: Secondary | ICD-10-CM | POA: Diagnosis not present

## 2020-01-19 DIAGNOSIS — Z20828 Contact with and (suspected) exposure to other viral communicable diseases: Secondary | ICD-10-CM | POA: Diagnosis not present

## 2020-01-19 DIAGNOSIS — Z1159 Encounter for screening for other viral diseases: Secondary | ICD-10-CM | POA: Diagnosis not present

## 2020-01-21 ENCOUNTER — Ambulatory Visit (INDEPENDENT_AMBULATORY_CARE_PROVIDER_SITE_OTHER): Payer: Medicare Other | Admitting: Internal Medicine

## 2020-01-21 ENCOUNTER — Other Ambulatory Visit: Payer: Self-pay

## 2020-01-21 ENCOUNTER — Encounter: Payer: Self-pay | Admitting: Internal Medicine

## 2020-01-21 VITALS — BP 142/78 | HR 98 | Temp 98.2°F | Ht 65.0 in | Wt 155.0 lb

## 2020-01-21 DIAGNOSIS — R32 Unspecified urinary incontinence: Secondary | ICD-10-CM | POA: Insufficient documentation

## 2020-01-21 DIAGNOSIS — I10 Essential (primary) hypertension: Secondary | ICD-10-CM

## 2020-01-21 DIAGNOSIS — E538 Deficiency of other specified B group vitamins: Secondary | ICD-10-CM | POA: Diagnosis not present

## 2020-01-21 DIAGNOSIS — I6302 Cerebral infarction due to thrombosis of basilar artery: Secondary | ICD-10-CM

## 2020-01-21 DIAGNOSIS — Z23 Encounter for immunization: Secondary | ICD-10-CM

## 2020-01-21 DIAGNOSIS — I6350 Cerebral infarction due to unspecified occlusion or stenosis of unspecified cerebral artery: Secondary | ICD-10-CM | POA: Diagnosis not present

## 2020-01-21 NOTE — Addendum Note (Signed)
Addended by: Hazle Quant on: 01/21/2020 02:43 PM   Modules accepted: Orders

## 2020-01-21 NOTE — Assessment & Plan Note (Signed)
in a w/c all the time

## 2020-01-21 NOTE — Addendum Note (Signed)
Addended by: Lauralee Evener C on: 01/21/2020 02:30 PM   Modules accepted: Orders

## 2020-01-21 NOTE — Assessment & Plan Note (Signed)
No relapse 

## 2020-01-21 NOTE — Assessment & Plan Note (Signed)
BP Readings from Last 3 Encounters:  01/21/20 (!) 142/78  07/21/19 140/88  07/04/18 126/68

## 2020-01-21 NOTE — Assessment & Plan Note (Signed)
On B12 

## 2020-01-21 NOTE — Progress Notes (Signed)
Subjective:  Patient ID: Theresa Hampton, female    DOB: Oct 01, 1919  Age: 86 y.o. MRN: [id number]  CC: No chief complaint on file.   HPI BERNIS STECHER presents for HTN, dyslipidemia, CVA f/u C/o incontinence at night - new swelling in the leg is swollen and hurt so for your chart you have your right bothering you we are looking at now a little over a week vaccine 10 days 9 12/14/2008 looks older than 9 days well may be a basal cell swollen is swollen is much and I could not for now  Patient confused on this leg wound VAC. Yes I did for patient put him put on chronic is for bruising he has a  Outpatient Medications Prior to Visit  Medication Sig Dispense Refill  . acetaminophen (TYLENOL) 325 MG tablet Take 2 tablets (650 mg total) by mouth every 6 (six) hours as needed.    Marland Kitchen atorvastatin (LIPITOR) 40 MG tablet Take 1 tablet (40 mg total) by mouth daily at 6 PM. 30 tablet 0  . cholecalciferol (VITAMIN D) 400 units TABS tablet Take 400 Units by mouth.    . clopidogrel (PLAVIX) 75 MG tablet Take 1 tablet (75 mg total) by mouth daily. 30 tablet 0  . diclofenac sodium (VOLTAREN) 1 % GEL Apply 2 g topically 4 (four) times daily. 4 Tube 0  . fluticasone (FLONASE) 50 MCG/ACT nasal spray Place into both nostrils daily.    . furosemide (LASIX) 20 MG tablet Take 1 tablet (20 mg total) by mouth daily. 30 tablet 5  . gabapentin (NEURONTIN) 100 MG capsule TAKE 1 CAPSULE BY MOUTH AT BEDTIME 30 capsule 1  . loratadine (CLARITIN) 10 MG tablet Take 1 tablet (10 mg total) by mouth daily. 100 tablet 2  . LUMIGAN 0.01 % SOLN instill 1 drop IN [facility] EYE AT BEDTIME 7.5 mL 0  . Menthol-Methyl Salicylate (MUSCLE RUB) 10-15 % CREA Apply 1 application topically 2 (two) times daily as needed for muscle pain (BACK PAIN).  0  . mirtazapine (REMERON) 15 MG tablet TAKE ONE TABLET BY MOUTH EVERY EVENING BEFORE DINNER AT 4 - 5 PM 30 tablet 1  . Multiple Vitamins-Iron (MULTIVITAMINS WITH IRON) TABS tablet Take 1 tablet by  mouth daily.  0  . potassium chloride (KLOR-CON 10) 10 MEQ tablet Take 1 tablet (10 mEq total) by mouth daily. 30 tablet 5  . senna-docusate (SENOKOT-S) 8.6-50 MG tablet Take 1 tablet by mouth at bedtime as needed for moderate constipation. 30 tablet 0  . tolterodine (DETROL LA) 4 MG 24 hr capsule Take 1 capsule (4 mg total) by mouth daily. 30 capsule 11  . traMADol (ULTRAM) 50 MG tablet Take 1 tablet (50 mg total) by mouth every 12 (twelve) hours as needed for moderate pain or severe pain. 180 tablet 1  . traZODone (DESYREL) 50 MG tablet Take 0.5 tablets (25 mg total) by mouth at bedtime. 15 tablet 3  . vitamin B-12 (CYANOCOBALAMIN) 100 MCG tablet Take 100 mcg by mouth daily.     No facility-administered medications prior to visit.    ROS: Review of Systems  Constitutional: Negative for activity change, appetite change, chills, fatigue and unexpected weight change.  HENT: Negative for congestion, mouth sores and sinus pressure.   Eyes: Negative for visual disturbance.  Respiratory: Negative for cough and chest tightness.   Gastrointestinal: Negative for abdominal pain and nausea.  Genitourinary: Positive for urgency. Negative for difficulty urinating, frequency and vaginal pain.  Musculoskeletal: Negative for  back pain and gait problem.  Skin: Negative for pallor and rash.  Neurological: Negative for dizziness, tremors, weakness, numbness and headaches.  Psychiatric/Behavioral: Negative for confusion and sleep disturbance.    Objective:  BP (!) 142/78 (BP Location: Right Arm, Patient Position: Sitting, Cuff Size: Normal)   Pulse 98   Temp 98.2 F (36.8 C) (Oral)   Ht 5\' 5"  (1.651 m)   Wt 155 lb (70.3 kg)   LMP  (LMP Unknown)   SpO2 95%   BMI 25.79 kg/m   BP Readings from Last 3 Encounters:  01/21/20 (!) 142/78  07/21/19 140/88  07/04/18 126/68    Wt Readings from Last 3 Encounters:  01/21/20 155 lb (70.3 kg)  07/21/19 155 lb (70.3 kg)  07/04/18 146 lb (66.2 kg)     Physical Exam Constitutional:      General: She is not in acute distress.    Appearance: She is well-developed.  HENT:     Head: Normocephalic.     Right Ear: External ear normal.     Left Ear: External ear normal.     Nose: Nose normal.  Eyes:     General:        Right eye: No discharge.        Left eye: No discharge.     Conjunctiva/sclera: Conjunctivae normal.     Pupils: Pupils are equal, round, and reactive to light.  Neck:     Thyroid: No thyromegaly.     Vascular: No JVD.     Trachea: No tracheal deviation.  Cardiovascular:     Rate and Rhythm: Normal rate and regular rhythm.     Heart sounds: Normal heart sounds.  Pulmonary:     Effort: No respiratory distress.     Breath sounds: No stridor. No wheezing.  Abdominal:     General: Bowel sounds are normal. There is no distension.     Palpations: Abdomen is soft. There is no mass.     Tenderness: There is no abdominal tenderness. There is no guarding or rebound.  Musculoskeletal:        General: No tenderness.     Cervical back: Normal range of motion and neck supple.  Lymphadenopathy:     Cervical: No cervical adenopathy.  Skin:    Findings: No erythema or rash.  Neurological:     Cranial Nerves: No cranial nerve deficit.     Motor: No abnormal muscle tone.     Coordination: Coordination abnormal.     Gait: Gait abnormal.     Deep Tendon Reflexes: Reflexes normal.  Psychiatric:        Behavior: Behavior normal.        Thought Content: Thought content normal.        Judgment: Judgment normal.   in a w/c all the time  Lab Results  Component Value Date   WBC 6.9 07/21/2019   HGB 11.7 (L) 07/21/2019   HCT 35.5 (L) 07/21/2019   PLT 260.0 07/21/2019   GLUCOSE 104 (H) 07/21/2019   CHOL 168 07/21/2019   TRIG 90.0 07/21/2019   HDL 60.70 07/21/2019   LDLDIRECT 125.4 05/10/2011   LDLCALC 90 07/21/2019   ALT 15 07/21/2019   AST 19 07/21/2019   NA 140 07/21/2019   K 4.0 07/21/2019   CL 103 07/21/2019    CREATININE 0.95 07/21/2019   BUN 12 07/21/2019   CO2 28 07/21/2019   TSH 2.59 07/21/2019   INR 1.01 05/06/2017   HGBA1C 5.3 05/07/2017  No results found.  Assessment & Plan:    Walker Kehr, MD

## 2020-01-21 NOTE — Assessment & Plan Note (Signed)
On Detrol LA 9/21 worse UA Consider Ditropan

## 2020-01-22 DIAGNOSIS — Z1159 Encounter for screening for other viral diseases: Secondary | ICD-10-CM | POA: Diagnosis not present

## 2020-01-22 DIAGNOSIS — Z20828 Contact with and (suspected) exposure to other viral communicable diseases: Secondary | ICD-10-CM | POA: Diagnosis not present

## 2020-01-22 LAB — BASIC METABOLIC PANEL WITH GFR
BUN/Creatinine Ratio: 14 (calc) (ref 6–22)
BUN: 15 mg/dL (ref 7–25)
CO2: 28 mmol/L (ref 20–32)
Calcium: 8.9 mg/dL (ref 8.6–10.4)
Chloride: 104 mmol/L (ref 98–110)
Creat: 1.07 mg/dL — ABNORMAL HIGH (ref 0.60–0.88)
GFR, Est African American: 50 mL/min/{1.73_m2} — ABNORMAL LOW (ref >=60)
GFR, Est Non African American: 43 mL/min/{1.73_m2} — ABNORMAL LOW (ref >=60)
Glucose, Bld: 141 mg/dL — ABNORMAL HIGH (ref 65–99)
Potassium: 3.9 mmol/L (ref 3.5–5.3)
Sodium: 141 mmol/L (ref 135–146)

## 2020-01-22 LAB — URINALYSIS

## 2020-01-22 LAB — TSH: TSH: 3.28 mIU/L (ref 0.40–4.50)

## 2020-01-26 DIAGNOSIS — Z1159 Encounter for screening for other viral diseases: Secondary | ICD-10-CM | POA: Diagnosis not present

## 2020-01-26 DIAGNOSIS — Z20828 Contact with and (suspected) exposure to other viral communicable diseases: Secondary | ICD-10-CM | POA: Diagnosis not present

## 2020-01-29 DIAGNOSIS — Z20828 Contact with and (suspected) exposure to other viral communicable diseases: Secondary | ICD-10-CM | POA: Diagnosis not present

## 2020-01-29 DIAGNOSIS — Z1159 Encounter for screening for other viral diseases: Secondary | ICD-10-CM | POA: Diagnosis not present

## 2020-02-02 DIAGNOSIS — Z20828 Contact with and (suspected) exposure to other viral communicable diseases: Secondary | ICD-10-CM | POA: Diagnosis not present

## 2020-02-02 DIAGNOSIS — Z1159 Encounter for screening for other viral diseases: Secondary | ICD-10-CM | POA: Diagnosis not present

## 2020-02-05 DIAGNOSIS — Z1159 Encounter for screening for other viral diseases: Secondary | ICD-10-CM | POA: Diagnosis not present

## 2020-02-05 DIAGNOSIS — Z20828 Contact with and (suspected) exposure to other viral communicable diseases: Secondary | ICD-10-CM | POA: Diagnosis not present

## 2020-02-09 DIAGNOSIS — Z20828 Contact with and (suspected) exposure to other viral communicable diseases: Secondary | ICD-10-CM | POA: Diagnosis not present

## 2020-02-09 DIAGNOSIS — Z1159 Encounter for screening for other viral diseases: Secondary | ICD-10-CM | POA: Diagnosis not present

## 2020-02-12 DIAGNOSIS — Z1159 Encounter for screening for other viral diseases: Secondary | ICD-10-CM | POA: Diagnosis not present

## 2020-02-12 DIAGNOSIS — Z20828 Contact with and (suspected) exposure to other viral communicable diseases: Secondary | ICD-10-CM | POA: Diagnosis not present

## 2020-02-16 DIAGNOSIS — Z20828 Contact with and (suspected) exposure to other viral communicable diseases: Secondary | ICD-10-CM | POA: Diagnosis not present

## 2020-02-16 DIAGNOSIS — Z1159 Encounter for screening for other viral diseases: Secondary | ICD-10-CM | POA: Diagnosis not present

## 2020-02-19 DIAGNOSIS — Z1159 Encounter for screening for other viral diseases: Secondary | ICD-10-CM | POA: Diagnosis not present

## 2020-02-19 DIAGNOSIS — Z20828 Contact with and (suspected) exposure to other viral communicable diseases: Secondary | ICD-10-CM | POA: Diagnosis not present

## 2020-02-23 DIAGNOSIS — Z1159 Encounter for screening for other viral diseases: Secondary | ICD-10-CM | POA: Diagnosis not present

## 2020-02-23 DIAGNOSIS — Z20828 Contact with and (suspected) exposure to other viral communicable diseases: Secondary | ICD-10-CM | POA: Diagnosis not present

## 2020-02-26 DIAGNOSIS — Z1159 Encounter for screening for other viral diseases: Secondary | ICD-10-CM | POA: Diagnosis not present

## 2020-02-26 DIAGNOSIS — Z20828 Contact with and (suspected) exposure to other viral communicable diseases: Secondary | ICD-10-CM | POA: Diagnosis not present

## 2020-03-01 DIAGNOSIS — Z1159 Encounter for screening for other viral diseases: Secondary | ICD-10-CM | POA: Diagnosis not present

## 2020-03-01 DIAGNOSIS — Z20828 Contact with and (suspected) exposure to other viral communicable diseases: Secondary | ICD-10-CM | POA: Diagnosis not present

## 2020-03-04 DIAGNOSIS — Z20828 Contact with and (suspected) exposure to other viral communicable diseases: Secondary | ICD-10-CM | POA: Diagnosis not present

## 2020-03-04 DIAGNOSIS — Z1159 Encounter for screening for other viral diseases: Secondary | ICD-10-CM | POA: Diagnosis not present

## 2020-03-08 DIAGNOSIS — Z1159 Encounter for screening for other viral diseases: Secondary | ICD-10-CM | POA: Diagnosis not present

## 2020-03-08 DIAGNOSIS — Z20828 Contact with and (suspected) exposure to other viral communicable diseases: Secondary | ICD-10-CM | POA: Diagnosis not present

## 2020-03-11 DIAGNOSIS — Z1159 Encounter for screening for other viral diseases: Secondary | ICD-10-CM | POA: Diagnosis not present

## 2020-03-11 DIAGNOSIS — Z20828 Contact with and (suspected) exposure to other viral communicable diseases: Secondary | ICD-10-CM | POA: Diagnosis not present

## 2020-03-15 DIAGNOSIS — Z20828 Contact with and (suspected) exposure to other viral communicable diseases: Secondary | ICD-10-CM | POA: Diagnosis not present

## 2020-03-15 DIAGNOSIS — Z1159 Encounter for screening for other viral diseases: Secondary | ICD-10-CM | POA: Diagnosis not present

## 2020-03-18 DIAGNOSIS — Z1159 Encounter for screening for other viral diseases: Secondary | ICD-10-CM | POA: Diagnosis not present

## 2020-03-18 DIAGNOSIS — Z20828 Contact with and (suspected) exposure to other viral communicable diseases: Secondary | ICD-10-CM | POA: Diagnosis not present

## 2020-03-22 DIAGNOSIS — Z1159 Encounter for screening for other viral diseases: Secondary | ICD-10-CM | POA: Diagnosis not present

## 2020-03-22 DIAGNOSIS — Z20828 Contact with and (suspected) exposure to other viral communicable diseases: Secondary | ICD-10-CM | POA: Diagnosis not present

## 2020-03-25 DIAGNOSIS — Z1159 Encounter for screening for other viral diseases: Secondary | ICD-10-CM | POA: Diagnosis not present

## 2020-03-25 DIAGNOSIS — M2041 Other hammer toe(s) (acquired), right foot: Secondary | ICD-10-CM | POA: Diagnosis not present

## 2020-03-25 DIAGNOSIS — M79674 Pain in right toe(s): Secondary | ICD-10-CM | POA: Diagnosis not present

## 2020-03-25 DIAGNOSIS — Z20828 Contact with and (suspected) exposure to other viral communicable diseases: Secondary | ICD-10-CM | POA: Diagnosis not present

## 2020-03-25 DIAGNOSIS — B351 Tinea unguium: Secondary | ICD-10-CM | POA: Diagnosis not present

## 2020-04-05 DIAGNOSIS — Z20828 Contact with and (suspected) exposure to other viral communicable diseases: Secondary | ICD-10-CM | POA: Diagnosis not present

## 2020-04-05 DIAGNOSIS — Z1159 Encounter for screening for other viral diseases: Secondary | ICD-10-CM | POA: Diagnosis not present

## 2020-04-08 DIAGNOSIS — Z20828 Contact with and (suspected) exposure to other viral communicable diseases: Secondary | ICD-10-CM | POA: Diagnosis not present

## 2020-04-08 DIAGNOSIS — Z1159 Encounter for screening for other viral diseases: Secondary | ICD-10-CM | POA: Diagnosis not present

## 2020-04-12 DIAGNOSIS — Z20828 Contact with and (suspected) exposure to other viral communicable diseases: Secondary | ICD-10-CM | POA: Diagnosis not present

## 2020-04-12 DIAGNOSIS — Z1159 Encounter for screening for other viral diseases: Secondary | ICD-10-CM | POA: Diagnosis not present

## 2020-04-19 DIAGNOSIS — Z20828 Contact with and (suspected) exposure to other viral communicable diseases: Secondary | ICD-10-CM | POA: Diagnosis not present

## 2020-04-19 DIAGNOSIS — Z1159 Encounter for screening for other viral diseases: Secondary | ICD-10-CM | POA: Diagnosis not present

## 2020-04-22 DIAGNOSIS — Z20828 Contact with and (suspected) exposure to other viral communicable diseases: Secondary | ICD-10-CM | POA: Diagnosis not present

## 2020-04-22 DIAGNOSIS — Z1159 Encounter for screening for other viral diseases: Secondary | ICD-10-CM | POA: Diagnosis not present

## 2020-04-26 DIAGNOSIS — Z20828 Contact with and (suspected) exposure to other viral communicable diseases: Secondary | ICD-10-CM | POA: Diagnosis not present

## 2020-04-26 DIAGNOSIS — Z1159 Encounter for screening for other viral diseases: Secondary | ICD-10-CM | POA: Diagnosis not present

## 2020-04-29 DIAGNOSIS — Z1159 Encounter for screening for other viral diseases: Secondary | ICD-10-CM | POA: Diagnosis not present

## 2020-04-29 DIAGNOSIS — Z20828 Contact with and (suspected) exposure to other viral communicable diseases: Secondary | ICD-10-CM | POA: Diagnosis not present

## 2020-05-03 DIAGNOSIS — Z1159 Encounter for screening for other viral diseases: Secondary | ICD-10-CM | POA: Diagnosis not present

## 2020-05-03 DIAGNOSIS — Z20828 Contact with and (suspected) exposure to other viral communicable diseases: Secondary | ICD-10-CM | POA: Diagnosis not present

## 2020-05-06 DIAGNOSIS — Z20828 Contact with and (suspected) exposure to other viral communicable diseases: Secondary | ICD-10-CM | POA: Diagnosis not present

## 2020-05-06 DIAGNOSIS — Z1159 Encounter for screening for other viral diseases: Secondary | ICD-10-CM | POA: Diagnosis not present

## 2020-05-10 DIAGNOSIS — Z1159 Encounter for screening for other viral diseases: Secondary | ICD-10-CM | POA: Diagnosis not present

## 2020-05-10 DIAGNOSIS — Z20828 Contact with and (suspected) exposure to other viral communicable diseases: Secondary | ICD-10-CM | POA: Diagnosis not present

## 2020-05-11 ENCOUNTER — Telehealth: Payer: Self-pay | Admitting: Internal Medicine

## 2020-05-11 NOTE — Telephone Encounter (Signed)
Receive fax this am.. Place on MD desk.Marland KitchenRaechel Chute

## 2020-05-11 NOTE — Telephone Encounter (Signed)
   Bukky from [facility] at [place] [phone], calling to request completed FL2 be faxed back. Fax [phone]

## 2020-05-12 NOTE — Telephone Encounter (Signed)
Done. Thanks.

## 2020-05-12 NOTE — Telephone Encounter (Signed)
Tried to fax back on several occasion... fax will not go through. Will retry later.Marland KitchenRaechel Hampton

## 2020-05-13 DIAGNOSIS — Z20828 Contact with and (suspected) exposure to other viral communicable diseases: Secondary | ICD-10-CM | POA: Diagnosis not present

## 2020-05-13 DIAGNOSIS — Z1159 Encounter for screening for other viral diseases: Secondary | ICD-10-CM | POA: Diagnosis not present

## 2020-05-13 NOTE — Telephone Encounter (Signed)
Follow up message  Allena Earing from [facility] provided new fax number Please resend to fax [phone]

## 2020-05-13 NOTE — Telephone Encounter (Signed)
Refaxed to new #. Received confirmation it went through.Marland KitchenRaechel Chute

## 2020-05-17 DIAGNOSIS — Z20828 Contact with and (suspected) exposure to other viral communicable diseases: Secondary | ICD-10-CM | POA: Diagnosis not present

## 2020-05-17 DIAGNOSIS — Z1159 Encounter for screening for other viral diseases: Secondary | ICD-10-CM | POA: Diagnosis not present

## 2020-05-20 DIAGNOSIS — Z1159 Encounter for screening for other viral diseases: Secondary | ICD-10-CM | POA: Diagnosis not present

## 2020-05-20 DIAGNOSIS — Z20828 Contact with and (suspected) exposure to other viral communicable diseases: Secondary | ICD-10-CM | POA: Diagnosis not present

## 2020-05-24 DIAGNOSIS — Z1159 Encounter for screening for other viral diseases: Secondary | ICD-10-CM | POA: Diagnosis not present

## 2020-05-24 DIAGNOSIS — Z20828 Contact with and (suspected) exposure to other viral communicable diseases: Secondary | ICD-10-CM | POA: Diagnosis not present

## 2020-05-27 DIAGNOSIS — Z1159 Encounter for screening for other viral diseases: Secondary | ICD-10-CM | POA: Diagnosis not present

## 2020-05-27 DIAGNOSIS — Z20828 Contact with and (suspected) exposure to other viral communicable diseases: Secondary | ICD-10-CM | POA: Diagnosis not present

## 2020-05-31 DIAGNOSIS — Z1159 Encounter for screening for other viral diseases: Secondary | ICD-10-CM | POA: Diagnosis not present

## 2020-05-31 DIAGNOSIS — Z20828 Contact with and (suspected) exposure to other viral communicable diseases: Secondary | ICD-10-CM | POA: Diagnosis not present

## 2020-06-03 DIAGNOSIS — Z1159 Encounter for screening for other viral diseases: Secondary | ICD-10-CM | POA: Diagnosis not present

## 2020-06-03 DIAGNOSIS — Z20828 Contact with and (suspected) exposure to other viral communicable diseases: Secondary | ICD-10-CM | POA: Diagnosis not present

## 2020-06-07 DIAGNOSIS — Z20828 Contact with and (suspected) exposure to other viral communicable diseases: Secondary | ICD-10-CM | POA: Diagnosis not present

## 2020-06-07 DIAGNOSIS — Z1159 Encounter for screening for other viral diseases: Secondary | ICD-10-CM | POA: Diagnosis not present

## 2020-06-10 DIAGNOSIS — Z20828 Contact with and (suspected) exposure to other viral communicable diseases: Secondary | ICD-10-CM | POA: Diagnosis not present

## 2020-06-10 DIAGNOSIS — Z1159 Encounter for screening for other viral diseases: Secondary | ICD-10-CM | POA: Diagnosis not present

## 2020-06-14 DIAGNOSIS — Z20828 Contact with and (suspected) exposure to other viral communicable diseases: Secondary | ICD-10-CM | POA: Diagnosis not present

## 2020-06-14 DIAGNOSIS — Z1159 Encounter for screening for other viral diseases: Secondary | ICD-10-CM | POA: Diagnosis not present

## 2020-06-17 DIAGNOSIS — Z20828 Contact with and (suspected) exposure to other viral communicable diseases: Secondary | ICD-10-CM | POA: Diagnosis not present

## 2020-06-17 DIAGNOSIS — Z1159 Encounter for screening for other viral diseases: Secondary | ICD-10-CM | POA: Diagnosis not present

## 2020-06-21 DIAGNOSIS — Z1159 Encounter for screening for other viral diseases: Secondary | ICD-10-CM | POA: Diagnosis not present

## 2020-06-21 DIAGNOSIS — Z20828 Contact with and (suspected) exposure to other viral communicable diseases: Secondary | ICD-10-CM | POA: Diagnosis not present

## 2020-06-24 DIAGNOSIS — Z1159 Encounter for screening for other viral diseases: Secondary | ICD-10-CM | POA: Diagnosis not present

## 2020-06-24 DIAGNOSIS — Z20828 Contact with and (suspected) exposure to other viral communicable diseases: Secondary | ICD-10-CM | POA: Diagnosis not present

## 2020-06-28 DIAGNOSIS — Z1159 Encounter for screening for other viral diseases: Secondary | ICD-10-CM | POA: Diagnosis not present

## 2020-06-28 DIAGNOSIS — Z20828 Contact with and (suspected) exposure to other viral communicable diseases: Secondary | ICD-10-CM | POA: Diagnosis not present

## 2020-07-01 DIAGNOSIS — Z20828 Contact with and (suspected) exposure to other viral communicable diseases: Secondary | ICD-10-CM | POA: Diagnosis not present

## 2020-07-01 DIAGNOSIS — Z1159 Encounter for screening for other viral diseases: Secondary | ICD-10-CM | POA: Diagnosis not present

## 2020-07-05 DIAGNOSIS — Z1159 Encounter for screening for other viral diseases: Secondary | ICD-10-CM | POA: Diagnosis not present

## 2020-07-05 DIAGNOSIS — Z20828 Contact with and (suspected) exposure to other viral communicable diseases: Secondary | ICD-10-CM | POA: Diagnosis not present

## 2020-07-08 ENCOUNTER — Other Ambulatory Visit: Payer: Self-pay | Admitting: Internal Medicine

## 2020-07-08 ENCOUNTER — Ambulatory Visit (INDEPENDENT_AMBULATORY_CARE_PROVIDER_SITE_OTHER): Payer: Medicare Other | Admitting: Internal Medicine

## 2020-07-08 ENCOUNTER — Encounter: Payer: Self-pay | Admitting: Internal Medicine

## 2020-07-08 ENCOUNTER — Other Ambulatory Visit: Payer: Self-pay

## 2020-07-08 VITALS — BP 122/70 | HR 96 | Temp 98.1°F | Ht 65.0 in | Wt 150.8 lb

## 2020-07-08 DIAGNOSIS — N63 Unspecified lump in unspecified breast: Secondary | ICD-10-CM

## 2020-07-08 DIAGNOSIS — F411 Generalized anxiety disorder: Secondary | ICD-10-CM | POA: Diagnosis not present

## 2020-07-08 DIAGNOSIS — N6451 Induration of breast: Secondary | ICD-10-CM

## 2020-07-08 DIAGNOSIS — Z1159 Encounter for screening for other viral diseases: Secondary | ICD-10-CM | POA: Diagnosis not present

## 2020-07-08 DIAGNOSIS — C50912 Malignant neoplasm of unspecified site of left female breast: Secondary | ICD-10-CM | POA: Diagnosis not present

## 2020-07-08 DIAGNOSIS — Z20828 Contact with and (suspected) exposure to other viral communicable diseases: Secondary | ICD-10-CM | POA: Diagnosis not present

## 2020-07-08 DIAGNOSIS — I10 Essential (primary) hypertension: Secondary | ICD-10-CM | POA: Diagnosis not present

## 2020-07-08 DIAGNOSIS — E538 Deficiency of other specified B group vitamins: Secondary | ICD-10-CM

## 2020-07-08 DIAGNOSIS — E559 Vitamin D deficiency, unspecified: Secondary | ICD-10-CM | POA: Diagnosis not present

## 2020-07-08 NOTE — Progress Notes (Signed)
Subjective:  Patient ID: Theresa Hampton, female    DOB: 1919/09/01  Age: 87 y.o. MRN: [id number]  CC: Diabetes (6 month f/u)   HPI Theresa Hampton presents for L breast lump that the pt found last week F/u CAD, dyslipidemia  Outpatient Medications Prior to Visit  Medication Sig Dispense Refill  . acetaminophen (TYLENOL) 325 MG tablet Take 2 tablets (650 mg total) by mouth every 6 (six) hours as needed.    Marland Kitchen atorvastatin (LIPITOR) 40 MG tablet Take 1 tablet (40 mg total) by mouth daily at 6 PM. 30 tablet 0  . cholecalciferol (VITAMIN D) 400 units TABS tablet Take 400 Units by mouth.    . clopidogrel (PLAVIX) 75 MG tablet Take 1 tablet (75 mg total) by mouth daily. 30 tablet 0  . diclofenac sodium (VOLTAREN) 1 % GEL Apply 2 g topically 4 (four) times daily. 4 Tube 0  . fluticasone (FLONASE) 50 MCG/ACT nasal spray Place into both nostrils daily.    . furosemide (LASIX) 20 MG tablet Take 1 tablet (20 mg total) by mouth daily. 30 tablet 5  . gabapentin (NEURONTIN) 100 MG capsule TAKE 1 CAPSULE BY MOUTH AT BEDTIME 30 capsule 1  . loratadine (CLARITIN) 10 MG tablet Take 1 tablet (10 mg total) by mouth daily. 100 tablet 2  . LUMIGAN 0.01 % SOLN instill 1 drop IN [facility] EYE AT BEDTIME 7.5 mL 0  . Menthol-Methyl Salicylate (MUSCLE RUB) 10-15 % CREA Apply 1 application topically 2 (two) times daily as needed for muscle pain (BACK PAIN).  0  . mirtazapine (REMERON) 15 MG tablet TAKE ONE TABLET BY MOUTH EVERY EVENING BEFORE DINNER AT 4 - 5 PM 30 tablet 1  . Multiple Vitamins-Iron (MULTIVITAMINS WITH IRON) TABS tablet Take 1 tablet by mouth daily.  0  . potassium chloride (KLOR-CON 10) 10 MEQ tablet Take 1 tablet (10 mEq total) by mouth daily. 30 tablet 5  . senna-docusate (SENOKOT-S) 8.6-50 MG tablet Take 1 tablet by mouth at bedtime as needed for moderate constipation. 30 tablet 0  . tolterodine (DETROL LA) 4 MG 24 hr capsule Take 1 capsule (4 mg total) by mouth daily. 30 capsule 11  . traMADol  (ULTRAM) 50 MG tablet Take 1 tablet (50 mg total) by mouth every 12 (twelve) hours as needed for moderate pain or severe pain. 180 tablet 1  . traZODone (DESYREL) 50 MG tablet Take 0.5 tablets (25 mg total) by mouth at bedtime. 15 tablet 3  . vitamin B-12 (CYANOCOBALAMIN) 100 MCG tablet Take 100 mcg by mouth daily.     No facility-administered medications prior to visit.    ROS: Review of Systems  Constitutional: Positive for fatigue. Negative for activity change, appetite change, chills and unexpected weight change.  HENT: Negative for congestion, mouth sores and sinus pressure.   Eyes: Negative for visual disturbance.  Respiratory: Negative for cough and chest tightness.   Gastrointestinal: Negative for abdominal pain and nausea.  Genitourinary: Negative for difficulty urinating, frequency and vaginal pain.  Musculoskeletal: Negative for back pain and gait problem.  Skin: Negative for pallor and rash.  Neurological: Negative for dizziness, tremors, weakness, numbness and headaches.  Psychiatric/Behavioral: Positive for decreased concentration. Negative for confusion, sleep disturbance and suicidal ideas.    Objective:  BP 122/70 (BP Location: Left Arm)   Pulse 96   Temp 98.1 F (36.7 C) (Oral)   Ht 5\' 5"  (1.651 m)   Wt 150 lb 12.8 oz (68.4 kg)   LMP  (  LMP Unknown)   SpO2 95%   BMI 25.09 kg/m   BP Readings from Last 3 Encounters:  07/08/20 122/70  01/21/20 (!) 142/78  07/21/19 140/88    Wt Readings from Last 3 Encounters:  07/08/20 150 lb 12.8 oz (68.4 kg)  01/21/20 155 lb (70.3 kg)  07/21/19 155 lb (70.3 kg)    Physical Exam Constitutional:      General: She is not in acute distress.    Appearance: She is well-developed.  HENT:     Head: Normocephalic.     Right Ear: External ear normal.     Left Ear: External ear normal.     Nose: Nose normal.     Mouth/Throat:     Mouth: Oropharynx is clear and moist.  Eyes:     General:        Right eye: No discharge.         Left eye: No discharge.     Conjunctiva/sclera: Conjunctivae normal.     Pupils: Pupils are equal, round, and reactive to light.  Neck:     Thyroid: No thyromegaly.     Vascular: No JVD.     Trachea: No tracheal deviation.  Cardiovascular:     Rate and Rhythm: Normal rate and regular rhythm.     Heart sounds: Normal heart sounds.  Pulmonary:     Effort: No respiratory distress.     Breath sounds: No stridor. No wheezing.  Abdominal:     General: Bowel sounds are normal. There is no distension.     Palpations: Abdomen is soft. There is no mass.     Tenderness: There is no abdominal tenderness. There is no guarding or rebound.  Musculoskeletal:        General: Tenderness present. No edema.     Cervical back: Normal range of motion and neck supple.  Lymphadenopathy:     Cervical: No cervical adenopathy.  Skin:    Findings: No erythema or rash.  Neurological:     Cranial Nerves: No cranial nerve deficit.     Motor: Weakness present. No abnormal muscle tone.     Coordination: Coordination abnormal.     Gait: Gait abnormal.     Deep Tendon Reflexes: Reflexes normal.  Psychiatric:        Mood and Affect: Mood and affect normal.        Behavior: Behavior normal.        Thought Content: Thought content normal.    In a w/c L breast mass 15x12 cm, firm , no adenopathy Hard hearing  Lab Results  Component Value Date   WBC 6.9 07/21/2019   HGB 11.7 (L) 07/21/2019   HCT 35.5 (L) 07/21/2019   PLT 260.0 07/21/2019   GLUCOSE 141 (H) 01/21/2020   CHOL 168 07/21/2019   TRIG 90.0 07/21/2019   HDL 60.70 07/21/2019   LDLDIRECT 125.4 05/10/2011   LDLCALC 90 07/21/2019   ALT 15 07/21/2019   AST 19 07/21/2019   NA 141 01/21/2020   K 3.9 01/21/2020   CL 104 01/21/2020   CREATININE 1.07 (H) 01/21/2020   BUN 15 01/21/2020   CO2 28 01/21/2020   TSH 3.28 01/21/2020   INR 1.01 05/06/2017   HGBA1C 5.3 05/07/2017    No results found.  Assessment & Plan:   There are no  diagnoses linked to this encounter.   No orders of the defined types were placed in this encounter.    Follow-up: No follow-ups on file.  Alex Paislei Dorval,  MD

## 2020-07-08 NOTE — Assessment & Plan Note (Signed)
On B12 

## 2020-07-08 NOTE — Assessment & Plan Note (Signed)
Remeron Worse w/COVID isolation

## 2020-07-08 NOTE — Assessment & Plan Note (Addendum)
L breast mass 15x12 cm, firm , no adenopathy Diagn mammo/US/bx Discussed w/pt and dtr

## 2020-07-08 NOTE — Assessment & Plan Note (Signed)
BP Readings from Last 3 Encounters:  07/08/20 122/70  01/21/20 (!) 142/78  07/21/19 140/88   On Furosemide

## 2020-07-12 ENCOUNTER — Other Ambulatory Visit: Payer: Self-pay | Admitting: *Deleted

## 2020-07-12 MED ORDER — TRAMADOL HCL 50 MG PO TABS
50.0000 mg | ORAL_TABLET | Freq: Two times a day (BID) | ORAL | 1 refills | Status: DC | PRN
Start: 2020-07-12 — End: 2020-09-15

## 2020-07-13 DIAGNOSIS — M79674 Pain in right toe(s): Secondary | ICD-10-CM | POA: Diagnosis not present

## 2020-07-13 DIAGNOSIS — M2041 Other hammer toe(s) (acquired), right foot: Secondary | ICD-10-CM | POA: Diagnosis not present

## 2020-07-13 DIAGNOSIS — B351 Tinea unguium: Secondary | ICD-10-CM | POA: Diagnosis not present

## 2020-07-15 DIAGNOSIS — Z1159 Encounter for screening for other viral diseases: Secondary | ICD-10-CM | POA: Diagnosis not present

## 2020-07-15 DIAGNOSIS — Z20828 Contact with and (suspected) exposure to other viral communicable diseases: Secondary | ICD-10-CM | POA: Diagnosis not present

## 2020-07-19 ENCOUNTER — Telehealth: Payer: Self-pay | Admitting: Internal Medicine

## 2020-07-19 DIAGNOSIS — Z1159 Encounter for screening for other viral diseases: Secondary | ICD-10-CM | POA: Diagnosis not present

## 2020-07-19 DIAGNOSIS — Z20828 Contact with and (suspected) exposure to other viral communicable diseases: Secondary | ICD-10-CM | POA: Diagnosis not present

## 2020-07-19 NOTE — Telephone Encounter (Signed)
Called daughter back per chart that's look like what he is f/u on. So we reschedule appt til 08/26/20 after mammogram../lmb

## 2020-07-19 NOTE — Telephone Encounter (Signed)
Patient daughter calling, states they couldn't get the patient a appointment until April for the mammogram and biopsy. They are wondering if they still need the follow up appointment on the 24th or if they should wait until after she gets the testing done.

## 2020-07-20 ENCOUNTER — Ambulatory Visit: Payer: Medicare Other | Admitting: Internal Medicine

## 2020-07-22 DIAGNOSIS — Z1159 Encounter for screening for other viral diseases: Secondary | ICD-10-CM | POA: Diagnosis not present

## 2020-07-22 DIAGNOSIS — Z20828 Contact with and (suspected) exposure to other viral communicable diseases: Secondary | ICD-10-CM | POA: Diagnosis not present

## 2020-07-26 DIAGNOSIS — Z1159 Encounter for screening for other viral diseases: Secondary | ICD-10-CM | POA: Diagnosis not present

## 2020-07-26 DIAGNOSIS — Z20828 Contact with and (suspected) exposure to other viral communicable diseases: Secondary | ICD-10-CM | POA: Diagnosis not present

## 2020-07-27 ENCOUNTER — Ambulatory Visit: Payer: Medicare Other | Admitting: Internal Medicine

## 2020-07-29 ENCOUNTER — Ambulatory Visit: Payer: Medicare Other | Admitting: Internal Medicine

## 2020-07-29 DIAGNOSIS — Z20828 Contact with and (suspected) exposure to other viral communicable diseases: Secondary | ICD-10-CM | POA: Diagnosis not present

## 2020-07-29 DIAGNOSIS — Z1159 Encounter for screening for other viral diseases: Secondary | ICD-10-CM | POA: Diagnosis not present

## 2020-08-02 DIAGNOSIS — Z20828 Contact with and (suspected) exposure to other viral communicable diseases: Secondary | ICD-10-CM | POA: Diagnosis not present

## 2020-08-02 DIAGNOSIS — Z1159 Encounter for screening for other viral diseases: Secondary | ICD-10-CM | POA: Diagnosis not present

## 2020-08-05 DIAGNOSIS — Z20828 Contact with and (suspected) exposure to other viral communicable diseases: Secondary | ICD-10-CM | POA: Diagnosis not present

## 2020-08-05 DIAGNOSIS — Z1159 Encounter for screening for other viral diseases: Secondary | ICD-10-CM | POA: Diagnosis not present

## 2020-08-09 DIAGNOSIS — Z20828 Contact with and (suspected) exposure to other viral communicable diseases: Secondary | ICD-10-CM | POA: Diagnosis not present

## 2020-08-09 DIAGNOSIS — Z1159 Encounter for screening for other viral diseases: Secondary | ICD-10-CM | POA: Diagnosis not present

## 2020-08-12 DIAGNOSIS — Z1159 Encounter for screening for other viral diseases: Secondary | ICD-10-CM | POA: Diagnosis not present

## 2020-08-12 DIAGNOSIS — Z20828 Contact with and (suspected) exposure to other viral communicable diseases: Secondary | ICD-10-CM | POA: Diagnosis not present

## 2020-08-16 DIAGNOSIS — Z1159 Encounter for screening for other viral diseases: Secondary | ICD-10-CM | POA: Diagnosis not present

## 2020-08-16 DIAGNOSIS — Z20828 Contact with and (suspected) exposure to other viral communicable diseases: Secondary | ICD-10-CM | POA: Diagnosis not present

## 2020-08-19 DIAGNOSIS — Z1159 Encounter for screening for other viral diseases: Secondary | ICD-10-CM | POA: Diagnosis not present

## 2020-08-19 DIAGNOSIS — Z20828 Contact with and (suspected) exposure to other viral communicable diseases: Secondary | ICD-10-CM | POA: Diagnosis not present

## 2020-08-23 ENCOUNTER — Ambulatory Visit
Admission: RE | Admit: 2020-08-23 | Discharge: 2020-08-23 | Disposition: A | Discharge status: Home / Self Care | Payer: Medicare Other | Source: Ambulatory Visit | Attending: Internal Medicine | Admitting: Internal Medicine

## 2020-08-23 ENCOUNTER — Other Ambulatory Visit: Payer: Self-pay

## 2020-08-23 ENCOUNTER — Other Ambulatory Visit: Payer: Self-pay | Admitting: Internal Medicine

## 2020-08-23 DIAGNOSIS — R922 Inconclusive mammogram: Secondary | ICD-10-CM | POA: Diagnosis not present

## 2020-08-23 DIAGNOSIS — N63 Unspecified lump in unspecified breast: Secondary | ICD-10-CM

## 2020-08-23 DIAGNOSIS — N6451 Induration of breast: Secondary | ICD-10-CM

## 2020-08-23 DIAGNOSIS — N6321 Unspecified lump in the left breast, upper outer quadrant: Secondary | ICD-10-CM | POA: Diagnosis not present

## 2020-08-23 DIAGNOSIS — N6323 Unspecified lump in the left breast, lower outer quadrant: Secondary | ICD-10-CM | POA: Diagnosis not present

## 2020-08-26 ENCOUNTER — Ambulatory Visit: Payer: Medicare Other | Admitting: Internal Medicine

## 2020-08-26 ENCOUNTER — Other Ambulatory Visit: Payer: Self-pay

## 2020-08-26 ENCOUNTER — Other Ambulatory Visit: Payer: Medicare Other

## 2020-08-26 DIAGNOSIS — Z1159 Encounter for screening for other viral diseases: Secondary | ICD-10-CM | POA: Diagnosis not present

## 2020-08-26 DIAGNOSIS — Z20828 Contact with and (suspected) exposure to other viral communicable diseases: Secondary | ICD-10-CM | POA: Diagnosis not present

## 2020-08-27 ENCOUNTER — Ambulatory Visit: Payer: Medicare Other | Admitting: Internal Medicine

## 2020-08-30 DIAGNOSIS — Z20828 Contact with and (suspected) exposure to other viral communicable diseases: Secondary | ICD-10-CM | POA: Diagnosis not present

## 2020-08-30 DIAGNOSIS — Z1159 Encounter for screening for other viral diseases: Secondary | ICD-10-CM | POA: Diagnosis not present

## 2020-09-01 ENCOUNTER — Ambulatory Visit: Payer: Medicare Other | Admitting: Internal Medicine

## 2020-09-02 ENCOUNTER — Telehealth: Payer: Self-pay | Admitting: Internal Medicine

## 2020-09-02 DIAGNOSIS — Z1159 Encounter for screening for other viral diseases: Secondary | ICD-10-CM | POA: Diagnosis not present

## 2020-09-02 DIAGNOSIS — Z20828 Contact with and (suspected) exposure to other viral communicable diseases: Secondary | ICD-10-CM | POA: Diagnosis not present

## 2020-09-02 NOTE — Telephone Encounter (Signed)
Called to schedule AWV with [facility] and spoke to [place] patients daughter. She stated she will schedule this appt at a later time because her mom is going thought a lot right now.

## 2020-09-06 ENCOUNTER — Other Ambulatory Visit: Payer: Self-pay

## 2020-09-06 ENCOUNTER — Other Ambulatory Visit: Payer: Self-pay | Admitting: Internal Medicine

## 2020-09-06 ENCOUNTER — Other Ambulatory Visit (HOSPITAL_COMMUNITY)
Admission: RE | Admit: 2020-09-06 | Discharge: 2020-09-06 | Disposition: A | Discharge status: Home / Self Care | Payer: Medicare Other | Source: Ambulatory Visit | Attending: Internal Medicine | Admitting: Internal Medicine

## 2020-09-06 ENCOUNTER — Ambulatory Visit
Admission: RE | Admit: 2020-09-06 | Discharge: 2020-09-06 | Disposition: A | Discharge status: Home / Self Care | Payer: Medicare Other | Source: Ambulatory Visit | Attending: Internal Medicine | Admitting: Internal Medicine

## 2020-09-06 DIAGNOSIS — Z171 Estrogen receptor negative status [ER-]: Secondary | ICD-10-CM | POA: Diagnosis not present

## 2020-09-06 DIAGNOSIS — C50812 Malignant neoplasm of overlapping sites of left female breast: Secondary | ICD-10-CM | POA: Diagnosis not present

## 2020-09-06 DIAGNOSIS — C50912 Malignant neoplasm of unspecified site of left female breast: Secondary | ICD-10-CM | POA: Diagnosis not present

## 2020-09-06 DIAGNOSIS — N63 Unspecified lump in unspecified breast: Secondary | ICD-10-CM | POA: Diagnosis not present

## 2020-09-06 DIAGNOSIS — N6451 Induration of breast: Secondary | ICD-10-CM

## 2020-09-06 DIAGNOSIS — N6320 Unspecified lump in the left breast, unspecified quadrant: Secondary | ICD-10-CM | POA: Diagnosis not present

## 2020-09-06 DIAGNOSIS — Z20828 Contact with and (suspected) exposure to other viral communicable diseases: Secondary | ICD-10-CM | POA: Diagnosis not present

## 2020-09-06 DIAGNOSIS — Z1159 Encounter for screening for other viral diseases: Secondary | ICD-10-CM | POA: Diagnosis not present

## 2020-09-06 DIAGNOSIS — N6323 Unspecified lump in the left breast, lower outer quadrant: Secondary | ICD-10-CM | POA: Diagnosis not present

## 2020-09-07 LAB — CYTOLOGY - NON PAP

## 2020-09-09 DIAGNOSIS — Z1159 Encounter for screening for other viral diseases: Secondary | ICD-10-CM | POA: Diagnosis not present

## 2020-09-09 DIAGNOSIS — Z20828 Contact with and (suspected) exposure to other viral communicable diseases: Secondary | ICD-10-CM | POA: Diagnosis not present

## 2020-09-13 DIAGNOSIS — Z1159 Encounter for screening for other viral diseases: Secondary | ICD-10-CM | POA: Diagnosis not present

## 2020-09-13 DIAGNOSIS — Z20828 Contact with and (suspected) exposure to other viral communicable diseases: Secondary | ICD-10-CM | POA: Diagnosis not present

## 2020-09-15 ENCOUNTER — Other Ambulatory Visit: Payer: Self-pay

## 2020-09-15 ENCOUNTER — Encounter: Payer: Self-pay | Admitting: Internal Medicine

## 2020-09-15 ENCOUNTER — Ambulatory Visit (INDEPENDENT_AMBULATORY_CARE_PROVIDER_SITE_OTHER): Payer: Medicare Other | Admitting: Internal Medicine

## 2020-09-15 DIAGNOSIS — E559 Vitamin D deficiency, unspecified: Secondary | ICD-10-CM

## 2020-09-15 DIAGNOSIS — G8929 Other chronic pain: Secondary | ICD-10-CM | POA: Diagnosis not present

## 2020-09-15 DIAGNOSIS — C50919 Malignant neoplasm of unspecified site of unspecified female breast: Secondary | ICD-10-CM

## 2020-09-15 DIAGNOSIS — M545 Low back pain, unspecified: Secondary | ICD-10-CM

## 2020-09-15 MED ORDER — TRAMADOL HCL 50 MG PO TABS: 50.0000 mg | ORAL_TABLET | Freq: Three times a day (TID) | ORAL | 1 refills | Status: AC | PRN

## 2020-09-15 NOTE — Progress Notes (Signed)
Subjective:  Patient ID: Theresa Hampton, female    DOB: 11-Apr-1920  Age: 87 y.o. 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 acetaminophen (TYLENOL) 325 MG tablet Take 2 tablets (650 mg total) by mouth every 6 (six) hours as needed.    Marland Kitchen atorvastatin (LIPITOR) 40 MG tablet Take 1 tablet (40 mg total) by mouth daily at 6 PM. 30 tablet 0  . clopidogrel (PLAVIX) 75 MG tablet Take 1 tablet (75 mg total) by mouth daily. 30 tablet 0  . fluticasone (FLONASE) 50 MCG/ACT nasal spray Place into both nostrils daily.    . furosemide (LASIX) 20 MG tablet Take 1 tablet (20 mg total) by mouth daily. 30 tablet 5  . gabapentin (NEURONTIN) 100 MG capsule TAKE 1 CAPSULE BY MOUTH AT BEDTIME 30 capsule 1  . loratadine (CLARITIN) 10 MG tablet Take 1 tablet (10 mg total) by mouth daily. 100 tablet 2  . LUMIGAN 0.01 % SOLN instill 1 drop IN [facility] EYE AT BEDTIME 7.5 mL 0  . Menthol-Methyl Salicylate (MUSCLE RUB) 10-15 % CREA Apply 1 application topically 2 (two) times daily as needed for muscle pain (BACK PAIN).  0  . mirtazapine (REMERON) 15 MG tablet TAKE ONE TABLET BY MOUTH EVERY EVENING BEFORE DINNER AT 4 - 5 PM 30 tablet 1  . Multiple Vitamins-Iron (MULTIVITAMINS WITH IRON) TABS tablet Take 1 tablet by mouth daily.  0  . potassium chloride (KLOR-CON 10) 10 MEQ tablet Take 1 tablet (10 mEq total) by mouth daily. 30 tablet 5  . tolterodine (DETROL LA) 4 MG 24 hr capsule Take 1 capsule (4 mg total) by mouth daily. 30 capsule 11  . traMADol (ULTRAM) 50 MG tablet Take 1 tablet (50 mg total) by mouth every 12 (twelve) hours as needed for moderate pain or severe pain. 180 tablet 1  . traZODone (DESYREL) 50 MG tablet Take 0.5 tablets (25 mg total) by mouth at bedtime. 15 tablet 3  . vitamin B-12 (CYANOCOBALAMIN) 100 MCG tablet  Take 100 mcg by mouth daily.    . cholecalciferol (VITAMIN D) 400 units TABS tablet Take 400 Units by mouth.    . diclofenac sodium (VOLTAREN) 1 % GEL Apply 2 g topically 4 (four) times daily. 4 Tube 0  . senna-docusate (SENOKOT-S) 8.6-50 MG tablet Take 1 tablet by mouth at bedtime as needed for moderate constipation. 30 tablet 0   No facility-administered medications prior to visit.    ROS: Review of Systems  Objective:  BP 130/68 (BP Location: Right Arm)   Pulse (!) 104   Temp 98.4 F (36.9 C) (Oral)   LMP  (LMP Unknown)   SpO2 93%   BP Readings from Last 3 Encounters:  09/15/20 130/68  07/08/20 122/70  01/21/20 (!) 142/78    Wt Readings from Last 3 Encounters:  07/08/20 150 lb 12.8 oz (68.4 kg)  01/21/20 155 lb (70.3 kg)  07/21/19 155 lb (70.3 kg)    Physical Exam  Lab Results  Component Value Date   WBC 6.9 07/21/2019   HGB 11.7 (L) 07/21/2019   HCT 35.5 (L) 07/21/2019   PLT 260.0 07/21/2019   GLUCOSE 141 (H) 01/21/2020   CHOL 168 07/21/2019   TRIG 90.0 07/21/2019   HDL 60.70 07/21/2019   LDLDIRECT 125.4 05/10/2011  LDLCALC 90 07/21/2019   ALT 15 07/21/2019   AST 19 07/21/2019   NA 141 01/21/2020   K 3.9 01/21/2020   CL 104 01/21/2020   CREATININE 1.07 (H) 01/21/2020   BUN 15 01/21/2020   CO2 28 01/21/2020   TSH 3.28 01/21/2020   INR 1.01 05/06/2017   HGBA1C 5.3 05/07/2017    [place] LT BREAST BX W LOC DEV 1ST LESION IMG BX SPEC US GUIDE  Addendum Date: 09/08/2020   ADDENDUM REPORT: 09/08/2020 13:48 ADDENDUM: 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 This was found to be concordant by Dr. Kristopher Oppenheim. Pathology results were discussed with the patient's daughter Eduard Clos) by telephone. The patient reported doing well after the biopsy with tenderness at the site. Post biopsy instructions and care were reviewed with [facility] [place]. Questions were answered and order faxed from Dr. Jeanmarie Plant. The patient's daughter  and facility were encouraged to call [facility] for any additional concerns. The patient is scheduled for follow up appointment with Dr. Lew Dawes of [facility] on [address] on Sep 15, 2020. Per daughter's request, results will be discussed with the patient at that time. Pathology results reported by Stacie Acres RN on 09/08/2020. Electronically Signed   By: Kristopher Oppenheim M.D.   On: 09/08/2020 13:48   Result Date: 09/08/2020 CLINICAL DATA:  87 year old female with a large, solid and cystic necrotic left breast mass. EXAM: ULTRASOUND GUIDED LEFT BREAST CORE NEEDLE BIOPSY COMPARISON:  Previous exam(s). PROCEDURE: I met with the patient and we discussed the procedure of ultrasound-guided biopsy, including benefits and alternatives. We discussed the high likelihood of a successful procedure. We discussed the risks of the procedure, including infection, bleeding, tissue injury, clip migration, and inadequate sampling. Informed written consent was given. The usual time-out protocol was performed immediately prior to the procedure. Lesion quadrant: Lower outer quadrant Using sterile technique and 1% Lidocaine as local anesthetic, under direct ultrasound visualization, a 14 gauge spring-loaded device was used to perform biopsy of a large mixed solid and cystic mass in the lateral left breast using a inferior approach. After a single biopsy pass was taken, a large amount of thick, gelatinous red fluid began extravasating from the incision. This fluid was then aspirated and approximately 50 cc was obtained and sent for cytology. At the conclusion of the procedure a tribell tissue marker clip was deployed into the biopsy cavity. Follow up 2 view mammogram was deferred at this time. IMPRESSION: Ultrasound guided biopsy of the left breast. Additionally, proximally 50 cc of gelatinous, red fluid was aspirated and sent for cytology. No apparent complications. Electronically  Signed: By: Kristopher Oppenheim M.D. On: 09/06/2020 10:08    Assessment & Plan:   There are no diagnoses linked to this encounter.   No orders of the defined types were placed in this encounter.    Follow-up: No follow-ups on file.  Walker Kehr, MD

## 2020-09-15 NOTE — Assessment & Plan Note (Signed)
On Vit D 

## 2020-09-15 NOTE — Assessment & Plan Note (Signed)
Tramadol prn ° Potential benefits of a long term opioids use as well as potential risks (i.e. addiction risk, apnea etc) and complications (i.e. Somnolence, constipation and others) were explained to the patient and were aknowledged. ° ° °

## 2020-09-15 NOTE — Assessment & Plan Note (Addendum)
Oncology ref Tramadol prn pain up to tid Dx and plan discussed w/the pt and her daughter Lelon Frohlich

## 2020-09-16 ENCOUNTER — Telehealth: Payer: Self-pay | Admitting: Hematology

## 2020-09-16 DIAGNOSIS — Z20828 Contact with and (suspected) exposure to other viral communicable diseases: Secondary | ICD-10-CM | POA: Diagnosis not present

## 2020-09-16 DIAGNOSIS — Z1159 Encounter for screening for other viral diseases: Secondary | ICD-10-CM | POA: Diagnosis not present

## 2020-09-16 NOTE — Telephone Encounter (Signed)
Received a new hem referral from Dr. Alain Marion for new dx of Metaplastic carcinoma of breast. [name] has been scheduled to see Dr. Burr Medico on 5/19 at 1:40pm. Appt date and time has been given to the pt's daughter.

## 2020-09-20 DIAGNOSIS — Z20828 Contact with and (suspected) exposure to other viral communicable diseases: Secondary | ICD-10-CM | POA: Diagnosis not present

## 2020-09-20 DIAGNOSIS — Z1159 Encounter for screening for other viral diseases: Secondary | ICD-10-CM | POA: Diagnosis not present

## 2020-09-22 NOTE — Progress Notes (Signed)
[name]   [phone] [phone]   Clinic New Consult Note   Patient Care Team: Plotnikov, Theresa Lacks, MD as PCP - General Theresa Cantor, MD as Consulting Physician (Ophthalmology) Dr. Trenton Hampton (Dentistry)  Date of Service:  09/23/2020   CHIEF COMPLAINTS/PURPOSE OF CONSULTATION:  Metaplastic left breast cancer  REFERRING PHYSICIAN:  Dr. Alain Hampton  Oncology History Overview Note  Cancer Staging No matching staging information was found for the patient.    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 ACR Breast Density Category b: There are scattered areas of fibroglandular density. IMPRESSION: 1. Highly suspicious mass in the left breast at the palpable site of concern at 3 o'clock measuring at least 7.6 cm. 2.  No left axillary adenopathy. 3.  No mammographic evidence of malignancy in the right breast.   09/06/2020 Pathology Results   Breast, left, needle core biopsy, 3:00 7cmfn - METAPLASTIC CARCINOMA.  PROGNOSTIC INDICATORS: the tumor cells are NEGATIVE for Her2 (0). Estrogen Receptor: 0%, NEGATIVE Progesterone Receptor: 0%, NEGATIVE Proliferation Marker Ki67: 10-15%   09/15/2020 Initial Diagnosis   Metaplastic carcinoma of breast (HCC)      HISTORY OF PRESENTING ILLNESS:  Theresa Hampton 87 y.o. female is a here because of newly diagnosed metaplastic breast cancer. The patient was referred by Dr. Alain Hampton. The patient presents to the clinic today accompanied by her daughter Theresa Hampton.  She presented with a new lump in the left breast, first noticed in late 06/2020. She underwent bilateral diagnostic mammography and left breast ultrasonography on 08/23/20 showing: 7.6 cm mass in left breast at palpable site of concern at 3 o'clock; no left axillary adenopathy or mammographic  evidence of right breast malignancy.  Biopsy on 09/06/20 showed: metaplastic carcinoma. Prognostic indicators significant for: estrogen receptor, 0% negative and progesterone receptor, 0% negative. Proliferation marker Ki67 at 10-15%. HER2 negative (0). Her daughter notes this didn't go well at the time-- she was shaking during the procedure and had pain following.   Today the patient notes she is not feeling well today. Her daughter notes the patient had severe neck/shoulder pain yesterday that caused her difficulty lifting her head. The patient does not recall this, but her shoulders are raised and her head is drooped today. Her daughter also notes the patient's short-term memory has worsened over the last year. The patient is more disoriented, as well.  She has a PMHx of stroke in 2018  Socially, she lives at [facility], where she has been since 2019. She has needed more assistance in the last week, reports her daughter. She needs help getting to the bathroom and going to meals. This is new within the last week, and they are discussing whether she needs to move to memory care or another facility.   ROS obtained through patient's daughter, Theresa Hampton.  Aside from the above, all other systems were reviewed with the patient and are negative.   MEDICAL HISTORY:  Past Medical History:  Diagnosis Date  . Allergic rhinitis   . Arthritis    "hips" (05/08/2017)  . B12 deficiency anemia   . Chronic sinusitis   . Daily headache    "q am" (05/08/2017)  . History of transfusion 2004   "w/1st hip OR" (05/08/2017)  . Hyperlipidemia   . Hypertension   . Iron deficiency anemia   . Leg cramps   . Osteoarthritis  Dr. Maureen Hampton  . Osteoporosis   . Peptic ulcer disease 2003  . PONV (postoperative nausea and vomiting)   . Pyelonephritis, acute 2014  . Seasonal allergies   . Seizures (HCC)    x1 with Lidocaine  . Stroke ([place]) 05/05/2017  . Vitamin B12 deficiency   . Vitamin D deficiency     SURGICAL  HISTORY: Past Surgical History:  Procedure Laterality Date  . CATARACT EXTRACTION, BILATERAL Bilateral   . DILATION AND CURETTAGE OF UTERUS    . ENDOMETRIAL ABLATION  2008  . JOINT REPLACEMENT    . THYROIDECTOMY, PARTIAL  "many years ago"  . TOTAL HIP ARTHROPLASTY Left 01/06/2015   Procedure: LEFT TOTAL HIP ARTHROPLASTY ANTERIOR APPROACH;  Surgeon: Theresa Arabian, MD;  Location: [facility];  Service: Orthopedics;  Laterality: Left;  . TOTAL HIP ARTHROPLASTY Right 2004    SOCIAL HISTORY: Social History   Socioeconomic History  . Marital status: Widowed    Spouse name: Not on file  . Number of children: Not on file  . Years of education: Not on file  . Highest education level: Not on file  Occupational History  . Occupation: retired  Tobacco Use  . Smoking status: Never Smoker  . Smokeless tobacco: Never Used  Vaping Use  . Vaping Use: Never used  Substance and Sexual Activity  . Alcohol use: No  . Drug use: No  . Sexual activity: Not Currently  Other Topics Concern  . Not on file  Social History Narrative  . Not on file   Social Determinants of Health   Financial Resource Strain: Not on file  Food Insecurity: Not on file  Transportation Needs: Not on file  Physical Activity: Not on file  Stress: Not on file  Social Connections: Not on file  Intimate Partner Violence: Not on file    FAMILY HISTORY: Family History  Problem Relation Age of Onset  . Arthritis Mother   . Hypertension Other   . Cancer Sister     ALLERGIES:  is allergic to lidocaine hcl and amlodipine.  MEDICATIONS:  Current Outpatient Medications  Medication Sig Dispense Refill  . acetaminophen (TYLENOL) 325 MG tablet Take 2 tablets (650 mg total) by mouth every 6 (six) hours as needed.    Marland Kitchen atorvastatin (LIPITOR) 40 MG tablet Take 1 tablet (40 mg total) by mouth daily at 6 PM. 30 tablet 0  . Cholecalciferol (VITAMIN D3) 50 MCG (2000 UT) TABS Take 1 tablet by mouth daily.    . clopidogrel (PLAVIX)  75 MG tablet Take 1 tablet (75 mg total) by mouth daily. 30 tablet 0  . Dextromethorphan-guaiFENesin 30-200 MG/5ML LIQD Take 5 mLs by mouth 2 (two) times daily. For cough    . docusate sodium (COLACE) 100 MG capsule Take 100 mg by mouth at bedtime.    . fluticasone (FLONASE) 50 MCG/ACT nasal spray Place into both nostrils daily.    . furosemide (LASIX) 20 MG tablet Take 1 tablet (20 mg total) by mouth daily. 30 tablet 5  . gabapentin (NEURONTIN) 100 MG capsule TAKE 1 CAPSULE BY MOUTH AT BEDTIME 30 capsule 1  . loratadine (CLARITIN) 10 MG tablet Take 1 tablet (10 mg total) by mouth daily. 100 tablet 2  . LUMIGAN 0.01 % SOLN instill 1 drop IN [facility] AT BEDTIME 7.5 mL 0  . Menthol-Methyl Salicylate (MUSCLE RUB) 10-15 % CREA Apply 1 application topically 2 (two) times daily as needed for muscle pain (BACK PAIN).  0  . mirtazapine (REMERON) 15  MG tablet TAKE ONE TABLET BY MOUTH EVERY EVENING BEFORE DINNER AT 4 - 5 PM 30 tablet 1  . Multiple Vitamins-Iron (MULTIVITAMINS WITH IRON) TABS tablet Take 1 tablet by mouth daily.  0  . potassium chloride (KLOR-CON 10) 10 MEQ tablet Take 1 tablet (10 mEq total) by mouth daily. 30 tablet 5  . tolterodine (DETROL LA) 4 MG 24 hr capsule Take 1 capsule (4 mg total) by mouth daily. 30 capsule 11  . traMADol (ULTRAM) 50 MG tablet Take 1 tablet (50 mg total) by mouth 3 (three) times daily as needed for severe pain. 270 tablet 1  . traZODone (DESYREL) 50 MG tablet Take 0.5 tablets (25 mg total) by mouth at bedtime. 15 tablet 3  . vitamin B-12 (CYANOCOBALAMIN) 100 MCG tablet Take 100 mcg by mouth daily.     No current facility-administered medications for this visit.    PHYSICAL EXAMINATION: ECOG PERFORMANCE STATUS: 3 - Symptomatic, >50% confined to bed  Vitals:   09/23/20 1408  BP: 138/78  Pulse: (!) 110  Resp: 18  Temp: 97.7 F (36.5 C)  SpO2: 95%   Filed Weights    GENERAL:alert, no distress and comfortable SKIN: skin color, texture, turgor are  normal, no rashes or significant lesions EYES: normal, Conjunctiva are pink and non-injected, sclera clear Musculoskeletal:no cyanosis of digits and no clubbing  NEURO: alert & oriented x 3 with fluent speech, no focal motor/sensory deficits BREAST: there is a large lateral of left breast, measuring about 10 cm, with diffuse skin erythema, and a small open wound in the center, no other palpable breast mass or axillary adenopathy    LABORATORY DATA:  I have reviewed the data as listed CBC Latest Ref Rng & Units 07/21/2019 07/04/2018 10/26/2017  WBC 4.0 - 10.5 K/uL 6.9 7.5 8.9  Hemoglobin 12.0 - 15.0 g/dL 11.7(L) 12.0 12.0  Hematocrit 36.0 - 46.0 % 35.5(L) 34.7(L) 35.6(L)  Platelets 150.0 - 400.0 K/uL 260.0 242.0 265.0     CMP Latest Ref Rng & Units 01/21/2020 07/21/2019 07/04/2018  Glucose 65 - 99 mg/dL 141(H) 104(H) 96  BUN 7 - 25 mg/dL '15 12 15  ' Creatinine 0.60 - 0.88 mg/dL 1.07(H) 0.95 1.10  Sodium 135 - 146 mmol/L 141 140 139  Potassium 3.5 - 5.3 mmol/L 3.9 4.0 4.3  Chloride 98 - 110 mmol/L 104 103 102  CO2 20 - 32 mmol/L '28 28 29  ' Calcium 8.6 - 10.4 mg/dL 8.9 9.3 9.5  Total Protein 6.0 - 8.3 g/dL - 7.3 7.2  Total Bilirubin 0.2 - 1.2 mg/dL - 0.4 0.5  Alkaline Phos 39 - 117 U/L - 109 88  AST 0 - 37 U/L - 19 18  ALT 0 - 35 U/L - 15 14      RADIOGRAPHIC STUDIES: I have personally reviewed the radiological images as listed and agreed with the findings in the report. No results found.   ASSESSMENT & PLAN:  QUANESHA KLIMASZEWSKI is a 87 y.o. female with   1. Metaplastic Carcinoma of Left Breast, [id number], at least stage IIIC, ER-/PR-/HER2-, Grade 3  -She presented with a palpable left breast mass. Biopsy 09/06/20 revealed metaplastic carcinoma, triple negative  -She has a large left breast mass on exam, with diffuse skin erythema and skin ulceration in the center, inflammatory breast cancer.  This is at least stage III disease.  -I reviewed the pathology and mammogram results with the  patient and her daughter in detail. The patient was anxious to leave today.  -  Due to her advanced age, and medical comorbidities, patient declined stating CT scan, which is reasonable. -I discussed with her that metaplastic carcinoma is a rare form of breast.  Triple negative, and aggressive.  Unfortunately it does not respond to chemotherapy well. -We discussed potential treatment options, including mastectomy, or palliative radiation.  Due to her age and medical comorbidities, she is not a candidate for mastectomy.  I will certainly not offer her chemotherapy.  I recommended her to consider palliative radiation, to avoid worsening wound issue and pain of left breast.  Patient's daughter does not think she can lay still on the radiation table, and he declined.   -I recommend hospice home care for wound care and symptom management.  I discussed the logistics of hospice, patient's daughter is interested, will refer her today. -I will be happy to be her medical attending when she is under hospice care.  2. Goal of Care, DNR  -She insisted today that she did not want to receive any treatment.  -Her two daughters are her HCPOAs, and is very agreement that her goal of care is comfort -She would like to contact her son-in-law for his opinion before making a decision. -Her daughter Theresa Hampton agrees with DNR today   3. Dementia and deconditioning  -we discussed that if she is going to need more personal care at home, especially with her dementia    Plan: -I will make a referral to [facility] for home hospice care today. -I will be her medical attending if she enrolls hospice   No problem-specific Assessment & Plan notes found for this encounter.   Orders Placed This Encounter  Procedures  . Ambulatory referral to Hospice    Referral Priority:   Urgent    Referral Type:   Consultation    Referral Reason:   Symptom Managment    Requested Specialty:   Hospice Services    Number of Visits Requested:    1   All questions were answered. The patient knows to call the clinic with any problems, questions or concerns. No barriers to learning was detected. The total time spent in the appointment was 45 minutes.     Truitt Merle, MD 09/23/2020   I, Wilburn Mylar, am acting as scribe for Truitt Merle, MD.   I have reviewed the above documentation for accuracy and completeness, and I agree with the above.

## 2020-09-23 ENCOUNTER — Other Ambulatory Visit: Payer: Self-pay

## 2020-09-23 ENCOUNTER — Inpatient Hospital Stay: Payer: Medicare Other | Attending: Hematology | Admitting: Hematology

## 2020-09-23 ENCOUNTER — Encounter: Payer: Self-pay | Admitting: Hematology

## 2020-09-23 VITALS — BP 138/78 | HR 110 | Temp 97.7°F | Resp 18 | Ht 65.0 in

## 2020-09-23 DIAGNOSIS — Z1159 Encounter for screening for other viral diseases: Secondary | ICD-10-CM | POA: Diagnosis not present

## 2020-09-23 DIAGNOSIS — Z171 Estrogen receptor negative status [ER-]: Secondary | ICD-10-CM | POA: Insufficient documentation

## 2020-09-23 DIAGNOSIS — F0390 Unspecified dementia without behavioral disturbance: Secondary | ICD-10-CM | POA: Insufficient documentation

## 2020-09-23 DIAGNOSIS — Z20828 Contact with and (suspected) exposure to other viral communicable diseases: Secondary | ICD-10-CM | POA: Diagnosis not present

## 2020-09-23 DIAGNOSIS — C50919 Malignant neoplasm of unspecified site of unspecified female breast: Secondary | ICD-10-CM

## 2020-09-23 DIAGNOSIS — Z809 Family history of malignant neoplasm, unspecified: Secondary | ICD-10-CM | POA: Insufficient documentation

## 2020-09-23 DIAGNOSIS — C50812 Malignant neoplasm of overlapping sites of left female breast: Secondary | ICD-10-CM | POA: Diagnosis not present

## 2020-09-26 DIAGNOSIS — F419 Anxiety disorder, unspecified: Secondary | ICD-10-CM | POA: Diagnosis not present

## 2020-09-26 DIAGNOSIS — E559 Vitamin D deficiency, unspecified: Secondary | ICD-10-CM | POA: Diagnosis not present

## 2020-09-26 DIAGNOSIS — E785 Hyperlipidemia, unspecified: Secondary | ICD-10-CM | POA: Diagnosis not present

## 2020-09-26 DIAGNOSIS — S21002D Unspecified open wound of left breast, subsequent encounter: Secondary | ICD-10-CM | POA: Diagnosis not present

## 2020-09-26 DIAGNOSIS — J302 Other seasonal allergic rhinitis: Secondary | ICD-10-CM | POA: Diagnosis not present

## 2020-09-26 DIAGNOSIS — N393 Stress incontinence (female) (male): Secondary | ICD-10-CM | POA: Diagnosis not present

## 2020-09-26 DIAGNOSIS — C50912 Malignant neoplasm of unspecified site of left female breast: Secondary | ICD-10-CM | POA: Diagnosis not present

## 2020-09-26 DIAGNOSIS — E538 Deficiency of other specified B group vitamins: Secondary | ICD-10-CM | POA: Diagnosis not present

## 2020-09-26 DIAGNOSIS — N189 Chronic kidney disease, unspecified: Secondary | ICD-10-CM | POA: Diagnosis not present

## 2020-09-26 DIAGNOSIS — J329 Chronic sinusitis, unspecified: Secondary | ICD-10-CM | POA: Diagnosis not present

## 2020-09-26 DIAGNOSIS — G4700 Insomnia, unspecified: Secondary | ICD-10-CM | POA: Diagnosis not present

## 2020-09-26 DIAGNOSIS — I69318 Other symptoms and signs involving cognitive functions following cerebral infarction: Secondary | ICD-10-CM | POA: Diagnosis not present

## 2020-09-26 DIAGNOSIS — F32A Depression, unspecified: Secondary | ICD-10-CM | POA: Diagnosis not present

## 2020-09-26 DIAGNOSIS — H409 Unspecified glaucoma: Secondary | ICD-10-CM | POA: Diagnosis not present

## 2020-09-26 DIAGNOSIS — F0150 Vascular dementia without behavioral disturbance: Secondary | ICD-10-CM | POA: Diagnosis not present

## 2020-09-26 DIAGNOSIS — K219 Gastro-esophageal reflux disease without esophagitis: Secondary | ICD-10-CM | POA: Diagnosis not present

## 2020-09-26 DIAGNOSIS — I129 Hypertensive chronic kidney disease with stage 1 through stage 4 chronic kidney disease, or unspecified chronic kidney disease: Secondary | ICD-10-CM | POA: Diagnosis not present

## 2020-09-26 DIAGNOSIS — G629 Polyneuropathy, unspecified: Secondary | ICD-10-CM | POA: Diagnosis not present

## 2020-09-26 DIAGNOSIS — Z6825 Body mass index (BMI) 25.0-25.9, adult: Secondary | ICD-10-CM | POA: Diagnosis not present

## 2020-09-27 DIAGNOSIS — C50912 Malignant neoplasm of unspecified site of left female breast: Secondary | ICD-10-CM | POA: Diagnosis not present

## 2020-09-27 DIAGNOSIS — N189 Chronic kidney disease, unspecified: Secondary | ICD-10-CM | POA: Diagnosis not present

## 2020-09-27 DIAGNOSIS — E785 Hyperlipidemia, unspecified: Secondary | ICD-10-CM | POA: Diagnosis not present

## 2020-09-27 DIAGNOSIS — I129 Hypertensive chronic kidney disease with stage 1 through stage 4 chronic kidney disease, or unspecified chronic kidney disease: Secondary | ICD-10-CM | POA: Diagnosis not present

## 2020-09-27 DIAGNOSIS — F0150 Vascular dementia without behavioral disturbance: Secondary | ICD-10-CM | POA: Diagnosis not present

## 2020-09-27 DIAGNOSIS — Z20828 Contact with and (suspected) exposure to other viral communicable diseases: Secondary | ICD-10-CM | POA: Diagnosis not present

## 2020-09-27 DIAGNOSIS — Z1159 Encounter for screening for other viral diseases: Secondary | ICD-10-CM | POA: Diagnosis not present

## 2020-09-27 DIAGNOSIS — I69318 Other symptoms and signs involving cognitive functions following cerebral infarction: Secondary | ICD-10-CM | POA: Diagnosis not present

## 2020-09-28 DIAGNOSIS — I69318 Other symptoms and signs involving cognitive functions following cerebral infarction: Secondary | ICD-10-CM | POA: Diagnosis not present

## 2020-09-28 DIAGNOSIS — I129 Hypertensive chronic kidney disease with stage 1 through stage 4 chronic kidney disease, or unspecified chronic kidney disease: Secondary | ICD-10-CM | POA: Diagnosis not present

## 2020-09-28 DIAGNOSIS — E785 Hyperlipidemia, unspecified: Secondary | ICD-10-CM | POA: Diagnosis not present

## 2020-09-28 DIAGNOSIS — N189 Chronic kidney disease, unspecified: Secondary | ICD-10-CM | POA: Diagnosis not present

## 2020-09-28 DIAGNOSIS — F0150 Vascular dementia without behavioral disturbance: Secondary | ICD-10-CM | POA: Diagnosis not present

## 2020-09-28 DIAGNOSIS — C50912 Malignant neoplasm of unspecified site of left female breast: Secondary | ICD-10-CM | POA: Diagnosis not present

## 2020-09-29 DIAGNOSIS — N189 Chronic kidney disease, unspecified: Secondary | ICD-10-CM | POA: Diagnosis not present

## 2020-09-29 DIAGNOSIS — I69318 Other symptoms and signs involving cognitive functions following cerebral infarction: Secondary | ICD-10-CM | POA: Diagnosis not present

## 2020-09-29 DIAGNOSIS — F0150 Vascular dementia without behavioral disturbance: Secondary | ICD-10-CM | POA: Diagnosis not present

## 2020-09-29 DIAGNOSIS — I129 Hypertensive chronic kidney disease with stage 1 through stage 4 chronic kidney disease, or unspecified chronic kidney disease: Secondary | ICD-10-CM | POA: Diagnosis not present

## 2020-09-29 DIAGNOSIS — C50912 Malignant neoplasm of unspecified site of left female breast: Secondary | ICD-10-CM | POA: Diagnosis not present

## 2020-09-29 DIAGNOSIS — E785 Hyperlipidemia, unspecified: Secondary | ICD-10-CM | POA: Diagnosis not present

## 2020-09-30 DIAGNOSIS — Z20828 Contact with and (suspected) exposure to other viral communicable diseases: Secondary | ICD-10-CM | POA: Diagnosis not present

## 2020-09-30 DIAGNOSIS — Z1159 Encounter for screening for other viral diseases: Secondary | ICD-10-CM | POA: Diagnosis not present

## 2020-10-01 ENCOUNTER — Telehealth: Payer: Self-pay | Admitting: Internal Medicine

## 2020-10-01 DIAGNOSIS — I129 Hypertensive chronic kidney disease with stage 1 through stage 4 chronic kidney disease, or unspecified chronic kidney disease: Secondary | ICD-10-CM | POA: Diagnosis not present

## 2020-10-01 DIAGNOSIS — F0150 Vascular dementia without behavioral disturbance: Secondary | ICD-10-CM | POA: Diagnosis not present

## 2020-10-01 DIAGNOSIS — E785 Hyperlipidemia, unspecified: Secondary | ICD-10-CM | POA: Diagnosis not present

## 2020-10-01 DIAGNOSIS — N189 Chronic kidney disease, unspecified: Secondary | ICD-10-CM | POA: Diagnosis not present

## 2020-10-01 DIAGNOSIS — C50912 Malignant neoplasm of unspecified site of left female breast: Secondary | ICD-10-CM | POA: Diagnosis not present

## 2020-10-01 DIAGNOSIS — I69318 Other symptoms and signs involving cognitive functions following cerebral infarction: Secondary | ICD-10-CM | POA: Diagnosis not present

## 2020-10-01 NOTE — Telephone Encounter (Signed)
Patients daughter called and was wondering if the patient needs to schedule a 3 month follow up with PCP. She said that she is on hospice care and was wondering if Dr. Alain Marion was still over her medical needs or if hospice was over it. She can be reached at [phone]. Please advise

## 2020-10-02 DIAGNOSIS — E785 Hyperlipidemia, unspecified: Secondary | ICD-10-CM | POA: Diagnosis not present

## 2020-10-02 DIAGNOSIS — C50912 Malignant neoplasm of unspecified site of left female breast: Secondary | ICD-10-CM | POA: Diagnosis not present

## 2020-10-02 DIAGNOSIS — N189 Chronic kidney disease, unspecified: Secondary | ICD-10-CM | POA: Diagnosis not present

## 2020-10-02 DIAGNOSIS — I129 Hypertensive chronic kidney disease with stage 1 through stage 4 chronic kidney disease, or unspecified chronic kidney disease: Secondary | ICD-10-CM | POA: Diagnosis not present

## 2020-10-02 DIAGNOSIS — I69318 Other symptoms and signs involving cognitive functions following cerebral infarction: Secondary | ICD-10-CM | POA: Diagnosis not present

## 2020-10-02 DIAGNOSIS — F0150 Vascular dementia without behavioral disturbance: Secondary | ICD-10-CM | POA: Diagnosis not present

## 2020-10-02 NOTE — Telephone Encounter (Signed)
Hospice so will be taking care of Theresa Hampton at home.  We will be in touch with me.  I will be happy to see her in the office if needed.  Thank you

## 2020-10-04 DIAGNOSIS — Z20828 Contact with and (suspected) exposure to other viral communicable diseases: Secondary | ICD-10-CM | POA: Diagnosis not present

## 2020-10-04 DIAGNOSIS — Z1159 Encounter for screening for other viral diseases: Secondary | ICD-10-CM | POA: Diagnosis not present

## 2020-10-05 NOTE — Telephone Encounter (Signed)
Called pt daughter there was no answer LMOM RTC.Marland KitchenJohny Hampton

## 2020-10-06 DIAGNOSIS — N393 Stress incontinence (female) (male): Secondary | ICD-10-CM | POA: Diagnosis not present

## 2020-10-06 DIAGNOSIS — F32A Depression, unspecified: Secondary | ICD-10-CM | POA: Diagnosis not present

## 2020-10-06 DIAGNOSIS — E785 Hyperlipidemia, unspecified: Secondary | ICD-10-CM | POA: Diagnosis not present

## 2020-10-06 DIAGNOSIS — E559 Vitamin D deficiency, unspecified: Secondary | ICD-10-CM | POA: Diagnosis not present

## 2020-10-06 DIAGNOSIS — S21002D Unspecified open wound of left breast, subsequent encounter: Secondary | ICD-10-CM | POA: Diagnosis not present

## 2020-10-06 DIAGNOSIS — I129 Hypertensive chronic kidney disease with stage 1 through stage 4 chronic kidney disease, or unspecified chronic kidney disease: Secondary | ICD-10-CM | POA: Diagnosis not present

## 2020-10-06 DIAGNOSIS — G4700 Insomnia, unspecified: Secondary | ICD-10-CM | POA: Diagnosis not present

## 2020-10-06 DIAGNOSIS — C50912 Malignant neoplasm of unspecified site of left female breast: Secondary | ICD-10-CM | POA: Diagnosis not present

## 2020-10-06 DIAGNOSIS — Z6825 Body mass index (BMI) 25.0-25.9, adult: Secondary | ICD-10-CM | POA: Diagnosis not present

## 2020-10-06 DIAGNOSIS — H409 Unspecified glaucoma: Secondary | ICD-10-CM | POA: Diagnosis not present

## 2020-10-06 DIAGNOSIS — J329 Chronic sinusitis, unspecified: Secondary | ICD-10-CM | POA: Diagnosis not present

## 2020-10-06 DIAGNOSIS — F419 Anxiety disorder, unspecified: Secondary | ICD-10-CM | POA: Diagnosis not present

## 2020-10-06 DIAGNOSIS — N189 Chronic kidney disease, unspecified: Secondary | ICD-10-CM | POA: Diagnosis not present

## 2020-10-06 DIAGNOSIS — E538 Deficiency of other specified B group vitamins: Secondary | ICD-10-CM | POA: Diagnosis not present

## 2020-10-06 DIAGNOSIS — J302 Other seasonal allergic rhinitis: Secondary | ICD-10-CM | POA: Diagnosis not present

## 2020-10-06 DIAGNOSIS — G629 Polyneuropathy, unspecified: Secondary | ICD-10-CM | POA: Diagnosis not present

## 2020-10-06 DIAGNOSIS — I69318 Other symptoms and signs involving cognitive functions following cerebral infarction: Secondary | ICD-10-CM | POA: Diagnosis not present

## 2020-10-06 DIAGNOSIS — K219 Gastro-esophageal reflux disease without esophagitis: Secondary | ICD-10-CM | POA: Diagnosis not present

## 2020-10-06 DIAGNOSIS — F0150 Vascular dementia without behavioral disturbance: Secondary | ICD-10-CM | POA: Diagnosis not present

## 2020-10-06 NOTE — Telephone Encounter (Signed)
Called daughter again still no answer LMOM w/MD response.Marland KitchenJohny Chess

## 2020-10-07 DIAGNOSIS — Z20828 Contact with and (suspected) exposure to other viral communicable diseases: Secondary | ICD-10-CM | POA: Diagnosis not present

## 2020-10-07 DIAGNOSIS — E785 Hyperlipidemia, unspecified: Secondary | ICD-10-CM | POA: Diagnosis not present

## 2020-10-07 DIAGNOSIS — F0150 Vascular dementia without behavioral disturbance: Secondary | ICD-10-CM | POA: Diagnosis not present

## 2020-10-07 DIAGNOSIS — I69318 Other symptoms and signs involving cognitive functions following cerebral infarction: Secondary | ICD-10-CM | POA: Diagnosis not present

## 2020-10-07 DIAGNOSIS — Z1159 Encounter for screening for other viral diseases: Secondary | ICD-10-CM | POA: Diagnosis not present

## 2020-10-07 DIAGNOSIS — I129 Hypertensive chronic kidney disease with stage 1 through stage 4 chronic kidney disease, or unspecified chronic kidney disease: Secondary | ICD-10-CM | POA: Diagnosis not present

## 2020-10-07 DIAGNOSIS — N189 Chronic kidney disease, unspecified: Secondary | ICD-10-CM | POA: Diagnosis not present

## 2020-10-07 DIAGNOSIS — C50912 Malignant neoplasm of unspecified site of left female breast: Secondary | ICD-10-CM | POA: Diagnosis not present

## 2020-10-11 DIAGNOSIS — Z1159 Encounter for screening for other viral diseases: Secondary | ICD-10-CM | POA: Diagnosis not present

## 2020-10-11 DIAGNOSIS — Z20828 Contact with and (suspected) exposure to other viral communicable diseases: Secondary | ICD-10-CM | POA: Diagnosis not present

## 2020-10-14 DIAGNOSIS — F0150 Vascular dementia without behavioral disturbance: Secondary | ICD-10-CM | POA: Diagnosis not present

## 2020-10-14 DIAGNOSIS — I69318 Other symptoms and signs involving cognitive functions following cerebral infarction: Secondary | ICD-10-CM | POA: Diagnosis not present

## 2020-10-14 DIAGNOSIS — E785 Hyperlipidemia, unspecified: Secondary | ICD-10-CM | POA: Diagnosis not present

## 2020-10-14 DIAGNOSIS — C50912 Malignant neoplasm of unspecified site of left female breast: Secondary | ICD-10-CM | POA: Diagnosis not present

## 2020-10-14 DIAGNOSIS — I129 Hypertensive chronic kidney disease with stage 1 through stage 4 chronic kidney disease, or unspecified chronic kidney disease: Secondary | ICD-10-CM | POA: Diagnosis not present

## 2020-10-14 DIAGNOSIS — Z1159 Encounter for screening for other viral diseases: Secondary | ICD-10-CM | POA: Diagnosis not present

## 2020-10-14 DIAGNOSIS — N189 Chronic kidney disease, unspecified: Secondary | ICD-10-CM | POA: Diagnosis not present

## 2020-10-14 DIAGNOSIS — Z20828 Contact with and (suspected) exposure to other viral communicable diseases: Secondary | ICD-10-CM | POA: Diagnosis not present

## 2020-10-15 DIAGNOSIS — C50912 Malignant neoplasm of unspecified site of left female breast: Secondary | ICD-10-CM | POA: Diagnosis not present

## 2020-10-15 DIAGNOSIS — E785 Hyperlipidemia, unspecified: Secondary | ICD-10-CM | POA: Diagnosis not present

## 2020-10-15 DIAGNOSIS — F0150 Vascular dementia without behavioral disturbance: Secondary | ICD-10-CM | POA: Diagnosis not present

## 2020-10-15 DIAGNOSIS — I69318 Other symptoms and signs involving cognitive functions following cerebral infarction: Secondary | ICD-10-CM | POA: Diagnosis not present

## 2020-10-15 DIAGNOSIS — I129 Hypertensive chronic kidney disease with stage 1 through stage 4 chronic kidney disease, or unspecified chronic kidney disease: Secondary | ICD-10-CM | POA: Diagnosis not present

## 2020-10-15 DIAGNOSIS — N189 Chronic kidney disease, unspecified: Secondary | ICD-10-CM | POA: Diagnosis not present

## 2020-10-18 ENCOUNTER — Telehealth: Payer: Self-pay | Admitting: *Deleted

## 2020-10-18 MED ORDER — LORAZEPAM 0.5 MG PO TABS: 0.5000 mg | ORAL_TABLET | Freq: Two times a day (BID) | ORAL | 3 refills | Status: AC | PRN

## 2020-10-18 NOTE — Telephone Encounter (Signed)
Ok Thx 

## 2020-10-18 NOTE — Telephone Encounter (Signed)
Rec'd fax refill request for Lorazepam 0.5mg  from [facility]. Last filled 10/02/20. Pls advise on refill,,/lmb

## 2020-10-21 DIAGNOSIS — Z1159 Encounter for screening for other viral diseases: Secondary | ICD-10-CM | POA: Diagnosis not present

## 2020-10-21 DIAGNOSIS — Z20828 Contact with and (suspected) exposure to other viral communicable diseases: Secondary | ICD-10-CM | POA: Diagnosis not present

## 2020-10-21 DIAGNOSIS — I129 Hypertensive chronic kidney disease with stage 1 through stage 4 chronic kidney disease, or unspecified chronic kidney disease: Secondary | ICD-10-CM | POA: Diagnosis not present

## 2020-10-21 DIAGNOSIS — F0150 Vascular dementia without behavioral disturbance: Secondary | ICD-10-CM | POA: Diagnosis not present

## 2020-10-21 DIAGNOSIS — N189 Chronic kidney disease, unspecified: Secondary | ICD-10-CM | POA: Diagnosis not present

## 2020-10-21 DIAGNOSIS — E785 Hyperlipidemia, unspecified: Secondary | ICD-10-CM | POA: Diagnosis not present

## 2020-10-21 DIAGNOSIS — I69318 Other symptoms and signs involving cognitive functions following cerebral infarction: Secondary | ICD-10-CM | POA: Diagnosis not present

## 2020-10-21 DIAGNOSIS — C50912 Malignant neoplasm of unspecified site of left female breast: Secondary | ICD-10-CM | POA: Diagnosis not present

## 2020-10-22 DIAGNOSIS — C50912 Malignant neoplasm of unspecified site of left female breast: Secondary | ICD-10-CM | POA: Diagnosis not present

## 2020-10-22 DIAGNOSIS — E785 Hyperlipidemia, unspecified: Secondary | ICD-10-CM | POA: Diagnosis not present

## 2020-10-22 DIAGNOSIS — I69318 Other symptoms and signs involving cognitive functions following cerebral infarction: Secondary | ICD-10-CM | POA: Diagnosis not present

## 2020-10-22 DIAGNOSIS — N189 Chronic kidney disease, unspecified: Secondary | ICD-10-CM | POA: Diagnosis not present

## 2020-10-22 DIAGNOSIS — I129 Hypertensive chronic kidney disease with stage 1 through stage 4 chronic kidney disease, or unspecified chronic kidney disease: Secondary | ICD-10-CM | POA: Diagnosis not present

## 2020-10-22 DIAGNOSIS — F0150 Vascular dementia without behavioral disturbance: Secondary | ICD-10-CM | POA: Diagnosis not present

## 2020-10-25 DIAGNOSIS — I69318 Other symptoms and signs involving cognitive functions following cerebral infarction: Secondary | ICD-10-CM | POA: Diagnosis not present

## 2020-10-25 DIAGNOSIS — Z20828 Contact with and (suspected) exposure to other viral communicable diseases: Secondary | ICD-10-CM | POA: Diagnosis not present

## 2020-10-25 DIAGNOSIS — Z1159 Encounter for screening for other viral diseases: Secondary | ICD-10-CM | POA: Diagnosis not present

## 2020-10-25 DIAGNOSIS — N189 Chronic kidney disease, unspecified: Secondary | ICD-10-CM | POA: Diagnosis not present

## 2020-10-25 DIAGNOSIS — I129 Hypertensive chronic kidney disease with stage 1 through stage 4 chronic kidney disease, or unspecified chronic kidney disease: Secondary | ICD-10-CM | POA: Diagnosis not present

## 2020-10-25 DIAGNOSIS — F0150 Vascular dementia without behavioral disturbance: Secondary | ICD-10-CM | POA: Diagnosis not present

## 2020-10-25 DIAGNOSIS — E785 Hyperlipidemia, unspecified: Secondary | ICD-10-CM | POA: Diagnosis not present

## 2020-10-25 DIAGNOSIS — C50912 Malignant neoplasm of unspecified site of left female breast: Secondary | ICD-10-CM | POA: Diagnosis not present

## 2020-10-26 DIAGNOSIS — E785 Hyperlipidemia, unspecified: Secondary | ICD-10-CM | POA: Diagnosis not present

## 2020-10-26 DIAGNOSIS — F0150 Vascular dementia without behavioral disturbance: Secondary | ICD-10-CM | POA: Diagnosis not present

## 2020-10-26 DIAGNOSIS — I69318 Other symptoms and signs involving cognitive functions following cerebral infarction: Secondary | ICD-10-CM | POA: Diagnosis not present

## 2020-10-26 DIAGNOSIS — I129 Hypertensive chronic kidney disease with stage 1 through stage 4 chronic kidney disease, or unspecified chronic kidney disease: Secondary | ICD-10-CM | POA: Diagnosis not present

## 2020-10-26 DIAGNOSIS — C50912 Malignant neoplasm of unspecified site of left female breast: Secondary | ICD-10-CM | POA: Diagnosis not present

## 2020-10-26 DIAGNOSIS — N189 Chronic kidney disease, unspecified: Secondary | ICD-10-CM | POA: Diagnosis not present

## 2020-10-28 DIAGNOSIS — Z20828 Contact with and (suspected) exposure to other viral communicable diseases: Secondary | ICD-10-CM | POA: Diagnosis not present

## 2020-10-28 DIAGNOSIS — Z1159 Encounter for screening for other viral diseases: Secondary | ICD-10-CM | POA: Diagnosis not present

## 2020-11-01 DIAGNOSIS — Z20828 Contact with and (suspected) exposure to other viral communicable diseases: Secondary | ICD-10-CM | POA: Diagnosis not present

## 2020-11-01 DIAGNOSIS — Z1159 Encounter for screening for other viral diseases: Secondary | ICD-10-CM | POA: Diagnosis not present

## 2020-11-03 DIAGNOSIS — C50912 Malignant neoplasm of unspecified site of left female breast: Secondary | ICD-10-CM | POA: Diagnosis not present

## 2020-11-03 DIAGNOSIS — I129 Hypertensive chronic kidney disease with stage 1 through stage 4 chronic kidney disease, or unspecified chronic kidney disease: Secondary | ICD-10-CM | POA: Diagnosis not present

## 2020-11-03 DIAGNOSIS — E785 Hyperlipidemia, unspecified: Secondary | ICD-10-CM | POA: Diagnosis not present

## 2020-11-03 DIAGNOSIS — N189 Chronic kidney disease, unspecified: Secondary | ICD-10-CM | POA: Diagnosis not present

## 2020-11-03 DIAGNOSIS — F0150 Vascular dementia without behavioral disturbance: Secondary | ICD-10-CM | POA: Diagnosis not present

## 2020-11-03 DIAGNOSIS — I69318 Other symptoms and signs involving cognitive functions following cerebral infarction: Secondary | ICD-10-CM | POA: Diagnosis not present

## 2020-11-04 DIAGNOSIS — Z1159 Encounter for screening for other viral diseases: Secondary | ICD-10-CM | POA: Diagnosis not present

## 2020-11-04 DIAGNOSIS — Z20828 Contact with and (suspected) exposure to other viral communicable diseases: Secondary | ICD-10-CM | POA: Diagnosis not present

## 2020-11-05 DIAGNOSIS — E785 Hyperlipidemia, unspecified: Secondary | ICD-10-CM | POA: Diagnosis not present

## 2020-11-05 DIAGNOSIS — J329 Chronic sinusitis, unspecified: Secondary | ICD-10-CM | POA: Diagnosis not present

## 2020-11-05 DIAGNOSIS — I129 Hypertensive chronic kidney disease with stage 1 through stage 4 chronic kidney disease, or unspecified chronic kidney disease: Secondary | ICD-10-CM | POA: Diagnosis not present

## 2020-11-05 DIAGNOSIS — K219 Gastro-esophageal reflux disease without esophagitis: Secondary | ICD-10-CM | POA: Diagnosis not present

## 2020-11-05 DIAGNOSIS — G629 Polyneuropathy, unspecified: Secondary | ICD-10-CM | POA: Diagnosis not present

## 2020-11-05 DIAGNOSIS — G4700 Insomnia, unspecified: Secondary | ICD-10-CM | POA: Diagnosis not present

## 2020-11-05 DIAGNOSIS — N189 Chronic kidney disease, unspecified: Secondary | ICD-10-CM | POA: Diagnosis not present

## 2020-11-05 DIAGNOSIS — E559 Vitamin D deficiency, unspecified: Secondary | ICD-10-CM | POA: Diagnosis not present

## 2020-11-05 DIAGNOSIS — F0150 Vascular dementia without behavioral disturbance: Secondary | ICD-10-CM | POA: Diagnosis not present

## 2020-11-05 DIAGNOSIS — F32A Depression, unspecified: Secondary | ICD-10-CM | POA: Diagnosis not present

## 2020-11-05 DIAGNOSIS — E538 Deficiency of other specified B group vitamins: Secondary | ICD-10-CM | POA: Diagnosis not present

## 2020-11-05 DIAGNOSIS — J302 Other seasonal allergic rhinitis: Secondary | ICD-10-CM | POA: Diagnosis not present

## 2020-11-05 DIAGNOSIS — F419 Anxiety disorder, unspecified: Secondary | ICD-10-CM | POA: Diagnosis not present

## 2020-11-05 DIAGNOSIS — N393 Stress incontinence (female) (male): Secondary | ICD-10-CM | POA: Diagnosis not present

## 2020-11-05 DIAGNOSIS — H409 Unspecified glaucoma: Secondary | ICD-10-CM | POA: Diagnosis not present

## 2020-11-05 DIAGNOSIS — C50912 Malignant neoplasm of unspecified site of left female breast: Secondary | ICD-10-CM | POA: Diagnosis not present

## 2020-11-05 DIAGNOSIS — S21002D Unspecified open wound of left breast, subsequent encounter: Secondary | ICD-10-CM | POA: Diagnosis not present

## 2020-11-05 DIAGNOSIS — I69318 Other symptoms and signs involving cognitive functions following cerebral infarction: Secondary | ICD-10-CM | POA: Diagnosis not present

## 2020-11-05 DIAGNOSIS — Z6825 Body mass index (BMI) 25.0-25.9, adult: Secondary | ICD-10-CM | POA: Diagnosis not present

## 2020-11-08 DIAGNOSIS — Z20828 Contact with and (suspected) exposure to other viral communicable diseases: Secondary | ICD-10-CM | POA: Diagnosis not present

## 2020-11-08 DIAGNOSIS — Z1159 Encounter for screening for other viral diseases: Secondary | ICD-10-CM | POA: Diagnosis not present

## 2020-11-09 DIAGNOSIS — I129 Hypertensive chronic kidney disease with stage 1 through stage 4 chronic kidney disease, or unspecified chronic kidney disease: Secondary | ICD-10-CM | POA: Diagnosis not present

## 2020-11-09 DIAGNOSIS — I69318 Other symptoms and signs involving cognitive functions following cerebral infarction: Secondary | ICD-10-CM | POA: Diagnosis not present

## 2020-11-09 DIAGNOSIS — C50912 Malignant neoplasm of unspecified site of left female breast: Secondary | ICD-10-CM | POA: Diagnosis not present

## 2020-11-09 DIAGNOSIS — N189 Chronic kidney disease, unspecified: Secondary | ICD-10-CM | POA: Diagnosis not present

## 2020-11-09 DIAGNOSIS — E785 Hyperlipidemia, unspecified: Secondary | ICD-10-CM | POA: Diagnosis not present

## 2020-11-09 DIAGNOSIS — F0150 Vascular dementia without behavioral disturbance: Secondary | ICD-10-CM | POA: Diagnosis not present

## 2020-11-10 DIAGNOSIS — N189 Chronic kidney disease, unspecified: Secondary | ICD-10-CM | POA: Diagnosis not present

## 2020-11-10 DIAGNOSIS — I129 Hypertensive chronic kidney disease with stage 1 through stage 4 chronic kidney disease, or unspecified chronic kidney disease: Secondary | ICD-10-CM | POA: Diagnosis not present

## 2020-11-10 DIAGNOSIS — C50912 Malignant neoplasm of unspecified site of left female breast: Secondary | ICD-10-CM | POA: Diagnosis not present

## 2020-11-10 DIAGNOSIS — E785 Hyperlipidemia, unspecified: Secondary | ICD-10-CM | POA: Diagnosis not present

## 2020-11-10 DIAGNOSIS — F0150 Vascular dementia without behavioral disturbance: Secondary | ICD-10-CM | POA: Diagnosis not present

## 2020-11-10 DIAGNOSIS — I69318 Other symptoms and signs involving cognitive functions following cerebral infarction: Secondary | ICD-10-CM | POA: Diagnosis not present

## 2020-11-11 DIAGNOSIS — Z1159 Encounter for screening for other viral diseases: Secondary | ICD-10-CM | POA: Diagnosis not present

## 2020-11-11 DIAGNOSIS — Z20828 Contact with and (suspected) exposure to other viral communicable diseases: Secondary | ICD-10-CM | POA: Diagnosis not present

## 2020-11-15 DIAGNOSIS — Z1159 Encounter for screening for other viral diseases: Secondary | ICD-10-CM | POA: Diagnosis not present

## 2020-11-15 DIAGNOSIS — E785 Hyperlipidemia, unspecified: Secondary | ICD-10-CM | POA: Diagnosis not present

## 2020-11-15 DIAGNOSIS — I129 Hypertensive chronic kidney disease with stage 1 through stage 4 chronic kidney disease, or unspecified chronic kidney disease: Secondary | ICD-10-CM | POA: Diagnosis not present

## 2020-11-15 DIAGNOSIS — C50912 Malignant neoplasm of unspecified site of left female breast: Secondary | ICD-10-CM | POA: Diagnosis not present

## 2020-11-15 DIAGNOSIS — F0150 Vascular dementia without behavioral disturbance: Secondary | ICD-10-CM | POA: Diagnosis not present

## 2020-11-15 DIAGNOSIS — Z20828 Contact with and (suspected) exposure to other viral communicable diseases: Secondary | ICD-10-CM | POA: Diagnosis not present

## 2020-11-15 DIAGNOSIS — I69318 Other symptoms and signs involving cognitive functions following cerebral infarction: Secondary | ICD-10-CM | POA: Diagnosis not present

## 2020-11-15 DIAGNOSIS — N189 Chronic kidney disease, unspecified: Secondary | ICD-10-CM | POA: Diagnosis not present

## 2020-11-18 DIAGNOSIS — Z20828 Contact with and (suspected) exposure to other viral communicable diseases: Secondary | ICD-10-CM | POA: Diagnosis not present

## 2020-11-18 DIAGNOSIS — Z1159 Encounter for screening for other viral diseases: Secondary | ICD-10-CM | POA: Diagnosis not present

## 2020-11-22 DIAGNOSIS — I129 Hypertensive chronic kidney disease with stage 1 through stage 4 chronic kidney disease, or unspecified chronic kidney disease: Secondary | ICD-10-CM | POA: Diagnosis not present

## 2020-11-22 DIAGNOSIS — E785 Hyperlipidemia, unspecified: Secondary | ICD-10-CM | POA: Diagnosis not present

## 2020-11-22 DIAGNOSIS — Z1159 Encounter for screening for other viral diseases: Secondary | ICD-10-CM | POA: Diagnosis not present

## 2020-11-22 DIAGNOSIS — I69318 Other symptoms and signs involving cognitive functions following cerebral infarction: Secondary | ICD-10-CM | POA: Diagnosis not present

## 2020-11-22 DIAGNOSIS — F0150 Vascular dementia without behavioral disturbance: Secondary | ICD-10-CM | POA: Diagnosis not present

## 2020-11-22 DIAGNOSIS — Z20828 Contact with and (suspected) exposure to other viral communicable diseases: Secondary | ICD-10-CM | POA: Diagnosis not present

## 2020-11-22 DIAGNOSIS — C50912 Malignant neoplasm of unspecified site of left female breast: Secondary | ICD-10-CM | POA: Diagnosis not present

## 2020-11-22 DIAGNOSIS — N189 Chronic kidney disease, unspecified: Secondary | ICD-10-CM | POA: Diagnosis not present

## 2020-11-23 DIAGNOSIS — I129 Hypertensive chronic kidney disease with stage 1 through stage 4 chronic kidney disease, or unspecified chronic kidney disease: Secondary | ICD-10-CM | POA: Diagnosis not present

## 2020-11-23 DIAGNOSIS — E785 Hyperlipidemia, unspecified: Secondary | ICD-10-CM | POA: Diagnosis not present

## 2020-11-23 DIAGNOSIS — I69318 Other symptoms and signs involving cognitive functions following cerebral infarction: Secondary | ICD-10-CM | POA: Diagnosis not present

## 2020-11-23 DIAGNOSIS — C50912 Malignant neoplasm of unspecified site of left female breast: Secondary | ICD-10-CM | POA: Diagnosis not present

## 2020-11-23 DIAGNOSIS — N189 Chronic kidney disease, unspecified: Secondary | ICD-10-CM | POA: Diagnosis not present

## 2020-11-23 DIAGNOSIS — F0150 Vascular dementia without behavioral disturbance: Secondary | ICD-10-CM | POA: Diagnosis not present

## 2020-11-25 DIAGNOSIS — Z20828 Contact with and (suspected) exposure to other viral communicable diseases: Secondary | ICD-10-CM | POA: Diagnosis not present

## 2020-11-25 DIAGNOSIS — Z1159 Encounter for screening for other viral diseases: Secondary | ICD-10-CM | POA: Diagnosis not present

## 2020-11-29 DIAGNOSIS — Z1159 Encounter for screening for other viral diseases: Secondary | ICD-10-CM | POA: Diagnosis not present

## 2020-11-29 DIAGNOSIS — Z20828 Contact with and (suspected) exposure to other viral communicable diseases: Secondary | ICD-10-CM | POA: Diagnosis not present

## 2020-11-30 ENCOUNTER — Telehealth: Payer: Self-pay | Admitting: *Deleted

## 2020-11-30 DIAGNOSIS — F0150 Vascular dementia without behavioral disturbance: Secondary | ICD-10-CM | POA: Diagnosis not present

## 2020-11-30 DIAGNOSIS — I69318 Other symptoms and signs involving cognitive functions following cerebral infarction: Secondary | ICD-10-CM | POA: Diagnosis not present

## 2020-11-30 DIAGNOSIS — C50912 Malignant neoplasm of unspecified site of left female breast: Secondary | ICD-10-CM | POA: Diagnosis not present

## 2020-11-30 DIAGNOSIS — E785 Hyperlipidemia, unspecified: Secondary | ICD-10-CM | POA: Diagnosis not present

## 2020-11-30 DIAGNOSIS — I129 Hypertensive chronic kidney disease with stage 1 through stage 4 chronic kidney disease, or unspecified chronic kidney disease: Secondary | ICD-10-CM | POA: Diagnosis not present

## 2020-11-30 DIAGNOSIS — N189 Chronic kidney disease, unspecified: Secondary | ICD-10-CM | POA: Diagnosis not present

## 2020-11-30 NOTE — Telephone Encounter (Signed)
-----   Message from Marguarite Arbour sent at 11/29/2020  4:26 PM EDT ----- Home health needs reply

## 2020-11-30 NOTE — Telephone Encounter (Signed)
Duplicate.. see previous scan fax.Marland KitchenJohny Hampton

## 2020-12-02 ENCOUNTER — Telehealth: Payer: Self-pay | Admitting: *Deleted

## 2020-12-02 NOTE — Telephone Encounter (Signed)
Duplicate order.Marland Kitchen already faxed back.Marland KitchenJohny Chess

## 2020-12-02 NOTE — Telephone Encounter (Signed)
-----   Message from Marguarite Arbour sent at 11/29/2020  4:26 PM EDT ----- Home health needs reply

## 2020-12-02 NOTE — Telephone Encounter (Signed)
Faxed physician order back to assist living.Marland KitchenJohny Hampton

## 2020-12-02 NOTE — Telephone Encounter (Signed)
-----   Message from Marguarite Arbour sent at 11/26/2020  2:46 PM EDT ----- Please review and advise

## 2020-12-06 DIAGNOSIS — S21002D Unspecified open wound of left breast, subsequent encounter: Secondary | ICD-10-CM | POA: Diagnosis not present

## 2020-12-06 DIAGNOSIS — E538 Deficiency of other specified B group vitamins: Secondary | ICD-10-CM | POA: Diagnosis not present

## 2020-12-06 DIAGNOSIS — N393 Stress incontinence (female) (male): Secondary | ICD-10-CM | POA: Diagnosis not present

## 2020-12-06 DIAGNOSIS — H409 Unspecified glaucoma: Secondary | ICD-10-CM | POA: Diagnosis not present

## 2020-12-06 DIAGNOSIS — G4700 Insomnia, unspecified: Secondary | ICD-10-CM | POA: Diagnosis not present

## 2020-12-06 DIAGNOSIS — Z6825 Body mass index (BMI) 25.0-25.9, adult: Secondary | ICD-10-CM | POA: Diagnosis not present

## 2020-12-06 DIAGNOSIS — C50912 Malignant neoplasm of unspecified site of left female breast: Secondary | ICD-10-CM | POA: Diagnosis not present

## 2020-12-06 DIAGNOSIS — E785 Hyperlipidemia, unspecified: Secondary | ICD-10-CM | POA: Diagnosis not present

## 2020-12-06 DIAGNOSIS — I129 Hypertensive chronic kidney disease with stage 1 through stage 4 chronic kidney disease, or unspecified chronic kidney disease: Secondary | ICD-10-CM | POA: Diagnosis not present

## 2020-12-06 DIAGNOSIS — F419 Anxiety disorder, unspecified: Secondary | ICD-10-CM | POA: Diagnosis not present

## 2020-12-06 DIAGNOSIS — J329 Chronic sinusitis, unspecified: Secondary | ICD-10-CM | POA: Diagnosis not present

## 2020-12-06 DIAGNOSIS — F0150 Vascular dementia without behavioral disturbance: Secondary | ICD-10-CM | POA: Diagnosis not present

## 2020-12-06 DIAGNOSIS — N189 Chronic kidney disease, unspecified: Secondary | ICD-10-CM | POA: Diagnosis not present

## 2020-12-06 DIAGNOSIS — E559 Vitamin D deficiency, unspecified: Secondary | ICD-10-CM | POA: Diagnosis not present

## 2020-12-06 DIAGNOSIS — F32A Depression, unspecified: Secondary | ICD-10-CM | POA: Diagnosis not present

## 2020-12-06 DIAGNOSIS — I69318 Other symptoms and signs involving cognitive functions following cerebral infarction: Secondary | ICD-10-CM | POA: Diagnosis not present

## 2020-12-06 DIAGNOSIS — G629 Polyneuropathy, unspecified: Secondary | ICD-10-CM | POA: Diagnosis not present

## 2020-12-06 DIAGNOSIS — J302 Other seasonal allergic rhinitis: Secondary | ICD-10-CM | POA: Diagnosis not present

## 2020-12-06 DIAGNOSIS — K219 Gastro-esophageal reflux disease without esophagitis: Secondary | ICD-10-CM | POA: Diagnosis not present

## 2020-12-08 DIAGNOSIS — N189 Chronic kidney disease, unspecified: Secondary | ICD-10-CM | POA: Diagnosis not present

## 2020-12-08 DIAGNOSIS — E785 Hyperlipidemia, unspecified: Secondary | ICD-10-CM | POA: Diagnosis not present

## 2020-12-08 DIAGNOSIS — I69318 Other symptoms and signs involving cognitive functions following cerebral infarction: Secondary | ICD-10-CM | POA: Diagnosis not present

## 2020-12-08 DIAGNOSIS — C50912 Malignant neoplasm of unspecified site of left female breast: Secondary | ICD-10-CM | POA: Diagnosis not present

## 2020-12-08 DIAGNOSIS — I129 Hypertensive chronic kidney disease with stage 1 through stage 4 chronic kidney disease, or unspecified chronic kidney disease: Secondary | ICD-10-CM | POA: Diagnosis not present

## 2020-12-08 DIAGNOSIS — F0150 Vascular dementia without behavioral disturbance: Secondary | ICD-10-CM | POA: Diagnosis not present

## 2020-12-09 DIAGNOSIS — Z1159 Encounter for screening for other viral diseases: Secondary | ICD-10-CM | POA: Diagnosis not present

## 2020-12-09 DIAGNOSIS — Z20828 Contact with and (suspected) exposure to other viral communicable diseases: Secondary | ICD-10-CM | POA: Diagnosis not present

## 2020-12-10 ENCOUNTER — Telehealth: Payer: Self-pay | Admitting: *Deleted

## 2020-12-10 DIAGNOSIS — F0150 Vascular dementia without behavioral disturbance: Secondary | ICD-10-CM | POA: Diagnosis not present

## 2020-12-10 DIAGNOSIS — E785 Hyperlipidemia, unspecified: Secondary | ICD-10-CM | POA: Diagnosis not present

## 2020-12-10 DIAGNOSIS — I69318 Other symptoms and signs involving cognitive functions following cerebral infarction: Secondary | ICD-10-CM | POA: Diagnosis not present

## 2020-12-10 DIAGNOSIS — C50912 Malignant neoplasm of unspecified site of left female breast: Secondary | ICD-10-CM | POA: Diagnosis not present

## 2020-12-10 DIAGNOSIS — N189 Chronic kidney disease, unspecified: Secondary | ICD-10-CM | POA: Diagnosis not present

## 2020-12-10 DIAGNOSIS — I129 Hypertensive chronic kidney disease with stage 1 through stage 4 chronic kidney disease, or unspecified chronic kidney disease: Secondary | ICD-10-CM | POA: Diagnosis not present

## 2020-12-10 NOTE — Telephone Encounter (Signed)
-----   Message from Marguarite Arbour sent at 12/09/2020  4:15 PM EDT ----- For review

## 2020-12-10 NOTE — Telephone Encounter (Signed)
MD signed and faxed back [place].Marland KitchenJohny Chess

## 2020-12-15 DIAGNOSIS — Z8616 Personal history of COVID-19: Secondary | ICD-10-CM | POA: Diagnosis not present

## 2020-12-17 DIAGNOSIS — I129 Hypertensive chronic kidney disease with stage 1 through stage 4 chronic kidney disease, or unspecified chronic kidney disease: Secondary | ICD-10-CM | POA: Diagnosis not present

## 2020-12-17 DIAGNOSIS — E785 Hyperlipidemia, unspecified: Secondary | ICD-10-CM | POA: Diagnosis not present

## 2020-12-17 DIAGNOSIS — N189 Chronic kidney disease, unspecified: Secondary | ICD-10-CM | POA: Diagnosis not present

## 2020-12-17 DIAGNOSIS — C50912 Malignant neoplasm of unspecified site of left female breast: Secondary | ICD-10-CM | POA: Diagnosis not present

## 2020-12-17 DIAGNOSIS — I69318 Other symptoms and signs involving cognitive functions following cerebral infarction: Secondary | ICD-10-CM | POA: Diagnosis not present

## 2020-12-17 DIAGNOSIS — F0150 Vascular dementia without behavioral disturbance: Secondary | ICD-10-CM | POA: Diagnosis not present

## 2020-12-20 DIAGNOSIS — I129 Hypertensive chronic kidney disease with stage 1 through stage 4 chronic kidney disease, or unspecified chronic kidney disease: Secondary | ICD-10-CM | POA: Diagnosis not present

## 2020-12-20 DIAGNOSIS — C50912 Malignant neoplasm of unspecified site of left female breast: Secondary | ICD-10-CM | POA: Diagnosis not present

## 2020-12-20 DIAGNOSIS — E785 Hyperlipidemia, unspecified: Secondary | ICD-10-CM | POA: Diagnosis not present

## 2020-12-20 DIAGNOSIS — I69318 Other symptoms and signs involving cognitive functions following cerebral infarction: Secondary | ICD-10-CM | POA: Diagnosis not present

## 2020-12-20 DIAGNOSIS — F0150 Vascular dementia without behavioral disturbance: Secondary | ICD-10-CM | POA: Diagnosis not present

## 2020-12-20 DIAGNOSIS — N189 Chronic kidney disease, unspecified: Secondary | ICD-10-CM | POA: Diagnosis not present

## 2020-12-22 DIAGNOSIS — Z20828 Contact with and (suspected) exposure to other viral communicable diseases: Secondary | ICD-10-CM | POA: Diagnosis not present

## 2020-12-23 DIAGNOSIS — N189 Chronic kidney disease, unspecified: Secondary | ICD-10-CM | POA: Diagnosis not present

## 2020-12-23 DIAGNOSIS — E785 Hyperlipidemia, unspecified: Secondary | ICD-10-CM | POA: Diagnosis not present

## 2020-12-23 DIAGNOSIS — I69318 Other symptoms and signs involving cognitive functions following cerebral infarction: Secondary | ICD-10-CM | POA: Diagnosis not present

## 2020-12-23 DIAGNOSIS — F0150 Vascular dementia without behavioral disturbance: Secondary | ICD-10-CM | POA: Diagnosis not present

## 2020-12-23 DIAGNOSIS — I129 Hypertensive chronic kidney disease with stage 1 through stage 4 chronic kidney disease, or unspecified chronic kidney disease: Secondary | ICD-10-CM | POA: Diagnosis not present

## 2020-12-23 DIAGNOSIS — C50912 Malignant neoplasm of unspecified site of left female breast: Secondary | ICD-10-CM | POA: Diagnosis not present

## 2020-12-27 DIAGNOSIS — I129 Hypertensive chronic kidney disease with stage 1 through stage 4 chronic kidney disease, or unspecified chronic kidney disease: Secondary | ICD-10-CM | POA: Diagnosis not present

## 2020-12-27 DIAGNOSIS — N189 Chronic kidney disease, unspecified: Secondary | ICD-10-CM | POA: Diagnosis not present

## 2020-12-27 DIAGNOSIS — F0150 Vascular dementia without behavioral disturbance: Secondary | ICD-10-CM | POA: Diagnosis not present

## 2020-12-27 DIAGNOSIS — C50912 Malignant neoplasm of unspecified site of left female breast: Secondary | ICD-10-CM | POA: Diagnosis not present

## 2020-12-27 DIAGNOSIS — I69318 Other symptoms and signs involving cognitive functions following cerebral infarction: Secondary | ICD-10-CM | POA: Diagnosis not present

## 2020-12-27 DIAGNOSIS — E785 Hyperlipidemia, unspecified: Secondary | ICD-10-CM | POA: Diagnosis not present

## 2020-12-28 DIAGNOSIS — E785 Hyperlipidemia, unspecified: Secondary | ICD-10-CM | POA: Diagnosis not present

## 2020-12-28 DIAGNOSIS — F0150 Vascular dementia without behavioral disturbance: Secondary | ICD-10-CM | POA: Diagnosis not present

## 2020-12-28 DIAGNOSIS — C50912 Malignant neoplasm of unspecified site of left female breast: Secondary | ICD-10-CM | POA: Diagnosis not present

## 2020-12-28 DIAGNOSIS — I69318 Other symptoms and signs involving cognitive functions following cerebral infarction: Secondary | ICD-10-CM | POA: Diagnosis not present

## 2020-12-28 DIAGNOSIS — N189 Chronic kidney disease, unspecified: Secondary | ICD-10-CM | POA: Diagnosis not present

## 2020-12-28 DIAGNOSIS — I129 Hypertensive chronic kidney disease with stage 1 through stage 4 chronic kidney disease, or unspecified chronic kidney disease: Secondary | ICD-10-CM | POA: Diagnosis not present

## 2020-12-29 DIAGNOSIS — Z20828 Contact with and (suspected) exposure to other viral communicable diseases: Secondary | ICD-10-CM | POA: Diagnosis not present

## 2020-12-30 DIAGNOSIS — I129 Hypertensive chronic kidney disease with stage 1 through stage 4 chronic kidney disease, or unspecified chronic kidney disease: Secondary | ICD-10-CM | POA: Diagnosis not present

## 2020-12-30 DIAGNOSIS — I69318 Other symptoms and signs involving cognitive functions following cerebral infarction: Secondary | ICD-10-CM | POA: Diagnosis not present

## 2020-12-30 DIAGNOSIS — C50912 Malignant neoplasm of unspecified site of left female breast: Secondary | ICD-10-CM | POA: Diagnosis not present

## 2020-12-30 DIAGNOSIS — N189 Chronic kidney disease, unspecified: Secondary | ICD-10-CM | POA: Diagnosis not present

## 2020-12-30 DIAGNOSIS — F0150 Vascular dementia without behavioral disturbance: Secondary | ICD-10-CM | POA: Diagnosis not present

## 2020-12-30 DIAGNOSIS — E785 Hyperlipidemia, unspecified: Secondary | ICD-10-CM | POA: Diagnosis not present

## 2021-01-03 DIAGNOSIS — F0150 Vascular dementia without behavioral disturbance: Secondary | ICD-10-CM | POA: Diagnosis not present

## 2021-01-03 DIAGNOSIS — E785 Hyperlipidemia, unspecified: Secondary | ICD-10-CM | POA: Diagnosis not present

## 2021-01-03 DIAGNOSIS — I69318 Other symptoms and signs involving cognitive functions following cerebral infarction: Secondary | ICD-10-CM | POA: Diagnosis not present

## 2021-01-03 DIAGNOSIS — C50912 Malignant neoplasm of unspecified site of left female breast: Secondary | ICD-10-CM | POA: Diagnosis not present

## 2021-01-03 DIAGNOSIS — I129 Hypertensive chronic kidney disease with stage 1 through stage 4 chronic kidney disease, or unspecified chronic kidney disease: Secondary | ICD-10-CM | POA: Diagnosis not present

## 2021-01-03 DIAGNOSIS — N189 Chronic kidney disease, unspecified: Secondary | ICD-10-CM | POA: Diagnosis not present

## 2021-01-05 DIAGNOSIS — Z20828 Contact with and (suspected) exposure to other viral communicable diseases: Secondary | ICD-10-CM | POA: Diagnosis not present

## 2021-01-06 DIAGNOSIS — K219 Gastro-esophageal reflux disease without esophagitis: Secondary | ICD-10-CM | POA: Diagnosis not present

## 2021-01-06 DIAGNOSIS — S21002D Unspecified open wound of left breast, subsequent encounter: Secondary | ICD-10-CM | POA: Diagnosis not present

## 2021-01-06 DIAGNOSIS — E538 Deficiency of other specified B group vitamins: Secondary | ICD-10-CM | POA: Diagnosis not present

## 2021-01-06 DIAGNOSIS — I129 Hypertensive chronic kidney disease with stage 1 through stage 4 chronic kidney disease, or unspecified chronic kidney disease: Secondary | ICD-10-CM | POA: Diagnosis not present

## 2021-01-06 DIAGNOSIS — I69318 Other symptoms and signs involving cognitive functions following cerebral infarction: Secondary | ICD-10-CM | POA: Diagnosis not present

## 2021-01-06 DIAGNOSIS — J329 Chronic sinusitis, unspecified: Secondary | ICD-10-CM | POA: Diagnosis not present

## 2021-01-06 DIAGNOSIS — C50912 Malignant neoplasm of unspecified site of left female breast: Secondary | ICD-10-CM | POA: Diagnosis not present

## 2021-01-06 DIAGNOSIS — F419 Anxiety disorder, unspecified: Secondary | ICD-10-CM | POA: Diagnosis not present

## 2021-01-06 DIAGNOSIS — E785 Hyperlipidemia, unspecified: Secondary | ICD-10-CM | POA: Diagnosis not present

## 2021-01-06 DIAGNOSIS — N189 Chronic kidney disease, unspecified: Secondary | ICD-10-CM | POA: Diagnosis not present

## 2021-01-06 DIAGNOSIS — F32A Depression, unspecified: Secondary | ICD-10-CM | POA: Diagnosis not present

## 2021-01-06 DIAGNOSIS — G629 Polyneuropathy, unspecified: Secondary | ICD-10-CM | POA: Diagnosis not present

## 2021-01-06 DIAGNOSIS — E559 Vitamin D deficiency, unspecified: Secondary | ICD-10-CM | POA: Diagnosis not present

## 2021-01-06 DIAGNOSIS — F0150 Vascular dementia without behavioral disturbance: Secondary | ICD-10-CM | POA: Diagnosis not present

## 2021-01-06 DIAGNOSIS — N393 Stress incontinence (female) (male): Secondary | ICD-10-CM | POA: Diagnosis not present

## 2021-01-06 DIAGNOSIS — H409 Unspecified glaucoma: Secondary | ICD-10-CM | POA: Diagnosis not present

## 2021-01-06 DIAGNOSIS — J302 Other seasonal allergic rhinitis: Secondary | ICD-10-CM | POA: Diagnosis not present

## 2021-01-06 DIAGNOSIS — Z6825 Body mass index (BMI) 25.0-25.9, adult: Secondary | ICD-10-CM | POA: Diagnosis not present

## 2021-01-06 DIAGNOSIS — G4700 Insomnia, unspecified: Secondary | ICD-10-CM | POA: Diagnosis not present

## 2021-01-11 DIAGNOSIS — N189 Chronic kidney disease, unspecified: Secondary | ICD-10-CM | POA: Diagnosis not present

## 2021-01-11 DIAGNOSIS — I69318 Other symptoms and signs involving cognitive functions following cerebral infarction: Secondary | ICD-10-CM | POA: Diagnosis not present

## 2021-01-11 DIAGNOSIS — F0150 Vascular dementia without behavioral disturbance: Secondary | ICD-10-CM | POA: Diagnosis not present

## 2021-01-11 DIAGNOSIS — E785 Hyperlipidemia, unspecified: Secondary | ICD-10-CM | POA: Diagnosis not present

## 2021-01-11 DIAGNOSIS — I129 Hypertensive chronic kidney disease with stage 1 through stage 4 chronic kidney disease, or unspecified chronic kidney disease: Secondary | ICD-10-CM | POA: Diagnosis not present

## 2021-01-11 DIAGNOSIS — C50912 Malignant neoplasm of unspecified site of left female breast: Secondary | ICD-10-CM | POA: Diagnosis not present

## 2021-01-12 DIAGNOSIS — F0150 Vascular dementia without behavioral disturbance: Secondary | ICD-10-CM | POA: Diagnosis not present

## 2021-01-12 DIAGNOSIS — Z20828 Contact with and (suspected) exposure to other viral communicable diseases: Secondary | ICD-10-CM | POA: Diagnosis not present

## 2021-01-12 DIAGNOSIS — N189 Chronic kidney disease, unspecified: Secondary | ICD-10-CM | POA: Diagnosis not present

## 2021-01-12 DIAGNOSIS — E785 Hyperlipidemia, unspecified: Secondary | ICD-10-CM | POA: Diagnosis not present

## 2021-01-12 DIAGNOSIS — C50912 Malignant neoplasm of unspecified site of left female breast: Secondary | ICD-10-CM | POA: Diagnosis not present

## 2021-01-12 DIAGNOSIS — I129 Hypertensive chronic kidney disease with stage 1 through stage 4 chronic kidney disease, or unspecified chronic kidney disease: Secondary | ICD-10-CM | POA: Diagnosis not present

## 2021-01-12 DIAGNOSIS — I69318 Other symptoms and signs involving cognitive functions following cerebral infarction: Secondary | ICD-10-CM | POA: Diagnosis not present

## 2021-01-14 DIAGNOSIS — I129 Hypertensive chronic kidney disease with stage 1 through stage 4 chronic kidney disease, or unspecified chronic kidney disease: Secondary | ICD-10-CM | POA: Diagnosis not present

## 2021-01-14 DIAGNOSIS — E785 Hyperlipidemia, unspecified: Secondary | ICD-10-CM | POA: Diagnosis not present

## 2021-01-14 DIAGNOSIS — I69318 Other symptoms and signs involving cognitive functions following cerebral infarction: Secondary | ICD-10-CM | POA: Diagnosis not present

## 2021-01-14 DIAGNOSIS — F0150 Vascular dementia without behavioral disturbance: Secondary | ICD-10-CM | POA: Diagnosis not present

## 2021-01-14 DIAGNOSIS — N189 Chronic kidney disease, unspecified: Secondary | ICD-10-CM | POA: Diagnosis not present

## 2021-01-14 DIAGNOSIS — C50912 Malignant neoplasm of unspecified site of left female breast: Secondary | ICD-10-CM | POA: Diagnosis not present

## 2021-01-17 DIAGNOSIS — E785 Hyperlipidemia, unspecified: Secondary | ICD-10-CM | POA: Diagnosis not present

## 2021-01-17 DIAGNOSIS — F0150 Vascular dementia without behavioral disturbance: Secondary | ICD-10-CM | POA: Diagnosis not present

## 2021-01-17 DIAGNOSIS — C50912 Malignant neoplasm of unspecified site of left female breast: Secondary | ICD-10-CM | POA: Diagnosis not present

## 2021-01-17 DIAGNOSIS — I129 Hypertensive chronic kidney disease with stage 1 through stage 4 chronic kidney disease, or unspecified chronic kidney disease: Secondary | ICD-10-CM | POA: Diagnosis not present

## 2021-01-17 DIAGNOSIS — N189 Chronic kidney disease, unspecified: Secondary | ICD-10-CM | POA: Diagnosis not present

## 2021-01-17 DIAGNOSIS — I69318 Other symptoms and signs involving cognitive functions following cerebral infarction: Secondary | ICD-10-CM | POA: Diagnosis not present

## 2021-01-18 DIAGNOSIS — M2041 Other hammer toe(s) (acquired), right foot: Secondary | ICD-10-CM | POA: Diagnosis not present

## 2021-01-18 DIAGNOSIS — B351 Tinea unguium: Secondary | ICD-10-CM | POA: Diagnosis not present

## 2021-01-18 DIAGNOSIS — M79674 Pain in right toe(s): Secondary | ICD-10-CM | POA: Diagnosis not present

## 2021-01-19 DIAGNOSIS — Z20828 Contact with and (suspected) exposure to other viral communicable diseases: Secondary | ICD-10-CM | POA: Diagnosis not present

## 2021-01-24 DIAGNOSIS — F0150 Vascular dementia without behavioral disturbance: Secondary | ICD-10-CM | POA: Diagnosis not present

## 2021-01-24 DIAGNOSIS — E785 Hyperlipidemia, unspecified: Secondary | ICD-10-CM | POA: Diagnosis not present

## 2021-01-24 DIAGNOSIS — C50912 Malignant neoplasm of unspecified site of left female breast: Secondary | ICD-10-CM | POA: Diagnosis not present

## 2021-01-24 DIAGNOSIS — N189 Chronic kidney disease, unspecified: Secondary | ICD-10-CM | POA: Diagnosis not present

## 2021-01-24 DIAGNOSIS — I129 Hypertensive chronic kidney disease with stage 1 through stage 4 chronic kidney disease, or unspecified chronic kidney disease: Secondary | ICD-10-CM | POA: Diagnosis not present

## 2021-01-24 DIAGNOSIS — I69318 Other symptoms and signs involving cognitive functions following cerebral infarction: Secondary | ICD-10-CM | POA: Diagnosis not present

## 2021-01-25 ENCOUNTER — Telehealth: Payer: Self-pay

## 2021-01-25 NOTE — Telephone Encounter (Signed)
Please advise as the [place] paperwork is not going threw via [facility]. Only the 1st page is coming through and the rest of the pages are blank.  **Also a medication list was sent over and is needed to be faxed back as well.  Remo Lipps asked that the papers please be re-faxed.

## 2021-01-25 NOTE — Telephone Encounter (Signed)
Refaxed again.Marland KitchenJohny Hampton

## 2021-01-26 DIAGNOSIS — Z20828 Contact with and (suspected) exposure to other viral communicable diseases: Secondary | ICD-10-CM | POA: Diagnosis not present

## 2021-01-31 DIAGNOSIS — I129 Hypertensive chronic kidney disease with stage 1 through stage 4 chronic kidney disease, or unspecified chronic kidney disease: Secondary | ICD-10-CM | POA: Diagnosis not present

## 2021-01-31 DIAGNOSIS — I69318 Other symptoms and signs involving cognitive functions following cerebral infarction: Secondary | ICD-10-CM | POA: Diagnosis not present

## 2021-01-31 DIAGNOSIS — C50912 Malignant neoplasm of unspecified site of left female breast: Secondary | ICD-10-CM | POA: Diagnosis not present

## 2021-01-31 DIAGNOSIS — F0150 Vascular dementia without behavioral disturbance: Secondary | ICD-10-CM | POA: Diagnosis not present

## 2021-01-31 DIAGNOSIS — E785 Hyperlipidemia, unspecified: Secondary | ICD-10-CM | POA: Diagnosis not present

## 2021-01-31 DIAGNOSIS — N189 Chronic kidney disease, unspecified: Secondary | ICD-10-CM | POA: Diagnosis not present

## 2021-02-02 DIAGNOSIS — Z20828 Contact with and (suspected) exposure to other viral communicable diseases: Secondary | ICD-10-CM | POA: Diagnosis not present

## 2021-02-05 DIAGNOSIS — H409 Unspecified glaucoma: Secondary | ICD-10-CM | POA: Diagnosis not present

## 2021-02-05 DIAGNOSIS — G4700 Insomnia, unspecified: Secondary | ICD-10-CM | POA: Diagnosis not present

## 2021-02-05 DIAGNOSIS — I69318 Other symptoms and signs involving cognitive functions following cerebral infarction: Secondary | ICD-10-CM | POA: Diagnosis not present

## 2021-02-05 DIAGNOSIS — F419 Anxiety disorder, unspecified: Secondary | ICD-10-CM | POA: Diagnosis not present

## 2021-02-05 DIAGNOSIS — I129 Hypertensive chronic kidney disease with stage 1 through stage 4 chronic kidney disease, or unspecified chronic kidney disease: Secondary | ICD-10-CM | POA: Diagnosis not present

## 2021-02-05 DIAGNOSIS — C50912 Malignant neoplasm of unspecified site of left female breast: Secondary | ICD-10-CM | POA: Diagnosis not present

## 2021-02-05 DIAGNOSIS — S21002D Unspecified open wound of left breast, subsequent encounter: Secondary | ICD-10-CM | POA: Diagnosis not present

## 2021-02-05 DIAGNOSIS — N189 Chronic kidney disease, unspecified: Secondary | ICD-10-CM | POA: Diagnosis not present

## 2021-02-05 DIAGNOSIS — E559 Vitamin D deficiency, unspecified: Secondary | ICD-10-CM | POA: Diagnosis not present

## 2021-02-05 DIAGNOSIS — N393 Stress incontinence (female) (male): Secondary | ICD-10-CM | POA: Diagnosis not present

## 2021-02-05 DIAGNOSIS — F0150 Vascular dementia without behavioral disturbance: Secondary | ICD-10-CM | POA: Diagnosis not present

## 2021-02-05 DIAGNOSIS — E785 Hyperlipidemia, unspecified: Secondary | ICD-10-CM | POA: Diagnosis not present

## 2021-02-05 DIAGNOSIS — J302 Other seasonal allergic rhinitis: Secondary | ICD-10-CM | POA: Diagnosis not present

## 2021-02-05 DIAGNOSIS — E538 Deficiency of other specified B group vitamins: Secondary | ICD-10-CM | POA: Diagnosis not present

## 2021-02-05 DIAGNOSIS — F32A Depression, unspecified: Secondary | ICD-10-CM | POA: Diagnosis not present

## 2021-02-05 DIAGNOSIS — G629 Polyneuropathy, unspecified: Secondary | ICD-10-CM | POA: Diagnosis not present

## 2021-02-05 DIAGNOSIS — Z6825 Body mass index (BMI) 25.0-25.9, adult: Secondary | ICD-10-CM | POA: Diagnosis not present

## 2021-02-05 DIAGNOSIS — K219 Gastro-esophageal reflux disease without esophagitis: Secondary | ICD-10-CM | POA: Diagnosis not present

## 2021-02-05 DIAGNOSIS — J329 Chronic sinusitis, unspecified: Secondary | ICD-10-CM | POA: Diagnosis not present

## 2021-02-07 DIAGNOSIS — C50912 Malignant neoplasm of unspecified site of left female breast: Secondary | ICD-10-CM | POA: Diagnosis not present

## 2021-02-07 DIAGNOSIS — N189 Chronic kidney disease, unspecified: Secondary | ICD-10-CM | POA: Diagnosis not present

## 2021-02-07 DIAGNOSIS — I69318 Other symptoms and signs involving cognitive functions following cerebral infarction: Secondary | ICD-10-CM | POA: Diagnosis not present

## 2021-02-07 DIAGNOSIS — I129 Hypertensive chronic kidney disease with stage 1 through stage 4 chronic kidney disease, or unspecified chronic kidney disease: Secondary | ICD-10-CM | POA: Diagnosis not present

## 2021-02-07 DIAGNOSIS — E785 Hyperlipidemia, unspecified: Secondary | ICD-10-CM | POA: Diagnosis not present

## 2021-02-07 DIAGNOSIS — F0150 Vascular dementia without behavioral disturbance: Secondary | ICD-10-CM | POA: Diagnosis not present

## 2021-02-08 DIAGNOSIS — N189 Chronic kidney disease, unspecified: Secondary | ICD-10-CM | POA: Diagnosis not present

## 2021-02-08 DIAGNOSIS — I69318 Other symptoms and signs involving cognitive functions following cerebral infarction: Secondary | ICD-10-CM | POA: Diagnosis not present

## 2021-02-08 DIAGNOSIS — E785 Hyperlipidemia, unspecified: Secondary | ICD-10-CM | POA: Diagnosis not present

## 2021-02-08 DIAGNOSIS — F0150 Vascular dementia without behavioral disturbance: Secondary | ICD-10-CM | POA: Diagnosis not present

## 2021-02-08 DIAGNOSIS — C50912 Malignant neoplasm of unspecified site of left female breast: Secondary | ICD-10-CM | POA: Diagnosis not present

## 2021-02-08 DIAGNOSIS — I129 Hypertensive chronic kidney disease with stage 1 through stage 4 chronic kidney disease, or unspecified chronic kidney disease: Secondary | ICD-10-CM | POA: Diagnosis not present

## 2021-02-09 DIAGNOSIS — I129 Hypertensive chronic kidney disease with stage 1 through stage 4 chronic kidney disease, or unspecified chronic kidney disease: Secondary | ICD-10-CM | POA: Diagnosis not present

## 2021-02-09 DIAGNOSIS — F0150 Vascular dementia without behavioral disturbance: Secondary | ICD-10-CM | POA: Diagnosis not present

## 2021-02-09 DIAGNOSIS — C50912 Malignant neoplasm of unspecified site of left female breast: Secondary | ICD-10-CM | POA: Diagnosis not present

## 2021-02-09 DIAGNOSIS — E785 Hyperlipidemia, unspecified: Secondary | ICD-10-CM | POA: Diagnosis not present

## 2021-02-09 DIAGNOSIS — N189 Chronic kidney disease, unspecified: Secondary | ICD-10-CM | POA: Diagnosis not present

## 2021-02-09 DIAGNOSIS — I69318 Other symptoms and signs involving cognitive functions following cerebral infarction: Secondary | ICD-10-CM | POA: Diagnosis not present

## 2021-02-11 DIAGNOSIS — C50912 Malignant neoplasm of unspecified site of left female breast: Secondary | ICD-10-CM | POA: Diagnosis not present

## 2021-02-11 DIAGNOSIS — I69318 Other symptoms and signs involving cognitive functions following cerebral infarction: Secondary | ICD-10-CM | POA: Diagnosis not present

## 2021-02-11 DIAGNOSIS — N189 Chronic kidney disease, unspecified: Secondary | ICD-10-CM | POA: Diagnosis not present

## 2021-02-11 DIAGNOSIS — F0150 Vascular dementia without behavioral disturbance: Secondary | ICD-10-CM | POA: Diagnosis not present

## 2021-02-11 DIAGNOSIS — E785 Hyperlipidemia, unspecified: Secondary | ICD-10-CM | POA: Diagnosis not present

## 2021-02-11 DIAGNOSIS — I129 Hypertensive chronic kidney disease with stage 1 through stage 4 chronic kidney disease, or unspecified chronic kidney disease: Secondary | ICD-10-CM | POA: Diagnosis not present

## 2021-02-14 DIAGNOSIS — C50912 Malignant neoplasm of unspecified site of left female breast: Secondary | ICD-10-CM | POA: Diagnosis not present

## 2021-02-14 DIAGNOSIS — F0150 Vascular dementia without behavioral disturbance: Secondary | ICD-10-CM | POA: Diagnosis not present

## 2021-02-14 DIAGNOSIS — I69318 Other symptoms and signs involving cognitive functions following cerebral infarction: Secondary | ICD-10-CM | POA: Diagnosis not present

## 2021-02-14 DIAGNOSIS — N189 Chronic kidney disease, unspecified: Secondary | ICD-10-CM | POA: Diagnosis not present

## 2021-02-14 DIAGNOSIS — I129 Hypertensive chronic kidney disease with stage 1 through stage 4 chronic kidney disease, or unspecified chronic kidney disease: Secondary | ICD-10-CM | POA: Diagnosis not present

## 2021-02-14 DIAGNOSIS — E785 Hyperlipidemia, unspecified: Secondary | ICD-10-CM | POA: Diagnosis not present

## 2021-02-16 DIAGNOSIS — I69318 Other symptoms and signs involving cognitive functions following cerebral infarction: Secondary | ICD-10-CM | POA: Diagnosis not present

## 2021-02-16 DIAGNOSIS — N189 Chronic kidney disease, unspecified: Secondary | ICD-10-CM | POA: Diagnosis not present

## 2021-02-16 DIAGNOSIS — Z20828 Contact with and (suspected) exposure to other viral communicable diseases: Secondary | ICD-10-CM | POA: Diagnosis not present

## 2021-02-16 DIAGNOSIS — I129 Hypertensive chronic kidney disease with stage 1 through stage 4 chronic kidney disease, or unspecified chronic kidney disease: Secondary | ICD-10-CM | POA: Diagnosis not present

## 2021-02-16 DIAGNOSIS — C50912 Malignant neoplasm of unspecified site of left female breast: Secondary | ICD-10-CM | POA: Diagnosis not present

## 2021-02-16 DIAGNOSIS — E785 Hyperlipidemia, unspecified: Secondary | ICD-10-CM | POA: Diagnosis not present

## 2021-02-16 DIAGNOSIS — F0150 Vascular dementia without behavioral disturbance: Secondary | ICD-10-CM | POA: Diagnosis not present

## 2021-02-18 DIAGNOSIS — N189 Chronic kidney disease, unspecified: Secondary | ICD-10-CM | POA: Diagnosis not present

## 2021-02-18 DIAGNOSIS — F0150 Vascular dementia without behavioral disturbance: Secondary | ICD-10-CM | POA: Diagnosis not present

## 2021-02-18 DIAGNOSIS — E785 Hyperlipidemia, unspecified: Secondary | ICD-10-CM | POA: Diagnosis not present

## 2021-02-18 DIAGNOSIS — I69318 Other symptoms and signs involving cognitive functions following cerebral infarction: Secondary | ICD-10-CM | POA: Diagnosis not present

## 2021-02-18 DIAGNOSIS — I129 Hypertensive chronic kidney disease with stage 1 through stage 4 chronic kidney disease, or unspecified chronic kidney disease: Secondary | ICD-10-CM | POA: Diagnosis not present

## 2021-02-18 DIAGNOSIS — C50912 Malignant neoplasm of unspecified site of left female breast: Secondary | ICD-10-CM | POA: Diagnosis not present

## 2021-02-21 DIAGNOSIS — C50912 Malignant neoplasm of unspecified site of left female breast: Secondary | ICD-10-CM | POA: Diagnosis not present

## 2021-02-21 DIAGNOSIS — I129 Hypertensive chronic kidney disease with stage 1 through stage 4 chronic kidney disease, or unspecified chronic kidney disease: Secondary | ICD-10-CM | POA: Diagnosis not present

## 2021-02-21 DIAGNOSIS — F0150 Vascular dementia without behavioral disturbance: Secondary | ICD-10-CM | POA: Diagnosis not present

## 2021-02-21 DIAGNOSIS — I69318 Other symptoms and signs involving cognitive functions following cerebral infarction: Secondary | ICD-10-CM | POA: Diagnosis not present

## 2021-02-21 DIAGNOSIS — E785 Hyperlipidemia, unspecified: Secondary | ICD-10-CM | POA: Diagnosis not present

## 2021-02-21 DIAGNOSIS — N189 Chronic kidney disease, unspecified: Secondary | ICD-10-CM | POA: Diagnosis not present

## 2021-02-23 DIAGNOSIS — I69318 Other symptoms and signs involving cognitive functions following cerebral infarction: Secondary | ICD-10-CM | POA: Diagnosis not present

## 2021-02-23 DIAGNOSIS — Z20828 Contact with and (suspected) exposure to other viral communicable diseases: Secondary | ICD-10-CM | POA: Diagnosis not present

## 2021-02-23 DIAGNOSIS — N189 Chronic kidney disease, unspecified: Secondary | ICD-10-CM | POA: Diagnosis not present

## 2021-02-23 DIAGNOSIS — E785 Hyperlipidemia, unspecified: Secondary | ICD-10-CM | POA: Diagnosis not present

## 2021-02-23 DIAGNOSIS — C50912 Malignant neoplasm of unspecified site of left female breast: Secondary | ICD-10-CM | POA: Diagnosis not present

## 2021-02-23 DIAGNOSIS — I129 Hypertensive chronic kidney disease with stage 1 through stage 4 chronic kidney disease, or unspecified chronic kidney disease: Secondary | ICD-10-CM | POA: Diagnosis not present

## 2021-02-23 DIAGNOSIS — F0150 Vascular dementia without behavioral disturbance: Secondary | ICD-10-CM | POA: Diagnosis not present

## 2021-02-25 DIAGNOSIS — E785 Hyperlipidemia, unspecified: Secondary | ICD-10-CM | POA: Diagnosis not present

## 2021-02-25 DIAGNOSIS — F0150 Vascular dementia without behavioral disturbance: Secondary | ICD-10-CM | POA: Diagnosis not present

## 2021-02-25 DIAGNOSIS — C50912 Malignant neoplasm of unspecified site of left female breast: Secondary | ICD-10-CM | POA: Diagnosis not present

## 2021-02-25 DIAGNOSIS — N189 Chronic kidney disease, unspecified: Secondary | ICD-10-CM | POA: Diagnosis not present

## 2021-02-25 DIAGNOSIS — I69318 Other symptoms and signs involving cognitive functions following cerebral infarction: Secondary | ICD-10-CM | POA: Diagnosis not present

## 2021-02-25 DIAGNOSIS — I129 Hypertensive chronic kidney disease with stage 1 through stage 4 chronic kidney disease, or unspecified chronic kidney disease: Secondary | ICD-10-CM | POA: Diagnosis not present

## 2021-02-26 DIAGNOSIS — N189 Chronic kidney disease, unspecified: Secondary | ICD-10-CM | POA: Diagnosis not present

## 2021-02-26 DIAGNOSIS — C50912 Malignant neoplasm of unspecified site of left female breast: Secondary | ICD-10-CM | POA: Diagnosis not present

## 2021-02-26 DIAGNOSIS — I69318 Other symptoms and signs involving cognitive functions following cerebral infarction: Secondary | ICD-10-CM | POA: Diagnosis not present

## 2021-02-26 DIAGNOSIS — E785 Hyperlipidemia, unspecified: Secondary | ICD-10-CM | POA: Diagnosis not present

## 2021-02-26 DIAGNOSIS — I129 Hypertensive chronic kidney disease with stage 1 through stage 4 chronic kidney disease, or unspecified chronic kidney disease: Secondary | ICD-10-CM | POA: Diagnosis not present

## 2021-02-26 DIAGNOSIS — F0150 Vascular dementia without behavioral disturbance: Secondary | ICD-10-CM | POA: Diagnosis not present

## 2021-02-28 DIAGNOSIS — C50912 Malignant neoplasm of unspecified site of left female breast: Secondary | ICD-10-CM | POA: Diagnosis not present

## 2021-02-28 DIAGNOSIS — I129 Hypertensive chronic kidney disease with stage 1 through stage 4 chronic kidney disease, or unspecified chronic kidney disease: Secondary | ICD-10-CM | POA: Diagnosis not present

## 2021-02-28 DIAGNOSIS — N189 Chronic kidney disease, unspecified: Secondary | ICD-10-CM | POA: Diagnosis not present

## 2021-02-28 DIAGNOSIS — E785 Hyperlipidemia, unspecified: Secondary | ICD-10-CM | POA: Diagnosis not present

## 2021-02-28 DIAGNOSIS — F0150 Vascular dementia without behavioral disturbance: Secondary | ICD-10-CM | POA: Diagnosis not present

## 2021-02-28 DIAGNOSIS — I69318 Other symptoms and signs involving cognitive functions following cerebral infarction: Secondary | ICD-10-CM | POA: Diagnosis not present

## 2021-03-02 DIAGNOSIS — C50912 Malignant neoplasm of unspecified site of left female breast: Secondary | ICD-10-CM | POA: Diagnosis not present

## 2021-03-02 DIAGNOSIS — I69318 Other symptoms and signs involving cognitive functions following cerebral infarction: Secondary | ICD-10-CM | POA: Diagnosis not present

## 2021-03-02 DIAGNOSIS — N189 Chronic kidney disease, unspecified: Secondary | ICD-10-CM | POA: Diagnosis not present

## 2021-03-02 DIAGNOSIS — F0150 Vascular dementia without behavioral disturbance: Secondary | ICD-10-CM | POA: Diagnosis not present

## 2021-03-02 DIAGNOSIS — E785 Hyperlipidemia, unspecified: Secondary | ICD-10-CM | POA: Diagnosis not present

## 2021-03-02 DIAGNOSIS — I129 Hypertensive chronic kidney disease with stage 1 through stage 4 chronic kidney disease, or unspecified chronic kidney disease: Secondary | ICD-10-CM | POA: Diagnosis not present

## 2021-03-04 DIAGNOSIS — I129 Hypertensive chronic kidney disease with stage 1 through stage 4 chronic kidney disease, or unspecified chronic kidney disease: Secondary | ICD-10-CM | POA: Diagnosis not present

## 2021-03-04 DIAGNOSIS — F0150 Vascular dementia without behavioral disturbance: Secondary | ICD-10-CM | POA: Diagnosis not present

## 2021-03-04 DIAGNOSIS — C50912 Malignant neoplasm of unspecified site of left female breast: Secondary | ICD-10-CM | POA: Diagnosis not present

## 2021-03-04 DIAGNOSIS — N189 Chronic kidney disease, unspecified: Secondary | ICD-10-CM | POA: Diagnosis not present

## 2021-03-04 DIAGNOSIS — I69318 Other symptoms and signs involving cognitive functions following cerebral infarction: Secondary | ICD-10-CM | POA: Diagnosis not present

## 2021-03-04 DIAGNOSIS — E785 Hyperlipidemia, unspecified: Secondary | ICD-10-CM | POA: Diagnosis not present

## 2021-03-07 DIAGNOSIS — C50912 Malignant neoplasm of unspecified site of left female breast: Secondary | ICD-10-CM | POA: Diagnosis not present

## 2021-03-07 DIAGNOSIS — E785 Hyperlipidemia, unspecified: Secondary | ICD-10-CM | POA: Diagnosis not present

## 2021-03-07 DIAGNOSIS — F0150 Vascular dementia without behavioral disturbance: Secondary | ICD-10-CM | POA: Diagnosis not present

## 2021-03-07 DIAGNOSIS — N189 Chronic kidney disease, unspecified: Secondary | ICD-10-CM | POA: Diagnosis not present

## 2021-03-07 DIAGNOSIS — I69318 Other symptoms and signs involving cognitive functions following cerebral infarction: Secondary | ICD-10-CM | POA: Diagnosis not present

## 2021-03-07 DIAGNOSIS — I129 Hypertensive chronic kidney disease with stage 1 through stage 4 chronic kidney disease, or unspecified chronic kidney disease: Secondary | ICD-10-CM | POA: Diagnosis not present

## 2021-03-08 DIAGNOSIS — N393 Stress incontinence (female) (male): Secondary | ICD-10-CM | POA: Diagnosis not present

## 2021-03-08 DIAGNOSIS — E538 Deficiency of other specified B group vitamins: Secondary | ICD-10-CM | POA: Diagnosis not present

## 2021-03-08 DIAGNOSIS — G4700 Insomnia, unspecified: Secondary | ICD-10-CM | POA: Diagnosis not present

## 2021-03-08 DIAGNOSIS — N189 Chronic kidney disease, unspecified: Secondary | ICD-10-CM | POA: Diagnosis not present

## 2021-03-08 DIAGNOSIS — J302 Other seasonal allergic rhinitis: Secondary | ICD-10-CM | POA: Diagnosis not present

## 2021-03-08 DIAGNOSIS — F32A Depression, unspecified: Secondary | ICD-10-CM | POA: Diagnosis not present

## 2021-03-08 DIAGNOSIS — F0150 Vascular dementia without behavioral disturbance: Secondary | ICD-10-CM | POA: Diagnosis not present

## 2021-03-08 DIAGNOSIS — G629 Polyneuropathy, unspecified: Secondary | ICD-10-CM | POA: Diagnosis not present

## 2021-03-08 DIAGNOSIS — E559 Vitamin D deficiency, unspecified: Secondary | ICD-10-CM | POA: Diagnosis not present

## 2021-03-08 DIAGNOSIS — S21002D Unspecified open wound of left breast, subsequent encounter: Secondary | ICD-10-CM | POA: Diagnosis not present

## 2021-03-08 DIAGNOSIS — F419 Anxiety disorder, unspecified: Secondary | ICD-10-CM | POA: Diagnosis not present

## 2021-03-08 DIAGNOSIS — C50912 Malignant neoplasm of unspecified site of left female breast: Secondary | ICD-10-CM | POA: Diagnosis not present

## 2021-03-08 DIAGNOSIS — Z6825 Body mass index (BMI) 25.0-25.9, adult: Secondary | ICD-10-CM | POA: Diagnosis not present

## 2021-03-08 DIAGNOSIS — E785 Hyperlipidemia, unspecified: Secondary | ICD-10-CM | POA: Diagnosis not present

## 2021-03-08 DIAGNOSIS — J329 Chronic sinusitis, unspecified: Secondary | ICD-10-CM | POA: Diagnosis not present

## 2021-03-08 DIAGNOSIS — H409 Unspecified glaucoma: Secondary | ICD-10-CM | POA: Diagnosis not present

## 2021-03-08 DIAGNOSIS — I69318 Other symptoms and signs involving cognitive functions following cerebral infarction: Secondary | ICD-10-CM | POA: Diagnosis not present

## 2021-03-08 DIAGNOSIS — I129 Hypertensive chronic kidney disease with stage 1 through stage 4 chronic kidney disease, or unspecified chronic kidney disease: Secondary | ICD-10-CM | POA: Diagnosis not present

## 2021-03-08 DIAGNOSIS — K219 Gastro-esophageal reflux disease without esophagitis: Secondary | ICD-10-CM | POA: Diagnosis not present

## 2021-03-09 DIAGNOSIS — F0150 Vascular dementia without behavioral disturbance: Secondary | ICD-10-CM | POA: Diagnosis not present

## 2021-03-09 DIAGNOSIS — I69318 Other symptoms and signs involving cognitive functions following cerebral infarction: Secondary | ICD-10-CM | POA: Diagnosis not present

## 2021-03-09 DIAGNOSIS — I129 Hypertensive chronic kidney disease with stage 1 through stage 4 chronic kidney disease, or unspecified chronic kidney disease: Secondary | ICD-10-CM | POA: Diagnosis not present

## 2021-03-09 DIAGNOSIS — Z20828 Contact with and (suspected) exposure to other viral communicable diseases: Secondary | ICD-10-CM | POA: Diagnosis not present

## 2021-03-09 DIAGNOSIS — N189 Chronic kidney disease, unspecified: Secondary | ICD-10-CM | POA: Diagnosis not present

## 2021-03-09 DIAGNOSIS — C50912 Malignant neoplasm of unspecified site of left female breast: Secondary | ICD-10-CM | POA: Diagnosis not present

## 2021-03-09 DIAGNOSIS — E785 Hyperlipidemia, unspecified: Secondary | ICD-10-CM | POA: Diagnosis not present

## 2021-03-11 DIAGNOSIS — E785 Hyperlipidemia, unspecified: Secondary | ICD-10-CM | POA: Diagnosis not present

## 2021-03-11 DIAGNOSIS — I69318 Other symptoms and signs involving cognitive functions following cerebral infarction: Secondary | ICD-10-CM | POA: Diagnosis not present

## 2021-03-11 DIAGNOSIS — N189 Chronic kidney disease, unspecified: Secondary | ICD-10-CM | POA: Diagnosis not present

## 2021-03-11 DIAGNOSIS — C50912 Malignant neoplasm of unspecified site of left female breast: Secondary | ICD-10-CM | POA: Diagnosis not present

## 2021-03-11 DIAGNOSIS — F0150 Vascular dementia without behavioral disturbance: Secondary | ICD-10-CM | POA: Diagnosis not present

## 2021-03-11 DIAGNOSIS — I129 Hypertensive chronic kidney disease with stage 1 through stage 4 chronic kidney disease, or unspecified chronic kidney disease: Secondary | ICD-10-CM | POA: Diagnosis not present

## 2021-03-14 DIAGNOSIS — F0150 Vascular dementia without behavioral disturbance: Secondary | ICD-10-CM | POA: Diagnosis not present

## 2021-03-14 DIAGNOSIS — I129 Hypertensive chronic kidney disease with stage 1 through stage 4 chronic kidney disease, or unspecified chronic kidney disease: Secondary | ICD-10-CM | POA: Diagnosis not present

## 2021-03-14 DIAGNOSIS — I69318 Other symptoms and signs involving cognitive functions following cerebral infarction: Secondary | ICD-10-CM | POA: Diagnosis not present

## 2021-03-14 DIAGNOSIS — E785 Hyperlipidemia, unspecified: Secondary | ICD-10-CM | POA: Diagnosis not present

## 2021-03-14 DIAGNOSIS — N189 Chronic kidney disease, unspecified: Secondary | ICD-10-CM | POA: Diagnosis not present

## 2021-03-14 DIAGNOSIS — C50912 Malignant neoplasm of unspecified site of left female breast: Secondary | ICD-10-CM | POA: Diagnosis not present

## 2021-03-16 DIAGNOSIS — E785 Hyperlipidemia, unspecified: Secondary | ICD-10-CM | POA: Diagnosis not present

## 2021-03-16 DIAGNOSIS — I129 Hypertensive chronic kidney disease with stage 1 through stage 4 chronic kidney disease, or unspecified chronic kidney disease: Secondary | ICD-10-CM | POA: Diagnosis not present

## 2021-03-16 DIAGNOSIS — N189 Chronic kidney disease, unspecified: Secondary | ICD-10-CM | POA: Diagnosis not present

## 2021-03-16 DIAGNOSIS — F0150 Vascular dementia without behavioral disturbance: Secondary | ICD-10-CM | POA: Diagnosis not present

## 2021-03-16 DIAGNOSIS — I69318 Other symptoms and signs involving cognitive functions following cerebral infarction: Secondary | ICD-10-CM | POA: Diagnosis not present

## 2021-03-16 DIAGNOSIS — Z20828 Contact with and (suspected) exposure to other viral communicable diseases: Secondary | ICD-10-CM | POA: Diagnosis not present

## 2021-03-16 DIAGNOSIS — C50912 Malignant neoplasm of unspecified site of left female breast: Secondary | ICD-10-CM | POA: Diagnosis not present

## 2021-03-17 DIAGNOSIS — E785 Hyperlipidemia, unspecified: Secondary | ICD-10-CM | POA: Diagnosis not present

## 2021-03-17 DIAGNOSIS — N189 Chronic kidney disease, unspecified: Secondary | ICD-10-CM | POA: Diagnosis not present

## 2021-03-17 DIAGNOSIS — F0150 Vascular dementia without behavioral disturbance: Secondary | ICD-10-CM | POA: Diagnosis not present

## 2021-03-17 DIAGNOSIS — I69318 Other symptoms and signs involving cognitive functions following cerebral infarction: Secondary | ICD-10-CM | POA: Diagnosis not present

## 2021-03-17 DIAGNOSIS — C50912 Malignant neoplasm of unspecified site of left female breast: Secondary | ICD-10-CM | POA: Diagnosis not present

## 2021-03-17 DIAGNOSIS — I129 Hypertensive chronic kidney disease with stage 1 through stage 4 chronic kidney disease, or unspecified chronic kidney disease: Secondary | ICD-10-CM | POA: Diagnosis not present

## 2021-03-18 DIAGNOSIS — E785 Hyperlipidemia, unspecified: Secondary | ICD-10-CM | POA: Diagnosis not present

## 2021-03-18 DIAGNOSIS — C50912 Malignant neoplasm of unspecified site of left female breast: Secondary | ICD-10-CM | POA: Diagnosis not present

## 2021-03-18 DIAGNOSIS — N189 Chronic kidney disease, unspecified: Secondary | ICD-10-CM | POA: Diagnosis not present

## 2021-03-18 DIAGNOSIS — I69318 Other symptoms and signs involving cognitive functions following cerebral infarction: Secondary | ICD-10-CM | POA: Diagnosis not present

## 2021-03-18 DIAGNOSIS — F0150 Vascular dementia without behavioral disturbance: Secondary | ICD-10-CM | POA: Diagnosis not present

## 2021-03-18 DIAGNOSIS — I129 Hypertensive chronic kidney disease with stage 1 through stage 4 chronic kidney disease, or unspecified chronic kidney disease: Secondary | ICD-10-CM | POA: Diagnosis not present

## 2021-03-21 ENCOUNTER — Telehealth: Payer: Self-pay | Admitting: Internal Medicine

## 2021-03-21 NOTE — Telephone Encounter (Signed)
Pat w/ [facility] calling to check status of death certificate  Please advise

## 2021-03-21 NOTE — Telephone Encounter (Signed)
Rec'd notification of hospice death. Date of death 03-29-2021 & Time of Death 3:24am../lmb

## 2021-03-22 NOTE — Telephone Encounter (Signed)
It is done.  Thanks 

## 2021-04-07 DEATH — deceased
